# Patient Record
Sex: Female | Born: 1935 | Race: White | Hispanic: No | Marital: Married | Smoking: Never smoker
Health system: Southern US, Community
[De-identification: ages and names within clinical notes are randomized; demographics above are authoritative.]

## PROBLEM LIST (undated history)

## (undated) DIAGNOSIS — M1990 Unspecified osteoarthritis, unspecified site: Secondary | ICD-10-CM

## (undated) DIAGNOSIS — K589 Irritable bowel syndrome without diarrhea: Secondary | ICD-10-CM

## (undated) DIAGNOSIS — H3530 Unspecified macular degeneration: Secondary | ICD-10-CM

## (undated) DIAGNOSIS — I10 Essential (primary) hypertension: Secondary | ICD-10-CM

## (undated) DIAGNOSIS — E039 Hypothyroidism, unspecified: Secondary | ICD-10-CM

## (undated) DIAGNOSIS — E785 Hyperlipidemia, unspecified: Secondary | ICD-10-CM

## (undated) DIAGNOSIS — K219 Gastro-esophageal reflux disease without esophagitis: Secondary | ICD-10-CM

## (undated) DIAGNOSIS — Z8719 Personal history of other diseases of the digestive system: Secondary | ICD-10-CM

## (undated) HISTORY — PX: EYE SURGERY: SHX253

## (undated) HISTORY — PX: CATARACT EXTRACTION: SUR2

## (undated) HISTORY — DX: Unspecified macular degeneration: H35.30

## (undated) HISTORY — PX: THYROID SURGERY: SHX805

## (undated) HISTORY — PX: FRACTURE SURGERY: SHX138

---

## 1999-11-27 ENCOUNTER — Other Ambulatory Visit: Admission: RE | Admit: 1999-11-27 | Discharge: 1999-11-27 | Payer: Self-pay | Admitting: *Deleted

## 2001-02-15 ENCOUNTER — Other Ambulatory Visit: Admission: RE | Admit: 2001-02-15 | Discharge: 2001-02-15 | Payer: Self-pay | Admitting: *Deleted

## 2003-02-09 ENCOUNTER — Other Ambulatory Visit: Admission: RE | Admit: 2003-02-09 | Discharge: 2003-02-09 | Payer: Self-pay | Admitting: Family Medicine

## 2004-02-12 ENCOUNTER — Other Ambulatory Visit: Admission: RE | Admit: 2004-02-12 | Discharge: 2004-02-12 | Payer: Self-pay | Admitting: Family Medicine

## 2004-02-26 ENCOUNTER — Encounter: Admission: RE | Admit: 2004-02-26 | Discharge: 2004-02-26 | Payer: Self-pay | Admitting: Family Medicine

## 2004-04-11 ENCOUNTER — Ambulatory Visit (HOSPITAL_COMMUNITY): Admission: RE | Admit: 2004-04-11 | Discharge: 2004-04-11 | Payer: Self-pay | Admitting: Gastroenterology

## 2004-05-06 ENCOUNTER — Encounter: Admission: RE | Admit: 2004-05-06 | Discharge: 2004-05-06 | Payer: Self-pay | Admitting: Gastroenterology

## 2005-05-08 ENCOUNTER — Encounter: Admission: RE | Admit: 2005-05-08 | Discharge: 2005-05-08 | Payer: Self-pay | Admitting: Gastroenterology

## 2006-09-24 ENCOUNTER — Encounter: Admission: RE | Admit: 2006-09-24 | Discharge: 2006-09-24 | Payer: Self-pay | Admitting: Gastroenterology

## 2008-01-10 ENCOUNTER — Encounter: Admission: RE | Admit: 2008-01-10 | Discharge: 2008-01-10 | Payer: Self-pay | Admitting: Family Medicine

## 2009-11-23 ENCOUNTER — Inpatient Hospital Stay (HOSPITAL_COMMUNITY): Admission: EM | Admit: 2009-11-23 | Discharge: 2009-11-25 | Payer: Self-pay | Admitting: Emergency Medicine

## 2010-05-14 ENCOUNTER — Encounter: Admission: RE | Admit: 2010-05-14 | Discharge: 2010-05-14 | Payer: Self-pay | Admitting: Orthopedic Surgery

## 2011-02-02 LAB — COMPREHENSIVE METABOLIC PANEL
ALT: 127 U/L — ABNORMAL HIGH (ref 0–35)
AST: 89 U/L — ABNORMAL HIGH (ref 0–37)
Albumin: 3.9 g/dL (ref 3.5–5.2)
Alkaline Phosphatase: 58 U/L (ref 39–117)
BUN: 8 mg/dL (ref 6–23)
CO2: 30 mEq/L (ref 19–32)
Calcium: 8.6 mg/dL (ref 8.4–10.5)
Chloride: 83 mEq/L — ABNORMAL LOW (ref 96–112)
Creatinine, Ser: 0.87 mg/dL (ref 0.4–1.2)
GFR calc Af Amer: >60 mL/min (ref >60)
GFR calc non Af Amer: >60 mL/min (ref >60)
Glucose, Bld: 122 mg/dL — ABNORMAL HIGH (ref 70–99)
Potassium: 2.9 mEq/L — ABNORMAL LOW (ref 3.5–5.1)
Sodium: 122 mEq/L — ABNORMAL LOW (ref 135–145)
Total Bilirubin: 0.8 mg/dL (ref 0.3–1.2)
Total Protein: 7.4 g/dL (ref 6.0–8.3)

## 2011-02-02 LAB — URINALYSIS, ROUTINE W REFLEX MICROSCOPIC
Bilirubin Urine: NEGATIVE
Glucose, UA: NEGATIVE mg/dL
Hgb urine dipstick: NEGATIVE
Ketones, ur: NEGATIVE mg/dL
Nitrite: NEGATIVE
Protein, ur: NEGATIVE mg/dL
Specific Gravity, Urine: 1.005 (ref 1.005–1.030)
Urobilinogen, UA: 0.2 mg/dL (ref 0.0–1.0)
pH: 7 (ref 5.0–8.0)

## 2011-02-02 LAB — BASIC METABOLIC PANEL
BUN: 4 mg/dL — ABNORMAL LOW (ref 6–23)
CO2: 28 mEq/L (ref 19–32)
Calcium: 7.1 mg/dL — ABNORMAL LOW (ref 8.4–10.5)
Chloride: 105 mEq/L (ref 96–112)
Creatinine, Ser: 0.75 mg/dL (ref 0.4–1.2)
GFR calc Af Amer: >60 mL/min (ref >60)
GFR calc non Af Amer: >60 mL/min (ref >60)
Glucose, Bld: 103 mg/dL — ABNORMAL HIGH (ref 70–99)
Potassium: 4.9 mEq/L (ref 3.5–5.1)
Sodium: 134 mEq/L — ABNORMAL LOW (ref 135–145)

## 2011-02-02 LAB — CBC
HCT: 43.9 % (ref 36.0–46.0)
Hemoglobin: 15.2 g/dL — ABNORMAL HIGH (ref 12.0–15.0)
MCHC: 34.5 g/dL (ref 30.0–36.0)
MCV: 92.5 fL (ref 78.0–100.0)
Platelets: 162 10*3/uL (ref 150–400)
RBC: 4.75 MIL/uL (ref 3.87–5.11)
RDW: 12.6 % (ref 11.5–15.5)
WBC: 4.2 10*3/uL (ref 4.0–10.5)

## 2011-02-02 LAB — DIFFERENTIAL
Basophils Absolute: 0 10*3/uL (ref 0.0–0.1)
Basophils Relative: 1 % (ref 0–1)
Eosinophils Absolute: 0 10*3/uL (ref 0.0–0.7)
Eosinophils Relative: 0 % (ref 0–5)
Lymphocytes Relative: 20 % (ref 12–46)
Lymphs Abs: 0.8 10*3/uL (ref 0.7–4.0)
Monocytes Absolute: 0.7 10*3/uL (ref 0.1–1.0)
Monocytes Relative: 16 % — ABNORMAL HIGH (ref 3–12)
Neutro Abs: 2.7 10*3/uL (ref 1.7–7.7)
Neutrophils Relative %: 63 % (ref 43–77)

## 2011-02-02 LAB — MAGNESIUM: Magnesium: 2 mg/dL (ref 1.5–2.5)

## 2011-04-04 NOTE — Op Note (Signed)
Tammy Allison, Tammy Allison                       ACCOUNT NO.:  [id number]   MEDICAL RECORD NO.:  [id number]                   PATIENT TYPE:  AMB   LOCATION:  ENDO                                 FACILITY:  [facility]   PHYSICIAN:  Graylin Shiver, M.D.                DATE OF BIRTH:  Oct 29, 1935   DATE OF PROCEDURE:  04/11/2004  DATE OF DISCHARGE:                                 OPERATIVE REPORT   PROCEDURE:  Flexible sigmoidoscopy.   INDICATIONS FOR PROCEDURE:  Screening.   CONSENT:  Informed consent was obtained after explanation of the risks of  bleeding, infection, and perforation.   PREMEDICATION:  Fentanyl 75 mcg IV, Versed 7.5 mg IV.   PROCEDURE IN DETAIL:  With the patient in the left lateral decubitus  position, a rectal exam was performed and no masses were felt.  The Olympus  pediatric adjustable colonoscope was inserted into the rectum and advanced  into the sigmoid colon.  The sigmoid was extremely tortuous and fixed.  I  could not advance the scope beyond 25 cm.  The patient was rolled on her  back and pressure was applied to the sigmoid area.  I could not advance the  scope any further.  The colonoscope was removed.  I then tried a gastroscope  but cold not advance the scope beyond 25 cm in the sigmoid.  Some  diverticula were seen.  The sigmoid is tortuous and fixed.  Other than  diverticula, no other abnormalities were noted.  She tolerated the procedure  well without complications.   IMPRESSION:  Flexible sigmoidoscopy to 25 cm revealed diverticulosis.  The  sigmoid is fixed and tortuous.  The scope cannot be advanced beyond 25 cm.   PLAN:  Barium enema to evaluate the rest of the colon.                                               Graylin Shiver, M.D.    Germain Osgood  D:  04/11/2004  T:  04/11/2004  Job:  [id number]

## 2011-04-04 NOTE — Op Note (Signed)
NAMERAJANAE, MANTIA             ACCOUNT NO.:  [id number]   MEDICAL RECORD NO.:  [id number]          PATIENT TYPE:  AMB   LOCATION:  ENDO                         FACILITY:  [facility]   PHYSICIAN:  Kathlee Nations Trigt III, M.D.DATE OF BIRTH:  04-02-1935   DATE OF PROCEDURE:  10/07/2004  DATE OF DISCHARGE:  04/11/2004                                 OPERATIVE REPORT   OPERATION:  Placement of left chest tube.   PREOPERATIVE DIAGNOSIS:  Recurrent pneumothorax following placement of a  left chest DART catheter for a large pneumothorax on November 19.   POSTOPERATIVE DIAGNOSIS:  Recurrent pneumothorax following placement of a  left chest DART catheter for a large pneumothorax on November 19.   SURGEON:  Kerin Perna, M.D.   ANESTHESIA:  Local 1% lidocaine.   INDICATIONS FOR PROCEDURE:  The patient is a 76 year old female who had an ICD  placed on November 18.  His initial postoperative x-ray showed good lead  placement without pneumothorax.  His chest x-ray the next morning showed a  90% left pneumothorax from which he was symptomatic.  An anterior DART  catheter was placed with full re-expansion of the lung and minimal air leak.  Yesterday, the small catheter was placed to water-seal inferiorly but he  then became symptomatic with shortness of breath.  He was placed back to  suction with improved symptoms, however, a chest x-ray this morning showed a  recurrent inferior and superior pneumothorax of approximately 25%.  For that  reason, it was felt a wider chest tube would be necessary to evacuate the  pneumothorax and to resolve the air leak.  I discussed the situation with  the patient and his wife and they understood and agreed to proceed under  informed consent.   PROCEDURE:  The previously placed DART catheter on the anterior chest was  prepped and removed.  The anterolateral left chest was then prepped and  draped as a sterile field.  1% lidocaine was infiltrated in the fifth  anterior intercostal space.  A small incision was made of 1 cm and a  subcutaneous tunnel was tracked to the intercostal muscle.  The intercostal  muscle was infiltrated with 1% lidocaine.  A hemostat was used to gently  enter the pleural space which was confirmed by the rush of air externally.  Through the tunnel, a 20 French chest tube was placed and directed laterally  and anteriorly to the apex.  This was connected to the PleurVac suction  drainage system and there was good air fluctuation.  The catheter was  sutured in place with two silk sutures and a sterile dressing was applied.  A portable chest x-ray is pending.      Pete   PV/MEDQ  D:  10/07/2004  T:  10/07/2004  Job:  [id number]

## 2011-10-06 ENCOUNTER — Other Ambulatory Visit: Payer: Self-pay | Admitting: Gastroenterology

## 2011-10-27 ENCOUNTER — Other Ambulatory Visit: Payer: Self-pay

## 2011-10-28 ENCOUNTER — Ambulatory Visit
Admission: RE | Admit: 2011-10-28 | Discharge: 2011-10-28 | Disposition: A | Discharge status: Home / Self Care | Payer: Medicare Other | Source: Ambulatory Visit | Attending: Gastroenterology | Admitting: Gastroenterology

## 2011-12-11 DIAGNOSIS — Z1231 Encounter for screening mammogram for malignant neoplasm of breast: Secondary | ICD-10-CM | POA: Diagnosis not present

## 2012-01-12 DIAGNOSIS — H353290 Exudative age-related macular degeneration, unspecified eye, stage unspecified: Secondary | ICD-10-CM | POA: Diagnosis not present

## 2012-01-12 DIAGNOSIS — H2510 Age-related nuclear cataract, unspecified eye: Secondary | ICD-10-CM | POA: Diagnosis not present

## 2012-01-12 DIAGNOSIS — H43399 Other vitreous opacities, unspecified eye: Secondary | ICD-10-CM | POA: Diagnosis not present

## 2012-01-12 DIAGNOSIS — H40039 Anatomical narrow angle, unspecified eye: Secondary | ICD-10-CM | POA: Diagnosis not present

## 2012-03-02 DIAGNOSIS — J309 Allergic rhinitis, unspecified: Secondary | ICD-10-CM | POA: Diagnosis not present

## 2012-03-02 DIAGNOSIS — I10 Essential (primary) hypertension: Secondary | ICD-10-CM | POA: Diagnosis not present

## 2012-03-02 DIAGNOSIS — E039 Hypothyroidism, unspecified: Secondary | ICD-10-CM | POA: Diagnosis not present

## 2012-03-02 DIAGNOSIS — E7800 Pure hypercholesterolemia, unspecified: Secondary | ICD-10-CM | POA: Diagnosis not present

## 2012-04-13 DIAGNOSIS — R55 Syncope and collapse: Secondary | ICD-10-CM | POA: Diagnosis not present

## 2012-04-20 DIAGNOSIS — I10 Essential (primary) hypertension: Secondary | ICD-10-CM | POA: Diagnosis not present

## 2012-04-20 DIAGNOSIS — E7800 Pure hypercholesterolemia, unspecified: Secondary | ICD-10-CM | POA: Diagnosis not present

## 2012-04-20 DIAGNOSIS — R55 Syncope and collapse: Secondary | ICD-10-CM | POA: Diagnosis not present

## 2012-05-04 DIAGNOSIS — R002 Palpitations: Secondary | ICD-10-CM | POA: Diagnosis not present

## 2012-05-04 DIAGNOSIS — M7130 Other bursal cyst, unspecified site: Secondary | ICD-10-CM | POA: Diagnosis not present

## 2012-07-12 DIAGNOSIS — H40039 Anatomical narrow angle, unspecified eye: Secondary | ICD-10-CM | POA: Diagnosis not present

## 2012-08-30 DIAGNOSIS — M949 Disorder of cartilage, unspecified: Secondary | ICD-10-CM | POA: Diagnosis not present

## 2012-08-30 DIAGNOSIS — I10 Essential (primary) hypertension: Secondary | ICD-10-CM | POA: Diagnosis not present

## 2012-08-30 DIAGNOSIS — E039 Hypothyroidism, unspecified: Secondary | ICD-10-CM | POA: Diagnosis not present

## 2012-08-30 DIAGNOSIS — E7800 Pure hypercholesterolemia, unspecified: Secondary | ICD-10-CM | POA: Diagnosis not present

## 2012-08-30 DIAGNOSIS — J309 Allergic rhinitis, unspecified: Secondary | ICD-10-CM | POA: Diagnosis not present

## 2012-08-30 DIAGNOSIS — K219 Gastro-esophageal reflux disease without esophagitis: Secondary | ICD-10-CM | POA: Diagnosis not present

## 2012-08-30 DIAGNOSIS — M899 Disorder of bone, unspecified: Secondary | ICD-10-CM | POA: Diagnosis not present

## 2012-12-23 DIAGNOSIS — Z1231 Encounter for screening mammogram for malignant neoplasm of breast: Secondary | ICD-10-CM | POA: Diagnosis not present

## 2013-01-13 DIAGNOSIS — H40039 Anatomical narrow angle, unspecified eye: Secondary | ICD-10-CM | POA: Diagnosis not present

## 2013-01-13 DIAGNOSIS — H04129 Dry eye syndrome of unspecified lacrimal gland: Secondary | ICD-10-CM | POA: Diagnosis not present

## 2013-01-13 DIAGNOSIS — H2510 Age-related nuclear cataract, unspecified eye: Secondary | ICD-10-CM | POA: Diagnosis not present

## 2013-01-13 DIAGNOSIS — H43819 Vitreous degeneration, unspecified eye: Secondary | ICD-10-CM | POA: Diagnosis not present

## 2013-01-13 DIAGNOSIS — H353190 Nonexudative age-related macular degeneration, unspecified eye, stage unspecified: Secondary | ICD-10-CM | POA: Diagnosis not present

## 2013-02-28 DIAGNOSIS — E039 Hypothyroidism, unspecified: Secondary | ICD-10-CM | POA: Diagnosis not present

## 2013-02-28 DIAGNOSIS — I10 Essential (primary) hypertension: Secondary | ICD-10-CM | POA: Diagnosis not present

## 2013-02-28 DIAGNOSIS — E7800 Pure hypercholesterolemia, unspecified: Secondary | ICD-10-CM | POA: Diagnosis not present

## 2013-02-28 DIAGNOSIS — J309 Allergic rhinitis, unspecified: Secondary | ICD-10-CM | POA: Diagnosis not present

## 2013-02-28 DIAGNOSIS — K219 Gastro-esophageal reflux disease without esophagitis: Secondary | ICD-10-CM | POA: Diagnosis not present

## 2013-03-23 DIAGNOSIS — H1045 Other chronic allergic conjunctivitis: Secondary | ICD-10-CM | POA: Diagnosis not present

## 2013-03-23 DIAGNOSIS — H00019 Hordeolum externum unspecified eye, unspecified eyelid: Secondary | ICD-10-CM | POA: Diagnosis not present

## 2013-04-04 DIAGNOSIS — H40039 Anatomical narrow angle, unspecified eye: Secondary | ICD-10-CM | POA: Diagnosis not present

## 2013-04-04 DIAGNOSIS — H1045 Other chronic allergic conjunctivitis: Secondary | ICD-10-CM | POA: Diagnosis not present

## 2013-04-04 DIAGNOSIS — H04129 Dry eye syndrome of unspecified lacrimal gland: Secondary | ICD-10-CM | POA: Diagnosis not present

## 2013-04-21 DIAGNOSIS — E039 Hypothyroidism, unspecified: Secondary | ICD-10-CM | POA: Diagnosis not present

## 2013-04-28 DIAGNOSIS — L508 Other urticaria: Secondary | ICD-10-CM | POA: Diagnosis not present

## 2013-05-25 DIAGNOSIS — R059 Cough, unspecified: Secondary | ICD-10-CM | POA: Diagnosis not present

## 2013-05-25 DIAGNOSIS — R05 Cough: Secondary | ICD-10-CM | POA: Diagnosis not present

## 2013-05-25 DIAGNOSIS — R0789 Other chest pain: Secondary | ICD-10-CM | POA: Diagnosis not present

## 2013-05-25 DIAGNOSIS — R0989 Other specified symptoms and signs involving the circulatory and respiratory systems: Secondary | ICD-10-CM | POA: Diagnosis not present

## 2013-05-25 DIAGNOSIS — R0609 Other forms of dyspnea: Secondary | ICD-10-CM | POA: Diagnosis not present

## 2013-06-06 DIAGNOSIS — R0989 Other specified symptoms and signs involving the circulatory and respiratory systems: Secondary | ICD-10-CM | POA: Diagnosis not present

## 2013-06-06 DIAGNOSIS — R0609 Other forms of dyspnea: Secondary | ICD-10-CM | POA: Diagnosis not present

## 2013-06-06 DIAGNOSIS — R0789 Other chest pain: Secondary | ICD-10-CM | POA: Diagnosis not present

## 2013-06-16 DIAGNOSIS — L509 Urticaria, unspecified: Secondary | ICD-10-CM | POA: Diagnosis not present

## 2013-08-04 DIAGNOSIS — H40039 Anatomical narrow angle, unspecified eye: Secondary | ICD-10-CM | POA: Diagnosis not present

## 2013-08-04 DIAGNOSIS — H04129 Dry eye syndrome of unspecified lacrimal gland: Secondary | ICD-10-CM | POA: Diagnosis not present

## 2013-08-04 DIAGNOSIS — H1045 Other chronic allergic conjunctivitis: Secondary | ICD-10-CM | POA: Diagnosis not present

## 2013-08-04 DIAGNOSIS — H18519 Endothelial corneal dystrophy, unspecified eye: Secondary | ICD-10-CM | POA: Diagnosis not present

## 2013-09-01 DIAGNOSIS — I10 Essential (primary) hypertension: Secondary | ICD-10-CM | POA: Diagnosis not present

## 2013-09-01 DIAGNOSIS — R197 Diarrhea, unspecified: Secondary | ICD-10-CM | POA: Diagnosis not present

## 2013-09-01 DIAGNOSIS — K219 Gastro-esophageal reflux disease without esophagitis: Secondary | ICD-10-CM | POA: Diagnosis not present

## 2013-09-01 DIAGNOSIS — E7800 Pure hypercholesterolemia, unspecified: Secondary | ICD-10-CM | POA: Diagnosis not present

## 2013-09-01 DIAGNOSIS — E039 Hypothyroidism, unspecified: Secondary | ICD-10-CM | POA: Diagnosis not present

## 2013-12-26 DIAGNOSIS — Z1231 Encounter for screening mammogram for malignant neoplasm of breast: Secondary | ICD-10-CM | POA: Diagnosis not present

## 2014-01-20 DIAGNOSIS — H524 Presbyopia: Secondary | ICD-10-CM | POA: Diagnosis not present

## 2014-01-20 DIAGNOSIS — H353190 Nonexudative age-related macular degeneration, unspecified eye, stage unspecified: Secondary | ICD-10-CM | POA: Diagnosis not present

## 2014-01-20 DIAGNOSIS — H18519 Endothelial corneal dystrophy, unspecified eye: Secondary | ICD-10-CM | POA: Diagnosis not present

## 2014-01-20 DIAGNOSIS — H2510 Age-related nuclear cataract, unspecified eye: Secondary | ICD-10-CM | POA: Diagnosis not present

## 2014-01-20 DIAGNOSIS — H5703 Miosis: Secondary | ICD-10-CM | POA: Diagnosis not present

## 2014-01-20 DIAGNOSIS — H25019 Cortical age-related cataract, unspecified eye: Secondary | ICD-10-CM | POA: Diagnosis not present

## 2014-03-02 DIAGNOSIS — E039 Hypothyroidism, unspecified: Secondary | ICD-10-CM | POA: Diagnosis not present

## 2014-03-02 DIAGNOSIS — Z23 Encounter for immunization: Secondary | ICD-10-CM | POA: Diagnosis not present

## 2014-03-02 DIAGNOSIS — E7800 Pure hypercholesterolemia, unspecified: Secondary | ICD-10-CM | POA: Diagnosis not present

## 2014-03-02 DIAGNOSIS — M949 Disorder of cartilage, unspecified: Secondary | ICD-10-CM | POA: Diagnosis not present

## 2014-03-02 DIAGNOSIS — M899 Disorder of bone, unspecified: Secondary | ICD-10-CM | POA: Diagnosis not present

## 2014-03-02 DIAGNOSIS — I10 Essential (primary) hypertension: Secondary | ICD-10-CM | POA: Diagnosis not present

## 2014-03-02 DIAGNOSIS — J309 Allergic rhinitis, unspecified: Secondary | ICD-10-CM | POA: Diagnosis not present

## 2014-03-21 DIAGNOSIS — H2510 Age-related nuclear cataract, unspecified eye: Secondary | ICD-10-CM | POA: Diagnosis not present

## 2014-03-21 DIAGNOSIS — H269 Unspecified cataract: Secondary | ICD-10-CM | POA: Diagnosis not present

## 2014-04-18 DIAGNOSIS — M25559 Pain in unspecified hip: Secondary | ICD-10-CM | POA: Diagnosis not present

## 2014-04-19 DIAGNOSIS — H2510 Age-related nuclear cataract, unspecified eye: Secondary | ICD-10-CM | POA: Diagnosis not present

## 2014-04-19 DIAGNOSIS — H25019 Cortical age-related cataract, unspecified eye: Secondary | ICD-10-CM | POA: Diagnosis not present

## 2014-04-21 ENCOUNTER — Other Ambulatory Visit: Payer: Self-pay | Admitting: Family Medicine

## 2014-04-21 ENCOUNTER — Ambulatory Visit
Admission: RE | Admit: 2014-04-21 | Discharge: 2014-04-21 | Disposition: A | Discharge status: Home / Self Care | Payer: Medicare Other | Source: Ambulatory Visit | Attending: Family Medicine | Admitting: Family Medicine

## 2014-04-21 DIAGNOSIS — M79605 Pain in left leg: Secondary | ICD-10-CM

## 2014-04-21 DIAGNOSIS — M545 Low back pain, unspecified: Secondary | ICD-10-CM

## 2014-04-21 DIAGNOSIS — M25559 Pain in unspecified hip: Secondary | ICD-10-CM | POA: Diagnosis not present

## 2014-05-01 DIAGNOSIS — M545 Low back pain, unspecified: Secondary | ICD-10-CM | POA: Diagnosis not present

## 2014-05-01 DIAGNOSIS — M25559 Pain in unspecified hip: Secondary | ICD-10-CM | POA: Diagnosis not present

## 2014-05-02 DIAGNOSIS — H2510 Age-related nuclear cataract, unspecified eye: Secondary | ICD-10-CM | POA: Diagnosis not present

## 2014-05-02 DIAGNOSIS — H269 Unspecified cataract: Secondary | ICD-10-CM | POA: Diagnosis not present

## 2014-05-05 DIAGNOSIS — M545 Low back pain, unspecified: Secondary | ICD-10-CM | POA: Diagnosis not present

## 2014-05-05 DIAGNOSIS — M25559 Pain in unspecified hip: Secondary | ICD-10-CM | POA: Diagnosis not present

## 2014-05-09 DIAGNOSIS — M545 Low back pain, unspecified: Secondary | ICD-10-CM | POA: Diagnosis not present

## 2014-05-09 DIAGNOSIS — M25559 Pain in unspecified hip: Secondary | ICD-10-CM | POA: Diagnosis not present

## 2014-05-11 DIAGNOSIS — M545 Low back pain, unspecified: Secondary | ICD-10-CM | POA: Diagnosis not present

## 2014-05-11 DIAGNOSIS — M25559 Pain in unspecified hip: Secondary | ICD-10-CM | POA: Diagnosis not present

## 2014-05-12 ENCOUNTER — Ambulatory Visit
Admission: RE | Admit: 2014-05-12 | Discharge: 2014-05-12 | Disposition: A | Discharge status: Home / Self Care | Payer: Medicare Other | Source: Ambulatory Visit | Attending: Family Medicine | Admitting: Family Medicine

## 2014-05-12 ENCOUNTER — Inpatient Hospital Stay (HOSPITAL_COMMUNITY)
Admission: EM | Admit: 2014-05-12 | Discharge: 2014-05-15 | DRG: 641 | Disposition: A | Discharge status: Home Health | Payer: Medicare Other | Attending: Internal Medicine | Admitting: Internal Medicine

## 2014-05-12 ENCOUNTER — Other Ambulatory Visit: Payer: Self-pay | Admitting: Family Medicine

## 2014-05-12 ENCOUNTER — Encounter (HOSPITAL_COMMUNITY): Payer: Self-pay | Admitting: Emergency Medicine

## 2014-05-12 DIAGNOSIS — E785 Hyperlipidemia, unspecified: Secondary | ICD-10-CM | POA: Diagnosis present

## 2014-05-12 DIAGNOSIS — R319 Hematuria, unspecified: Secondary | ICD-10-CM

## 2014-05-12 DIAGNOSIS — E871 Hypo-osmolality and hyponatremia: Principal | ICD-10-CM | POA: Diagnosis present

## 2014-05-12 DIAGNOSIS — K5730 Diverticulosis of large intestine without perforation or abscess without bleeding: Secondary | ICD-10-CM | POA: Diagnosis not present

## 2014-05-12 DIAGNOSIS — E878 Other disorders of electrolyte and fluid balance, not elsewhere classified: Secondary | ICD-10-CM | POA: Diagnosis not present

## 2014-05-12 DIAGNOSIS — M5137 Other intervertebral disc degeneration, lumbosacral region: Secondary | ICD-10-CM | POA: Diagnosis not present

## 2014-05-12 DIAGNOSIS — I10 Essential (primary) hypertension: Secondary | ICD-10-CM | POA: Diagnosis present

## 2014-05-12 DIAGNOSIS — IMO0002 Reserved for concepts with insufficient information to code with codable children: Secondary | ICD-10-CM | POA: Diagnosis present

## 2014-05-12 DIAGNOSIS — E038 Other specified hypothyroidism: Secondary | ICD-10-CM

## 2014-05-12 DIAGNOSIS — R110 Nausea: Secondary | ICD-10-CM | POA: Diagnosis not present

## 2014-05-12 DIAGNOSIS — E876 Hypokalemia: Secondary | ICD-10-CM | POA: Diagnosis present

## 2014-05-12 DIAGNOSIS — M549 Dorsalgia, unspecified: Secondary | ICD-10-CM | POA: Diagnosis not present

## 2014-05-12 DIAGNOSIS — M5417 Radiculopathy, lumbosacral region: Secondary | ICD-10-CM | POA: Diagnosis present

## 2014-05-12 DIAGNOSIS — M5126 Other intervertebral disc displacement, lumbar region: Secondary | ICD-10-CM | POA: Diagnosis not present

## 2014-05-12 DIAGNOSIS — R109 Unspecified abdominal pain: Secondary | ICD-10-CM

## 2014-05-12 DIAGNOSIS — E039 Hypothyroidism, unspecified: Secondary | ICD-10-CM | POA: Diagnosis present

## 2014-05-12 DIAGNOSIS — R82998 Other abnormal findings in urine: Secondary | ICD-10-CM | POA: Diagnosis not present

## 2014-05-12 HISTORY — DX: Hyperlipidemia, unspecified: E78.5

## 2014-05-12 HISTORY — DX: Essential (primary) hypertension: I10

## 2014-05-12 LAB — COMPREHENSIVE METABOLIC PANEL
ALT: 16 U/L (ref 0–35)
AST: 19 U/L (ref 0–37)
Albumin: 3.5 g/dL (ref 3.5–5.2)
Alkaline Phosphatase: 48 U/L (ref 39–117)
BUN: 8 mg/dL (ref 6–23)
CO2: 31 mEq/L (ref 19–32)
Calcium: 8.5 mg/dL (ref 8.4–10.5)
Chloride: 76 mEq/L — ABNORMAL LOW (ref 96–112)
Creatinine, Ser: 0.69 mg/dL (ref 0.50–1.10)
GFR calc Af Amer: >90 mL/min (ref >90)
GFR calc non Af Amer: 82 mL/min — ABNORMAL LOW (ref >90)
Glucose, Bld: 122 mg/dL — ABNORMAL HIGH (ref 70–99)
Potassium: <2.2 mEq/L — CL (ref 3.7–5.3)
Sodium: 118 mEq/L — CL (ref 137–147)
Total Bilirubin: 1 mg/dL (ref 0.3–1.2)
Total Protein: 6.3 g/dL (ref 6.0–8.3)

## 2014-05-12 LAB — CBC WITH DIFFERENTIAL/PLATELET
Basophils Absolute: 0 10*3/uL (ref 0.0–0.1)
Basophils Relative: 0 % (ref 0–1)
Eosinophils Absolute: 0 10*3/uL (ref 0.0–0.7)
Eosinophils Relative: 0 % (ref 0–5)
HCT: 38.1 % (ref 36.0–46.0)
Hemoglobin: 13.9 g/dL (ref 12.0–15.0)
Lymphocytes Relative: 17 % (ref 12–46)
Lymphs Abs: 1.4 10*3/uL (ref 0.7–4.0)
MCH: 31 pg (ref 26.0–34.0)
MCHC: 36.5 g/dL — ABNORMAL HIGH (ref 30.0–36.0)
MCV: 85 fL (ref 78.0–100.0)
Monocytes Absolute: 0.9 10*3/uL (ref 0.1–1.0)
Monocytes Relative: 11 % (ref 3–12)
Neutro Abs: 5.9 10*3/uL (ref 1.7–7.7)
Neutrophils Relative %: 72 % (ref 43–77)
Platelets: 186 10*3/uL (ref 150–400)
RBC: 4.48 MIL/uL (ref 3.87–5.11)
RDW: 12.4 % (ref 11.5–15.5)
WBC: 8.1 10*3/uL (ref 4.0–10.5)

## 2014-05-12 LAB — MAGNESIUM: Magnesium: 1.5 mg/dL (ref 1.5–2.5)

## 2014-05-12 MED ORDER — ALUM & MAG HYDROXIDE-SIMETH 200-200-20 MG/5ML PO SUSP: 30.0000 mL | Freq: Four times a day (QID) | ORAL | Status: DC | PRN

## 2014-05-12 MED ORDER — POTASSIUM CHLORIDE 10 MEQ/100ML IV SOLN
10.0000 meq | INTRAVENOUS | Status: AC
  Administered 2014-05-12 – 2014-05-13 (×6): 10 meq via INTRAVENOUS
  Filled 2014-05-12 (×11): qty 100

## 2014-05-12 MED ORDER — SODIUM CHLORIDE 0.9 % IJ SOLN: 3.0000 mL | Freq: Two times a day (BID) | INTRAMUSCULAR | Status: DC

## 2014-05-12 MED ORDER — SODIUM CHLORIDE 0.9 % IV SOLN: INTRAVENOUS | Status: DC

## 2014-05-12 MED ORDER — IOHEXOL 300 MG/ML  SOLN
100.0000 mL | Freq: Once | INTRAMUSCULAR | Status: AC | PRN
  Administered 2014-05-12: 100 mL via INTRAVENOUS

## 2014-05-12 MED ORDER — ENOXAPARIN SODIUM 30 MG/0.3ML ~~LOC~~ SOLN
30.0000 mg | SUBCUTANEOUS | Status: DC
  Administered 2014-05-12: 30 mg via SUBCUTANEOUS
  Filled 2014-05-12 (×2): qty 0.3

## 2014-05-12 MED ORDER — ONDANSETRON HCL 4 MG/2ML IJ SOLN
4.0000 mg | Freq: Four times a day (QID) | INTRAMUSCULAR | Status: DC | PRN
  Administered 2014-05-13 – 2014-05-15 (×2): 4 mg via INTRAVENOUS
  Filled 2014-05-12 (×2): qty 2

## 2014-05-12 MED ORDER — SODIUM CHLORIDE 0.9 % IV SOLN
INTRAVENOUS | Status: DC
  Administered 2014-05-12: via INTRAVENOUS

## 2014-05-12 MED ORDER — HYDROMORPHONE HCL PF 1 MG/ML IJ SOLN: 0.5000 mg | INTRAMUSCULAR | Status: DC | PRN

## 2014-05-12 MED ORDER — GI COCKTAIL ~~LOC~~
30.0000 mL | Freq: Once | ORAL | Status: AC
  Administered 2014-05-13: 30 mL via ORAL
  Filled 2014-05-12: qty 30

## 2014-05-12 MED ORDER — SIMVASTATIN 20 MG PO TABS
20.0000 mg | ORAL_TABLET | Freq: Every day | ORAL | Status: DC
  Administered 2014-05-12 – 2014-05-14 (×3): 20 mg via ORAL
  Filled 2014-05-12 (×4): qty 1

## 2014-05-12 MED ORDER — POTASSIUM CHLORIDE 10 MEQ/100ML IV SOLN
10.0000 meq | Freq: Once | INTRAVENOUS | Status: AC
  Administered 2014-05-12: 10 meq via INTRAVENOUS
  Filled 2014-05-12: qty 100

## 2014-05-12 MED ORDER — OXYCODONE HCL 5 MG PO TABS
5.0000 mg | ORAL_TABLET | ORAL | Status: DC | PRN
  Administered 2014-05-12 – 2014-05-14 (×3): 5 mg via ORAL
  Filled 2014-05-12 (×3): qty 1

## 2014-05-12 MED ORDER — ACETAMINOPHEN 650 MG RE SUPP: 650.0000 mg | Freq: Four times a day (QID) | RECTAL | Status: DC | PRN

## 2014-05-12 MED ORDER — BENAZEPRIL HCL 20 MG PO TABS
20.0000 mg | ORAL_TABLET | Freq: Every day | ORAL | Status: DC
  Administered 2014-05-13 – 2014-05-15 (×3): 20 mg via ORAL
  Filled 2014-05-12 (×3): qty 1

## 2014-05-12 MED ORDER — LEVOTHYROXINE SODIUM 50 MCG PO TABS
50.0000 ug | ORAL_TABLET | Freq: Every day | ORAL | Status: DC
  Administered 2014-05-13 – 2014-05-15 (×3): 50 ug via ORAL
  Filled 2014-05-12 (×4): qty 1

## 2014-05-12 MED ORDER — ACETAMINOPHEN 325 MG PO TABS
650.0000 mg | ORAL_TABLET | Freq: Four times a day (QID) | ORAL | Status: DC | PRN
  Administered 2014-05-15 (×2): 650 mg via ORAL
  Filled 2014-05-12 (×2): qty 2

## 2014-05-12 MED ORDER — ONDANSETRON HCL 4 MG/2ML IJ SOLN: 4.0000 mg | Freq: Three times a day (TID) | INTRAMUSCULAR | Status: AC | PRN

## 2014-05-12 MED ORDER — ONDANSETRON HCL 4 MG PO TABS: 4.0000 mg | ORAL_TABLET | Freq: Four times a day (QID) | ORAL | Status: DC | PRN

## 2014-05-12 MED ORDER — OMEGA-3-ACID ETHYL ESTERS 1 G PO CAPS
1.0000 g | ORAL_CAPSULE | Freq: Two times a day (BID) | ORAL | Status: DC
  Administered 2014-05-12 – 2014-05-15 (×5): 1 g via ORAL
  Filled 2014-05-12 (×7): qty 1

## 2014-05-12 MED ORDER — MAGNESIUM SULFATE IN D5W 10-5 MG/ML-% IV SOLN
1.0000 g | Freq: Once | INTRAVENOUS | Status: AC
  Administered 2014-05-13: 1 g via INTRAVENOUS
  Filled 2014-05-12 (×2): qty 100

## 2014-05-12 MED ORDER — VITAMIN D3 25 MCG (1000 UNIT) PO TABS
1000.0000 [IU] | ORAL_TABLET | Freq: Every day | ORAL | Status: DC
  Administered 2014-05-13 – 2014-05-15 (×3): 1000 [IU] via ORAL
  Filled 2014-05-12 (×3): qty 1

## 2014-05-12 MED ORDER — POTASSIUM CHLORIDE CRYS ER 20 MEQ PO TBCR
40.0000 meq | EXTENDED_RELEASE_TABLET | Freq: Once | ORAL | Status: AC
  Administered 2014-05-12: 40 meq via ORAL
  Filled 2014-05-12: qty 2

## 2014-05-12 NOTE — ED Notes (Signed)
Critical Na+ 118 & K+ <2.2 given to nurse Govan

## 2014-05-12 NOTE — Progress Notes (Signed)
  CARE MANAGEMENT ED NOTE 05/12/2014  Patient:  Tammy Allison, Tammy Allison   Account Number:  [id number]  Date Initiated:  05/12/2014  Documentation initiated by:  Livia Snellen  Subjective/Objective Assessment:   Patient presents to Ed with low potassium and low sodium     Subjective/Objective Assessment Detail:     Action/Plan:   Action/Plan Detail:   Anticipated DC Date:       Status Recommendation to Physician:   Result of Recommendation:    Other ED [facility]  Other  PCP issues    Choice offered to / List presented to:            Status of service:  Completed, signed off  ED Comments:   ED Comments Detail:  EDCM soke to patient at bedside.  Patient confirms her pcp is Dr. [name] of [facility].  System updated.

## 2014-05-12 NOTE — ED Notes (Signed)
MD notified of Critical lab results .

## 2014-05-12 NOTE — ED Notes (Signed)
Pt was sent here from PCP with c/o low potassium and sodium

## 2014-05-12 NOTE — ED Provider Notes (Signed)
CSN: [id number]     Arrival date & time 05/12/14  1752 History   First MD Initiated Contact with Patient 05/12/14 1900     Chief Complaint  Patient presents with  . Abnormal Lab      HPI Patient was sent to emergency room by her PCP for abnormal lab which were done today.  Patient was told that her serum sodium and serum potassium was low.  Patient's had similar admissions in the past but that was secondary to gastroenteritis.  Patient is being worked up for low back pain and abdominal pain.  She denies any urinary or fecal incontinence.  Denies any weakness or paralysis. Past Medical History  Diagnosis Date  . Hypertension    Past Surgical History  Procedure Laterality Date  . Thyroid surgery    . Fracture surgery      right hip   History reviewed. No pertinent family history. History  Substance Use Topics  . Smoking status: Never Smoker   . Smokeless tobacco: Never Used  . Alcohol Use: No   OB History   Grav Para Term Preterm Abortions TAB SAB Ect Mult Living                 Review of Systems  All other systems reviewed and are negative  Allergies  Sulfa antibiotics  Home Medications   Prior to Admission medications   Medication Sig Start Date End Date Taking? Authorizing Provider  acetaminophen (TYLENOL) 325 MG tablet Take 650 mg by mouth every 6 (six) hours as needed (PAIN).   Yes Historical Provider, MD  benazepril (LOTENSIN) 20 MG tablet Take 20 mg by mouth daily.   Yes Historical Provider, MD  cholecalciferol (VITAMIN D) 1000 UNITS tablet Take 1,000 Units by mouth daily.   Yes Historical Provider, MD  HYDROcodone-acetaminophen (NORCO/VICODIN) 5-325 MG per tablet Take 1 tablet by mouth every 6 (six) hours as needed for moderate pain (PAIN).   Yes Historical Provider, MD  levothyroxine (SYNTHROID, LEVOTHROID) 50 MCG tablet Take 50 mcg by mouth daily before breakfast.   Yes Historical Provider, MD  omega-3 acid ethyl esters (LOVAZA) 1 G capsule Take 1 g by mouth 2  (two) times daily.   Yes Historical Provider, MD  simvastatin (ZOCOR) 20 MG tablet Take 20 mg by mouth at bedtime.   Yes Historical Provider, MD   BP 174/74  Pulse 84  Temp(Src) 98 F (36.7 C) (Oral)  Resp 22  SpO2 98% Physical Exam  Nursing note and vitals reviewed. Constitutional: She is oriented to person, place, and time. She appears well-developed and well-nourished. No distress.  HENT:  Head: Normocephalic and atraumatic.  Eyes: Pupils are equal, round, and reactive to light.  Neck: Normal range of motion.  Cardiovascular: Normal rate and intact distal pulses.   Pulmonary/Chest: No respiratory distress.  Abdominal: Normal appearance. She exhibits no distension.  Musculoskeletal: Normal range of motion.       Back:  Neurological: She is alert and oriented to person, place, and time. No cranial nerve deficit.  Skin: Skin is warm and dry. No rash noted.  Psychiatric: She has a normal mood and affect. Her behavior is normal.    ED Course  Procedures (including critical care time) Labs Review Labs Reviewed  COMPREHENSIVE METABOLIC PANEL - Abnormal; Notable for the following:    Sodium 118 (*)    Potassium <2.2 (*)    Chloride 76 (*)    Glucose, Bld 122 (*)    GFR calc non  Af Amer 82 (*)    All other components within normal limits  CBC WITH DIFFERENTIAL - Abnormal; Notable for the following:    MCHC 36.5 (*)    All other components within normal limits  MAGNESIUM    Imaging Review Ct Abdomen Pelvis W Contrast  05/12/2014   CLINICAL DATA:  Left-sided back pain and nausea.  Microhematuria.  BUN and creatinine were obtained on site at [place] at [address]Results: BUN 9 mg/dL, Creatinine 0.8 mg/dL.  EXAM: CT ABDOMEN AND PELVIS WITH CONTRAST  TECHNIQUE: Multidetector CT imaging of the abdomen and pelvis was performed using the standard protocol following bolus administration of intravenous contrast.  CONTRAST:  187mL OMNIPAQUE IOHEXOL 300 MG/ML  SOLN   COMPARISON:  CT scan dated 09/14/2006  FINDINGS: The patient has a large extruded disc fragment behind the body of L3 extending into the left lateral recess and left neural foramen. This should compress the left L3 nerve. It could have originated from the L2-3 or L3-4 disc space. This was not present on the prior exam of 10/28/2011 and may be the cause of the patient's left-sided back pain.  The liver, spleen, pancreas, adrenal glands, and kidneys demonstrate no significant abnormalities. There are 2 small benign cysts in the liver, 1 in the lateral segment of the left lobe adjacent to the gallbladder, 8 mm, and 1 in the posterior aspect of the dome of the right lobe of the liver, 5 mm.  Biliary tree is normal. There are numerous diverticula in the distal colon but there is no diverticulitis. Terminal ileum and appendix appear normal. Extensive calcification in the abdominal aorta and proximal renal arteries. Uterus and ovaries are normal. The bladder is empty. No acute osseous abnormalities.  IMPRESSION: 1. Large extruded soft disc fragment lying behind the left side of L3 compressing the left L3 nerve in the thecal sac. This could have originated from L2-3 or L3-4. 2. Extensive diverticulosis of the sigmoid portion of the colon without diverticulitis.   Electronically Signed   By: Rozetta Nunnery M.D.   On: 05/12/2014 17:18    I discussed the case with neurosurgery and described the CT results of a free fragment induced extrusion.  He stated patient could be followed as an outpatient in the office once her electrolytes have been stabilized.   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

## 2014-05-12 NOTE — H&P (Signed)
[facility] History and Physical  Tammy Allison FAO:[id number] DOB: 02/11/1935 DOA: 05/12/2014  Referring physician: EDP PCP: [name], MD  Specialists:   Chief Complaint: Abnormal Labs  HPI: Tammy Allison is a 79 y.o. female who was seen by her PCP and sent for a CT scan of the ABD and Pelvis due to a 2 week history of Lower back pain and findings of microscopic hematuria in the office on UA.   Labs were ordered as well and returned with abnormal findings of critically low potassium and sodium levels so she was referred to the ED for evalaution and admission.   Her repeat potassium levels was found to be < 2.2, and her sodium level was found to be 118.      Review of Systems:  Constitutional: No Weight Loss, No Weight Gain, Night Sweats, Fevers, Chills, +Fatigue, +Malaise, +Generalized Weakness HEENT: No Headaches, Difficulty Swallowing,Tooth/Dental Problems,Sore Throat,  No Sneezing, Rhinitis, Ear Ache, Nasal Congestion, or Post Nasal Drip,  Cardio-vascular:  No Chest pain, Orthopnea, PND, Edema in lower extremities, Anasarca, Dizziness, Palpitations  Resp: No Dyspnea, No DOE, No Cough, No Hemoptysis, No Wheezing.    GI: No Heartburn, Indigestion, Abdominal Pain, +Nausea, Vomiting, Diarrhea, Change in Bowel Habits,  +Loss of Appetite  GU: No Dysuria, Change in Color of Urine, No Urgency or Frequency.  No flank pain.  Musculoskeletal: No Joint Pain or Swelling.  No Decreased Range of Motion. No Back Pain.  Neurologic: No Syncope, No Seizures, +Muscle Weakness, Paresthesia, Vision Disturbance or Loss, No Diplopia, No Vertigo, No Difficulty Walking,  Skin: No Rash or Lesions. Psych: No Change in Mood or Affect. No Depression or Anxiety. No Memory loss. No Confusion or Hallucinations   Past Medical History  Diagnosis Date  . Hypertension   . Hyperlipidemia        Hypothyroid   Past Surgical History  Procedure Laterality Date  . Thyroid surgery    . Fracture  surgery      right hip     Prior to Admission medications   Medication Sig Start Date End Date Taking? Authorizing Provider  acetaminophen (TYLENOL) 325 MG tablet Take 650 mg by mouth every 6 (six) hours as needed (PAIN).   Yes Historical Provider, MD  benazepril (LOTENSIN) 20 MG tablet Take 20 mg by mouth daily.   Yes Historical Provider, MD  cholecalciferol (VITAMIN D) 1000 UNITS tablet Take 1,000 Units by mouth daily.   Yes Historical Provider, MD  HYDROcodone-acetaminophen (NORCO/VICODIN) 5-325 MG per tablet Take 1 tablet by mouth every 6 (six) hours as needed for moderate pain (PAIN).   Yes Historical Provider, MD  levothyroxine (SYNTHROID, LEVOTHROID) 50 MCG tablet Take 50 mcg by mouth daily before breakfast.   Yes Historical Provider, MD  omega-3 acid ethyl esters (LOVAZA) 1 G capsule Take 1 g by mouth 2 (two) times daily.   Yes Historical Provider, MD  simvastatin (ZOCOR) 20 MG tablet Take 20 mg by mouth at bedtime.   Yes Historical Provider, MD    Allergies  Allergen Reactions  . Sulfa Antibiotics Hives    Social History:  reports that she has never smoked. She has never used smokeless tobacco. She reports that she does not drink alcohol or use illicit drugs.     History reviewed. No pertinent family history.     Physical Exam:  GEN:  Pleasant Well Nourished and Well Developed  79 y.o. Caucasian female examined and in no acute distress; cooperative with exam Filed Vitals:  05/12/14 1915 05/12/14 2000 05/12/14 2030 05/12/14 2101  BP:    174/74  Pulse: 80 90 85 84  Temp:      TempSrc:      Resp: 14 25 18 22   SpO2: 99% 94% 97% 98%   Blood pressure 174/74, pulse 84, temperature 98 F (36.7 C), temperature source Oral, resp. rate 22, SpO2 98.00%. PSYCH: She is alert and oriented x4; does not appear anxious does not appear depressed; affect is normal HEENT: Normocephalic and Atraumatic, Mucous membranes pink; PERRLA; EOM intact; Fundi:  Benign;  No scleral icterus, Nares:  Patent, Oropharynx: Clear, Fair Dentition, Neck:  FROM, no cervical lymphadenopathy nor thyromegaly or carotid bruit; no JVD; Breasts:: Not examined CHEST WALL: No tenderness CHEST: Normal respiration, clear to auscultation bilaterally HEART: Regular rate and rhythm; no murmurs rubs or gallops BACK: No kyphosis or scoliosis; no CVA tenderness ABDOMEN: Positive Bowel Sounds, soft non-tender; no masses, no organomegaly Rectal Exam: Not done EXTREMITIES: No cyanosis, clubbing or edema; no ulcerations. Genitalia: not examined PULSES: 2+ and symmetric SKIN: Normal hydration no rash or ulceration CNS:  Alert and Oriented x 4, No Focal Deficits.    Vascular: pulses palpable throughout    Labs on Admission:  Basic Metabolic Panel:  Recent Labs Lab 05/12/14 1939  NA 118*  K <2.2*  CL 76*  CO2 31  GLUCOSE 122*  BUN 8  CREATININE 0.69  CALCIUM 8.5  MG 1.5   Liver Function Tests:  Recent Labs Lab 05/12/14 1939  AST 19  ALT 16  ALKPHOS 48  BILITOT 1.0  PROT 6.3  ALBUMIN 3.5   No results found for this basename: LIPASE, AMYLASE,  in the last 168 hours No results found for this basename: AMMONIA,  in the last 168 hours CBC:  Recent Labs Lab 05/12/14 1939  WBC 8.1  NEUTROABS 5.9  HGB 13.9  HCT 38.1  MCV 85.0  PLT 186   Cardiac Enzymes: No results found for this basename: CKTOTAL, CKMB, CKMBINDEX, TROPONINI,  in the last 168 hours  BNP (last 3 results) No results found for this basename: PROBNP,  in the last 8760 hours CBG: No results found for this basename: GLUCAP,  in the last 168 hours  Radiological Exams on Admission: Ct Abdomen Pelvis W Contrast  05/12/2014   CLINICAL DATA:  Left-sided back pain and nausea.  Microhematuria.  BUN and creatinine were obtained on site at [place] at [address]Results: BUN 9 mg/dL, Creatinine 0.8 mg/dL.  EXAM: CT ABDOMEN AND PELVIS WITH CONTRAST  TECHNIQUE: Multidetector CT imaging of the abdomen and pelvis was  performed using the standard protocol following bolus administration of intravenous contrast.  CONTRAST:  169mL OMNIPAQUE IOHEXOL 300 MG/ML  SOLN  COMPARISON:  CT scan dated 09/14/2006  FINDINGS: The patient has a large extruded disc fragment behind the body of L3 extending into the left lateral recess and left neural foramen. This should compress the left L3 nerve. It could have originated from the L2-3 or L3-4 disc space. This was not present on the prior exam of 10/28/2011 and may be the cause of the patient's left-sided back pain.  The liver, spleen, pancreas, adrenal glands, and kidneys demonstrate no significant abnormalities. There are 2 small benign cysts in the liver, 1 in the lateral segment of the left lobe adjacent to the gallbladder, 8 mm, and 1 in the posterior aspect of the dome of the right lobe of the liver, 5 mm.  Biliary tree is normal.  There are numerous diverticula in the distal colon but there is no diverticulitis. Terminal ileum and appendix appear normal. Extensive calcification in the abdominal aorta and proximal renal arteries. Uterus and ovaries are normal. The bladder is empty. No acute osseous abnormalities.  IMPRESSION: 1. Large extruded soft disc fragment lying behind the left side of L3 compressing the left L3 nerve in the thecal sac. This could have originated from L2-3 or L3-4. 2. Extensive diverticulosis of the sigmoid portion of the colon without diverticulitis.   Electronically Signed   By: Rozetta Nunnery M.D.   On: 05/12/2014 17:18      EKG: Independently reviewed. Normal Sinus Rhtyhm rate 87,  St Depression in V4,5,6.      Assessment/Plan:   79 y.o. female with  Principal Problem:   Electrolyte abnormality Active Problems:   Hypokalemia   Hyponatremia   Lumbosacral radiculopathy   Hematuria   Benign essential hypertension   Hyperlipidemia   Hypothyroid    1.  Electrolyte Abnormalities/ hypokalemia/ and Hyponatremia-   Telemetry Monitoring, and replacement  of K+ by IV and orally. Administered IV Magnesium for the low normal magnesium level,     and start IVFs with NSS for the Hyponatremia.  Check Urine Electrolytes and Urine Osms.          2.   Lumbosacral Radicular Pain- due to Extruding soft disc Fragment on Left Side of L-3,  EDP discussed with NeuroSurgery On-Call who advised Outpatient evaluation for further workup.  Pain Control PRN.    3.   Hematuria-  Send UA and Urine Culture.    4.   HTN-  Continue Lotensin Rx and monitor BPs.    5.  Hypothyroid- continue Levothyroxine Rx, and check TSH level in AM.    6.  Hyperlipidemia-   Continue Simvastatin Rx, and Omega 3 Fatty acids.    7.  DVT Prophylaxis with Lovenox.        Code Status:   FULL CODE Family Communication:    No Family Present Disposition Plan:       Inpatient  Time spent:  [place] [facility] Pager [phone]  If 7PM-7AM, please contact night-coverage [email or web address] Password [email or web address] 05/12/2014, 11:11 PM

## 2014-05-13 LAB — URINALYSIS, ROUTINE W REFLEX MICROSCOPIC
Bilirubin Urine: NEGATIVE
Glucose, UA: NEGATIVE mg/dL
Ketones, ur: NEGATIVE mg/dL
Leukocytes, UA: NEGATIVE
Nitrite: NEGATIVE
Protein, ur: NEGATIVE mg/dL
Specific Gravity, Urine: 1.014 (ref 1.005–1.030)
Urobilinogen, UA: 0.2 mg/dL (ref 0.0–1.0)
pH: 7.5 (ref 5.0–8.0)

## 2014-05-13 LAB — BASIC METABOLIC PANEL
BUN: 6 mg/dL (ref 6–23)
CO2: 30 mEq/L (ref 19–32)
Calcium: 7.8 mg/dL — ABNORMAL LOW (ref 8.4–10.5)
Chloride: 82 mEq/L — ABNORMAL LOW (ref 96–112)
Creatinine, Ser: 0.61 mg/dL (ref 0.50–1.10)
GFR calc Af Amer: >90 mL/min (ref >90)
GFR calc non Af Amer: 85 mL/min — ABNORMAL LOW (ref >90)
Glucose, Bld: 127 mg/dL — ABNORMAL HIGH (ref 70–99)
Potassium: 3.2 mEq/L — ABNORMAL LOW (ref 3.7–5.3)
Sodium: 120 mEq/L — CL (ref 137–147)

## 2014-05-13 LAB — CBC
HCT: 37.7 % (ref 36.0–46.0)
Hemoglobin: 13.2 g/dL (ref 12.0–15.0)
MCH: 30.6 pg (ref 26.0–34.0)
MCHC: 35 g/dL (ref 30.0–36.0)
MCV: 87.3 fL (ref 78.0–100.0)
Platelets: 184 10*3/uL (ref 150–400)
RBC: 4.32 MIL/uL (ref 3.87–5.11)
RDW: 12.6 % (ref 11.5–15.5)
WBC: 9.6 10*3/uL (ref 4.0–10.5)

## 2014-05-13 LAB — URINE MICROSCOPIC-ADD ON

## 2014-05-13 LAB — POTASSIUM
Potassium: 3.4 mEq/L — ABNORMAL LOW (ref 3.7–5.3)
Potassium: 3.4 mEq/L — ABNORMAL LOW (ref 3.7–5.3)

## 2014-05-13 LAB — OSMOLALITY, URINE: Osmolality, Ur: 378 mOsm/kg — ABNORMAL LOW (ref 390–1090)

## 2014-05-13 LAB — NA AND K (SODIUM & POTASSIUM), RAND UR
Potassium Urine: 37 mEq/L
Sodium, Ur: 105 mEq/L

## 2014-05-13 MED ORDER — POTASSIUM CHLORIDE IN NACL 40-0.9 MEQ/L-% IV SOLN
INTRAVENOUS | Status: DC
  Administered 2014-05-13 – 2014-05-14 (×3): 75 mL/h via INTRAVENOUS
  Filled 2014-05-13 (×6): qty 1000

## 2014-05-13 MED ORDER — ENOXAPARIN SODIUM 40 MG/0.4ML ~~LOC~~ SOLN
40.0000 mg | SUBCUTANEOUS | Status: DC
  Administered 2014-05-13 – 2014-05-14 (×2): 40 mg via SUBCUTANEOUS
  Filled 2014-05-13 (×3): qty 0.4

## 2014-05-13 NOTE — Progress Notes (Signed)
[facility] PROGRESS NOTE  Tammy Allison GGE:[id number] DOB: [birth year] DOA: 05/12/2014 PCP: [name], MD  Assessment/Plan: 1. Electrolyte Abnormalities/ hypokalemia/ and Hyponatremia- Potassium normalizing. Serum sodium improving slowly. Urine osm is low. Will fluid restrict diet and hold IVF 2. Lumbosacral Radicular Pain- due to Extruding soft disc Fragment on Left Side of L-3, EDP discussed with [facility] who advised Outpatient evaluation for further workup. Pain Control PRN.  3. Hematuria- Send UA and Urine Culture.  4. HTN- Continue Lotensin Rx and monitor BPs.  5. Hypothyroid- continue Levothyroxine Rx, and check TSH level in AM.  6. Hyperlipidemia- Continue Simvastatin Rx, and Omega 3 Fatty acids.  7. DVT Prophylaxis with Lovenox.   Code Status: Full Family Communication: Pt and family in room (indicate person spoken with, relationship, and if by phone, the number) Disposition Plan: Pending   Consultants:    Procedures:    Antibiotics:   (indicate start date, and stop date if known)  HPI/Subjective: Feels somewhat better today.  Objective: Filed Vitals:   05/12/14 2030 05/12/14 2101 05/12/14 2326 05/13/14 0525  BP:  174/74 165/72 150/65  Pulse: 85 84 81 83  Temp:   98 F (36.7 C) 98.1 F (36.7 C)  TempSrc:   Oral Oral  Resp: 18 22  20   Height:   5\' 4"  (1.626 m)   Weight:   135 lb 14.4 oz (61.644 kg)   SpO2: 97% 98% 100% 100%    Intake/Output Summary (Last 24 hours) at 05/13/14 1311 Last data filed at 05/13/14 0840  Gross per 24 hour  Intake 1229.17 ml  Output    750 ml  Net 479.17 ml   Filed Weights   05/12/14 2326  Weight: 135 lb 14.4 oz (61.644 kg)    Exam:   General:  Awake, in nad  Cardiovascular: regular, s1, s2  Respiratory: normal resp effort, no wheezing  Abdomen: soft,nondistended  Musculoskeletal: perfused, no clubbinbg   Data Reviewed: Basic Metabolic Panel:  Recent Labs Lab 05/12/14 1939  05/13/14 0500 05/13/14 1108  NA 118* 120*  --   K <2.2* 3.2* 3.4*  CL 76* 82*  --   CO2 31 30  --   GLUCOSE 122* 127*  --   BUN 8 6  --   CREATININE 0.69 0.61  --   CALCIUM 8.5 7.8*  --   MG 1.5  --   --    Liver Function Tests:  Recent Labs Lab 05/12/14 1939  AST 19  ALT 16  ALKPHOS 48  BILITOT 1.0  PROT 6.3  ALBUMIN 3.5   No results found for this basename: LIPASE, AMYLASE,  in the last 168 hours No results found for this basename: AMMONIA,  in the last 168 hours CBC:  Recent Labs Lab 05/12/14 1939 05/13/14 0500  WBC 8.1 9.6  NEUTROABS 5.9  --   HGB 13.9 13.2  HCT 38.1 37.7  MCV 85.0 87.3  PLT 186 184   Cardiac Enzymes: No results found for this basename: CKTOTAL, CKMB, CKMBINDEX, TROPONINI,  in the last 168 hours BNP (last 3 results) No results found for this basename: PROBNP,  in the last 8760 hours CBG: No results found for this basename: GLUCAP,  in the last 168 hours  No results found for this or any previous visit (from the past 240 hour(s)).   Studies: Ct Abdomen Pelvis W Contrast  05/12/2014   CLINICAL DATA:  Left-sided back pain and nausea.  Microhematuria.  BUN and creatinine were obtained on  site at [facility] at [address]Results: BUN 9 mg/dL, Creatinine 0.8 mg/dL.  EXAM: CT ABDOMEN AND PELVIS WITH CONTRAST  TECHNIQUE: Multidetector CT imaging of the abdomen and pelvis was performed using the standard protocol following bolus administration of intravenous contrast.  CONTRAST:  171mL OMNIPAQUE IOHEXOL 300 MG/ML  SOLN  COMPARISON:  CT scan dated 09/14/2006  FINDINGS: The patient has a large extruded disc fragment behind the body of L3 extending into the left lateral recess and left neural foramen. This should compress the left L3 nerve. It could have originated from the L2-3 or L3-4 disc space. This was not present on the prior exam of 10/28/2011 and may be the cause of the patient's left-sided back pain.  The liver, spleen, pancreas,  adrenal glands, and kidneys demonstrate no significant abnormalities. There are 2 small benign cysts in the liver, 1 in the lateral segment of the left lobe adjacent to the gallbladder, 8 mm, and 1 in the posterior aspect of the dome of the right lobe of the liver, 5 mm.  Biliary tree is normal. There are numerous diverticula in the distal colon but there is no diverticulitis. Terminal ileum and appendix appear normal. Extensive calcification in the abdominal aorta and proximal renal arteries. Uterus and ovaries are normal. The bladder is empty. No acute osseous abnormalities.  IMPRESSION: 1. Large extruded soft disc fragment lying behind the left side of L3 compressing the left L3 nerve in the thecal sac. This could have originated from L2-3 or L3-4. 2. Extensive diverticulosis of the sigmoid portion of the colon without diverticulitis.   Electronically Signed   By: Rozetta Nunnery M.D.   On: 05/12/2014 17:18    Scheduled Meds: . sodium chloride   Intravenous STAT  . benazepril  20 mg Oral Daily  . cholecalciferol  1,000 Units Oral Daily  . enoxaparin (LOVENOX) injection  40 mg Subcutaneous Q24H  . levothyroxine  50 mcg Oral QAC breakfast  . omega-3 acid ethyl esters  1 g Oral BID  . simvastatin  20 mg Oral QHS  . sodium chloride  3 mL Intravenous Q12H   Continuous Infusions: . sodium chloride Stopped (05/13/14 0252)  . 0.9 % NaCl with KCl 40 mEq / L 75 mL/hr (05/13/14 0252)    Principal Problem:   Electrolyte abnormality Active Problems:   Hypokalemia   Hyponatremia   Lumbosacral radiculopathy   Hematuria   Benign essential hypertension   Hyperlipidemia   Hypothyroid  Time spent: 19min  [facility], [facility] Pager [phone]. If 7PM-7AM, please contact night-coverage at [email or web address], password [facility] 05/13/2014, 1:11 PM  LOS: 1 day

## 2014-05-13 NOTE — Progress Notes (Signed)
CRITICAL VALUE ALERT  Critical value received:  Na+ 120  Date of notification:  05/13/2014  Time of notification:  0610  Critical value read back:Yes.    Nurse who received alert:  Carnella Guadalajara I  MD notified (1st page):  N/a  Time of first page:  n/a  NP not paged. This value is improved from admission. NP is aware of critical level. Will continue to monitor pt closely. Carnella Guadalajara I

## 2014-05-13 NOTE — Progress Notes (Signed)
Pt transported from ED via stretcher and ambulated to bed. VSS at this time. No distress noted. Will continue to monitor pt closely. Carnella Guadalajara I

## 2014-05-13 NOTE — Progress Notes (Signed)
Received report from [place] and agree with asessment.

## 2014-05-14 LAB — CLOSTRIDIUM DIFFICILE BY PCR: Toxigenic C. Difficile by PCR: NEGATIVE

## 2014-05-14 LAB — BASIC METABOLIC PANEL
BUN: 6 mg/dL (ref 6–23)
CO2: 26 mEq/L (ref 19–32)
Calcium: 7.7 mg/dL — ABNORMAL LOW (ref 8.4–10.5)
Chloride: 84 mEq/L — ABNORMAL LOW (ref 96–112)
Creatinine, Ser: 0.77 mg/dL (ref 0.50–1.10)
GFR calc Af Amer: >90 mL/min (ref >90)
GFR calc non Af Amer: 79 mL/min — ABNORMAL LOW (ref >90)
Glucose, Bld: 117 mg/dL — ABNORMAL HIGH (ref 70–99)
Potassium: 3.3 mEq/L — ABNORMAL LOW (ref 3.7–5.3)
Sodium: 121 mEq/L — CL (ref 137–147)

## 2014-05-14 LAB — OSMOLALITY: Osmolality: 254 mOsm/kg — ABNORMAL LOW (ref 275–300)

## 2014-05-14 LAB — SODIUM: Sodium: 124 mEq/L — ABNORMAL LOW (ref 137–147)

## 2014-05-14 MED ORDER — POTASSIUM CHLORIDE CRYS ER 20 MEQ PO TBCR
40.0000 meq | EXTENDED_RELEASE_TABLET | Freq: Once | ORAL | Status: AC
  Administered 2014-05-14: 40 meq via ORAL
  Filled 2014-05-14: qty 2

## 2014-05-14 MED ORDER — BOOST / RESOURCE BREEZE PO LIQD
1.0000 | Freq: Three times a day (TID) | ORAL | Status: DC
  Administered 2014-05-14: 1 via ORAL

## 2014-05-14 NOTE — Progress Notes (Signed)
INITIAL NUTRITION ASSESSMENT  DOCUMENTATION CODES Per approved criteria  -Not Applicable   INTERVENTION: - Resource Breeze po BID, each supplement provides 250 kcal and 9 grams of protein - RD will continue to monitor  NUTRITION DIAGNOSIS: Inadequate oral intake related to diarrhea and nausea as evidenced by reported intake less than estimated needs and minor.   Goal: Pt to meet >/= 90% of their estimated nutrition needs   Monitor:  Weight, po intake, labs  Reason for Assessment: MST  79 y.o. female  Admitting Dx: Electrolyte abnormality  ASSESSMENT: 79 y.o. female who was seen by her PCP and sent for a CT scan of the ABD and Pelvis due to a 2 week history of Lower back pain and findings of microscopic hematuria in the office on UA. Labs were ordered as well and returned with abnormal findings of critically low potassium and sodium levels so she was referred to the ED for evalaution and admission.  - Pt reports that she has lost about 4 lbs recently.  - She reported many episodes of diarrhea since hospital admission. - She says that she has been eating only a few bites at each meal due to nausea. - Pt with no signs of fat and muscle wasting - K 3.3 - Na 121  Height: Ht Readings from Last 1 Encounters:  05/12/14 5\' 4"  (1.626 m)    Weight: Wt Readings from Last 1 Encounters:  05/12/14 135 lb 14.4 oz (61.644 kg)    Ideal Body Weight: 54.7 kg  % Ideal Body Weight: 113%  Wt Readings from Last 10 Encounters:  05/12/14 135 lb 14.4 oz (61.644 kg)    Usual Body Weight: 139 lbs  % Usual Body Weight: 97%  BMI:  Body mass index is 23.32 kg/(m^2).  Estimated Nutritional Needs: Kcal: 4010-2725 Protein: 75-85 g Fluid: 1.6-1.8 L/day  Skin: Intact  Diet Order: General  EDUCATION NEEDS: -Education needs addressed   Intake/Output Summary (Last 24 hours) at 05/14/14 1101 Last data filed at 05/14/14 0600  Gross per 24 hour  Intake    630 ml  Output   1200 ml   Net   -570 ml    Last BM: 6/28   Labs:   Recent Labs Lab 05/12/14 1939 05/13/14 0500 05/13/14 1108 05/13/14 1700 05/14/14 0445  NA 118* 120*  --   --  121*  K <2.2* 3.2* 3.4* 3.4* 3.3*  CL 76* 82*  --   --  84*  CO2 31 30  --   --  26  BUN 8 6  --   --  6  CREATININE 0.69 0.61  --   --  0.77  CALCIUM 8.5 7.8*  --   --  7.7*  MG 1.5  --   --   --   --   GLUCOSE 122* 127*  --   --  117*    CBG (last 3)  No results found for this basename: GLUCAP,  in the last 72 hours  Scheduled Meds: . benazepril  20 mg Oral Daily  . cholecalciferol  1,000 Units Oral Daily  . enoxaparin (LOVENOX) injection  40 mg Subcutaneous Q24H  . levothyroxine  50 mcg Oral QAC breakfast  . omega-3 acid ethyl esters  1 g Oral BID  . simvastatin  20 mg Oral QHS  . sodium chloride  3 mL Intravenous Q12H    Continuous Infusions: . 0.9 % NaCl with KCl 40 mEq / L 75 mL/hr (05/13/14 0252)  Past Medical History  Diagnosis Date  . Hypertension   . Hyperlipidemia     Past Surgical History  Procedure Laterality Date  . Thyroid surgery    . Fracture surgery      right hip    Terrace Arabia RD, LDN

## 2014-05-14 NOTE — Progress Notes (Signed)
Assumed care of patient at 2300. Received report from [place], [place]. Will continue to monitor patient closely. Blanchard Kelch, RN

## 2014-05-14 NOTE — Progress Notes (Signed)
[facility] PROGRESS NOTE  Tammy Allison [id number]:[id number] DOB: May 04, 1935 DOA: 05/12/2014 PCP: [name], MD  Assessment/Plan: 1. Electrolyte Abnormalities/ hypokalemia/ and Hyponatremia- Potassium normalizing. Serum sodium improving slowly. Urine osm was low. Fluid restrict diet and hold IVF for now. Will check serum osm 2. Lumbosacral Radicular Pain- due to Extruding soft disc Fragment on Left Side of L-3, EDP discussed with [facility] On[facility]Call who advised Outpatient evaluation for further workup. Pain Control PRN.  3. Hematuria- Send UA and Urine Culture.  4. HTN- Continue Lotensin Rx and monitor BPs.  5. Hypothyroid- continue Levothyroxine Rx, and check TSH level in AM.  6. Hyperlipidemia- Continue Simvastatin Rx, and Omega 3 Fatty acids.  7. DVT Prophylaxis with Lovenox.   Code Status: Full Family Communication: Pt and family in room (indicate person spoken with, relationship, and if by phone, the number) Disposition Plan: Pending   Consultants:    Procedures:    Antibiotics:   (indicate start date, and stop date if known)  HPI/Subjective: Some loose stools overnight. Otherwise, pt continues to feel generally weak  Objective: Filed Vitals:   05/13/14 1350 05/13/14 2200 05/14/14 0215 05/14/14 0647  BP: 116/72 102/50 125/54 102/52  Pulse: 85 83 78 79  Temp: 98.4 F (36.9 C) 97.9 F (36.6 C) 98.2 F (36.8 C) 98.1 F (36.7 C)  TempSrc: Oral Oral Oral Oral  Resp: 20 18 18 18   Height:      Weight:      SpO2: 98% 99% 100% 100%    Intake/Output Summary (Last 24 hours) at 05/14/14 1346 Last data filed at 05/14/14 0817  Gross per 24 hour  Intake    630 ml  Output    700 ml  Net    -70 ml   Filed Weights   05/12/14 2326  Weight: 135 lb 14.4 oz (61.644 kg)    Exam:   General:  Awake, in nad  Cardiovascular: regular, s1, s2  Respiratory: normal resp effort, no wheezing  Abdomen: soft,nondistended  Musculoskeletal: perfused, no clubbinbg    Data Reviewed: Basic Metabolic Panel:  Recent Labs Lab 05/12/14 1939 05/13/14 0500 05/13/14 1108 05/13/14 1700 05/14/14 0445  NA 118* 120*  --   --  121*  K <2.2* 3.2* 3.4* 3.4* 3.3*  CL 76* 82*  --   --  84*  CO2 31 30  --   --  26  GLUCOSE 122* 127*  --   --  117*  BUN 8 6  --   --  6  CREATININE 0.69 0.61  --   --  0.77  CALCIUM 8.5 7.8*  --   --  7.7*  MG 1.5  --   --   --   --    Liver Function Tests:  Recent Labs Lab 05/12/14 1939  AST 19  ALT 16  ALKPHOS 48  BILITOT 1.0  PROT 6.3  ALBUMIN 3.5   No results found for this basename: LIPASE, AMYLASE,  in the last 168 hours No results found for this basename: AMMONIA,  in the last 168 hours CBC:  Recent Labs Lab 05/12/14 1939 05/13/14 0500  WBC 8.1 9.6  NEUTROABS 5.9  --   HGB 13.9 13.2  HCT 38.1 37.7  MCV 85.0 87.3  PLT 186 184   Cardiac Enzymes: No results found for this basename: CKTOTAL, CKMB, CKMBINDEX, TROPONINI,  in the last 168 hours BNP (last 3 results) No results found for this basename: PROBNP,  in the last 8760 hours CBG: No  results found for this basename: GLUCAP,  in the last 168 hours  No results found for this or any previous visit (from the past 240 hour(s)).   Studies: Ct Abdomen Pelvis W Contrast  05/12/2014   CLINICAL DATA:  Left-sided back pain and nausea.  Microhematuria.  BUN and creatinine were obtained on site at [facility] at [address]Results: BUN 9 mg/dL, Creatinine 0.8 mg/dL.  EXAM: CT ABDOMEN AND PELVIS WITH CONTRAST  TECHNIQUE: Multidetector CT imaging of the abdomen and pelvis was performed using the standard protocol following bolus administration of intravenous contrast.  CONTRAST:  183mL OMNIPAQUE IOHEXOL 300 MG/ML  SOLN  COMPARISON:  CT scan dated 09/14/2006  FINDINGS: The patient has a large extruded disc fragment behind the body of L3 extending into the left lateral recess and left neural foramen. This should compress the left L3 nerve. It could  have originated from the L2-3 or L3-4 disc space. This was not present on the prior exam of 10/28/2011 and may be the cause of the patient's left-sided back pain.  The liver, spleen, pancreas, adrenal glands, and kidneys demonstrate no significant abnormalities. There are 2 small benign cysts in the liver, 1 in the lateral segment of the left lobe adjacent to the gallbladder, 8 mm, and 1 in the posterior aspect of the dome of the right lobe of the liver, 5 mm.  Biliary tree is normal. There are numerous diverticula in the distal colon but there is no diverticulitis. Terminal ileum and appendix appear normal. Extensive calcification in the abdominal aorta and proximal renal arteries. Uterus and ovaries are normal. The bladder is empty. No acute osseous abnormalities.  IMPRESSION: 1. Large extruded soft disc fragment lying behind the left side of L3 compressing the left L3 nerve in the thecal sac. This could have originated from L2-3 or L3-4. 2. Extensive diverticulosis of the sigmoid portion of the colon without diverticulitis.   Electronically Signed   By: Rozetta Nunnery M.D.   On: 05/12/2014 17:18    Scheduled Meds: . benazepril  20 mg Oral Daily  . cholecalciferol  1,000 Units Oral Daily  . enoxaparin (LOVENOX) injection  40 mg Subcutaneous Q24H  . feeding supplement (RESOURCE BREEZE)  1 Container Oral TID BM  . levothyroxine  50 mcg Oral QAC breakfast  . omega-3 acid ethyl esters  1 g Oral BID  . simvastatin  20 mg Oral QHS  . sodium chloride  3 mL Intravenous Q12H   Continuous Infusions: . 0.9 % NaCl with KCl 40 mEq / L 75 mL/hr (05/13/14 0252)    Principal Problem:   Electrolyte abnormality Active Problems:   Hypokalemia   Hyponatremia   Lumbosacral radiculopathy   Hematuria   Benign essential hypertension   Hyperlipidemia   Hypothyroid  Time spent: 84min  Clemons Salvucci, [facility] Pager [phone]. If 7PM-7AM, please contact night-coverage at [email or web address], password  [facility] 05/14/2014, 1:46 PM  LOS: 2 days

## 2014-05-15 LAB — BASIC METABOLIC PANEL
BUN: 5 mg/dL — ABNORMAL LOW (ref 6–23)
CO2: 27 mEq/L (ref 19–32)
Calcium: 7.9 mg/dL — ABNORMAL LOW (ref 8.4–10.5)
Chloride: 95 mEq/L — ABNORMAL LOW (ref 96–112)
Creatinine, Ser: 0.67 mg/dL (ref 0.50–1.10)
GFR calc Af Amer: >90 mL/min (ref >90)
GFR calc non Af Amer: 83 mL/min — ABNORMAL LOW (ref >90)
Glucose, Bld: 120 mg/dL — ABNORMAL HIGH (ref 70–99)
Potassium: 4.6 mEq/L (ref 3.7–5.3)
Sodium: 131 mEq/L — ABNORMAL LOW (ref 137–147)

## 2014-05-15 MED ORDER — FENTANYL 25 MCG/HR TD PT72
25.0000 ug | MEDICATED_PATCH | TRANSDERMAL | Status: DC
  Administered 2014-05-15: 25 ug via TRANSDERMAL
  Filled 2014-05-15: qty 1

## 2014-05-15 MED ORDER — FENTANYL 25 MCG/HR TD PT72: 25.0000 ug | MEDICATED_PATCH | TRANSDERMAL | Status: DC

## 2014-05-15 MED ORDER — PANTOPRAZOLE SODIUM 40 MG PO TBEC
40.0000 mg | DELAYED_RELEASE_TABLET | Freq: Every day | ORAL | Status: DC
  Administered 2014-05-15: 40 mg via ORAL
  Filled 2014-05-15: qty 1

## 2014-05-15 MED ORDER — PANTOPRAZOLE SODIUM 40 MG PO TBEC: 40.0000 mg | DELAYED_RELEASE_TABLET | Freq: Every day | ORAL | Status: DC

## 2014-05-15 MED ORDER — DOCUSATE SODIUM 100 MG PO CAPS: 100.0000 mg | ORAL_CAPSULE | Freq: Two times a day (BID) | ORAL | Status: DC

## 2014-05-15 NOTE — Discharge Summary (Addendum)
Physician Discharge Summary  MASA LUBIN TKP:[id number] DOB: [birth year] DOA: 05/12/2014  PCP: [name], MD  Admit date: 05/12/2014 Discharge date: 05/15/2014  Time spent: 35 minutes  Recommendations for Outpatient Follow-up:  1. Follow up with PCP in 1-2 weeks  Discharge Diagnoses:  Principal Problem:   Electrolyte abnormality Active Problems:   Hypokalemia   Hyponatremia   Lumbosacral radiculopathy   Hematuria   Benign essential hypertension   Hyperlipidemia   Hypothyroid   Discharge Condition: Improved  Diet recommendation: Regular  Filed Weights   05/12/14 2326  Weight: 135 lb 14.4 oz (61.644 kg)    History of present illness:  Tammy Allison is a 79 y.o. female who was seen by her PCP and sent for a CT scan of the ABD and Pelvis due to a 2 week history of Lower back pain and findings of microscopic hematuria in the office on UA. Labs were ordered as well and returned with abnormal findings of critically low potassium and sodium levels so she was referred to the ED for evalaution and admission. Her repeat potassium levels was found to be < 2.2, and her sodium level was found to be 118.   Hospital Course:  1. Electrolyte Abnormalities/ hypokalemia/ and Hyponatremia- Urine and serum osm were both low in setting of hyponatremia. Question primary polydipsia. Potassium was corrected. Serum sodium much improved with fluid restriction. 2. Lumbosacral Radicular Pain- due to Extruding soft disc Fragment on Left Side of L-3, EDP discussed with [facility]gery On-Call who advised Outpatient evaluation for further workup. Pain Control PRN.  3. Hematuria- UA not suggestive of UTI.  4. HTN- Continued Lotensin Rx and monitor BPs.  5. Hypothyroid- continued Levothyroxine Rx 6. Hyperlipidemia- Continue Simvastatin Rx, and Omega 3 Fatty acids.  7. DVT Prophylaxis with Lovenox while inpatient  Discharge Exam: Filed Vitals:   05/14/14 1424 05/14/14 2137 05/15/14 0500 05/15/14  1350  BP: 115/49 130/56 128/48 136/52  Pulse: 83 84 87 91  Temp: 98.2 F (36.8 C) 98.4 F (36.9 C) 98 F (36.7 C) 98.1 F (36.7 C)  TempSrc: Oral Oral Oral Oral  Resp: 18 20 20 19   Height:      Weight:      SpO2: 97% 98% 98% 99%    General: Awake, in nad Cardiovascular: regular, s1, s2 Respiratory: normal resp effort, no wheezing  Discharge Instructions     Medication List         acetaminophen 325 MG tablet  Commonly known as:  TYLENOL  Take 650 mg by mouth every 6 (six) hours as needed (PAIN).     benazepril 20 MG tablet  Commonly known as:  LOTENSIN  Take 20 mg by mouth daily.     cholecalciferol 1000 UNITS tablet  Commonly known as:  VITAMIN D  Take 1,000 Units by mouth daily.     docusate sodium 100 MG capsule  Commonly known as:  COLACE  Take 1 capsule (100 mg total) by mouth 2 (two) times daily.     fentaNYL 25 MCG/HR patch  Commonly known as:  DURAGESIC - dosed mcg/hr  Place 1 patch (25 mcg total) onto the skin every 3 (three) days.     HYDROcodone-acetaminophen 5-325 MG per tablet  Commonly known as:  NORCO/VICODIN  Take 1 tablet by mouth every 6 (six) hours as needed for moderate pain (PAIN).     levothyroxine 50 MCG tablet  Commonly known as:  SYNTHROID, LEVOTHROID  Take 50 mcg by mouth daily before breakfast.  omega-3 acid ethyl esters 1 G capsule  Commonly known as:  LOVAZA  Take 1 g by mouth 2 (two) times daily.     pantoprazole 40 MG tablet  Commonly known as:  PROTONIX  Take 1 tablet (40 mg total) by mouth daily.     simvastatin 20 MG tablet  Commonly known as:  ZOCOR  Take 20 mg by mouth at bedtime.       Allergies  Allergen Reactions  . Sulfa Antibiotics Hives   Follow-up Information   Follow up with [name], MD. Schedule an appointment as soon as possible for a visit in 1 week.   Specialty:  Family Medicine   Contact information:   [address] [place] [address] [phone]        The results  of significant diagnostics from this hospitalization (including imaging, microbiology, ancillary and laboratory) are listed below for reference.    Significant Diagnostic Studies: Dg Lumbar Spine 2-3 Views  04/21/2014   CLINICAL DATA:  79 year old female with sharp pain radiating to the left hip. Initial encounter.  EXAM: LUMBAR SPINE - 2-3 VIEW  COMPARISON:  CT Abdomen and Pelvis 10/28/2011.  FINDINGS: Upright views of the lumbar spine. Bone mineralization is within normal limits for age. Normal lumbar segmentation. Stable and normal vertebral height and alignment. Stable and relatively preserved disc spaces throughout. Sacral ala and SI joints appear within normal limits. Grossly intact visualized lower thoracic levels. Aortoiliac calcified atherosclerosis noted.  IMPRESSION: Negative for age radiographic appearance of the lumbar spine. Aortoiliac calcified atherosclerosis.   Electronically Signed   By: Lars Pinks M.D.   On: 04/21/2014 14:46   Ct Abdomen Pelvis W Contrast  05/12/2014   CLINICAL DATA:  Left-sided back pain and nausea.  Microhematuria.  BUN and creatinine were obtained on site at [facility] at [address]Results: BUN 9 mg/dL, Creatinine 0.8 mg/dL.  EXAM: CT ABDOMEN AND PELVIS WITH CONTRAST  TECHNIQUE: Multidetector CT imaging of the abdomen and pelvis was performed using the standard protocol following bolus administration of intravenous contrast.  CONTRAST:  131mL OMNIPAQUE IOHEXOL 300 MG/ML  SOLN  COMPARISON:  CT scan dated 09/14/2006  FINDINGS: The patient has a large extruded disc fragment behind the body of L3 extending into the left lateral recess and left neural foramen. This should compress the left L3 nerve. It could have originated from the L2-3 or L3-4 disc space. This was not present on the prior exam of 10/28/2011 and may be the cause of the patient's left-sided back pain.  The liver, spleen, pancreas, adrenal glands, and kidneys demonstrate no significant  abnormalities. There are 2 small benign cysts in the liver, 1 in the lateral segment of the left lobe adjacent to the gallbladder, 8 mm, and 1 in the posterior aspect of the dome of the right lobe of the liver, 5 mm.  Biliary tree is normal. There are numerous diverticula in the distal colon but there is no diverticulitis. Terminal ileum and appendix appear normal. Extensive calcification in the abdominal aorta and proximal renal arteries. Uterus and ovaries are normal. The bladder is empty. No acute osseous abnormalities.  IMPRESSION: 1. Large extruded soft disc fragment lying behind the left side of L3 compressing the left L3 nerve in the thecal sac. This could have originated from L2-3 or L3-4. 2. Extensive diverticulosis of the sigmoid portion of the colon without diverticulitis.   Electronically Signed   By: Rozetta Nunnery M.D.   On: 05/12/2014 17:18  Microbiology: Recent Results (from the past 240 hour(s))  URINE CULTURE     Status: None   Collection Time    05/13/14  2:21 AM      Result Value Ref Range Status   Specimen Description URINE, CLEAN CATCH   Final   Special Requests NONE   Final   Culture  Setup Time     Final   Value: 05/13/2014 15:31     Performed at [facility]     Final   Value: 35,000 COLONIES/ML     Performed at [facility]   Culture     Final   Value: [place]     Performed at [facility]   Report Status PENDING   Incomplete  CLOSTRIDIUM DIFFICILE BY PCR     Status: None   Collection Time    05/14/14 10:25 AM      Result Value Ref Range Status   C difficile by pcr NEGATIVE  NEGATIVE Final   Comment: Performed at [facility]: Basic Metabolic Panel:  Recent Labs Lab 05/12/14 1939 05/13/14 0500 05/13/14 1108 05/13/14 1700 05/14/14 0445 05/14/14 1425 05/15/14 0431  NA 118* 120*  --   --  121* 124* 131*  K <2.2* 3.2* 3.4* 3.4* 3.3*  --  4.6  CL 76* 82*  --    --  84*  --  95*  CO2 31 30  --   --  26  --  27  GLUCOSE 122* 127*  --   --  117*  --  120*  BUN 8 6  --   --  6  --  5*  CREATININE 0.69 0.61  --   --  0.77  --  0.67  CALCIUM 8.5 7.8*  --   --  7.7*  --  7.9*  MG 1.5  --   --   --   --   --   --    Liver Function Tests:  Recent Labs Lab 05/12/14 1939  AST 19  ALT 16  ALKPHOS 48  BILITOT 1.0  PROT 6.3  ALBUMIN 3.5   No results found for this basename: LIPASE, AMYLASE,  in the last 168 hours No results found for this basename: AMMONIA,  in the last 168 hours CBC:  Recent Labs Lab 05/12/14 1939 05/13/14 0500  WBC 8.1 9.6  NEUTROABS 5.9  --   HGB 13.9 13.2  HCT 38.1 37.7  MCV 85.0 87.3  PLT 186 184   Cardiac Enzymes: No results found for this basename: CKTOTAL, CKMB, CKMBINDEX, TROPONINI,  in the last 168 hours BNP: BNP (last 3 results) No results found for this basename: PROBNP,  in the last 8760 hours CBG: No results found for this basename: GLUCAP,  in the last 168 hours  Signed:  Fleur Audino K  [facility] 05/15/2014, 4:05 PM

## 2014-05-15 NOTE — Progress Notes (Signed)
Spoke with pt concerning discharge plans and Home Health. Pt selected [facility] for Home PT, referral was given to in house rep.

## 2014-05-16 LAB — URINE CULTURE: Colony Count: 35000

## 2014-05-17 DIAGNOSIS — R197 Diarrhea, unspecified: Secondary | ICD-10-CM | POA: Diagnosis not present

## 2014-05-17 DIAGNOSIS — E876 Hypokalemia: Secondary | ICD-10-CM | POA: Diagnosis not present

## 2014-05-17 DIAGNOSIS — E871 Hypo-osmolality and hyponatremia: Secondary | ICD-10-CM | POA: Diagnosis not present

## 2014-05-23 DIAGNOSIS — M545 Low back pain, unspecified: Secondary | ICD-10-CM | POA: Diagnosis not present

## 2014-05-23 DIAGNOSIS — IMO0002 Reserved for concepts with insufficient information to code with codable children: Secondary | ICD-10-CM | POA: Diagnosis not present

## 2014-05-23 DIAGNOSIS — M47817 Spondylosis without myelopathy or radiculopathy, lumbosacral region: Secondary | ICD-10-CM | POA: Diagnosis not present

## 2014-05-23 DIAGNOSIS — M5137 Other intervertebral disc degeneration, lumbosacral region: Secondary | ICD-10-CM | POA: Diagnosis not present

## 2014-05-23 DIAGNOSIS — M546 Pain in thoracic spine: Secondary | ICD-10-CM | POA: Diagnosis not present

## 2014-05-23 DIAGNOSIS — M5126 Other intervertebral disc displacement, lumbar region: Secondary | ICD-10-CM | POA: Diagnosis not present

## 2014-05-24 ENCOUNTER — Other Ambulatory Visit: Payer: Self-pay | Admitting: Neurosurgery

## 2014-05-24 ENCOUNTER — Encounter (HOSPITAL_COMMUNITY): Payer: Self-pay | Admitting: *Deleted

## 2014-05-24 ENCOUNTER — Encounter (HOSPITAL_COMMUNITY): Payer: Self-pay | Admitting: Pharmacy Technician

## 2014-05-24 ENCOUNTER — Other Ambulatory Visit (HOSPITAL_COMMUNITY): Payer: Self-pay | Admitting: *Deleted

## 2014-05-24 DIAGNOSIS — IMO0002 Reserved for concepts with insufficient information to code with codable children: Secondary | ICD-10-CM | POA: Diagnosis not present

## 2014-05-24 DIAGNOSIS — E871 Hypo-osmolality and hyponatremia: Secondary | ICD-10-CM | POA: Diagnosis not present

## 2014-05-24 DIAGNOSIS — R197 Diarrhea, unspecified: Secondary | ICD-10-CM | POA: Diagnosis not present

## 2014-05-24 MED ORDER — MUPIROCIN 2 % EX OINT
TOPICAL_OINTMENT | Freq: Two times a day (BID) | CUTANEOUS | Status: DC
  Administered 2014-05-25: 08:00:00 via NASAL
  Filled 2014-05-24: qty 22

## 2014-05-24 MED ORDER — CEFAZOLIN SODIUM-DEXTROSE 2-3 GM-% IV SOLR
2.0000 g | INTRAVENOUS | Status: AC
  Administered 2014-05-25: 2 g via INTRAVENOUS
  Filled 2014-05-24: qty 50

## 2014-05-25 ENCOUNTER — Ambulatory Visit (HOSPITAL_COMMUNITY): Payer: Medicare Other | Admitting: Anesthesiology

## 2014-05-25 ENCOUNTER — Inpatient Hospital Stay (HOSPITAL_COMMUNITY)
Admission: AD | Admit: 2014-05-25 | Discharge: 2014-05-26 | DRG: 520 | Disposition: A | Discharge status: Home / Self Care | Payer: Medicare Other | Source: Ambulatory Visit | Attending: Neurosurgery | Admitting: Neurosurgery

## 2014-05-25 ENCOUNTER — Encounter (HOSPITAL_COMMUNITY): Payer: Medicare Other | Admitting: Anesthesiology

## 2014-05-25 ENCOUNTER — Ambulatory Visit (HOSPITAL_COMMUNITY): Payer: Medicare Other

## 2014-05-25 ENCOUNTER — Encounter (HOSPITAL_COMMUNITY): Payer: Self-pay | Admitting: *Deleted

## 2014-05-25 ENCOUNTER — Encounter (HOSPITAL_COMMUNITY)
Admission: AD | Disposition: A | Discharge status: Home / Self Care | Payer: Self-pay | Source: Ambulatory Visit | Attending: Neurosurgery

## 2014-05-25 DIAGNOSIS — I10 Essential (primary) hypertension: Secondary | ICD-10-CM | POA: Diagnosis present

## 2014-05-25 DIAGNOSIS — M5126 Other intervertebral disc displacement, lumbar region: Principal | ICD-10-CM | POA: Diagnosis present

## 2014-05-25 DIAGNOSIS — K219 Gastro-esophageal reflux disease without esophagitis: Secondary | ICD-10-CM | POA: Diagnosis present

## 2014-05-25 DIAGNOSIS — E785 Hyperlipidemia, unspecified: Secondary | ICD-10-CM | POA: Diagnosis present

## 2014-05-25 DIAGNOSIS — Z9849 Cataract extraction status, unspecified eye: Secondary | ICD-10-CM | POA: Diagnosis not present

## 2014-05-25 DIAGNOSIS — Z961 Presence of intraocular lens: Secondary | ICD-10-CM

## 2014-05-25 DIAGNOSIS — M539 Dorsopathy, unspecified: Secondary | ICD-10-CM | POA: Diagnosis not present

## 2014-05-25 DIAGNOSIS — Z01818 Encounter for other preprocedural examination: Secondary | ICD-10-CM | POA: Diagnosis not present

## 2014-05-25 DIAGNOSIS — M47817 Spondylosis without myelopathy or radiculopathy, lumbosacral region: Secondary | ICD-10-CM | POA: Diagnosis not present

## 2014-05-25 DIAGNOSIS — E039 Hypothyroidism, unspecified: Secondary | ICD-10-CM | POA: Diagnosis present

## 2014-05-25 DIAGNOSIS — M51379 Other intervertebral disc degeneration, lumbosacral region without mention of lumbar back pain or lower extremity pain: Secondary | ICD-10-CM | POA: Diagnosis present

## 2014-05-25 DIAGNOSIS — Z79899 Other long term (current) drug therapy: Secondary | ICD-10-CM | POA: Diagnosis not present

## 2014-05-25 DIAGNOSIS — Z882 Allergy status to sulfonamides status: Secondary | ICD-10-CM | POA: Diagnosis not present

## 2014-05-25 DIAGNOSIS — M545 Low back pain, unspecified: Secondary | ICD-10-CM | POA: Diagnosis not present

## 2014-05-25 DIAGNOSIS — M5137 Other intervertebral disc degeneration, lumbosacral region: Secondary | ICD-10-CM | POA: Diagnosis not present

## 2014-05-25 HISTORY — DX: Gastro-esophageal reflux disease without esophagitis: K21.9

## 2014-05-25 HISTORY — DX: Personal history of other diseases of the digestive system: Z87.19

## 2014-05-25 HISTORY — DX: Irritable bowel syndrome, unspecified: K58.9

## 2014-05-25 HISTORY — DX: Unspecified osteoarthritis, unspecified site: M19.90

## 2014-05-25 HISTORY — DX: Hypothyroidism, unspecified: E03.9

## 2014-05-25 HISTORY — PX: LUMBAR LAMINECTOMY/DECOMPRESSION MICRODISCECTOMY: SHX5026

## 2014-05-25 LAB — SURGICAL PCR SCREEN
MRSA, PCR: NEGATIVE
Staphylococcus aureus: NEGATIVE

## 2014-05-25 SURGERY — LUMBAR LAMINECTOMY/DECOMPRESSION MICRODISCECTOMY 1 LEVEL
Anesthesia: General | Laterality: Left

## 2014-05-25 MED ORDER — LIDOCAINE HCL (CARDIAC) 20 MG/ML IV SOLN
INTRAVENOUS | Status: AC
  Filled 2014-05-25: qty 5

## 2014-05-25 MED ORDER — ACETAMINOPHEN 10 MG/ML IV SOLN
INTRAVENOUS | Status: DC | PRN
  Administered 2014-05-25: 1000 mg via INTRAVENOUS

## 2014-05-25 MED ORDER — PROPOFOL 10 MG/ML IV BOLUS
INTRAVENOUS | Status: AC
  Filled 2014-05-25: qty 20

## 2014-05-25 MED ORDER — 0.9 % SODIUM CHLORIDE (POUR BTL) OPTIME
TOPICAL | Status: DC | PRN
  Administered 2014-05-25: 1000 mL

## 2014-05-25 MED ORDER — ROCURONIUM BROMIDE 100 MG/10ML IV SOLN
INTRAVENOUS | Status: DC | PRN
  Administered 2014-05-25: 40 mg via INTRAVENOUS

## 2014-05-25 MED ORDER — ROCURONIUM BROMIDE 50 MG/5ML IV SOLN
INTRAVENOUS | Status: AC
  Filled 2014-05-25: qty 1

## 2014-05-25 MED ORDER — PROPOFOL 10 MG/ML IV BOLUS
INTRAVENOUS | Status: DC | PRN
  Administered 2014-05-25: 150 mg via INTRAVENOUS

## 2014-05-25 MED ORDER — DOCUSATE SODIUM 100 MG PO CAPS
100.0000 mg | ORAL_CAPSULE | Freq: Two times a day (BID) | ORAL | Status: DC | PRN
  Filled 2014-05-25: qty 1

## 2014-05-25 MED ORDER — OMEGA-3-ACID ETHYL ESTERS 1 G PO CAPS
1.0000 g | ORAL_CAPSULE | Freq: Every day | ORAL | Status: DC
  Filled 2014-05-25 (×2): qty 1

## 2014-05-25 MED ORDER — ZOLPIDEM TARTRATE 5 MG PO TABS: 5.0000 mg | ORAL_TABLET | Freq: Every evening | ORAL | Status: DC | PRN

## 2014-05-25 MED ORDER — NEOSTIGMINE METHYLSULFATE 10 MG/10ML IV SOLN
INTRAVENOUS | Status: DC | PRN
  Administered 2014-05-25: 3 mg via INTRAVENOUS

## 2014-05-25 MED ORDER — ONDANSETRON HCL 4 MG/2ML IJ SOLN
INTRAMUSCULAR | Status: DC | PRN
  Administered 2014-05-25: 4 mg via INTRAVENOUS

## 2014-05-25 MED ORDER — FENTANYL CITRATE 0.05 MG/ML IJ SOLN
INTRAMUSCULAR | Status: AC
  Filled 2014-05-25: qty 5

## 2014-05-25 MED ORDER — PHENOL 1.4 % MT LIQD: 1.0000 | OROMUCOSAL | Status: DC | PRN

## 2014-05-25 MED ORDER — KETOROLAC TROMETHAMINE 30 MG/ML IJ SOLN
30.0000 mg | Freq: Four times a day (QID) | INTRAMUSCULAR | Status: DC
  Administered 2014-05-25 – 2014-05-26 (×3): 30 mg via INTRAVENOUS
  Filled 2014-05-25 (×6): qty 1

## 2014-05-25 MED ORDER — LACTATED RINGERS IV SOLN
INTRAVENOUS | Status: DC | PRN
  Administered 2014-05-25 (×2): via INTRAVENOUS

## 2014-05-25 MED ORDER — FENTANYL CITRATE 0.05 MG/ML IJ SOLN
INTRAMUSCULAR | Status: DC | PRN
  Administered 2014-05-25: 100 ug via INTRAVENOUS
  Administered 2014-05-25: 50 ug via INTRAVENOUS

## 2014-05-25 MED ORDER — SODIUM CHLORIDE 0.9 % IJ SOLN: 3.0000 mL | INTRAMUSCULAR | Status: DC | PRN

## 2014-05-25 MED ORDER — PANTOPRAZOLE SODIUM 40 MG PO TBEC: 40.0000 mg | DELAYED_RELEASE_TABLET | Freq: Every day | ORAL | Status: DC | PRN

## 2014-05-25 MED ORDER — HYDROCODONE-ACETAMINOPHEN 5-325 MG PO TABS: 1.0000 | ORAL_TABLET | ORAL | Status: DC | PRN

## 2014-05-25 MED ORDER — OXYCODONE-ACETAMINOPHEN 5-325 MG PO TABS: 1.0000 | ORAL_TABLET | ORAL | Status: DC | PRN

## 2014-05-25 MED ORDER — ACETAMINOPHEN 325 MG PO TABS: 650.0000 mg | ORAL_TABLET | ORAL | Status: DC | PRN

## 2014-05-25 MED ORDER — ACETAMINOPHEN 650 MG RE SUPP: 650.0000 mg | RECTAL | Status: DC | PRN

## 2014-05-25 MED ORDER — CYCLOBENZAPRINE HCL 10 MG PO TABS: 10.0000 mg | ORAL_TABLET | Freq: Three times a day (TID) | ORAL | Status: DC | PRN

## 2014-05-25 MED ORDER — ONDANSETRON HCL 4 MG/2ML IJ SOLN
INTRAMUSCULAR | Status: AC
  Filled 2014-05-25: qty 2

## 2014-05-25 MED ORDER — NEOSTIGMINE METHYLSULFATE 10 MG/10ML IV SOLN
INTRAVENOUS | Status: AC
  Filled 2014-05-25: qty 1

## 2014-05-25 MED ORDER — MAGNESIUM HYDROXIDE 400 MG/5ML PO SUSP: 30.0000 mL | Freq: Every day | ORAL | Status: DC | PRN

## 2014-05-25 MED ORDER — GLYCOPYRROLATE 0.2 MG/ML IJ SOLN
INTRAMUSCULAR | Status: DC | PRN
  Administered 2014-05-25: 0.4 mg via INTRAVENOUS

## 2014-05-25 MED ORDER — ACETAMINOPHEN 10 MG/ML IV SOLN
INTRAVENOUS | Status: AC
  Filled 2014-05-25: qty 100

## 2014-05-25 MED ORDER — LIDOCAINE-EPINEPHRINE 1 %-1:100000 IJ SOLN
INTRAMUSCULAR | Status: DC | PRN
  Administered 2014-05-25: 10 mL

## 2014-05-25 MED ORDER — LORATADINE 10 MG PO TABS
10.0000 mg | ORAL_TABLET | Freq: Every day | ORAL | Status: DC
  Filled 2014-05-25 (×2): qty 1

## 2014-05-25 MED ORDER — MORPHINE SULFATE 4 MG/ML IJ SOLN: 4.0000 mg | INTRAMUSCULAR | Status: DC | PRN

## 2014-05-25 MED ORDER — HYDROMORPHONE HCL PF 1 MG/ML IJ SOLN: 0.2500 mg | INTRAMUSCULAR | Status: DC | PRN

## 2014-05-25 MED ORDER — VITAMIN D3 25 MCG (1000 UNIT) PO TABS
1000.0000 [IU] | ORAL_TABLET | Freq: Every day | ORAL | Status: DC
  Filled 2014-05-25 (×2): qty 1

## 2014-05-25 MED ORDER — LEVOTHYROXINE SODIUM 75 MCG PO TABS
75.0000 ug | ORAL_TABLET | Freq: Every day | ORAL | Status: DC
  Filled 2014-05-25 (×2): qty 1

## 2014-05-25 MED ORDER — LACTATED RINGERS IV SOLN
INTRAVENOUS | Status: DC
  Administered 2014-05-25: 08:00:00 via INTRAVENOUS

## 2014-05-25 MED ORDER — FENTANYL 25 MCG/HR TD PT72: 25.0000 ug | MEDICATED_PATCH | TRANSDERMAL | Status: DC

## 2014-05-25 MED ORDER — SODIUM CHLORIDE 0.9 % IV SOLN: 250.0000 mL | INTRAVENOUS | Status: DC

## 2014-05-25 MED ORDER — ONDANSETRON HCL 4 MG/2ML IJ SOLN: 4.0000 mg | Freq: Four times a day (QID) | INTRAMUSCULAR | Status: DC | PRN

## 2014-05-25 MED ORDER — SIMVASTATIN 20 MG PO TABS
20.0000 mg | ORAL_TABLET | Freq: Every day | ORAL | Status: DC
  Administered 2014-05-25: 20 mg via ORAL
  Filled 2014-05-25 (×2): qty 1

## 2014-05-25 MED ORDER — HEMOSTATIC AGENTS (NO CHARGE) OPTIME
TOPICAL | Status: DC | PRN
  Administered 2014-05-25: 1 via TOPICAL

## 2014-05-25 MED ORDER — EPHEDRINE SULFATE 50 MG/ML IJ SOLN
INTRAMUSCULAR | Status: AC
  Filled 2014-05-25: qty 1

## 2014-05-25 MED ORDER — THROMBIN 5000 UNITS EX SOLR
CUTANEOUS | Status: DC | PRN
  Administered 2014-05-25 (×2): 5000 [IU] via TOPICAL

## 2014-05-25 MED ORDER — LIDOCAINE HCL (CARDIAC) 20 MG/ML IV SOLN
INTRAVENOUS | Status: DC | PRN
  Administered 2014-05-25: 60 mg via INTRAVENOUS

## 2014-05-25 MED ORDER — BISACODYL 10 MG RE SUPP: 10.0000 mg | Freq: Every day | RECTAL | Status: DC | PRN

## 2014-05-25 MED ORDER — MENTHOL 3 MG MT LOZG: 1.0000 | LOZENGE | OROMUCOSAL | Status: DC | PRN

## 2014-05-25 MED ORDER — SODIUM CHLORIDE 0.9 % IJ SOLN
INTRAMUSCULAR | Status: AC
  Filled 2014-05-25: qty 10

## 2014-05-25 MED ORDER — SODIUM CHLORIDE 0.9 % IR SOLN
Status: DC | PRN
  Administered 2014-05-25: 12:00:00

## 2014-05-25 MED ORDER — BUPIVACAINE HCL (PF) 0.5 % IJ SOLN
INTRAMUSCULAR | Status: DC | PRN
  Administered 2014-05-25: 10 mL

## 2014-05-25 MED ORDER — SODIUM CHLORIDE 0.9 % IJ SOLN
3.0000 mL | Freq: Two times a day (BID) | INTRAMUSCULAR | Status: DC
  Administered 2014-05-25: 3 mL via INTRAVENOUS

## 2014-05-25 MED ORDER — ARTIFICIAL TEARS OP OINT
TOPICAL_OINTMENT | OPHTHALMIC | Status: AC
  Filled 2014-05-25: qty 3.5

## 2014-05-25 MED ORDER — CEFAZOLIN SODIUM-DEXTROSE 2-3 GM-% IV SOLR
INTRAVENOUS | Status: AC
  Filled 2014-05-25: qty 50

## 2014-05-25 MED ORDER — GLYCOPYRROLATE 0.2 MG/ML IJ SOLN
INTRAMUSCULAR | Status: AC
  Filled 2014-05-25: qty 3

## 2014-05-25 MED ORDER — SUCCINYLCHOLINE CHLORIDE 20 MG/ML IJ SOLN
INTRAMUSCULAR | Status: DC | PRN
  Administered 2014-05-25: 100 mg via INTRAVENOUS

## 2014-05-25 MED ORDER — KETOROLAC TROMETHAMINE 30 MG/ML IJ SOLN
INTRAMUSCULAR | Status: AC
  Filled 2014-05-25: qty 1

## 2014-05-25 MED ORDER — SUCCINYLCHOLINE CHLORIDE 20 MG/ML IJ SOLN
INTRAMUSCULAR | Status: AC
  Filled 2014-05-25: qty 1

## 2014-05-25 MED ORDER — PREDNISOLONE ACETATE 1 % OP SUSP
1.0000 [drp] | Freq: Four times a day (QID) | OPHTHALMIC | Status: DC
  Administered 2014-05-25: 1 [drp] via OPHTHALMIC
  Filled 2014-05-25: qty 1

## 2014-05-25 MED ORDER — KETOROLAC TROMETHAMINE 30 MG/ML IJ SOLN
30.0000 mg | Freq: Once | INTRAMUSCULAR | Status: AC
  Administered 2014-05-25: 30 mg via INTRAVENOUS

## 2014-05-25 MED ORDER — ARTIFICIAL TEARS OP OINT
TOPICAL_OINTMENT | OPHTHALMIC | Status: DC | PRN
  Administered 2014-05-25: 1 via OPHTHALMIC

## 2014-05-25 MED ORDER — ALUM & MAG HYDROXIDE-SIMETH 200-200-20 MG/5ML PO SUSP: 30.0000 mL | Freq: Four times a day (QID) | ORAL | Status: DC | PRN

## 2014-05-25 MED ORDER — MUPIROCIN 2 % EX OINT
TOPICAL_OINTMENT | CUTANEOUS | Status: AC
  Filled 2014-05-25: qty 22

## 2014-05-25 MED ORDER — HYDROXYZINE HCL 25 MG PO TABS: 50.0000 mg | ORAL_TABLET | ORAL | Status: DC | PRN

## 2014-05-25 MED ORDER — SODIUM CHLORIDE 0.9 % IV SOLN: INTRAVENOUS | Status: DC

## 2014-05-25 SURGICAL SUPPLY — 65 items
ADH SKN CLS APL DERMABOND .7 (GAUZE/BANDAGES/DRESSINGS)
ADH SKN CLS LQ APL DERMABOND (GAUZE/BANDAGES/DRESSINGS) ×2
APL SKNCLS STERI-STRIP NONHPOA (GAUZE/BANDAGES/DRESSINGS)
BAG DECANTER FOR FLEXI CONT (MISCELLANEOUS) ×2 IMPLANT
BENZOIN TINCTURE PRP APPL 2/3 (GAUZE/BANDAGES/DRESSINGS) IMPLANT
BLADE 10 SAFETY STRL DISP (BLADE) ×2 IMPLANT
BLADE SURG ROTATE 9660 (MISCELLANEOUS) IMPLANT
BRUSH SCRUB EZ PLAIN DRY (MISCELLANEOUS) ×2 IMPLANT
BUR ACORN 6.0 ACORN (BURR) IMPLANT
BUR ACRON 5.0MM COATED (BURR) IMPLANT
BUR MATCHSTICK NEURO 3.0 LAGG (BURR) ×2 IMPLANT
CANISTER SUCT 3000ML (MISCELLANEOUS) ×2 IMPLANT
CONT SPEC 4OZ CLIKSEAL STRL BL (MISCELLANEOUS) IMPLANT
DERMABOND ADHESIVE PROPEN (GAUZE/BANDAGES/DRESSINGS) ×2
DERMABOND ADVANCED (GAUZE/BANDAGES/DRESSINGS)
DERMABOND ADVANCED .7 DNX12 (GAUZE/BANDAGES/DRESSINGS) IMPLANT
DERMABOND ADVANCED .7 DNX6 (GAUZE/BANDAGES/DRESSINGS) IMPLANT
DRAPE LAPAROTOMY 100X72X124 (DRAPES) ×2 IMPLANT
DRAPE MICROSCOPE LEICA (MISCELLANEOUS) ×2 IMPLANT
DRAPE POUCH INSTRU U-SHP 10X18 (DRAPES) ×2 IMPLANT
DRSG EMULSION OIL 3X3 NADH (GAUZE/BANDAGES/DRESSINGS) IMPLANT
ELECT REM PT RETURN 9FT ADLT (ELECTROSURGICAL) ×2
ELECTRODE REM PT RTRN 9FT ADLT (ELECTROSURGICAL) ×1 IMPLANT
GAUZE SPONGE 4X4 16PLY XRAY LF (GAUZE/BANDAGES/DRESSINGS) IMPLANT
GLOVE BIOGEL PI IND STRL 8 (GLOVE) ×1 IMPLANT
GLOVE BIOGEL PI INDICATOR 8 (GLOVE) ×1
GLOVE ECLIPSE 7.5 STRL STRAW (GLOVE) ×2 IMPLANT
GLOVE EXAM NITRILE LRG STRL (GLOVE) IMPLANT
GLOVE EXAM NITRILE MD LF STRL (GLOVE) IMPLANT
GLOVE EXAM NITRILE XL STR (GLOVE) IMPLANT
GLOVE EXAM NITRILE XS STR PU (GLOVE) IMPLANT
GOWN STRL REUS W/ TWL LRG LVL3 (GOWN DISPOSABLE) ×1 IMPLANT
GOWN STRL REUS W/ TWL XL LVL3 (GOWN DISPOSABLE) IMPLANT
GOWN STRL REUS W/TWL 2XL LVL3 (GOWN DISPOSABLE) IMPLANT
GOWN STRL REUS W/TWL LRG LVL3 (GOWN DISPOSABLE) ×6
GOWN STRL REUS W/TWL XL LVL3 (GOWN DISPOSABLE) ×6
KIT BASIN OR (CUSTOM PROCEDURE TRAY) ×2 IMPLANT
KIT ROOM TURNOVER OR (KITS) ×2 IMPLANT
NDL HYPO 18GX1.5 BLUNT FILL (NEEDLE) IMPLANT
NDL SPNL 18GX3.5 QUINCKE PK (NEEDLE) ×1 IMPLANT
NDL SPNL 22GX3.5 QUINCKE BK (NEEDLE) ×1 IMPLANT
NEEDLE HYPO 18GX1.5 BLUNT FILL (NEEDLE) IMPLANT
NEEDLE SPNL 18GX3.5 QUINCKE PK (NEEDLE) ×2 IMPLANT
NEEDLE SPNL 22GX3.5 QUINCKE BK (NEEDLE) ×2 IMPLANT
NS IRRIG 1000ML POUR BTL (IV SOLUTION) ×2 IMPLANT
PACK LAMINECTOMY NEURO (CUSTOM PROCEDURE TRAY) ×2 IMPLANT
PAD ARMBOARD 7.5X6 YLW CONV (MISCELLANEOUS) ×6 IMPLANT
PATTIES SURGICAL .5 X1 (DISPOSABLE) ×2 IMPLANT
RUBBERBAND STERILE (MISCELLANEOUS) ×4 IMPLANT
SPONGE GAUZE 4X4 12PLY (GAUZE/BANDAGES/DRESSINGS) IMPLANT
SPONGE LAP 4X18 X RAY DECT (DISPOSABLE) IMPLANT
SPONGE SURGIFOAM ABS GEL SZ50 (HEMOSTASIS) ×2 IMPLANT
STRIP CLOSURE SKIN 1/2X4 (GAUZE/BANDAGES/DRESSINGS) IMPLANT
SUT PROLENE 6 0 BV (SUTURE) IMPLANT
SUT VIC AB 0 CT1 18XCR BRD8 (SUTURE) IMPLANT
SUT VIC AB 0 CT1 8-18 (SUTURE) ×2
SUT VIC AB 1 CT1 18XBRD ANBCTR (SUTURE) ×1 IMPLANT
SUT VIC AB 1 CT1 8-18 (SUTURE) ×2
SUT VIC AB 2-0 CP2 18 (SUTURE) ×3 IMPLANT
SUT VIC AB 3-0 SH 8-18 (SUTURE) IMPLANT
SYR 20ML ECCENTRIC (SYRINGE) ×2 IMPLANT
SYR 5ML LL (SYRINGE) IMPLANT
TOWEL OR 17X24 6PK STRL BLUE (TOWEL DISPOSABLE) ×2 IMPLANT
TOWEL OR 17X26 10 PK STRL BLUE (TOWEL DISPOSABLE) ×2 IMPLANT
WATER STERILE IRR 1000ML POUR (IV SOLUTION) ×2 IMPLANT

## 2014-05-25 NOTE — Anesthesia Preprocedure Evaluation (Signed)
Anesthesia Evaluation  Patient identified by MRN, date of birth, ID band Patient awake    Reviewed: Allergy & Precautions, H&P , NPO status , Patient's Chart, lab work & pertinent test results  Airway Mallampati: II      Dental   Pulmonary  breath sounds clear to auscultation        Cardiovascular hypertension, Rhythm:Regular Rate:Normal     Neuro/Psych    GI/Hepatic Neg liver ROS, hiatal hernia, GERD-  ,  Endo/Other  Hypothyroidism   Renal/GU negative Renal ROS     Musculoskeletal   Abdominal   Peds  Hematology   Anesthesia Other Findings   Reproductive/Obstetrics                           Anesthesia Physical Anesthesia Plan  ASA: III  Anesthesia Plan: General   Post-op Pain Management:    Induction: Intravenous  Airway Management Planned: Oral ETT  Additional Equipment:   Intra-op Plan:   Post-operative Plan: Extubation in OR  Informed Consent: I have reviewed the patients History and Physical, chart, labs and discussed the procedure including the risks, benefits and alternatives for the proposed anesthesia with the patient or authorized representative who has indicated his/her understanding and acceptance.   Dental advisory given  Plan Discussed with: CRNA and Anesthesiologist  Anesthesia Plan Comments:         Anesthesia Quick Evaluation

## 2014-05-25 NOTE — Op Note (Signed)
05/25/2014  12:31 PM  PATIENT:  Tammy Allison  79 y.o. female  PRE-OPERATIVE DIAGNOSIS:  Left L3 lumbar herniated disc, lumbar degenerative disease, lumbar spondylosis, lumbar radiculopathy  POST-OPERATIVE DIAGNOSIS:  Left L3 lumbar herniated disc, lumbar degenerative disease, lumbar spondylosis, lumbar radiculopathy  PROCEDURE:  Procedure(s):  Left L3 hemilaminectomy and microdiscectomy, with microdissection, microsurgical technique, and the operating microscope  SURGEON:  Surgeon(s): Hosie Spangle, MD  ASSISTANTS: Eustace Moore, M.D.  ANESTHESIA:   general  EBL:  Total I/O In: 1000 [I.V.:1000] Out: 25 [Blood:25]  BLOOD ADMINISTERED:none  COUNT: Correct per nursing staff  DICTATION: Patient was brought to the operating room and placed under general endotracheal anesthesia. Patient was turned to prone position the lumbar region was prepped with Betadine soap and solution and draped in a sterile fashion. The midline was infiltrated with local anesthetic with epinephrine. A localizing x-ray was taken and the L3 level was identified. Midline incision was made over the L3 level and was carried down through the subcutaneous tissue to the lumbar fascia. The lumbar fascia was incised on the left side and the paraspinal muscles were dissected from the spinous processes and lamina in a subperiosteal fashion. Another x-ray was taken and the L2-3 and L3-4 intralaminar spaces were identified. The operating microscope was draped and brought into the field provided additional magnification, illumination, and visualization. A left L3 hemilaminectomy was performed using the high-speed drill and Kerrison punches. The ligamentum flavum was carefully resected. The underlying thecal sac and nerve root were identified. The disc herniation was identified and the thecal sac was gently retracted medially. We identified the exiting left L3 nerve root, and the disc herniation extending in the ventral lateral  epidural space and into the left L3-4 neural foramen.  We mobilized the free fragment, and was removed in a piecemeal fashion. Using a coronary dilator we were able to mobilize fragments that extended into the neural foramen, as well as rostrally ventral to the exiting L3 nerve root. Once all fragments were removed we will to easily palpate the L3 pedicle, and explore the ventral epidural space and neural foramen, and no further fragments were found, and good decompression of the thecal sac and exiting nerve root had been achieved. Once the discectomy was completed and good decompression of the thecal sac and nerve had been achieved hemostasis was established with the use of bipolar cautery and Gelfoam with thrombin. The Gelfoam was removed and hemostasis confirmed. We then proceeded with closure. Deep fascia was closed with interrupted undyed 1 Vicryl sutures. The subcutaneous and subcuticular layer were closed with interrupted inverted 2-0 undyed Vicryl sutures. The skin edges were approximated with Dermabond. Following surgery the patient was turned back to a supine position to be reversed from the anesthetic extubated and transferred to the recovery room for further care.  PLAN OF CARE: Admit to inpatient   PATIENT DISPOSITION:  PACU - hemodynamically stable.   Delay start of Pharmacological VTE agent (>24hrs) due to surgical blood loss or risk of bleeding:  yes

## 2014-05-25 NOTE — Progress Notes (Signed)
Waiting on Emmett to call back for report. On hold at this time

## 2014-05-25 NOTE — H&P (Signed)
Subjective: Patient is a 79 y.o. female who is admitted for treatment of left L3 lumbar disc herniation, with resulting disabling left lumbar radiculopathy.   Patient's symptoms began 04/17/2014. She been doing work around her home, and developed pain in her low back. The next day she had pain reaming down to the lateral left buttock hip and thigh and to the anterior left knee. She underwent treatment with NSAIDS, physical therapy, and prednisone Dosepak without relief. CT of the abdomen and pelvis was done and revealed the left L3 lumbar disc herniation. She hospitalization for electrolyte and valgus with a sodium of 119 and a potassium of 2.4. Those were corrected, and the patient was seen in the office in consultation regarding the disc herniation and radiculopathy, is admitted now for a left L3 hemilaminectomy and microdiscectomy.  Patient Active Problem List   Diagnosis Date Noted  . Electrolyte abnormality 05/12/2014  . Hypokalemia 05/12/2014  . Hyponatremia 05/12/2014  . Lumbosacral radiculopathy 05/12/2014  . Hematuria 05/12/2014  . Benign essential hypertension 05/12/2014  . Hyperlipidemia 05/12/2014  . Hypothyroid 05/12/2014   Past Medical History  Diagnosis Date  . Hypertension   . Hyperlipidemia   . Hypothyroidism   . GERD (gastroesophageal reflux disease)   . H/O hiatal hernia   . Arthritis   . Irritable bowel syndrome     Past Surgical History  Procedure Laterality Date  . Thyroid surgery    . Fracture surgery      right hip  . Eye surgery Bilateral     cataract surgery w/ lens implant    Prescriptions prior to admission  Medication Sig Dispense Refill  . cetirizine (ZYRTEC) 10 MG tablet Take 10 mg by mouth daily.      . cholecalciferol (VITAMIN D) 1000 UNITS tablet Take 1,000 Units by mouth daily.      Marland Kitchen docusate sodium (COLACE) 100 MG capsule Take 100 mg by mouth 2 (two) times daily as needed for mild constipation.      . fentaNYL (DURAGESIC - DOSED MCG/HR) 25  MCG/HR patch Place 25 mcg onto the skin every 3 (three) days.      Marland Kitchen HYDROcodone-acetaminophen (NORCO/VICODIN) 5-325 MG per tablet Take 1 tablet by mouth every 6 (six) hours as needed for moderate pain (PAIN).      Marland Kitchen levothyroxine (SYNTHROID, LEVOTHROID) 50 MCG tablet Take 75 mcg by mouth daily before breakfast.       . omega-3 acid ethyl esters (LOVAZA) 1 G capsule Take 1 g by mouth daily.       . pantoprazole (PROTONIX) 40 MG tablet Take 40 mg by mouth daily as needed (for acid reflux).      . potassium chloride SA (K-DUR,KLOR-CON) 20 MEQ tablet Take 20 mEq by mouth daily.      . prednisoLONE acetate (PRED FORTE) 1 % ophthalmic suspension Place 1 drop into the right eye 4 (four) times daily.       . simvastatin (ZOCOR) 20 MG tablet Take 20 mg by mouth at bedtime.       Allergies  Allergen Reactions  . Sulfa Antibiotics Hives    History  Substance Use Topics  . Smoking status: Never Smoker   . Smokeless tobacco: Never Used  . Alcohol Use: No    History reviewed. No pertinent family history.   Review of Systems A comprehensive review of systems was negative.  Objective: Vital signs in last 24 hours: Temp:  [98.3 F (36.8 C)] 98.3 F (36.8 C) (07/09 0800) Pulse  Rate:  [84] 84 (07/09 0800) Resp:  [18] 18 (07/09 0800) BP: (152)/(86) 152/86 mmHg (07/09 0800) SpO2:  [99 %] 99 % (07/09 0800) Weight:  [61.644 kg (135 lb 14.4 oz)] 61.644 kg (135 lb 14.4 oz) (07/09 0735)  EXAM: Patient well-developed well-nourished white female in no acute distress. Lungs are clear to auscultation , the patient has symmetrical respiratory excursion. Heart has a regular rate and rhythm normal S1 and S2 no murmur.   Abdomen is soft nontender nondistended bowel sounds are present. Extremity examination shows no clubbing cyanosis or edema. Motor examination shows 5 over 5 strength in the lower extremities including the iliopsoas quadriceps dorsiflexor extensor hallicus  longus and plantar flexor bilaterally.  Sensation is decreased to pinprick in the medial aspect of the left leg; otherwise intact to pinprick through the distal lower extremities.. Reflexes are symmetrical bilaterally. No pathologic reflexes are present. Gait and stance both favor the left lower extremity.   Data Review:CBC    Component Value Date/Time   WBC 9.6 05/13/2014 0500   RBC 4.32 05/13/2014 0500   HGB 13.2 05/13/2014 0500   HCT 37.7 05/13/2014 0500   PLT 184 05/13/2014 0500   MCV 87.3 05/13/2014 0500   MCH 30.6 05/13/2014 0500   MCHC 35.0 05/13/2014 0500   RDW 12.6 05/13/2014 0500   LYMPHSABS 1.4 05/12/2014 1939   MONOABS 0.9 05/12/2014 1939   EOSABS 0.0 05/12/2014 1939   BASOSABS 0.0 05/12/2014 1939                          BMET    Component Value Date/Time   NA 131* 05/15/2014 0431   K 4.6 05/15/2014 0431   CL 95* 05/15/2014 0431   CO2 27 05/15/2014 0431   GLUCOSE 120* 05/15/2014 0431   BUN 5* 05/15/2014 0431   CREATININE 0.67 05/15/2014 0431   CALCIUM 7.9* 05/15/2014 0431   GFRNONAA 83* 05/15/2014 0431   GFRAA >90 05/15/2014 0431     Assessment/Plan: Patient with a left L3 lumbar disc herniation, the fragment behind the body of L3, with resulting left lumbar radiculopathy. The patient is admitted now for a left L3 and laminectomy and microdiscectomy.  I've discussed with the patient the nature of his condition, the nature the surgical procedure, the typical length of surgery, hospital stay, and overall recuperation. We discussed limitations postoperatively. I discussed risks of surgery including risks of infection, bleeding, possibly need for transfusion, the risk of nerve root dysfunction with pain, weakness, numbness, or paresthesias, or risk of dural tear and CSF leakage and possible need for further surgery, the risk of recurrent disc herniation and the possible need for further surgery, and the risk of anesthetic complications including myocardial infarction, stroke, pneumonia, and death. Understanding all this the patient  does wish to proceed with surgery and is admitted for such.    Hosie Spangle, MD 05/25/2014 10:22 AM

## 2014-05-25 NOTE — Anesthesia Procedure Notes (Signed)
Procedure Name: Intubation Date/Time: 05/25/2014 11:06 AM Performed by: Scheryl Darter Pre-anesthesia Checklist: Patient identified, Emergency Drugs available, Suction available, Patient being monitored and Timeout performed Patient Re-evaluated:Patient Re-evaluated prior to inductionOxygen Delivery Method: Circle system utilized Preoxygenation: Pre-oxygenation with 100% oxygen Intubation Type: IV induction Ventilation: Mask ventilation without difficulty Laryngoscope Size: Miller and 3 Grade View: Grade II Tube size: 7.5 mm Number of attempts: 1 Airway Equipment and Method: Stylet Placement Confirmation: ETT inserted through vocal cords under direct vision,  positive ETCO2 and breath sounds checked- equal and bilateral Secured at: 22 cm Tube secured with: Tape Dental Injury: Teeth and Oropharynx as per pre-operative assessment

## 2014-05-25 NOTE — Anesthesia Postprocedure Evaluation (Signed)
  Anesthesia Post-op Note  Patient: Tammy Allison  Procedure(s) Performed: Procedure(s): LUMBAR LAMINECTOMY/DECOMPRESSION MICRODISCECTOMY 1 LEVEL   lumbar three (Left)  Patient Location: PACU  Anesthesia Type:General  Level of Consciousness: awake  Airway and Oxygen Therapy: Patient Spontanous Breathing  Post-op Pain: mild  Post-op Assessment: Post-op Vital signs reviewed  Post-op Vital Signs: Reviewed  Last Vitals:  Filed Vitals:   05/25/14 1245  BP: 177/75  Pulse: 104  Temp:   Resp: 26    Complications: No apparent anesthesia complications

## 2014-05-25 NOTE — Progress Notes (Signed)
Filed Vitals:   05/25/14 1329 05/25/14 1340 05/25/14 1411 05/25/14 1717  BP: 154/65  166/68 156/82  Pulse: 84  90 91  Temp:  97.8 F (36.6 C) 97.7 F (36.5 C) 98.5 F (36.9 C)  TempSrc:      Resp: 18  18 16   Height:      Weight:      SpO2: 100%  95% 98%    Patient with substantial improvement in left lumbar radicular pain. Minimal incisional discomfort. Wound clean and dry. Patient has voided. Patient has ambulated in the halls.  Plan: Encouraged to continue to ambulate in the halls. We'll continue to progress through postoperative recovery.  Hosie Spangle, MD 05/25/2014, 6:02 PM

## 2014-05-25 NOTE — Progress Notes (Signed)
Pt reports placing new Fentanyl patch yesterday, 05/24/14. Patch noted to left upper chest area.

## 2014-05-25 NOTE — Transfer of Care (Signed)
Immediate Anesthesia Transfer of Care Note  Patient: Tammy Allison Course  Procedure(s) Performed: Procedure(s): LUMBAR LAMINECTOMY/DECOMPRESSION MICRODISCECTOMY 1 LEVEL   lumbar three (Left)  Patient Location: PACU  Anesthesia Type:General  Level of Consciousness: awake, alert , oriented and sedated  Airway & Oxygen Therapy: Patient Spontanous Breathing and Patient connected to nasal cannula oxygen  Post-op Assessment: Report given to PACU RN, Post -op Vital signs reviewed and stable and Patient moving all extremities  Post vital signs: Reviewed and stable  Complications: No apparent anesthesia complications

## 2014-05-26 ENCOUNTER — Encounter (HOSPITAL_COMMUNITY): Payer: Self-pay | Admitting: Neurosurgery

## 2014-05-26 MED ORDER — HYDROCODONE-ACETAMINOPHEN 5-325 MG PO TABS: 1.0000 | ORAL_TABLET | ORAL | Status: DC | PRN

## 2014-05-26 NOTE — Discharge Summary (Signed)
Physician Discharge Summary  Patient ID: Tammy Allison MRN: [id number] DOB/AGE: March 04, 1935 79 y.o.  Admit date: 05/25/2014 Discharge date: 05/26/2014  Admission Diagnoses:  Left L3 lumbar herniated disc, lumbar degenerative disease, lumbar spondylosis, lumbar radiculopathy  Discharge Diagnoses:  Left L3 lumbar herniated disc, lumbar degenerative disease, lumbar spondylosis, lumbar radiculopathy  Active Problems:   HNP (herniated nucleus pulposus), lumbar   Discharged Condition: good  Hospital Course: Patient admitted, underwent a left L3 hemilaminectomy and microdiscectomy. Postoperatively she is done well. She is up and ambulating. We've given her instructions regarding wound care and activities following discharge. She does return for followup with me in 3-4 weeks in the office.  Discharge Exam: Blood pressure 131/83, pulse 98, temperature 98.2 F (36.8 C), temperature source Oral, resp. rate 16, height 5\' 4"  (1.626 m), weight 61.644 kg (135 lb 14.4 oz), SpO2 94.00%.  Disposition: Home     Medication List         cetirizine 10 MG tablet  Commonly known as:  ZYRTEC  Take 10 mg by mouth daily.     cholecalciferol 1000 UNITS tablet  Commonly known as:  VITAMIN D  Take 1,000 Units by mouth daily.     docusate sodium 100 MG capsule  Commonly known as:  COLACE  Take 100 mg by mouth 2 (two) times daily as needed for mild constipation.     fentaNYL 25 MCG/HR patch  Commonly known as:  DURAGESIC - dosed mcg/hr  Place 25 mcg onto the skin every 3 (three) days.     HYDROcodone-acetaminophen 5-325 MG per tablet  Commonly known as:  NORCO/VICODIN  Take 1 tablet by mouth every 6 (six) hours as needed for moderate pain (PAIN).     HYDROcodone-acetaminophen 5-325 MG per tablet  Commonly known as:  NORCO/VICODIN  Take 1-2 tablets by mouth every 4 (four) hours as needed for moderate pain or severe pain.     levothyroxine 50 MCG tablet  Commonly known as:  SYNTHROID, LEVOTHROID   Take 75 mcg by mouth daily before breakfast.     omega-3 acid ethyl esters 1 G capsule  Commonly known as:  LOVAZA  Take 1 g by mouth daily.     pantoprazole 40 MG tablet  Commonly known as:  PROTONIX  Take 40 mg by mouth daily as needed (for acid reflux).     potassium chloride SA 20 MEQ tablet  Commonly known as:  K-DUR,KLOR-CON  Take 20 mEq by mouth daily.     prednisoLONE acetate 1 % ophthalmic suspension  Commonly known as:  PRED FORTE  Place 1 drop into the right eye 4 (four) times daily.     simvastatin 20 MG tablet  Commonly known as:  ZOCOR  Take 20 mg by mouth at bedtime.         Signed: Hosie Spangle, MD 05/26/2014, 9:51 AM

## 2014-05-26 NOTE — Discharge Instructions (Signed)
Wound Care Leave incision open to air. You may shower. Do not scrub directly on incision.  Do not put any creams, lotions, or ointments on incision. Activity Walk each and every day, increasing distance each day. No lifting greater than 5 lbs.  Avoid bending, arching, and twisting. No driving for 2 weeks; may ride as a passenger locally. If provided with back brace, wear when out of bed.  It is not necessary to wear in bed. Diet Resume your normal diet.  Return to Work Will be discussed at you follow up appointment. Call Your Doctor If Any of These Occur Redness, drainage, or swelling at the wound.  Temperature greater than 101 degrees. Severe pain not relieved by pain medication. Incision starts to come apart. Follow Up Appt Call today for appointment in 3 weeks ([phone]) or for problems.  If you have any hardware placed in your spine, you will need an x-ray before your appointment.  Laminectomy - Laminotomy - Discectomy Your surgeon has decided that a laminectomy (entire lamina removal) or laminotomy (partial lamina removal) is the best treatment for your back problem. These procedures involve removal of bone to relieve pressure on nerve roots. It allows the surgeon access to parts of the spine where other problems are located. This could be an injured disc (the cartilage-like structures located between the bones of the back). In this surgery your surgeon removes a part of the boney arch that surrounds your spinal canal. This may be compressing nerve roots. In some cases, the surgeon will remove the disc and fuse (stick together) vertebral bodies (the bones of your back) to make the spine more stable. The type of procedure you will need is usually decided prior to surgery, however modifications may be necessary. The time in surgery depends on the findings in surgery and the procedure necessary to correct the problems. DISCECTOMY For people with disc problems, the surgeon removes the  portion of the disc that is causing the pressure on the nerve root. Some surgeons perform a micro (small) discectomy, which may require removal of only a small portion of the lamina. A disc nucleus (center) may also be removed either through a needle (percutaneous discectomy) or by injecting an enzyme called chymopapain into the disc. Chymopapain is an enzyme that dissolves the disc. For people with back instability, the surgeon fuses vertebrae that are next to each other with tiny pieces of bone. These are used as bone grafts on the facets, or between the vertebrae. When this heals, the bones will no longer be able to move. These bone chips are often taken from the pelvic bones. Bones and bone grafts grow into one unit, stabilizing the segments of the spinal column. LET YOUR CAREGIVER KNOW ABOUT:  Allergies.  Medicines taken including herbs, eyedrops, over-the-counter medicines, and creams.  Use of steroids (by mouth or creams).  Previous problems with anesthetics or numbing medicine.  Possibility of pregnancy, if this applies.  History of blood clots (thrombophlebitis).  History of bleeding or blood problems.  Previous surgery.  Other health problems. RISKS AND COMPLICATIONS Your caregiver will discuss possible risks and complications with you before surgery. In addition to the usual risks of anesthesia, other common risks and complications include:  Blood loss and replacement.  Temporary increase in pain due to surgery.  Uncorrected back pain.  Infection.  New nerve damage (tingling, numbness, and pain). BEFORE THE PROCEDURE  Stop smoking at least 1 week prior to surgery. This lowers risk during surgery.  Your caregiver  may advise that you stop taking certain medicines that may affect the outcome of the surgery and your ability to heal. For example, you may need to stop taking anti-inflammatories, such as aspirin, because of possible bleeding problems. Other medicines may have  interactions with anesthesia.  Tell your caregiver if you have been on steroids for long periods of time. Often, additional steroids are administered intravenously before and during the procedure to prevent complications.  You should be present 60 minutes prior to your procedure or as directed. AFTER THE PROCEDURE After surgery, you will be taken to the recovery area where a nurse will watch and check your progress. Generally, you will be allowed to go home within 1 week barring other problems. HOME CARE INSTRUCTIONS   Check the surgical cut (incision) twice a day for signs of infection. Some signs may include a bad smelling, greenish or yellowish discharge from the wound; increased pain or increased redness over the incision site; an opening of the incision; flu-like symptoms; or a temperature above 101.5 F (38.6 C).  Change your bandages in about 24 to 36 hours following surgery or as directed.  You may shower once the bandage is removed or as directed. Avoid bathtubs, swimming pools, and hot tubs for 3 weeks or until your incision has healed completely. If you have stitches (sutures) or staples they may be removed 2 to 3 weeks after surgery, or as directed by your caregiver.  Follow your caregiver's instructions for activities, exercises, and physical therapy.  Weight reduction may be beneficial if you are overweight.  Walking is permitted. You may use a treadmill without an incline. Cut down on activities if you have discomfort. You may also go up and down stairs as tolerated.  Do not lift anything heavier than 10 to 15 pounds. Avoid bending or twisting at the waist. Always bend your knees.  Maintain strength and range of motion as instructed.  No driving is permitted for 2 to 3 weeks, or as directed by your caregiver. You may be a passenger for 20 to 30 minute trips. Lying back in the passenger seat may be more comfortable for you.  Limit your sitting to 20 to 30 minute intervals.  You should lie down or walk in between sitting periods. There are no limitations for sitting in a recliner chair.  Only take over-the-counter or prescription medicines for pain, discomfort, or fever as directed by your caregiver. SEEK MEDICAL CARE IF:   There is increased bleeding (more than a small spot) from the wound.  You notice redness, swelling, or increasing pain in the wound.  Pus is coming from the wound.  An unexplained oral temperature above 102 F (38.9 C) develops.  You notice a bad smell coming from the wound or dressing. SEEK IMMEDIATE MEDICAL CARE IF:   You develop a rash.  You have difficulty breathing.  You have any allergic problems. Document Released: 10/31/2000 Document Revised: 01/26/2012 Document Reviewed: 08/29/2008 [facility] Patient Information 2015 [place], [place]. This information is not intended to replace advice given to you by your health care provider. Make sure you discuss any questions you have with your health care provider.

## 2014-05-26 NOTE — Progress Notes (Signed)
Pt doing well. Pt given D/C instructions with Rx, verbal understanding of teaching was given. Pt's IV was removed prior to D/C. Pt D/C'd home via wheelchair @ 1050 per MD order. Pt is stable @ D/C and has no other needs. Holli Humbles, RN

## 2014-05-29 ENCOUNTER — Emergency Department (HOSPITAL_COMMUNITY): Payer: Medicare Other

## 2014-05-29 ENCOUNTER — Observation Stay (HOSPITAL_COMMUNITY): Payer: Medicare Other

## 2014-05-29 ENCOUNTER — Inpatient Hospital Stay (HOSPITAL_COMMUNITY)
Admission: EM | Admit: 2014-05-29 | Discharge: 2014-06-01 | DRG: 641 | Disposition: A | Discharge status: Home / Self Care | Payer: Medicare Other | Attending: Internal Medicine | Admitting: Internal Medicine

## 2014-05-29 ENCOUNTER — Encounter (HOSPITAL_COMMUNITY): Payer: Self-pay | Admitting: Emergency Medicine

## 2014-05-29 DIAGNOSIS — E871 Hypo-osmolality and hyponatremia: Secondary | ICD-10-CM | POA: Diagnosis present

## 2014-05-29 DIAGNOSIS — I700 Atherosclerosis of aorta: Secondary | ICD-10-CM | POA: Diagnosis present

## 2014-05-29 DIAGNOSIS — R059 Cough, unspecified: Secondary | ICD-10-CM | POA: Diagnosis not present

## 2014-05-29 DIAGNOSIS — IMO0002 Reserved for concepts with insufficient information to code with codable children: Secondary | ICD-10-CM | POA: Diagnosis not present

## 2014-05-29 DIAGNOSIS — E440 Moderate protein-calorie malnutrition: Secondary | ICD-10-CM | POA: Diagnosis present

## 2014-05-29 DIAGNOSIS — E878 Other disorders of electrolyte and fluid balance, not elsewhere classified: Secondary | ICD-10-CM | POA: Diagnosis present

## 2014-05-29 DIAGNOSIS — R5383 Other fatigue: Secondary | ICD-10-CM

## 2014-05-29 DIAGNOSIS — J984 Other disorders of lung: Secondary | ICD-10-CM | POA: Diagnosis not present

## 2014-05-29 DIAGNOSIS — R197 Diarrhea, unspecified: Secondary | ICD-10-CM | POA: Diagnosis present

## 2014-05-29 DIAGNOSIS — K219 Gastro-esophageal reflux disease without esophagitis: Secondary | ICD-10-CM | POA: Diagnosis present

## 2014-05-29 DIAGNOSIS — K449 Diaphragmatic hernia without obstruction or gangrene: Secondary | ICD-10-CM | POA: Diagnosis present

## 2014-05-29 DIAGNOSIS — I10 Essential (primary) hypertension: Secondary | ICD-10-CM

## 2014-05-29 DIAGNOSIS — E039 Hypothyroidism, unspecified: Secondary | ICD-10-CM | POA: Diagnosis present

## 2014-05-29 DIAGNOSIS — R05 Cough: Secondary | ICD-10-CM | POA: Diagnosis not present

## 2014-05-29 DIAGNOSIS — R6889 Other general symptoms and signs: Secondary | ICD-10-CM | POA: Diagnosis not present

## 2014-05-29 DIAGNOSIS — E876 Hypokalemia: Secondary | ICD-10-CM | POA: Diagnosis not present

## 2014-05-29 DIAGNOSIS — M5126 Other intervertebral disc displacement, lumbar region: Secondary | ICD-10-CM

## 2014-05-29 DIAGNOSIS — R000 Tachycardia, unspecified: Secondary | ICD-10-CM | POA: Diagnosis present

## 2014-05-29 DIAGNOSIS — R5381 Other malaise: Secondary | ICD-10-CM | POA: Diagnosis not present

## 2014-05-29 DIAGNOSIS — R110 Nausea: Secondary | ICD-10-CM | POA: Diagnosis not present

## 2014-05-29 DIAGNOSIS — I498 Other specified cardiac arrhythmias: Secondary | ICD-10-CM | POA: Diagnosis not present

## 2014-05-29 DIAGNOSIS — Z9889 Other specified postprocedural states: Secondary | ICD-10-CM

## 2014-05-29 DIAGNOSIS — E785 Hyperlipidemia, unspecified: Secondary | ICD-10-CM | POA: Diagnosis present

## 2014-05-29 DIAGNOSIS — K589 Irritable bowel syndrome without diarrhea: Secondary | ICD-10-CM | POA: Diagnosis present

## 2014-05-29 DIAGNOSIS — R531 Weakness: Secondary | ICD-10-CM | POA: Diagnosis present

## 2014-05-29 DIAGNOSIS — R911 Solitary pulmonary nodule: Secondary | ICD-10-CM | POA: Diagnosis not present

## 2014-05-29 DIAGNOSIS — R319 Hematuria, unspecified: Secondary | ICD-10-CM | POA: Diagnosis not present

## 2014-05-29 LAB — CBC WITH DIFFERENTIAL/PLATELET
Basophils Absolute: 0 10*3/uL (ref 0.0–0.1)
Basophils Relative: 0 % (ref 0–1)
Eosinophils Absolute: 0 10*3/uL (ref 0.0–0.7)
Eosinophils Relative: 0 % (ref 0–5)
HCT: 35.6 % — ABNORMAL LOW (ref 36.0–46.0)
Hemoglobin: 12.3 g/dL (ref 12.0–15.0)
Lymphocytes Relative: 8 % — ABNORMAL LOW (ref 12–46)
Lymphs Abs: 0.6 10*3/uL — ABNORMAL LOW (ref 0.7–4.0)
MCH: 31 pg (ref 26.0–34.0)
MCHC: 34.6 g/dL (ref 30.0–36.0)
MCV: 89.7 fL (ref 78.0–100.0)
Monocytes Absolute: 0.7 10*3/uL (ref 0.1–1.0)
Monocytes Relative: 10 % (ref 3–12)
Neutro Abs: 6 10*3/uL (ref 1.7–7.7)
Neutrophils Relative %: 82 % — ABNORMAL HIGH (ref 43–77)
Platelets: 234 10*3/uL (ref 150–400)
RBC: 3.97 MIL/uL (ref 3.87–5.11)
RDW: 13.5 % (ref 11.5–15.5)
WBC: 7.3 10*3/uL (ref 4.0–10.5)

## 2014-05-29 LAB — MAGNESIUM: Magnesium: 1.8 mg/dL (ref 1.5–2.5)

## 2014-05-29 LAB — COMPREHENSIVE METABOLIC PANEL
ALT: 11 U/L (ref 0–35)
AST: 14 U/L (ref 0–37)
Albumin: 3.1 g/dL — ABNORMAL LOW (ref 3.5–5.2)
Alkaline Phosphatase: 50 U/L (ref 39–117)
Anion gap: 12 (ref 5–15)
BUN: 6 mg/dL (ref 6–23)
CO2: 27 mEq/L (ref 19–32)
Calcium: 8.9 mg/dL (ref 8.4–10.5)
Chloride: 95 mEq/L — ABNORMAL LOW (ref 96–112)
Creatinine, Ser: 0.6 mg/dL (ref 0.50–1.10)
GFR calc Af Amer: >90 mL/min (ref >90)
GFR calc non Af Amer: 86 mL/min — ABNORMAL LOW (ref >90)
Glucose, Bld: 135 mg/dL — ABNORMAL HIGH (ref 70–99)
Potassium: 3.2 mEq/L — ABNORMAL LOW (ref 3.7–5.3)
Sodium: 134 mEq/L — ABNORMAL LOW (ref 137–147)
Total Bilirubin: 0.6 mg/dL (ref 0.3–1.2)
Total Protein: 6.3 g/dL (ref 6.0–8.3)

## 2014-05-29 LAB — URINALYSIS, ROUTINE W REFLEX MICROSCOPIC
Bilirubin Urine: NEGATIVE
Glucose, UA: NEGATIVE mg/dL
Ketones, ur: NEGATIVE mg/dL
Leukocytes, UA: NEGATIVE
Nitrite: NEGATIVE
Protein, ur: NEGATIVE mg/dL
Specific Gravity, Urine: 1.002 — ABNORMAL LOW (ref 1.005–1.030)
Urobilinogen, UA: 0.2 mg/dL (ref 0.0–1.0)
pH: 7.5 (ref 5.0–8.0)

## 2014-05-29 LAB — URINE MICROSCOPIC-ADD ON

## 2014-05-29 LAB — T4: T4, Total: 12.4 ug/dL (ref 5.0–12.5)

## 2014-05-29 LAB — TSH: TSH: 0.332 u[IU]/mL — ABNORMAL LOW (ref 0.350–4.500)

## 2014-05-29 LAB — D-DIMER, QUANTITATIVE: D-Dimer, Quant: 1.03 ug/mL-FEU — ABNORMAL HIGH (ref 0.00–0.48)

## 2014-05-29 LAB — TROPONIN I: Troponin I: <0.3 ng/mL (ref <0.30)

## 2014-05-29 MED ORDER — POTASSIUM CHLORIDE IN NACL 20-0.9 MEQ/L-% IV SOLN
INTRAVENOUS | Status: DC
  Administered 2014-05-29 – 2014-06-01 (×6): via INTRAVENOUS
  Filled 2014-05-29 (×7): qty 1000

## 2014-05-29 MED ORDER — POTASSIUM CHLORIDE CRYS ER 20 MEQ PO TBCR
40.0000 meq | EXTENDED_RELEASE_TABLET | Freq: Once | ORAL | Status: AC
  Administered 2014-05-29: 40 meq via ORAL
  Filled 2014-05-29: qty 2

## 2014-05-29 MED ORDER — ONDANSETRON HCL 4 MG/2ML IJ SOLN
4.0000 mg | Freq: Four times a day (QID) | INTRAMUSCULAR | Status: DC | PRN
  Administered 2014-05-29 – 2014-05-31 (×5): 4 mg via INTRAVENOUS
  Filled 2014-05-29 (×6): qty 2

## 2014-05-29 MED ORDER — METOPROLOL TARTRATE 12.5 MG HALF TABLET
12.5000 mg | ORAL_TABLET | Freq: Two times a day (BID) | ORAL | Status: DC
  Administered 2014-05-29 – 2014-06-01 (×7): 12.5 mg via ORAL
  Filled 2014-05-29 (×8): qty 1

## 2014-05-29 MED ORDER — DOCUSATE SODIUM 100 MG PO CAPS
100.0000 mg | ORAL_CAPSULE | Freq: Two times a day (BID) | ORAL | Status: DC | PRN
  Filled 2014-05-29: qty 1

## 2014-05-29 MED ORDER — HYDRALAZINE HCL 20 MG/ML IJ SOLN
10.0000 mg | Freq: Three times a day (TID) | INTRAMUSCULAR | Status: DC | PRN
  Filled 2014-05-29: qty 0.5

## 2014-05-29 MED ORDER — SIMVASTATIN 20 MG PO TABS
20.0000 mg | ORAL_TABLET | Freq: Every day | ORAL | Status: DC
  Administered 2014-05-29 – 2014-05-31 (×3): 20 mg via ORAL
  Filled 2014-05-29 (×4): qty 1

## 2014-05-29 MED ORDER — KETOROLAC TROMETHAMINE 15 MG/ML IJ SOLN
15.0000 mg | Freq: Three times a day (TID) | INTRAMUSCULAR | Status: DC | PRN
  Administered 2014-05-29 – 2014-05-31 (×2): 15 mg via INTRAVENOUS
  Filled 2014-05-29 (×2): qty 1

## 2014-05-29 MED ORDER — IOHEXOL 350 MG/ML SOLN
100.0000 mL | Freq: Once | INTRAVENOUS | Status: AC | PRN
  Administered 2014-05-29: 100 mL via INTRAVENOUS

## 2014-05-29 MED ORDER — LORATADINE 10 MG PO TABS
10.0000 mg | ORAL_TABLET | Freq: Every day | ORAL | Status: DC
  Administered 2014-05-30 – 2014-06-01 (×3): 10 mg via ORAL
  Filled 2014-05-29 (×3): qty 1

## 2014-05-29 MED ORDER — SODIUM CHLORIDE 0.9 % IJ SOLN: 3.0000 mL | Freq: Two times a day (BID) | INTRAMUSCULAR | Status: DC

## 2014-05-29 MED ORDER — SODIUM CHLORIDE 0.9 % IV SOLN: INTRAVENOUS | Status: DC

## 2014-05-29 MED ORDER — SODIUM CHLORIDE 0.9 % IV BOLUS (SEPSIS)
1000.0000 mL | Freq: Once | INTRAVENOUS | Status: AC
  Administered 2014-05-29: 1000 mL via INTRAVENOUS

## 2014-05-29 MED ORDER — HYDROCODONE-ACETAMINOPHEN 5-325 MG PO TABS
1.0000 | ORAL_TABLET | Freq: Four times a day (QID) | ORAL | Status: DC | PRN
  Administered 2014-05-31: 1 via ORAL
  Filled 2014-05-29: qty 1

## 2014-05-29 MED ORDER — ONDANSETRON HCL 4 MG/2ML IJ SOLN
4.0000 mg | Freq: Once | INTRAMUSCULAR | Status: AC
  Administered 2014-05-29: 4 mg via INTRAVENOUS
  Filled 2014-05-29: qty 2

## 2014-05-29 MED ORDER — FENTANYL 25 MCG/HR TD PT72
25.0000 ug | MEDICATED_PATCH | TRANSDERMAL | Status: DC
  Administered 2014-05-29: 25 ug via TRANSDERMAL
  Filled 2014-05-29: qty 1

## 2014-05-29 MED ORDER — LEVOTHYROXINE SODIUM 75 MCG PO TABS
75.0000 ug | ORAL_TABLET | Freq: Every day | ORAL | Status: DC
  Administered 2014-05-30 – 2014-05-31 (×2): 75 ug via ORAL
  Filled 2014-05-29 (×3): qty 1

## 2014-05-29 MED ORDER — ALBUTEROL SULFATE (2.5 MG/3ML) 0.083% IN NEBU
2.5000 mg | INHALATION_SOLUTION | RESPIRATORY_TRACT | Status: DC | PRN
  Administered 2014-05-29: 2.5 mg via RESPIRATORY_TRACT
  Filled 2014-05-29: qty 3

## 2014-05-29 MED ORDER — PANTOPRAZOLE SODIUM 40 MG PO TBEC
40.0000 mg | DELAYED_RELEASE_TABLET | Freq: Every morning | ORAL | Status: DC
  Administered 2014-05-30 – 2014-06-01 (×3): 40 mg via ORAL
  Filled 2014-05-29 (×3): qty 1

## 2014-05-29 MED ORDER — ENOXAPARIN SODIUM 40 MG/0.4ML ~~LOC~~ SOLN
40.0000 mg | SUBCUTANEOUS | Status: DC
Start: 2014-05-29 — End: 2014-05-30
  Administered 2014-05-29: 40 mg via SUBCUTANEOUS
  Filled 2014-05-29 (×2): qty 0.4

## 2014-05-29 MED ORDER — VITAMIN D3 25 MCG (1000 UNIT) PO TABS
1000.0000 [IU] | ORAL_TABLET | Freq: Every morning | ORAL | Status: DC
  Administered 2014-05-31: 1000 [IU] via ORAL
  Filled 2014-05-29 (×4): qty 1

## 2014-05-29 MED ORDER — ONDANSETRON HCL 4 MG PO TABS: 4.0000 mg | ORAL_TABLET | Freq: Four times a day (QID) | ORAL | Status: DC | PRN

## 2014-05-29 MED ORDER — PREDNISOLONE ACETATE 1 % OP SUSP
1.0000 [drp] | Freq: Two times a day (BID) | OPHTHALMIC | Status: DC
  Administered 2014-05-29 – 2014-06-01 (×6): 1 [drp] via OPHTHALMIC
  Filled 2014-05-29: qty 1

## 2014-05-29 MED ORDER — OMEGA-3-ACID ETHYL ESTERS 1 G PO CAPS
1.0000 g | ORAL_CAPSULE | Freq: Every morning | ORAL | Status: DC
  Filled 2014-05-29 (×3): qty 1

## 2014-05-29 NOTE — ED Notes (Signed)
Pt presents via c/o abnormal lab. Pt was coming from home at this time. Pt was admitted for low potassium and sodium approx 2 weeks ago. Last Thursday, pt had back surgery and has not been able to eat much since then. Pt's PCP wanted her to come here because he believed her potassium and sodium may be low again. Pt is no acute distress at this time, pt c/o weakness only.

## 2014-05-29 NOTE — ED Notes (Signed)
MD at bedside. 

## 2014-05-29 NOTE — ED Notes (Signed)
X-ray at bedside

## 2014-05-29 NOTE — ED Provider Notes (Signed)
CSN: [id number]     Arrival date & time 05/29/14  7564 History   First MD Initiated Contact with Patient 05/29/14 [phone]     Chief Complaint  Patient presents with  . Abnormal Lab     (Consider location/radiation/quality/duration/timing/severity/associated sxs/prior Treatment) HPI 79 year old female presents with worsening diffuse weakness. She states that she had back surgery 4 days ago and is felt improved. However she started to decline over last couple days. This feels similar to when she had low potassium and sodium a couple weeks ago. She had to be admitted for these electrolyte disturbances. She's not had any vomiting or diarrhea. She's been somewhat constipated after surgery but had a normal bowel movement this morning. Has had a cough without dyspnea since yesterday. No fevers. No focal weakness. No urinary symptoms. Back is sore but describes pain as mild.  Past Medical History  Diagnosis Date  . Hypertension   . Hyperlipidemia   . Hypothyroidism   . GERD (gastroesophageal reflux disease)   . H/O hiatal hernia   . Arthritis   . Irritable bowel syndrome    Past Surgical History  Procedure Laterality Date  . Thyroid surgery    . Fracture surgery      right hip  . Eye surgery Bilateral     cataract surgery w/ lens implant  . Lumbar laminectomy/decompression microdiscectomy Left 05/25/2014    Procedure: LUMBAR LAMINECTOMY/DECOMPRESSION MICRODISCECTOMY 1 LEVEL   lumbar three;  Surgeon: Hosie Spangle, MD;  Location: [facility];  Service: Neurosurgery;  Laterality: Left;   No family history on file. History  Substance Use Topics  . Smoking status: Never Smoker   . Smokeless tobacco: Never Used  . Alcohol Use: No   OB History   Grav Para Term Preterm Abortions TAB SAB Ect Mult Living                 Review of Systems  Constitutional: Positive for fatigue. Negative for fever.  Respiratory: Positive for cough. Negative for shortness of breath.   Cardiovascular:  Negative for chest pain.  Gastrointestinal: Positive for constipation. Negative for nausea, vomiting, abdominal pain and diarrhea.  Neurological: Positive for weakness.  All other systems reviewed and are negative.     Allergies  Sulfa antibiotics  Home Medications   Prior to Admission medications   Medication Sig Start Date End Date Taking? Authorizing Provider  cetirizine (ZYRTEC) 10 MG tablet Take 10 mg by mouth daily.    Historical Provider, MD  cholecalciferol (VITAMIN D) 1000 UNITS tablet Take 1,000 Units by mouth daily.    Historical Provider, MD  docusate sodium (COLACE) 100 MG capsule Take 100 mg by mouth 2 (two) times daily as needed for mild constipation.    Historical Provider, MD  fentaNYL (DURAGESIC - DOSED MCG/HR) 25 MCG/HR patch Place 25 mcg onto the skin every 3 (three) days.    Historical Provider, MD  HYDROcodone-acetaminophen (NORCO/VICODIN) 5-325 MG per tablet Take 1 tablet by mouth every 6 (six) hours as needed for moderate pain (PAIN).    Historical Provider, MD  HYDROcodone-acetaminophen (NORCO/VICODIN) 5-325 MG per tablet Take 1-2 tablets by mouth every 4 (four) hours as needed for moderate pain or severe pain. 05/26/14   Hosie Spangle, MD  levothyroxine (SYNTHROID, LEVOTHROID) 50 MCG tablet Take 75 mcg by mouth daily before breakfast.     Historical Provider, MD  omega-3 acid ethyl esters (LOVAZA) 1 G capsule Take 1 g by mouth daily.     Historical  Provider, MD  pantoprazole (PROTONIX) 40 MG tablet Take 40 mg by mouth daily as needed (for acid reflux).    Historical Provider, MD  potassium chloride SA (K-DUR,KLOR-CON) 20 MEQ tablet Take 20 mEq by mouth daily.    Historical Provider, MD  prednisoLONE acetate (PRED FORTE) 1 % ophthalmic suspension Place 1 drop into the right eye 4 (four) times daily.     Historical Provider, MD  simvastatin (ZOCOR) 20 MG tablet Take 20 mg by mouth at bedtime.    Historical Provider, MD   BP 162/69  Pulse 116  Temp(Src) 98.2 F  (36.8 C) (Oral)  Resp 15  SpO2 100% Physical Exam  Vitals reviewed. Constitutional: She is oriented to person, place, and time. She appears well-developed and well-nourished. No distress.  HENT:  Head: Normocephalic and atraumatic.  Right Ear: External ear normal.  Left Ear: External ear normal.  Nose: Nose normal.  Eyes: Right eye exhibits no discharge. Left eye exhibits no discharge.  Cardiovascular: Regular rhythm and normal heart sounds.  Tachycardia present.   Pulmonary/Chest: Effort normal and breath sounds normal. She has no wheezes. She has no rales.  Abdominal: Soft. She exhibits no distension. There is no tenderness.  Neurological: She is alert and oriented to person, place, and time.  Skin: Skin is warm and dry.    ED Course  Procedures (including critical care time) Labs Review Labs Reviewed  CBC WITH DIFFERENTIAL - Abnormal; Notable for the following:    HCT 35.6 (*)    Neutrophils Relative % 82 (*)    Lymphocytes Relative 8 (*)    Lymphs Abs 0.6 (*)    All other components within normal limits  COMPREHENSIVE METABOLIC PANEL - Abnormal; Notable for the following:    Sodium 134 (*)    Potassium 3.2 (*)    Chloride 95 (*)    Glucose, Bld 135 (*)    Albumin 3.1 (*)    GFR calc non Af Amer 86 (*)    All other components within normal limits  URINALYSIS, ROUTINE W REFLEX MICROSCOPIC - Abnormal; Notable for the following:    Specific Gravity, Urine 1.002 (*)    Hgb urine dipstick TRACE (*)    All other components within normal limits  MAGNESIUM  URINE MICROSCOPIC-ADD ON    Imaging Review Diagnostic report text  CLINICAL DATA: Cough. Nonsmoker.  EXAM: PORTABLE CHEST - 1 VIEW  COMPARISON: 05/25/2014  FINDINGS: Similar appearance an apical pleural thickening without associated bony destruction.  No infiltrate, congestive heart failure or pneumothorax. No plain film evidence of pulmonary malignancy. If there is a persistent unexplained cough followup  imaging may be considered.  Heart size within normal limits.  Calcified aorta.  IMPRESSION: No acute abnormality noted. Please see above   Electronically Signed By: Chauncey Cruel M.D. On: 05/29/2014 09:48   EKG Interpretation   Date/Time:  Monday May 29 2014 09:10:18 EDT Ventricular Rate:  110 PR Interval:  120 QRS Duration: 74 QT Interval:  342 QTC Calculation: 463 R Axis:   65 Text Interpretation:  Sinus tachycardia Atrial premature complex No  significant change since last tracing Confirmed by Alok Minshall  MD, Korrin Waterfield  ([id number]) on 05/29/2014 9:15:27 AM      MDM   Final diagnoses:  Weakness  Hypokalemia  Tachycardia    Patient's electrolytes shows mild hypokalemia, otherwise no significant abnormality to cause her symptoms. However, after fluids and giving her oral food she still having persistent tachycardia in the 100s. Does not seem related to  pain as she has only mild to moderate pain. She does have thyroid disease and is on Synthroid, will send TSH and free T4. She's not have any shortness of breath or chest pain to suggest a cardiac or pulmonary etiology. However she is persistently tachycardic we'll admit to the hospitalist.    Ephraim Hamburger, MD 05/29/14 1700

## 2014-05-29 NOTE — ED Notes (Signed)
Bed: KF27 Expected date:  Expected time:  Means of arrival:  Comments: EMS- abnormal lab

## 2014-05-29 NOTE — H&P (Signed)
[facility] History and Physical  Tammy Allison MVE:[id number] DOB: 14-Mar-1935 DOA: 05/29/2014  Referring physician:  PCP: [name], MD  Specialists:   Chief Complaint: cough, weakness  HPI: Tammy Allison is a 79 y.o. female with PMH of HTN, HPL, GERD, hypothyroidism, recent Left L3 hemilaminectomy (7/9) presented with generalized weakness, fatigue, non productive cough, chills, poor appetite for 2 days; denies chest pain, no SOB, had mild nausea, no vomiting, no diarrhea, no abdominal pain, no focal neurological weakness or paraesthesia;  -ED work up showed tachycardia, hypo K;   Review of Systems: The patient denies anorexia, fever, weight loss,, vision loss, decreased hearing, hoarseness, chest pain, syncope, dyspnea on exertion, peripheral edema, balance deficits, hemoptysis, abdominal pain, melena, hematochezia, severe indigestion/heartburn, hematuria, incontinence, genital sores, muscle weakness, suspicious skin lesions, transient blindness, difficulty walking, depression, unusual weight change, abnormal bleeding, enlarged lymph nodes, angioedema, and breast masses.    Past Medical History  Diagnosis Date  . Hypertension   . Hyperlipidemia   . Hypothyroidism   . GERD (gastroesophageal reflux disease)   . H/O hiatal hernia   . Arthritis   . Irritable bowel syndrome    Past Surgical History  Procedure Laterality Date  . Thyroid surgery    . Fracture surgery      right hip  . Eye surgery Bilateral     cataract surgery w/ lens implant  . Lumbar laminectomy/decompression microdiscectomy Left 05/25/2014    Procedure: LUMBAR LAMINECTOMY/DECOMPRESSION MICRODISCECTOMY 1 LEVEL   lumbar three;  Surgeon: Hosie Spangle, MD;  Location: [facility];  Service: Neurosurgery;  Laterality: Left;   Social History:  reports that she has never smoked. She has never used smokeless tobacco. She reports that she does not drink alcohol or use illicit drugs. Home;  where does  patient live--home, ALF, SNF? and with whom if at home? Yes;  Can patient participate in ADLs?  Allergies  Allergen Reactions  . Sulfa Antibiotics Hives    History reviewed. No pertinent family history.  (be sure to complete)  Prior to Admission medications   Medication Sig Start Date End Date Taking? Authorizing Provider  cetirizine (ZYRTEC) 10 MG tablet Take 10 mg by mouth every morning.    Yes Historical Provider, MD  cholecalciferol (VITAMIN D) 1000 UNITS tablet Take 1,000 Units by mouth every morning.    Yes Historical Provider, MD  docusate sodium (COLACE) 100 MG capsule Take 100 mg by mouth 2 (two) times daily as needed for mild constipation.   Yes Historical Provider, MD  fentaNYL (DURAGESIC - DOSED MCG/HR) 25 MCG/HR patch Place 25 mcg onto the skin every 3 (three) days.   Yes Historical Provider, MD  HYDROcodone-acetaminophen (NORCO/VICODIN) 5-325 MG per tablet Take 1 tablet by mouth every 6 (six) hours as needed for moderate pain.    Yes Historical Provider, MD  levothyroxine (SYNTHROID, LEVOTHROID) 75 MCG tablet Take 75 mcg by mouth daily before breakfast.   Yes Historical Provider, MD  omega-3 acid ethyl esters (LOVAZA) 1 G capsule Take 1 g by mouth every morning.    Yes Historical Provider, MD  pantoprazole (PROTONIX) 40 MG tablet Take 40 mg by mouth every morning.    Yes Historical Provider, MD  prednisoLONE acetate (PRED FORTE) 1 % ophthalmic suspension Place 1 drop into the right eye 2 (two) times daily.    Yes Historical Provider, MD  simvastatin (ZOCOR) 20 MG tablet Take 20 mg by mouth at bedtime.   Yes Historical Provider, MD  Physical Exam: Filed Vitals:   05/29/14 1332  BP: 157/55  Pulse: 113  Temp: 98.1 F (36.7 C)  Resp: 25     General:  alert  Eyes: eom-i  ENT: no oral ulcers   Neck: supple, no JVD  Cardiovascular: s1,s2 tachycardia   Respiratory: CTA BL  Abdomen: soft, nt, nd   Skin: no rash   Musculoskeletal: no LE edema  Psychiatric: no  hallucinations   Neurologic: CN 2-12 intact; motor 5/5 BL symmetric   Labs on Admission:  Basic Metabolic Panel:  Recent Labs Lab 05/29/14 0957  NA 134*  K 3.2*  CL 95*  CO2 27  GLUCOSE 135*  BUN 6  CREATININE 0.60  CALCIUM 8.9  MG 1.8   Liver Function Tests:  Recent Labs Lab 05/29/14 0957  AST 14  ALT 11  ALKPHOS 50  BILITOT 0.6  PROT 6.3  ALBUMIN 3.1*   No results found for this basename: LIPASE, AMYLASE,  in the last 168 hours No results found for this basename: AMMONIA,  in the last 168 hours CBC:  Recent Labs Lab 05/29/14 0957  WBC 7.3  NEUTROABS 6.0  HGB 12.3  HCT 35.6*  MCV 89.7  PLT 234   Cardiac Enzymes: No results found for this basename: CKTOTAL, CKMB, CKMBINDEX, TROPONINI,  in the last 168 hours  BNP (last 3 results) No results found for this basename: PROBNP,  in the last 8760 hours CBG: No results found for this basename: GLUCAP,  in the last 168 hours  Radiological Exams on Admission: No results found.  EKG: Independently reviewed.   Assessment/Plan Principal Problem:   Weakness Active Problems:   Electrolyte abnormality   Hypokalemia   Tachycardia    79 y.o. female with PMH of HTN, HPL, GERD, hypothyroidism, recent Left L3 hemilaminectomy (7/9) presented with generalized weakness, fatigue, non productive cough, chills for 2 days, found to have Hypo K, tachycardia   1. Hypo K likely due to poor oral intake, dehydration (Pt has not been eating much for few days) -replace lytes; recheck in AM; encourage poor intake;   2. Tachycardia of unclear etiology; no chest pain; no hypoxia at this time; CXR: no clear infiltrates   -check trop, d dimer if elevated needs CT chest r/o PE due to recent surgery/hosp ;  -tele monitor r/o arrythmia; checkTSH, Free T4  3. Hypothyroidism; no recent TSH; cont levothyroxine, chest tsh, free t4 4. HTN; currently off meds; h/o taking benazepril;  -add low dose BB for BP, HR control; titrate per  reponse  5. GERD, cont PPI 6. Recent Left L3 hemilaminectomy (7/9); no neuro symptoms, exam is non focal; wound looks clean;  -cont wound care; neurosurgery follow up OP 7. Generalized weakness, fatigue poor appetite;  -obtain PT eval; supportive care    None;  if consultant consulted, please document name and whether formally or informally consulted  Code Status: full (must indicate code status--if unknown or must be presumed, indicate so) Family Communication:  D/w patient (indicate person spoken with, if applicable, with phone number if by telephone) Disposition Plan: home 24-48 hrs pend test results  (indicate anticipated LOS)  Time spent: >35 minutes   Kinnie Feil [facility] Pager [phone]  If 7PM-7AM, please contact night-coverage [email or web address] Password [email or web address] 05/29/2014, 1:59 PM

## 2014-05-29 NOTE — Progress Notes (Signed)
[facility] PROGRESS NOTE  VARONICA SIHARATH XTA:[id number] DOB: 11/08/1935 DOA: 05/29/2014 PCP: [name], MD  D dimer mild elevated, Pt is still tachycardia, tachypnea;  -Obtain CTA chest r/o PE  Kinnie Feil  [facility] Pager [phone]. If 7PM-7AM, please contact night-coverage at [email or web address], password [facility] 05/29/2014, 3:23 PM  LOS: 0 days

## 2014-05-30 DIAGNOSIS — E785 Hyperlipidemia, unspecified: Secondary | ICD-10-CM | POA: Diagnosis present

## 2014-05-30 DIAGNOSIS — E871 Hypo-osmolality and hyponatremia: Secondary | ICD-10-CM

## 2014-05-30 DIAGNOSIS — E440 Moderate protein-calorie malnutrition: Secondary | ICD-10-CM | POA: Diagnosis present

## 2014-05-30 DIAGNOSIS — R5381 Other malaise: Secondary | ICD-10-CM | POA: Diagnosis not present

## 2014-05-30 DIAGNOSIS — I10 Essential (primary) hypertension: Secondary | ICD-10-CM | POA: Diagnosis present

## 2014-05-30 DIAGNOSIS — M5126 Other intervertebral disc displacement, lumbar region: Secondary | ICD-10-CM

## 2014-05-30 DIAGNOSIS — IMO0002 Reserved for concepts with insufficient information to code with codable children: Secondary | ICD-10-CM | POA: Diagnosis not present

## 2014-05-30 DIAGNOSIS — E878 Other disorders of electrolyte and fluid balance, not elsewhere classified: Secondary | ICD-10-CM

## 2014-05-30 DIAGNOSIS — E039 Hypothyroidism, unspecified: Secondary | ICD-10-CM | POA: Diagnosis present

## 2014-05-30 DIAGNOSIS — K449 Diaphragmatic hernia without obstruction or gangrene: Secondary | ICD-10-CM | POA: Diagnosis present

## 2014-05-30 DIAGNOSIS — R000 Tachycardia, unspecified: Secondary | ICD-10-CM | POA: Diagnosis present

## 2014-05-30 DIAGNOSIS — R319 Hematuria, unspecified: Secondary | ICD-10-CM | POA: Diagnosis not present

## 2014-05-30 DIAGNOSIS — K219 Gastro-esophageal reflux disease without esophagitis: Secondary | ICD-10-CM | POA: Diagnosis present

## 2014-05-30 DIAGNOSIS — R197 Diarrhea, unspecified: Secondary | ICD-10-CM | POA: Diagnosis present

## 2014-05-30 DIAGNOSIS — R110 Nausea: Secondary | ICD-10-CM | POA: Diagnosis not present

## 2014-05-30 DIAGNOSIS — I700 Atherosclerosis of aorta: Secondary | ICD-10-CM | POA: Diagnosis present

## 2014-05-30 DIAGNOSIS — E876 Hypokalemia: Secondary | ICD-10-CM | POA: Diagnosis not present

## 2014-05-30 DIAGNOSIS — K589 Irritable bowel syndrome without diarrhea: Secondary | ICD-10-CM | POA: Diagnosis present

## 2014-05-30 DIAGNOSIS — Z9889 Other specified postprocedural states: Secondary | ICD-10-CM | POA: Diagnosis not present

## 2014-05-30 LAB — BASIC METABOLIC PANEL
Anion gap: 10 (ref 5–15)
BUN: 4 mg/dL — ABNORMAL LOW (ref 6–23)
CO2: 26 mEq/L (ref 19–32)
Calcium: 8.1 mg/dL — ABNORMAL LOW (ref 8.4–10.5)
Chloride: 100 mEq/L (ref 96–112)
Creatinine, Ser: 0.64 mg/dL (ref 0.50–1.10)
GFR calc Af Amer: >90 mL/min (ref >90)
GFR calc non Af Amer: 84 mL/min — ABNORMAL LOW (ref >90)
Glucose, Bld: 121 mg/dL — ABNORMAL HIGH (ref 70–99)
Potassium: 3.4 mEq/L — ABNORMAL LOW (ref 3.7–5.3)
Sodium: 136 mEq/L — ABNORMAL LOW (ref 137–147)

## 2014-05-30 LAB — CBC
HCT: 35.1 % — ABNORMAL LOW (ref 36.0–46.0)
Hemoglobin: 12.1 g/dL (ref 12.0–15.0)
MCH: 31.4 pg (ref 26.0–34.0)
MCHC: 34.5 g/dL (ref 30.0–36.0)
MCV: 91.2 fL (ref 78.0–100.0)
Platelets: 224 10*3/uL (ref 150–400)
RBC: 3.85 MIL/uL — ABNORMAL LOW (ref 3.87–5.11)
RDW: 13.8 % (ref 11.5–15.5)
WBC: 7.7 10*3/uL (ref 4.0–10.5)

## 2014-05-30 LAB — TROPONIN I: Troponin I: <0.3 ng/mL (ref <0.30)

## 2014-05-30 MED ORDER — BOOST / RESOURCE BREEZE PO LIQD
1.0000 | Freq: Two times a day (BID) | ORAL | Status: DC
  Administered 2014-06-01: 1 via ORAL

## 2014-05-30 MED ORDER — PROMETHAZINE HCL 25 MG/ML IJ SOLN
12.5000 mg | Freq: Four times a day (QID) | INTRAMUSCULAR | Status: DC | PRN
  Administered 2014-05-31: 12.5 mg via INTRAVENOUS
  Filled 2014-05-30: qty 1

## 2014-05-30 NOTE — Evaluation (Signed)
Physical Therapy Evaluation Patient Details Name: Tammy Allison MRN: [id number] DOB: Apr 14, 1935 Today's Date: 05/30/2014   History of Present Illness  79 yo female admitted with hypokalemia, tachycardia, weakness. Hx of L3 hemilaminectomy, microdiscectomy 05/25/14, HTN,hernia.   Clinical Impression  On eval, pt required Min guard assist for mobility-able to ambulate ~500 feet around unit. Pt denied pain in back, LEs. Tolerated activity well. Encouraged pt to ambulate at least one more time with nursing/family supervision. Do not anticipate any follow up PT needs at discharge.     Follow Up Recommendations Supervision - Intermittent;No PT follow up    Equipment Recommendations  None recommended by PT    Recommendations for Other Services       Precautions / Restrictions Precautions Precautions: Back Precaution Comments: pt able to recall all back precautions and logroll technique.  Required Braces or Orthoses: Spinal Brace Spinal Brace: Lumbar corset;Applied in standing position (Small amount of assist to place brace but pt able to operate straps and secure brace) Restrictions Weight Bearing Restrictions: No      Mobility  Bed Mobility Overal bed mobility: Modified Independent                Transfers Overall transfer level: Modified independent                  Ambulation/Gait Ambulation/Gait assistance: Min guard Ambulation Distance (Feet): 500 Feet Assistive device: None Gait Pattern/deviations: Step-through pattern;Decreased stride length     General Gait Details: slow gait speed. close guard for safety  Stairs            Wheelchair Mobility    Modified Rankin (Stroke Patients Only)       Balance                                             Pertinent Vitals/Pain Pt denied pain    Home Living Family/patient expects to be discharged to:: Private residence Living Arrangements: Spouse/significant other Available  Help at Discharge: Family Type of Home: House Home Access: Stairs to enter Entrance Stairs-Rails: Right Entrance Stairs-Number of Steps: 4 Home Layout: One level Home Equipment: Environmental consultant - 2 wheels;Bedside commode;Shower seat - built in      Prior Function Level of Independence: Independent               Journalist, newspaper        Extremity/Trunk Assessment   Upper Extremity Assessment: Overall WFL for tasks assessed           Lower Extremity Assessment: Overall WFL for tasks assessed      Cervical / Trunk Assessment: Normal  Communication   Communication: No difficulties  Cognition Arousal/Alertness: Awake/alert Behavior During Therapy: WFL for tasks assessed/performed Overall Cognitive Status: Within Functional Limits for tasks assessed                      General Comments      Exercises        Assessment/Plan    PT Assessment Patient needs continued PT services  PT Diagnosis Difficulty walking;Generalized weakness   PT Problem List Decreased mobility;Decreased activity tolerance;Decreased strength  PT Treatment Interventions Gait training;Functional mobility training;Therapeutic activities;Therapeutic exercise;Patient/family education   PT Goals (Current goals can be found in the Care Plan section) Acute Rehab PT Goals Patient Stated Goal: home. regain PLOF PT Goal Formulation: With  patient Time For Goal Achievement: 06/06/14 Potential to Achieve Goals: Good    Frequency Min 3X/week   Barriers to discharge        Co-evaluation               End of Session   Activity Tolerance: Patient tolerated treatment well Patient left: in chair;with call bell/phone within reach;with family/visitor present      Functional Assessment Tool Used: clinical judgement Functional Limitation: Mobility: Walking and moving around Mobility: Walking and Moving Around Current Status ([id number]): At least 1 percent but less than 20 percent impaired, limited  or restricted Mobility: Walking and Moving Around Goal Status [phone]): 0 percent impaired, limited or restricted    Time: 1010-1020 PT Time Calculation (min): 10 min   Charges:   PT Evaluation $Initial PT Evaluation Tier I: 1 Procedure PT Treatments $Gait Training: 8-22 mins   PT G Codes:   Functional Assessment Tool Used: clinical judgement Functional Limitation: Mobility: Walking and moving around    [employer], MPT Pager: [phone]

## 2014-05-30 NOTE — Progress Notes (Signed)
[facility] PROGRESS NOTE   Tammy Allison NUU:[id number] DOB: 26-Nov-1934 DOA: 05/29/2014 PCP: [name], MD  HPI/Subjective: Feel nauseous, dry heaving, and generally unwell. Mild hyponatremia and hypokalemia which is improving since yesterday.  Assessment/Plan: Principal Problem:   Weakness Active Problems:   Electrolyte abnormality   Hypokalemia   Tachycardia   Malnutrition of moderate degree    Poor appetite -Patient complains about nausea, poor appetite and generalized weakness. -I am not if the nausea/poor appetite secondary to the fentanyl patch. -Generalized weakness could be secondary to electrolyte disturbances and dehydration/poor nutrition. -Had the recent CT scan done on 05/12/2014, I will not repeat CT scan for now unless her symptoms worsen significantly. -Obtain PT/OT consultation. Add Phenergan for the nausea.  Generalized weakness -There is a symptoms, with electrolytes disturbances. -Patient did have insignificant growth from her urine on 6/27. No fever or leukocytosis. -Will obtain blood and urine culture, if positive will start antibiotics.  Hypokalemia -Likely secondary to poor oral intake. -Replaced eventually, continue IV fluids.  Tachycardia  -No chest pain; no hypoxia at this time; CXR: no clear infiltrates  -D. dimers was elevated but CT scan of the chest showed no evidence of PE. -Tele monitor r/o arrythmia; TSH slightly lower but normal free T4.   Hypothyroidism -Slightly low TSH but normal free T4.  HTN -currently off meds; h/o taking benazepril;  -add low dose BB for BP, HR control; titrate per reponse   GERD, cont PPI   Recent Left L3 hemilaminectomy (7/9) -No neuro symptoms, exam is non focal; wound looks clean;  -Cont wound care; neurosurgery follow up OP     Code Status: Full code  Family Communication: Plan discussed with the patient. Disposition Plan: Remains inpatient   Consultants:  None    Procedures:  None   Antibiotics:  None   Objective: Filed Vitals:   05/30/14 1008  BP: 155/62  Pulse: 98  Temp:   Resp:     Intake/Output Summary (Last 24 hours) at 05/30/14 1139 Last data filed at 05/30/14 0921  Gross per 24 hour  Intake   1590 ml  Output   2650 ml  Net  -1060 ml   Filed Weights   05/29/14 1450  Weight: 59.648 kg (131 lb 8 oz)    Exam: General: Alert and awake, oriented x3, not in any acute distress. HEENT: anicteric sclera, pupils reactive to light and accommodation, EOMI CVS: S1-S2 clear, no murmur rubs or gallops Chest: clear to auscultation bilaterally, no wheezing, rales or rhonchi Abdomen: soft nontender, nondistended, normal bowel sounds, no organomegaly Extremities: no cyanosis, clubbing or edema noted bilaterally Neuro: Cranial nerves II-XII intact, no focal neurological deficits  Data Reviewed: Basic Metabolic Panel:  Recent Labs Lab 05/29/14 0957 05/30/14 0157  NA 134* 136*  K 3.2* 3.4*  CL 95* 100  CO2 27 26  GLUCOSE 135* 121*  BUN 6 4*  CREATININE 0.60 0.64  CALCIUM 8.9 8.1*  MG 1.8  --    Liver Function Tests:  Recent Labs Lab 05/29/14 0957  AST 14  ALT 11  ALKPHOS 50  BILITOT 0.6  PROT 6.3  ALBUMIN 3.1*   No results found for this basename: LIPASE, AMYLASE,  in the last 168 hours No results found for this basename: AMMONIA,  in the last 168 hours CBC:  Recent Labs Lab 05/29/14 0957 05/30/14 0157  WBC 7.3 7.7  NEUTROABS 6.0  --   HGB 12.3 12.1  HCT 35.6* 35.1*  MCV 89.7 91.2  PLT 234 224   Cardiac Enzymes:  Recent Labs Lab 05/29/14 1429 05/30/14 0157  TROPONINI <0.30 <0.30   BNP (last 3 results) No results found for this basename: PROBNP,  in the last 8760 hours CBG: No results found for this basename: GLUCAP,  in the last 168 hours  Micro Recent Results (from the past 240 hour(s))  SURGICAL PCR SCREEN     Status: None   Collection Time    05/25/14  7:42 AM      Result Value Ref  Range Status   MRSA, PCR NEGATIVE  NEGATIVE Final   Staphylococcus aureus NEGATIVE  NEGATIVE Final   Comment:            The Xpert SA Assay (FDA     approved for NASAL specimens     in patients over 44 years of age),     is one component of     a comprehensive surveillance     program.  Test performance has     been validated by [employer] for patients greater     than or equal to 44 year old.     It is not intended     to diagnose infection nor to     guide or monitor treatment.     Studies: Ct Angio Chest Pe W/cm &/or Wo Cm  05/29/2014   CLINICAL DATA:  Weakness. Fatigue. Cough. Chills. Back surgery 1 week ago. Elevated D-dimer.  EXAM: CT ANGIOGRAPHY CHEST WITH CONTRAST  TECHNIQUE: Multidetector CT imaging of the chest was performed using the standard protocol during bolus administration of intravenous contrast. Multiplanar CT image reconstructions and MIPs were obtained to evaluate the vascular anatomy.  CONTRAST:  149mL OMNIPAQUE IOHEXOL 350 MG/ML SOLN  COMPARISON:  05/29/2014 radiographs  FINDINGS: No filling defect is identified in the pulmonary arterial tree to suggest pulmonary embolus. Atherosclerotic aortic arch noted. Small type 1 hiatal hernia. Hypodense segment 7 lesion, 0.4 cm, technically nonspecific, no change from recent CT.  No pathologic upper abdominal adenopathy is observed. There is evidence of old granulomatous disease involving the spleen.  Left apical scarring with punctate scratch have biapical scarring with punctate calcifications on the left. 0.5 by 0.3 x 0.3 cm subpleural pulmonary nodule, right middle lobe along the minor fissure, image 51 series 7. No significant bony abnormality is observed.  Review of the MIP images confirms the above findings.  IMPRESSION: 1. 4 mm right middle lobe nodule is subpleural in location, and accordingly statistically most likely to be a subpleural lymph node. If the patient is at high risk for bronchogenic carcinoma, follow-up  chest CT at 1 year is recommended. If the patient is at low risk, no follow-up is needed. This recommendation follows the consensus statement: Guidelines for Management of Small Pulmonary Nodules Detected on CT Scans: A Statement from the [place] as published in Radiology 2005; 237:395-400. 2. Biapical pleural parenchymal scarring. 3. No embolus or acute thoracic findings identified. 4. Small hiatal hernia.   Electronically Signed   By: Sherryl Barters M.D.   On: 05/29/2014 17:54   Dg Chest Portable 1 View  05/29/2014   CLINICAL DATA:  Cough.  Nonsmoker.  EXAM: PORTABLE CHEST - 1 VIEW  COMPARISON:  05/25/2014  FINDINGS: Similar appearance an apical pleural thickening without associated bony destruction.  No infiltrate, congestive heart failure or pneumothorax. No plain film evidence of pulmonary malignancy. If there is a persistent unexplained cough followup imaging may be considered.  Heart size within normal limits.  Calcified aorta.  IMPRESSION: No acute abnormality noted.  Please see above   Electronically Signed   By: Chauncey Cruel M.D.   On: 05/29/2014 09:48    Scheduled Meds: . cholecalciferol  1,000 Units Oral q morning - 10a  . feeding supplement (RESOURCE BREEZE)  1 Container Oral BID BM  . levothyroxine  75 mcg Oral QAC breakfast  . loratadine  10 mg Oral Daily  . metoprolol tartrate  12.5 mg Oral BID  . omega-3 acid ethyl esters  1 g Oral q morning - 10a  . pantoprazole  40 mg Oral q morning - 10a  . prednisoLONE acetate  1 drop Right Eye BID  . simvastatin  20 mg Oral QHS  . sodium chloride  3 mL Intravenous Q12H   Continuous Infusions: . 0.9 % NaCl with KCl 20 mEq / L 75 mL/hr at 05/30/14 0449       Time spent: 35 minutes    [facility]  [facility] Pager [phone] If 7PM-7AM, please contact night-coverage at [email or web address], password [email or web address] 05/30/2014, 11:39 AM  LOS: 1 day

## 2014-05-30 NOTE — Progress Notes (Signed)
INITIAL NUTRITION ASSESSMENT  DOCUMENTATION CODES Per approved criteria  -Non-severe (moderate) malnutrition in the context of acute illness or injury  Pt meets criteria for moderate MALNUTRITION in the context of acute illness/injury as evidenced by PO intake <75% for > 7 days, 3% body weight loss in 2 weeks.    INTERVENTION: - Recommend Resource Breeze po BID, each supplement provides 250 kcal and 9 grams of protein - Will continue to monitor  NUTRITION DIAGNOSIS: Inadequate oral intake related to nausea/dry heaves as evidenced by PO intake <75%, 3% body weight loss in 2 weeks.   Goal: Pt to meet >/= 90% of their estimated nutrition needs    Monitor:  Total protein/energy intake, labs, weights, GI profile  Reason for Assessment: MST  79 y.o. female  Admitting Dx: Weakness  ASSESSMENT: Tammy Allison is a 79 y.o. female with PMH of HTN, HPL, GERD, hypothyroidism, recent Left L3 hemilaminectomy (7/9) presented with generalized weakness, fatigue, non productive cough, chills, poor appetite for 2 days; denies chest pain, no SOB, had mild nausea, no vomiting, no diarrhea, no abdominal pain, no focal neurological weakness or paraesthesia;   -Pt reported decreased appetite since surgery last Thursday 7/09. Has only been able to tolerate popsicles, jellos, and 1/2 sandwiches. Experiences large amount of dry heaves, which inhibit appetite -Endorsed an unintentional wt loss. Usual body weight is around 139 lbs, and previous records indicate pt has lost 5 lbs in < 2 weeks (3% body weight, significant for time frame) -Tried Boost/Ensure but does not like taste. -Received [employer] during previous admit and was able to tolerate. Will order BID as pt would benefit from nutrient replenishment -Declined additional snacks.  Height: Ht Readings from Last 1 Encounters:  05/29/14 5\' 4"  (1.626 m)    Weight: Wt Readings from Last 1 Encounters:  05/29/14 131 lb 8 oz (59.648 kg)     Ideal Body Weight: 120 lbs  % Ideal Body Weight: 109%  Wt Readings from Last 10 Encounters:  05/29/14 131 lb 8 oz (59.648 kg)  05/25/14 135 lb 14.4 oz (61.644 kg)  05/25/14 135 lb 14.4 oz (61.644 kg)  05/12/14 135 lb 14.4 oz (61.644 kg)    Usual Body Weight: 139 lbs  % Usual Body Weight: 97%  BMI:  Body mass index is 22.56 kg/(m^2).  Estimated Nutritional Needs: Kcal: 1600-1800 Protein: 65-80 gram Fluid: >/=1800 ml/daily  Skin: WDL  Diet Order: Sodium Restricted  EDUCATION NEEDS: -Education needs addressed   Intake/Output Summary (Last 24 hours) at 05/30/14 1051 Last data filed at 05/30/14 0921  Gross per 24 hour  Intake   1590 ml  Output   2650 ml  Net  -1060 ml    Last BM: 7/14   Labs:   Recent Labs Lab 05/29/14 0957 05/30/14 0157  NA 134* 136*  K 3.2* 3.4*  CL 95* 100  CO2 27 26  BUN 6 4*  CREATININE 0.60 0.64  CALCIUM 8.9 8.1*  MG 1.8  --   GLUCOSE 135* 121*    CBG (last 3)  No results found for this basename: GLUCAP,  in the last 72 hours  Scheduled Meds: . cholecalciferol  1,000 Units Oral q morning - 10a  . enoxaparin (LOVENOX) injection  40 mg Subcutaneous Q24H  . fentaNYL  25 mcg Transdermal Q72H  . levothyroxine  75 mcg Oral QAC breakfast  . loratadine  10 mg Oral Daily  . metoprolol tartrate  12.5 mg Oral BID  . omega-3 acid ethyl esters  1 g Oral q morning - 10a  . pantoprazole  40 mg Oral q morning - 10a  . prednisoLONE acetate  1 drop Right Eye BID  . simvastatin  20 mg Oral QHS  . sodium chloride  3 mL Intravenous Q12H    Continuous Infusions: . 0.9 % NaCl with KCl 20 mEq / L 75 mL/hr at 05/30/14 0449    Past Medical History  Diagnosis Date  . Hypertension   . Hyperlipidemia   . Hypothyroidism   . GERD (gastroesophageal reflux disease)   . H/O hiatal hernia   . Arthritis   . Irritable bowel syndrome     Past Surgical History  Procedure Laterality Date  . Thyroid surgery    . Fracture surgery      right  hip  . Eye surgery Bilateral     cataract surgery w/ lens implant  . Lumbar laminectomy/decompression microdiscectomy Left 05/25/2014    Procedure: LUMBAR LAMINECTOMY/DECOMPRESSION MICRODISCECTOMY 1 LEVEL   lumbar three;  Surgeon: Hosie Spangle, MD;  Location: [place] [facility];  Service: Neurosurgery;  Laterality: Left;    Atlee Abide MS RD [place] Clinical Dietitian [phone]

## 2014-05-31 DIAGNOSIS — R110 Nausea: Secondary | ICD-10-CM | POA: Diagnosis present

## 2014-05-31 LAB — BASIC METABOLIC PANEL
Anion gap: 13 (ref 5–15)
BUN: 5 mg/dL — ABNORMAL LOW (ref 6–23)
CO2: 24 mEq/L (ref 19–32)
Calcium: 7.9 mg/dL — ABNORMAL LOW (ref 8.4–10.5)
Chloride: 98 mEq/L (ref 96–112)
Creatinine, Ser: 0.65 mg/dL (ref 0.50–1.10)
GFR calc Af Amer: >90 mL/min (ref >90)
GFR calc non Af Amer: 83 mL/min — ABNORMAL LOW (ref >90)
Glucose, Bld: 127 mg/dL — ABNORMAL HIGH (ref 70–99)
Potassium: 3.3 mEq/L — ABNORMAL LOW (ref 3.7–5.3)
Sodium: 135 mEq/L — ABNORMAL LOW (ref 137–147)

## 2014-05-31 LAB — CBC
HCT: 35 % — ABNORMAL LOW (ref 36.0–46.0)
Hemoglobin: 12 g/dL (ref 12.0–15.0)
MCH: 31.3 pg (ref 26.0–34.0)
MCHC: 34.3 g/dL (ref 30.0–36.0)
MCV: 91.1 fL (ref 78.0–100.0)
Platelets: 222 10*3/uL (ref 150–400)
RBC: 3.84 MIL/uL — ABNORMAL LOW (ref 3.87–5.11)
RDW: 13.8 % (ref 11.5–15.5)
WBC: 6.4 10*3/uL (ref 4.0–10.5)

## 2014-05-31 LAB — CORTISOL: Cortisol, Plasma: 19.3 ug/dL

## 2014-05-31 LAB — CK: Total CK: 60 U/L (ref 7–177)

## 2014-05-31 MED ORDER — METOCLOPRAMIDE HCL 5 MG/ML IJ SOLN
5.0000 mg | Freq: Four times a day (QID) | INTRAMUSCULAR | Status: DC
  Administered 2014-05-31 – 2014-06-01 (×4): 5 mg via INTRAVENOUS
  Filled 2014-05-31 (×3): qty 1
  Filled 2014-05-31 (×3): qty 2
  Filled 2014-05-31: qty 1
  Filled 2014-05-31: qty 2

## 2014-05-31 MED ORDER — LEVOTHYROXINE SODIUM 50 MCG PO TABS
50.0000 ug | ORAL_TABLET | Freq: Every day | ORAL | Status: DC
  Administered 2014-06-01: 50 ug via ORAL
  Filled 2014-05-31 (×2): qty 1

## 2014-05-31 MED ORDER — POTASSIUM CHLORIDE CRYS ER 20 MEQ PO TBCR
40.0000 meq | EXTENDED_RELEASE_TABLET | Freq: Once | ORAL | Status: AC
  Administered 2014-05-31: 40 meq via ORAL
  Filled 2014-05-31: qty 2

## 2014-05-31 MED ORDER — TRAMADOL-ACETAMINOPHEN 37.5-325 MG PO TABS: 2.0000 | ORAL_TABLET | Freq: Four times a day (QID) | ORAL | Status: DC | PRN

## 2014-05-31 NOTE — Progress Notes (Addendum)
[facility] PROGRESS NOTE  Tammy Allison QMV:[id number] DOB: [birth year] DOA: 05/29/2014 PCP: [name], MD  Brief narrative 79 year old female with history of hypertension, hyperlipidemia, GERD, hypothyroidism, recent left  L3 hemilaminectomy on 7/9 presented with generalized weakness with poor appetite, nausea subjective chills and nonproductive cough. In the ED patient was found to be tachycardic with hypokalemia and admitted to hospitalist service.  Assessment/Plan: Generalized weakness with poor appetite and nausea Possibly contributed by poor by mouth intake, dehydration and electrolyte abnormalities. Recent CT scan of the abdomen was unremarkable. Patient has a fentanyl patch for her back pain. Patient continues to have nausea with dry heaving but no vomiting. -Switch Phenergan to scheduled Reglan. Continue gentle hydration. -Seen by nutrition consult and ordered the source these twice a day. Seen by physical therapy and recommended home with supervision. Continue low sodium diet.  Hypokalemia - secondary to poor by mouth intake. Replacing with by mouth and IV fluids. Monitor in a.m.  Hypertension Placed on low-dose metoprolol  Hypothyroidism Noted for low TSH but a normal free T4. I will reduce his Synthroid dose to 50 mcg (was on the 75 mcg at home)  Tachycardia Possibly secondary to dehydration. Currently stable on telemetry. Noted for elevated d-dimer on admission but CT angiogram of the chest was negative for PE. TSH slightly low but with normal free T4. continue  low-dose metoprolol.  GERD Continue PPI  Recent left L3 hemilaminectomy Normal exam and wound looks clean. Followup with neurosurgery as outpatient  DVT prophylaxis: Subcutaneous Lovenox Diet: Low-sodium  Code Status: Full code Family Communication: None at bedside  Disposition: Home possibly tomorrow if nausea  improves   Consultants:  None  Procedures:  None  Antibiotics:  None  HPI/Subjective: Reason seen and examined this morning. Still feels weak and having nausea this morning.  Objective: Filed Vitals:   05/31/14 1417  BP: 153/74  Pulse: 83  Temp: 98.4 F (36.9 C)  Resp: 18    Intake/Output Summary (Last 24 hours) at 05/31/14 1548 Last data filed at 05/31/14 1224  Gross per 24 hour  Intake   1200 ml  Output   2250 ml  Net  -1050 ml   Filed Weights   05/29/14 1450  Weight: 59.648 kg (131 lb 8 oz)    Exam:   General: Elderly female lying in bed appears fatigued  HEENT: No pallor, vital mucosa  Chest: Clear to auscultation bilaterally, no added sounds  CVS: Normal S1 and S2, no murmurs rub or gallop  Abdomen: Soft, nontender, nondistended, bowel sounds present  Extremities: Warm, no edema, lumbar surgical area clean  CNS: Alert and oriented    Data Reviewed: Basic Metabolic Panel:  Recent Labs Lab 05/29/14 0957 05/30/14 0157 05/31/14 0443  NA 134* 136* 135*  K 3.2* 3.4* 3.3*  CL 95* 100 98  CO2 27 26 24   GLUCOSE 135* 121* 127*  BUN 6 4* 5*  CREATININE 0.60 0.64 0.65  CALCIUM 8.9 8.1* 7.9*  MG 1.8  --   --    Liver Function Tests:  Recent Labs Lab 05/29/14 0957  AST 14  ALT 11  ALKPHOS 50  BILITOT 0.6  PROT 6.3  ALBUMIN 3.1*   No results found for this basename: LIPASE, AMYLASE,  in the last 168 hours No results found for this basename: AMMONIA,  in the last 168 hours CBC:  Recent Labs Lab 05/29/14 0957 05/30/14 0157 05/31/14 0443  WBC 7.3 7.7 6.4  NEUTROABS 6.0  --   --  HGB 12.3 12.1 12.0  HCT 35.6* 35.1* 35.0*  MCV 89.7 91.2 91.1  PLT 234 224 222   Cardiac Enzymes:  Recent Labs Lab 05/29/14 1429 05/30/14 0157 05/31/14 0443  CKTOTAL  --   --  60  TROPONINI <0.30 <0.30  --    BNP (last 3 results) No results found for this basename: PROBNP,  in the last 8760 hours CBG: No results found for this basename:  GLUCAP,  in the last 168 hours  Recent Results (from the past 240 hour(s))  SURGICAL PCR SCREEN     Status: None   Collection Time    05/25/14  7:42 AM      Result Value Ref Range Status   MRSA, PCR NEGATIVE  NEGATIVE Final   Staphylococcus aureus NEGATIVE  NEGATIVE Final   Comment:            The Xpert SA Assay (FDA     approved for NASAL specimens     in patients over 8 years of age),     is one component of     a comprehensive surveillance     program.  Test performance has     been validated by [employer] for patients greater     than or equal to 8 year old.     It is not intended     to diagnose infection nor to     guide or monitor treatment.  CULTURE, BLOOD (ROUTINE X 2)     Status: None   Collection Time    05/30/14 12:47 PM      Result Value Ref Range Status   Specimen Description BLOOD RIGHT ARM   Final   Special Requests BOTTLES DRAWN AEROBIC AND ANAEROBIC 10CC EACH   Final   Culture  Setup Time     Final   Value: 05/30/2014 16:39     Performed at [facility]   Culture     Final   Value:        BLOOD CULTURE RECEIVED NO GROWTH TO DATE CULTURE WILL BE HELD FOR 5 DAYS BEFORE ISSUING A FINAL NEGATIVE REPORT     Performed at [facility]   Report Status PENDING   Incomplete  CULTURE, BLOOD (ROUTINE X 2)     Status: None   Collection Time    05/30/14 12:49 PM      Result Value Ref Range Status   Specimen Description BLOOD RIGHT HAND   Final   Special Requests BOTTLES DRAWN AEROBIC AND ANAEROBIC [facility] EACH   Final   Culture  Setup Time     Final   Value: 05/30/2014 16:39     Performed at [facility]   Culture     Final   Value:        BLOOD CULTURE RECEIVED NO GROWTH TO DATE CULTURE WILL BE HELD FOR 5 DAYS BEFORE ISSUING A FINAL NEGATIVE REPORT     Performed at [facility]   Report Status PENDING   Incomplete     Studies: Ct Angio Chest Pe W/cm &/or Wo Cm  05/29/2014   CLINICAL DATA:  Weakness. Fatigue. Cough. Chills.  Back surgery 1 week ago. Elevated D-dimer.  EXAM: CT ANGIOGRAPHY CHEST WITH CONTRAST  TECHNIQUE: Multidetector CT imaging of the chest was performed using the standard protocol during bolus administration of intravenous contrast. Multiplanar CT image reconstructions and MIPs were obtained to evaluate the vascular anatomy.  CONTRAST:  166mL  OMNIPAQUE IOHEXOL 350 MG/ML SOLN  COMPARISON:  05/29/2014 radiographs  FINDINGS: No filling defect is identified in the pulmonary arterial tree to suggest pulmonary embolus. Atherosclerotic aortic arch noted. Small type 1 hiatal hernia. Hypodense segment 7 lesion, 0.4 cm, technically nonspecific, no change from recent CT.  No pathologic upper abdominal adenopathy is observed. There is evidence of old granulomatous disease involving the spleen.  Left apical scarring with punctate scratch have biapical scarring with punctate calcifications on the left. 0.5 by 0.3 x 0.3 cm subpleural pulmonary nodule, right middle lobe along the minor fissure, image 51 series 7. No significant bony abnormality is observed.  Review of the MIP images confirms the above findings.  IMPRESSION: 1. 4 mm right middle lobe nodule is subpleural in location, and accordingly statistically most likely to be a subpleural lymph node. If the patient is at high risk for bronchogenic carcinoma, follow-up chest CT at 1 year is recommended. If the patient is at low risk, no follow-up is needed. This recommendation follows the consensus statement: Guidelines for Management of Small Pulmonary Nodules Detected on CT Scans: A Statement from the [place] as published in Radiology 2005; 237:395-400. 2. Biapical pleural parenchymal scarring. 3. No embolus or acute thoracic findings identified. 4. Small hiatal hernia.   Electronically Signed   By: Sherryl Barters M.D.   On: 05/29/2014 17:54    Scheduled Meds: . cholecalciferol  1,000 Units Oral q morning - 10a  . feeding supplement (RESOURCE BREEZE)  1  Container Oral BID BM  . levothyroxine  75 mcg Oral QAC breakfast  . loratadine  10 mg Oral Daily  . metoCLOPramide (REGLAN) injection  5 mg Intravenous 4 times per day  . metoprolol tartrate  12.5 mg Oral BID  . omega-3 acid ethyl esters  1 g Oral q morning - 10a  . pantoprazole  40 mg Oral q morning - 10a  . prednisoLONE acetate  1 drop Right Eye BID  . simvastatin  20 mg Oral QHS  . sodium chloride  3 mL Intravenous Q12H   Continuous Infusions: . 0.9 % NaCl with KCl 20 mEq / L 75 mL/hr at 05/31/14 0431      Time spent: 25 minutes    Louellen Molder  [facility] Pager [phone] If 7PM-7AM, please contact night-coverage at [email or web address], password [facility] 05/31/2014, 3:48 PM  LOS: 2 days

## 2014-05-31 NOTE — Progress Notes (Signed)
Received report from LeeAnne, RN at 2300, agree with previous assessment. Will continue to monitor.  

## 2014-06-01 DIAGNOSIS — R110 Nausea: Secondary | ICD-10-CM

## 2014-06-01 LAB — BASIC METABOLIC PANEL
Anion gap: 9 (ref 5–15)
BUN: 4 mg/dL — ABNORMAL LOW (ref 6–23)
CO2: 26 mEq/L (ref 19–32)
Calcium: 7.9 mg/dL — ABNORMAL LOW (ref 8.4–10.5)
Chloride: 101 mEq/L (ref 96–112)
Creatinine, Ser: 0.74 mg/dL (ref 0.50–1.10)
GFR calc Af Amer: >90 mL/min (ref >90)
GFR calc non Af Amer: 80 mL/min — ABNORMAL LOW (ref >90)
Glucose, Bld: 120 mg/dL — ABNORMAL HIGH (ref 70–99)
Potassium: 3.9 mEq/L (ref 3.7–5.3)
Sodium: 136 mEq/L — ABNORMAL LOW (ref 137–147)

## 2014-06-01 MED ORDER — DICYCLOMINE HCL 10 MG PO CAPS: 10.0000 mg | ORAL_CAPSULE | Freq: Three times a day (TID) | ORAL | Status: DC

## 2014-06-01 MED ORDER — METOCLOPRAMIDE HCL 5 MG PO TABS: 5.0000 mg | ORAL_TABLET | Freq: Four times a day (QID) | ORAL | Status: DC | PRN

## 2014-06-01 MED ORDER — BOOST / RESOURCE BREEZE PO LIQD: 1.0000 | Freq: Two times a day (BID) | ORAL | Status: DC

## 2014-06-01 MED ORDER — LEVOTHYROXINE SODIUM 50 MCG PO TABS: 50.0000 ug | ORAL_TABLET | Freq: Every day | ORAL | Status: DC

## 2014-06-01 MED ORDER — DICYCLOMINE HCL 10 MG PO CAPS
10.0000 mg | ORAL_CAPSULE | Freq: Three times a day (TID) | ORAL | Status: DC
  Filled 2014-06-01 (×4): qty 1

## 2014-06-01 NOTE — Discharge Summary (Signed)
Physician Discharge Summary  Tammy Allison GMW:[id number] DOB: January 14, 1935 DOA: 05/29/2014  PCP: [name], MD  Admit date: 05/29/2014 Discharge date: 06/01/2014  Time spent: 30 minutes  Recommendations for Outpatient Follow-up:  1. D/C home with outpatient PCP followup in one week. Recommend referral to GI ( has seen Dr Penelope Coop in past) if symptoms persist 2. Patient has followup appointment with neurosurgery  Discharge Diagnoses:  Principal Problem:   Hypokalemia  Active Problems:   Weakness   Electrolyte abnormality   Tachycardia   Malnutrition of moderate degree   Nausea alone   Discharge Condition: Fair  Diet recommendation: Regular  Filed Weights   05/29/14 1450  Weight: 59.648 kg (131 lb 8 oz)    History of present illness:  Please refer to admission H&P for details, but in brief,79 year old female with history of hypertension, hyperlipidemia, GERD, hypothyroidism, recent left L3 hemilaminectomy on 7/9 presented with generalized weakness with poor appetite, nausea subjective chills and nonproductive cough. In the ED patient was found to be tachycardic with hypokalemia and admitted to hospitalist service.   Hospital Course:  Generalized weakness with poor appetite and nausea  Possibly contributed by poor by mouth intake, dehydration and electrolyte abnormalities. Recent CT scan of the abdomen was unremarkable. Patient has a fentanyl patch for her back pain. Patient continued to have nausea with dry heaving but no vomiting.  -Symptoms better after placing her on scheduled Reglan.  -Seen by nutrition consult and ordered resource breeze twice a day. Seen by physical therapy and recommended stable for discharge home . Tolerating low sodium diet.  -I will  discharge her on when necessary Reglan .   Hypokalemia  - secondary to poor by mouth intake. Replacing with by mouth and IV fluids.  .  Elevated blood pressure and tachycardia Possibly secondary to  dehydration. Currently stable on telemetry. Noted for elevated d-dimer on admission but CT angiogram of the chest was negative for PE. TSH slightly low but with normal free T4. plased on low-dose metoprolol. Now stable.  Hypothyroidism  Noted for low TSH but a normal free T4. I will reduce her Synthroid dose to 50 mcg (was on the 75 mcg at home)    GERD  Continue PPI   Diarrhea with IBS Patient reported having 2 episodes bowel movement this morning. Has not been on any antibiotics recently. She has had some diarrhea a few weeks back when she was admitted to the hospital. He was ruled out for C. difficile at that time. Patient reports PediOtic constipation with diarrhea which is chronic. She has still irritable bowel syndrome. I will discharge her on dicyclomine 10 mg tid and HS for next 10 days. Can continue lomotil at home as needed.   Recent left L3 hemilaminectomy  Normal exam and wound looks clean. Continue home pain medications upon discharge Followup with neurosurgery as outpatient   Diet: Low-sodium   Code Status: Full code   Family Communication: None at bedside   Disposition: Home   Consultants:  None Procedures:  None Antibiotics:  None     Discharge Exam: Filed Vitals:   06/01/14 0550  BP: 155/73  Pulse: 92  Temp: 98 F (36.7 C)  Resp: 18    General: Elderly female lying in no acute distress HEENT: No pallor, moist mucosa  Chest: Clear to auscultation bilaterally, no added sounds  CVS: Normal S1 and S2, no murmurs   Abdomen: Soft, nontender, nondistended, bowel sounds present  Extremities: Warm, no edema, lumbar surgical area clean  CNS: Alert and oriented   Discharge Instructions You were cared for by a hospitalist during your hospital stay. If you have any questions about your discharge medications or the care you received while you were in the hospital after you are discharged, you can call the unit and asked to speak with the hospitalist on call if  the hospitalist that took care of you is not available. Once you are discharged, your primary care physician will handle any further medical issues. Please note that NO REFILLS for any discharge medications will be authorized once you are discharged, as it is imperative that you return to your primary care physician (or establish a relationship with a primary care physician if you do not have one) for your aftercare needs so that they can reassess your need for medications and monitor your lab values.     Medication List         cetirizine 10 MG tablet  Commonly known as:  ZYRTEC  Take 10 mg by mouth every morning.     cholecalciferol 1000 UNITS tablet  Commonly known as:  VITAMIN D  Take 1,000 Units by mouth every morning.     dicyclomine 10 MG capsule  Commonly known as:  BENTYL  Take 1 capsule (10 mg total) by mouth 4 (four) times daily -  before meals and at bedtime.     docusate sodium 100 MG capsule  Commonly known as:  COLACE  Take 100 mg by mouth 2 (two) times daily as needed for mild constipation.     feeding supplement (RESOURCE BREEZE) Liqd  Take 1 Container by mouth 2 (two) times daily between meals.     fentaNYL 25 MCG/HR patch  Commonly known as:  DURAGESIC - dosed mcg/hr  Place 25 mcg onto the skin every 3 (three) days.     HYDROcodone-acetaminophen 5-325 MG per tablet  Commonly known as:  NORCO/VICODIN  Take 1 tablet by mouth every 6 (six) hours as needed for moderate pain.     levothyroxine 50 MCG tablet  Commonly known as:  SYNTHROID, LEVOTHROID  Take 50 mcg by mouth daily before breakfast.     metoCLOPramide 5 MG tablet  Commonly known as:  REGLAN  Take 1 tablet (5 mg total) by mouth every 6 (six) hours as needed for nausea.     omega-3 acid ethyl esters 1 G capsule  Commonly known as:  LOVAZA  Take 1 g by mouth every morning.     pantoprazole 40 MG tablet  Commonly known as:  PROTONIX  Take 40 mg by mouth every morning.     prednisoLONE acetate 1  % ophthalmic suspension  Commonly known as:  PRED FORTE  Place 1 drop into the right eye 2 (two) times daily.     simvastatin 20 MG tablet  Commonly known as:  ZOCOR  Take 20 mg by mouth at bedtime.       Allergies  Allergen Reactions  . Sulfa Antibiotics Hives       Follow-up Information   Follow up with [name], MD. Schedule an appointment as soon as possible for a visit in 1 week.   Specialty:  Family Medicine   Contact information:   [place] [address] [phone]        The results of significant diagnostics from this hospitalization (including imaging, microbiology, ancillary and laboratory) are listed below for reference.    Significant Diagnostic Studies: Dg Chest 2 View  05/25/2014  CLINICAL DATA:  Preoperative films.  EXAM: CHEST  2 VIEW  COMPARISON:  PA and lateral chest 05/25/2013.  FINDINGS: Lungs are clear. Heart size is normal. No pneumothorax or pleural effusion.  IMPRESSION: No acute disease.   Electronically Signed   By: Inge Rise M.D.   On: 05/25/2014 07:59   Dg Lumbar Spine 2-3 Views  05/25/2014   CLINICAL DATA:  Planned lumbar decompression  EXAM: LUMBAR SPINE - 2-3 VIEW  COMPARISON:  Lumbar spine series of May 23, 2014  FINDINGS: On image#1 the metallic needle lies over the posterior aspect of the L3 transverse process. This is 5.2 cm posterior to the posterior margin of the L3-4 disc level. The film labeled #2 reveals a larger localization device lying just posterior to the L2-3 facet joint and approximately 1 cm above the L3 spinous process.  IMPRESSION: The metallic marker devices are positioned as described.   Electronically Signed   By: David  Martinique   On: 05/25/2014 14:09   Ct Angio Chest Pe W/cm &/or Wo Cm  05/29/2014   CLINICAL DATA:  Weakness. Fatigue. Cough. Chills. Back surgery 1 week ago. Elevated D-dimer.  EXAM: CT ANGIOGRAPHY CHEST WITH CONTRAST  TECHNIQUE: Multidetector CT imaging of the chest was performed  using the standard protocol during bolus administration of intravenous contrast. Multiplanar CT image reconstructions and MIPs were obtained to evaluate the vascular anatomy.  CONTRAST:  180mL OMNIPAQUE IOHEXOL 350 MG/ML SOLN  COMPARISON:  05/29/2014 radiographs  FINDINGS: No filling defect is identified in the pulmonary arterial tree to suggest pulmonary embolus. Atherosclerotic aortic arch noted. Small type 1 hiatal hernia. Hypodense segment 7 lesion, 0.4 cm, technically nonspecific, no change from recent CT.  No pathologic upper abdominal adenopathy is observed. There is evidence of old granulomatous disease involving the spleen.  Left apical scarring with punctate scratch have biapical scarring with punctate calcifications on the left. 0.5 by 0.3 x 0.3 cm subpleural pulmonary nodule, right middle lobe along the minor fissure, image 51 series 7. No significant bony abnormality is observed.  Review of the MIP images confirms the above findings.  IMPRESSION: 1. 4 mm right middle lobe nodule is subpleural in location, and accordingly statistically most likely to be a subpleural lymph node. If the patient is at high risk for bronchogenic carcinoma, follow-up chest CT at 1 year is recommended. If the patient is at low risk, no follow-up is needed. This recommendation follows the consensus statement: Guidelines for Management of Small Pulmonary Nodules Detected on CT Scans: A Statement from the [employer] as published in Radiology 2005; 237:395-400. 2. Biapical pleural parenchymal scarring. 3. No embolus or acute thoracic findings identified. 4. Small hiatal hernia.   Electronically Signed   By: Sherryl Barters M.D.   On: 05/29/2014 17:54   Ct Abdomen Pelvis W Contrast  05/12/2014   CLINICAL DATA:  Left-sided back pain and nausea.  Microhematuria.  BUN and creatinine were obtained on site at [facility] at [address]Results: BUN 9 mg/dL, Creatinine 0.8 mg/dL.  EXAM: CT ABDOMEN AND PELVIS  WITH CONTRAST  TECHNIQUE: Multidetector CT imaging of the abdomen and pelvis was performed using the standard protocol following bolus administration of intravenous contrast.  CONTRAST:  164mL OMNIPAQUE IOHEXOL 300 MG/ML  SOLN  COMPARISON:  CT scan dated 09/14/2006  FINDINGS: The patient has a large extruded disc fragment behind the body of L3 extending into the left lateral recess and left neural foramen. This should compress the left L3 nerve. It  could have originated from the L2-3 or L3-4 disc space. This was not present on the prior exam of 10/28/2011 and may be the cause of the patient's left-sided back pain.  The liver, spleen, pancreas, adrenal glands, and kidneys demonstrate no significant abnormalities. There are 2 small benign cysts in the liver, 1 in the lateral segment of the left lobe adjacent to the gallbladder, 8 mm, and 1 in the posterior aspect of the dome of the right lobe of the liver, 5 mm.  Biliary tree is normal. There are numerous diverticula in the distal colon but there is no diverticulitis. Terminal ileum and appendix appear normal. Extensive calcification in the abdominal aorta and proximal renal arteries. Uterus and ovaries are normal. The bladder is empty. No acute osseous abnormalities.  IMPRESSION: 1. Large extruded soft disc fragment lying behind the left side of L3 compressing the left L3 nerve in the thecal sac. This could have originated from L2-3 or L3-4. 2. Extensive diverticulosis of the sigmoid portion of the colon without diverticulitis.   Electronically Signed   By: Rozetta Nunnery M.D.   On: 05/12/2014 17:18   Dg Chest Portable 1 View  05/29/2014   CLINICAL DATA:  Cough.  Nonsmoker.  EXAM: PORTABLE CHEST - 1 VIEW  COMPARISON:  05/25/2014  FINDINGS: Similar appearance an apical pleural thickening without associated bony destruction.  No infiltrate, congestive heart failure or pneumothorax. No plain film evidence of pulmonary malignancy. If there is a persistent unexplained  cough followup imaging may be considered.  Heart size within normal limits.  Calcified aorta.  IMPRESSION: No acute abnormality noted.  Please see above   Electronically Signed   By: Chauncey Cruel M.D.   On: 05/29/2014 09:48    Microbiology: Recent Results (from the past 240 hour(s))  SURGICAL PCR SCREEN     Status: None   Collection Time    05/25/14  7:42 AM      Result Value Ref Range Status   MRSA, PCR NEGATIVE  NEGATIVE Final   Staphylococcus aureus NEGATIVE  NEGATIVE Final   Comment:            The Xpert SA Assay (FDA     approved for NASAL specimens     in patients over 63 years of age),     is one component of     a comprehensive surveillance     program.  Test performance has     been validated by [employer] for patients greater     than or equal to 74 year old.     It is not intended     to diagnose infection nor to     guide or monitor treatment.  CULTURE, BLOOD (ROUTINE X 2)     Status: None   Collection Time    05/30/14 12:47 PM      Result Value Ref Range Status   Specimen Description BLOOD RIGHT ARM   Final   Special Requests BOTTLES DRAWN AEROBIC AND ANAEROBIC 10CC EACH   Final   Culture  Setup Time     Final   Value: 05/30/2014 16:39     Performed at [facility]   Culture     Final   Value:        BLOOD CULTURE RECEIVED NO GROWTH TO DATE CULTURE WILL BE HELD FOR 5 DAYS BEFORE ISSUING A FINAL NEGATIVE REPORT     Performed at [facility]   Report Status  PENDING   Incomplete  CULTURE, BLOOD (ROUTINE X 2)     Status: None   Collection Time    05/30/14 12:49 PM      Result Value Ref Range Status   Specimen Description BLOOD RIGHT HAND   Final   Special Requests BOTTLES DRAWN AEROBIC AND ANAEROBIC [facility] EACH   Final   Culture  Setup Time     Final   Value: 05/30/2014 16:39     Performed at [facility]   Culture     Final   Value:        BLOOD CULTURE RECEIVED NO GROWTH TO DATE CULTURE WILL BE HELD FOR 5 DAYS BEFORE ISSUING A FINAL  NEGATIVE REPORT     Performed at [facility]   Report Status PENDING   Incomplete  URINE CULTURE     Status: None   Collection Time    05/30/14  8:08 PM      Result Value Ref Range Status   Specimen Description URINE, CLEAN CATCH   Final   Special Requests NONE   Final   Culture  Setup Time     Final   Value: 05/31/2014 00:34     Performed at [place] PENDING   Incomplete   Culture     Final   Value: Culture reincubated for better growth     Performed at [facility]   Report Status PENDING   Incomplete     Labs: Basic Metabolic Panel:  Recent Labs Lab 05/29/14 0957 05/30/14 0157 05/31/14 0443 06/01/14 0459  NA 134* 136* 135* 136*  K 3.2* 3.4* 3.3* 3.9  CL 95* 100 98 101  CO2 27 26 24 26   GLUCOSE 135* 121* 127* 120*  BUN 6 4* 5* 4*  CREATININE 0.60 0.64 0.65 0.74  CALCIUM 8.9 8.1* 7.9* 7.9*  MG 1.8  --   --   --    Liver Function Tests:  Recent Labs Lab 05/29/14 0957  AST 14  ALT 11  ALKPHOS 50  BILITOT 0.6  PROT 6.3  ALBUMIN 3.1*   No results found for this basename: LIPASE, AMYLASE,  in the last 168 hours No results found for this basename: AMMONIA,  in the last 168 hours CBC:  Recent Labs Lab 05/29/14 0957 05/30/14 0157 05/31/14 0443  WBC 7.3 7.7 6.4  NEUTROABS 6.0  --   --   HGB 12.3 12.1 12.0  HCT 35.6* 35.1* 35.0*  MCV 89.7 91.2 91.1  PLT 234 224 222   Cardiac Enzymes:  Recent Labs Lab 05/29/14 1429 05/30/14 0157 05/31/14 0443  CKTOTAL  --   --  60  TROPONINI <0.30 <0.30  --    BNP: BNP (last 3 results) No results found for this basename: PROBNP,  in the last 8760 hours CBG: No results found for this basename: GLUCAP,  in the last 168 hours     Signed:  Divina Neale, [place]  [facility] 06/01/2014, 10:37 AM

## 2014-06-01 NOTE — Progress Notes (Signed)
Physical Therapy Treatment Patient Details Name: IANNA SALMELA MRN: [id number] DOB: 11-08-1935 Today's Date: 06/01/2014    History of Present Illness 79 yo female admitted with hypokalemia, tachycardia, weakness. Hx of L3 hemilaminectomy, microdiscectomy 05/25/14, HTN,hernia.     PT Comments    Pt continuing to mobilize well. Plans to d/c home later today.   Follow Up Recommendations  Supervision - Intermittent     Equipment Recommendations  None recommended by PT    Recommendations for Other Services       Precautions / Restrictions Precautions Precautions: Back Precaution Comments: pt able to recall all back precautions and logroll technique.  Required Braces or Orthoses: Spinal Brace Spinal Brace: Lumbar corset;Applied in standing position Restrictions Weight Bearing Restrictions: No    Mobility  Bed Mobility Overal bed mobility: Modified Independent                Transfers Overall transfer level: Modified independent                  Ambulation/Gait Ambulation/Gait assistance: Supervision Ambulation Distance (Feet): 500 Feet Assistive device: None Gait Pattern/deviations: Decreased stride length     General Gait Details: slow gait speed. close guard for safety   Stairs            Wheelchair Mobility    Modified Rankin (Stroke Patients Only)       Balance                                    Cognition Arousal/Alertness: Awake/alert Behavior During Therapy: WFL for tasks assessed/performed Overall Cognitive Status: Within Functional Limits for tasks assessed                      Exercises      General Comments        Pertinent Vitals/Pain Pt denies pain    Home Living                      Prior Function            PT Goals (current goals can now be found in the care plan section) Progress towards PT goals: Progressing toward goals    Frequency  Min 3X/week    PT Plan       Co-evaluation             End of Session   Activity Tolerance: Patient tolerated treatment well Patient left: in bed;with call bell/phone within reach     Time: 5284-1324 PT Time Calculation (min): 10 min  Charges:  $Gait Training: 8-22 mins                    G Codes:      Weston Anna, MPT Pager: [phone]

## 2014-06-01 NOTE — Discharge Instructions (Signed)

## 2014-06-03 LAB — URINE CULTURE: Colony Count: >=100000

## 2014-06-05 LAB — CULTURE, BLOOD (ROUTINE X 2)
Culture: NO GROWTH
Culture: NO GROWTH

## 2014-06-08 DIAGNOSIS — R197 Diarrhea, unspecified: Secondary | ICD-10-CM | POA: Diagnosis not present

## 2014-06-08 DIAGNOSIS — E871 Hypo-osmolality and hyponatremia: Secondary | ICD-10-CM | POA: Diagnosis not present

## 2014-06-24 ENCOUNTER — Encounter: Payer: Self-pay | Admitting: *Deleted

## 2014-06-29 DIAGNOSIS — K589 Irritable bowel syndrome without diarrhea: Secondary | ICD-10-CM | POA: Diagnosis not present

## 2014-07-07 DIAGNOSIS — R197 Diarrhea, unspecified: Secondary | ICD-10-CM | POA: Diagnosis not present

## 2014-07-18 DIAGNOSIS — R5381 Other malaise: Secondary | ICD-10-CM | POA: Diagnosis not present

## 2014-07-18 DIAGNOSIS — R5383 Other fatigue: Secondary | ICD-10-CM | POA: Diagnosis not present

## 2014-09-01 DIAGNOSIS — K580 Irritable bowel syndrome with diarrhea: Secondary | ICD-10-CM | POA: Diagnosis not present

## 2014-09-01 DIAGNOSIS — K219 Gastro-esophageal reflux disease without esophagitis: Secondary | ICD-10-CM | POA: Diagnosis not present

## 2014-09-01 DIAGNOSIS — R109 Unspecified abdominal pain: Secondary | ICD-10-CM | POA: Diagnosis not present

## 2014-09-01 DIAGNOSIS — E039 Hypothyroidism, unspecified: Secondary | ICD-10-CM | POA: Diagnosis not present

## 2014-10-18 DIAGNOSIS — H40033 Anatomical narrow angle, bilateral: Secondary | ICD-10-CM | POA: Diagnosis not present

## 2014-11-22 DIAGNOSIS — E039 Hypothyroidism, unspecified: Secondary | ICD-10-CM | POA: Diagnosis not present

## 2014-11-22 DIAGNOSIS — K219 Gastro-esophageal reflux disease without esophagitis: Secondary | ICD-10-CM | POA: Diagnosis not present

## 2014-11-22 DIAGNOSIS — E876 Hypokalemia: Secondary | ICD-10-CM | POA: Diagnosis not present

## 2014-11-22 DIAGNOSIS — K580 Irritable bowel syndrome with diarrhea: Secondary | ICD-10-CM | POA: Diagnosis not present

## 2014-11-22 DIAGNOSIS — E780 Pure hypercholesterolemia: Secondary | ICD-10-CM | POA: Diagnosis not present

## 2014-11-22 DIAGNOSIS — M859 Disorder of bone density and structure, unspecified: Secondary | ICD-10-CM | POA: Diagnosis not present

## 2014-12-27 DIAGNOSIS — I10 Essential (primary) hypertension: Secondary | ICD-10-CM | POA: Diagnosis not present

## 2014-12-27 DIAGNOSIS — M25519 Pain in unspecified shoulder: Secondary | ICD-10-CM | POA: Diagnosis not present

## 2015-01-10 DIAGNOSIS — Z1231 Encounter for screening mammogram for malignant neoplasm of breast: Secondary | ICD-10-CM | POA: Diagnosis not present

## 2015-01-18 ENCOUNTER — Ambulatory Visit: Payer: Medicare Other | Attending: Family Medicine | Admitting: Physical Therapy

## 2015-01-18 ENCOUNTER — Encounter: Payer: Self-pay | Admitting: Physical Therapy

## 2015-01-18 DIAGNOSIS — R29898 Other symptoms and signs involving the musculoskeletal system: Secondary | ICD-10-CM | POA: Diagnosis not present

## 2015-01-18 DIAGNOSIS — M25619 Stiffness of unspecified shoulder, not elsewhere classified: Secondary | ICD-10-CM

## 2015-01-18 DIAGNOSIS — M25511 Pain in right shoulder: Secondary | ICD-10-CM | POA: Insufficient documentation

## 2015-01-18 DIAGNOSIS — K219 Gastro-esophageal reflux disease without esophagitis: Secondary | ICD-10-CM | POA: Diagnosis not present

## 2015-01-18 DIAGNOSIS — M1990 Unspecified osteoarthritis, unspecified site: Secondary | ICD-10-CM | POA: Diagnosis not present

## 2015-01-18 DIAGNOSIS — I10 Essential (primary) hypertension: Secondary | ICD-10-CM | POA: Diagnosis not present

## 2015-01-18 DIAGNOSIS — E785 Hyperlipidemia, unspecified: Secondary | ICD-10-CM | POA: Diagnosis not present

## 2015-01-18 DIAGNOSIS — E039 Hypothyroidism, unspecified: Secondary | ICD-10-CM | POA: Diagnosis not present

## 2015-01-18 DIAGNOSIS — M25512 Pain in left shoulder: Secondary | ICD-10-CM | POA: Insufficient documentation

## 2015-01-18 NOTE — Therapy (Signed)
[facility] [address], [place], [place], [address] Phone: [phone]   Fax:  [phone]  Physical Therapy Evaluation  Patient Details  Name: Tammy Allison MRN: [id number] Date of Birth: 1935/09/23 Referring Provider:  [name], MD  Encounter Date: 01/18/2015      PT End of Session - 01/18/15 1101    Visit Number 1   Number of Visits 10  Medicare   Date for PT Re-Evaluation 03/01/15   PT Start Time 1194   PT Stop Time 1100   PT Time Calculation (min) 45 min   Activity Tolerance Patient tolerated treatment well   Behavior During Therapy [facility] for tasks assessed/performed      Past Medical History  Diagnosis Date  . Hypertension   . Hyperlipidemia   . Hypothyroidism   . GERD (gastroesophageal reflux disease)   . H/O hiatal hernia   . Arthritis   . Irritable bowel syndrome     Past Surgical History  Procedure Laterality Date  . Thyroid surgery    . Fracture surgery      right hip  . Eye surgery Bilateral     cataract surgery w/ lens implant  . Lumbar laminectomy/decompression microdiscectomy Left 05/25/2014    Procedure: LUMBAR LAMINECTOMY/DECOMPRESSION MICRODISCECTOMY 1 LEVEL   lumbar three;  Surgeon: Hosie Spangle, MD;  Location: [place] [facility];  Service: Neurosurgery;  Laterality: Left;    There were no vitals taken for this visit.  Visit Diagnosis:  Decreased range of motion (ROM) of shoulder - Plan: PT plan of care cert/re-cert  Shoulder weakness - Plan: PT plan of care cert/re-cert      Subjective Assessment - 01/18/15 1024    Symptoms Patient reports pain in bilateral shoulder with movement.  Pain came on suddenly.  Patient feels it may of been when she had back surgery and had to use her arms to get out of bed. difficulty putting jacket on .   Pertinent History Lamenectomy 05/25/2014   Limitations Lifting;House hold activities   Patient Stated Goals Reduce pain and return to prior  function   Currently in Pain? Yes   Pain Score 3    Pain Location Shoulder   Pain Orientation Right;Left   Pain Descriptors / Indicators Sharp;Sore   Pain Type Chronic pain   Pain Onset More than a month ago   Pain Frequency Intermittent   Aggravating Factors  using her shoulders more   Pain Relieving Factors keep arms at rest   Multiple Pain Sites No          [facility] PT Assessment - 01/18/15 0001    Assessment   Medical Diagnosis Shoulder pain   Onset Date 05/17/14   Next MD Visit No appointment   Prior Therapy years ago due to frozen shoulders   Precautions   Precautions None   Balance Screen   Has the patient fallen in the past 6 months No   Has the patient had a decrease in activity level because of a fear of falling?  No   Is the patient reluctant to leave their home because of a fear of falling?  No   Prior Function   Level of Independence Independent with basic ADLs   Observation/Other Assessments   Focus on Therapeutic Outcomes (FOTO)  55% status   AROM   Right Shoulder Flexion 130 Degrees   Right Shoulder ABduction 110 Degrees   Right Shoulder Internal Rotation 30 Degrees   Right Shoulder External  Rotation 60 Degrees   Left Shoulder Flexion 130 Degrees   Left Shoulder ABduction 118 Degrees   Left Shoulder Internal Rotation 45 Degrees   Left Shoulder External Rotation 40 Degrees   PROM   Right Shoulder Flexion 170 Degrees  supine, pain   Right Shoulder ABduction 180 Degrees  supine, pain   Right Shoulder Internal Rotation 65 Degrees  supine   Right Shoulder External Rotation 90 Degrees  supine, pain   Left Shoulder Flexion 163 Degrees  supine, pain   Left Shoulder ABduction 180 Degrees   Left Shoulder Internal Rotation 50 Degrees  supine   Left Shoulder External Rotation 90 Degrees  supine   Strength   Right Shoulder Flexion 3+/5  pain   Right Shoulder ABduction 3+/5  pain   Right Shoulder Internal Rotation 4+/5   Right Shoulder External Rotation  4/5  pain   Left Shoulder Flexion 3+/5  pain   Left Shoulder ABduction 4-/5  pain   Left Shoulder Internal Rotation --  4+/5   Left Shoulder External Rotation 4/5  pain   Palpation   Palpation Palpable tenderness in bilateral rotator cuff insertion   Bed Mobility   Bed Mobility --  difficulty getting out of bed due to pain                          PT Education - 01/18/15 1100    Education provided Yes   Education Details cane exercises, pendulums   Person(s) Educated Patient   Methods Explanation;Demonstration;Verbal cues   Comprehension Verbalized understanding;Returned demonstration          PT Short Term Goals - 01/18/15 1110    PT SHORT TERM GOAL #1   Title pain in bilateral shoulder with daily activities decreased >/= 25%   Time 3   Period Weeks   Status New   PT SHORT TERM GOAL #2   Title pain in bilateral shoulder with getting out of bed decreased >/= 25%   Time 3   Period Weeks   Status New   PT SHORT TERM GOAL #3   Title pain with pushing out of a chair decreased >/= 25%   Time 3   Period Weeks   Status New           PT Long Term Goals - 01/18/15 1111    PT LONG TERM GOAL #1   Title pain with daily activities decreased >/= 75%   Time 6   Period Weeks   Status New   PT LONG TERM GOAL #2   Title pain with getting out of bed decreased >/= 75%   Time 6   Period Weeks   Status New   PT LONG TERM GOAL #3   Title pain with pushing out of a chair decreased >/= 75%   Time 6   Period Weeks   Status New   PT LONG TERM GOAL #4   Title Bilateral shoulder strength >/= 4+/5   Time 6   Period Weeks   Status New               Plan - 01/18/15 1108    Clinical Impression Statement Patient has limited range of motion in bilateral shoulders and decreased strength.  Palpable tenderness in bilateral rotator cuff insertion.    Pt will benefit from skilled therapeutic intervention in order to improve on the following deficits  Decreased range of motion;Impaired flexibility;Pain;Impaired UE functional use;Increased muscle spasms;Decreased mobility;Decreased  strength   Rehab Potential Good   Clinical Impairments Affecting Rehab Potential None   PT Frequency 2x / week   PT Duration 6 weeks   PT Treatment/Interventions Moist Heat;Therapeutic activities;Patient/family education;Passive range of motion;Therapeutic exercise;Ultrasound;Manual techniques;Neuromuscular re-education;Other (comment)  Iontophoresis with dexamethasone    PT Next Visit Plan Ultrasound bilateral shoulders, overhead pulleys, shoulder isometrics   PT Home Exercise Plan shoulder isometrics   Consulted and Agree with Plan of Care Patient          G-Codes - 17-Feb-2015 1014    Functional Assessment Tool Used FOTO 55% status   Functional Limitation Other PT primary   Other PT Primary Current Status (F2761) At least 40 percent but less than 60 percent impaired, limited or restricted   Other PT Primary Goal Status (Y7092) At least 20 percent but less than 40 percent impaired, limited or restricted       Problem List Patient Active Problem List   Diagnosis Date Noted  . Nausea alone 05/31/2014  . Malnutrition of moderate degree 05/30/2014  . Weakness 05/29/2014  . Tachycardia 05/29/2014  . HNP (herniated nucleus pulposus), lumbar 05/25/2014  . Electrolyte abnormality 05/12/2014  . Hypokalemia 05/12/2014  . Hyponatremia 05/12/2014  . Lumbosacral radiculopathy 05/12/2014  . Hematuria 05/12/2014  . Benign essential hypertension 05/12/2014  . Hyperlipidemia 05/12/2014  . Hypothyroid 05/12/2014    GRAY,CHERYL, PT 02-17-15, 12:07 PM  [facility] [address] [place], [place], [address] Phone: [phone]   Fax:  [phone]

## 2015-01-18 NOTE — Patient Instructions (Addendum)
Cane Exercise: Flexion   Lie on back, holding cane above chest. Keeping arms as straight as possible, lower cane toward floor beyond head. Hold 1____ seconds. Repeat _10___ times. Do _1-2___ sessions per day.  http://gt2.exer.us/92   Copyright  VHI. All rights reserved.  Cane Horizontal - Supine   With straight arms holding cane above shoulders, bring cane out to right, center, out to left, and back to above head. Repeat __10_ times. Do _1-2__ times per day.  Copyright  VHI. All rights reserved.   ROM: Pendulum (Circular)   Let right arm move in circle clockwise, then counterclockwise, by rocking body weight in circular pattern. Circle __10__ times each direction per set. Do __1__ sets per session. Do __2__ sessions per day. Do on both shoulders  http://orth.exer.us/794   Copyright  VHI. All rights reserved.  ROM: Pendulum (Side-to-Side)   Let right arm swing freely from side to side by rocking body weight from side to side. Repeat _10___ times per set. Do __1__ sets per session. Do ___2_ sessions per day. Do on both shoulders  http://orth.exer.us/792   Copyright  VHI. All rights reserved.  Patient able to return demonstration correctly.

## 2015-01-19 ENCOUNTER — Ambulatory Visit: Payer: Medicare Other | Admitting: Physical Therapy

## 2015-01-22 ENCOUNTER — Ambulatory Visit: Payer: Medicare Other

## 2015-01-22 DIAGNOSIS — E785 Hyperlipidemia, unspecified: Secondary | ICD-10-CM | POA: Diagnosis not present

## 2015-01-22 DIAGNOSIS — R29898 Other symptoms and signs involving the musculoskeletal system: Secondary | ICD-10-CM

## 2015-01-22 DIAGNOSIS — I10 Essential (primary) hypertension: Secondary | ICD-10-CM | POA: Diagnosis not present

## 2015-01-22 DIAGNOSIS — E039 Hypothyroidism, unspecified: Secondary | ICD-10-CM | POA: Diagnosis not present

## 2015-01-22 DIAGNOSIS — M25619 Stiffness of unspecified shoulder, not elsewhere classified: Secondary | ICD-10-CM

## 2015-01-22 DIAGNOSIS — M25511 Pain in right shoulder: Secondary | ICD-10-CM | POA: Diagnosis not present

## 2015-01-22 DIAGNOSIS — M25512 Pain in left shoulder: Secondary | ICD-10-CM | POA: Diagnosis not present

## 2015-01-22 NOTE — Patient Instructions (Signed)
Strengthening: Resisted Internal Rotation   Hold tubing in left hand, elbow at side and forearm out. Rotate forearm in across body. Repeat _10___ times per set. Do 1-2___ sets per session. Do _1___ sessions per day.  http://orth.exer.us/830   Copyright  VHI. All rights reserved.  Strengthening: Resisted External Rotation   Hold tubing in right hand, elbow at side and forearm across body. Rotate forearm out. Repeat __10__ times per set. Do __1-2__ sets per session. Do _1___ sessions per day.  http://orth.exer.us/828   Copyright  VHI. All rights reserved.  Strengthening: Resisted Flexion   Hold tubing with left arm at side. Pull forward and up. Move shoulder through pain-free range of motion. Repeat _10___ times per set. Do _2___ sets per session. Do _1-2___ sessions per day.  http://orth.exer.us/824   Copyright  VHI. All rights reserved.  Strengthening: Resisted Extension   Hold tubing in right hand, arm forward. Pull arm back, elbow straight. Repeat _10___ times per set. Do 1-2___ sets per session. Do _1___ sessions per day.  http://orth.exer.us/832   Copyright  VHI. All rights reserved.

## 2015-01-22 NOTE — Therapy (Signed)
[facility] [address], [place], [place], [address] Phone: [phone]   Fax:  [phone]  Physical Therapy Treatment  Patient Details  Name: Tammy Allison MRN: [id number] Date of Birth: 1935-09-24 Referring Provider:  [name], MD  Encounter Date: 01/22/2015      PT End of Session - 01/22/15 1149    Visit Number 2   Number of Visits 10  Medicare   Date for PT Re-Evaluation 03/01/15   PT Start Time 1100   PT Stop Time 1157   PT Time Calculation (min) 48 min   Activity Tolerance Patient tolerated treatment well   Behavior During Therapy [facility] for tasks assessed/performed      Past Medical History  Diagnosis Date  . Hypertension   . Hyperlipidemia   . Hypothyroidism   . GERD (gastroesophageal reflux disease)   . H/O hiatal hernia   . Arthritis   . Irritable bowel syndrome     Past Surgical History  Procedure Laterality Date  . Thyroid surgery    . Fracture surgery      right hip  . Eye surgery Bilateral     cataract surgery w/ lens implant  . Lumbar laminectomy/decompression microdiscectomy Left 05/25/2014    Procedure: LUMBAR LAMINECTOMY/DECOMPRESSION MICRODISCECTOMY 1 LEVEL   lumbar three;  Surgeon: Hosie Spangle, MD;  Location: [place] [facility];  Service: Neurosurgery;  Laterality: Left;    There were no vitals taken for this visit.  Visit Diagnosis:  Decreased range of motion (ROM) of shoulder  Shoulder weakness      Subjective Assessment - 01/22/15 1100    Symptoms Pt reports that she has been doing HEP and reports that things still feel sore.   Currently in Pain? Yes   Pain Score 3   0-3/10   Pain Location Shoulder   Pain Orientation Right;Left   Pain Descriptors / Indicators Sharp;Sore   Pain Type Chronic pain   Pain Onset More than a month ago   Pain Frequency Intermittent   Aggravating Factors  moving the arms and night time   Pain Relieving Factors not moving the arms   Multiple Pain Sites No                    [facility] Adult PT Treatment/Exercise - 01/22/15 0001    Exercises   Exercises Shoulder   Shoulder Exercises: Pulleys   Flexion 3 minutes   ABduction 2 minutes   Shoulder Exercises: ROM/Strengthening   UBE (Upper Arm Bike) Level 1x 6 minutes (3/3)   Other ROM/Strengthening Exercises [place] 4 ways with yellow theraband. Straight elbow with flexion and extension 2x10 bil. each  added to HEP   Shoulder Exercises: Stretch   Other Shoulder Stretches supine cane exercises: flexion and horizontal abduction x 10 each  good return demo of HEP   Modalities   Modalities Ultrasound   Ultrasound   Ultrasound Location Lt and Rt shoulder   Ultrasound Parameters 1.1 w/cm2 50% pulsed x 10 minutes (5 minutes each)   Ultrasound Goals Pain                PT Education - 01/22/15 1116    Education provided Yes   Education Details yellow [place]   Person(s) Educated Patient   Methods Explanation;Demonstration;Tactile cues;Verbal cues;Handout   Comprehension Verbalized understanding          PT Short Term Goals - 01/22/15 1123    PT SHORT TERM GOAL #1  Title pain in bilateral shoulder with daily activities decreased >/= 25%   Time 3   Period Weeks   Status On-going   PT SHORT TERM GOAL #2   Title pain in bilateral shoulder with getting out of bed decreased >/= 25%   Time 3   Period Weeks   Status On-going   PT SHORT TERM GOAL #3   Title pain with pushing out of a chair decreased >/= 25%   Time 3   Period Weeks   Status On-going           PT Long Term Goals - 01/18/15 1111    PT LONG TERM GOAL #1   Title pain with daily activities decreased >/= 75%   Time 6   Period Weeks   Status New   PT LONG TERM GOAL #2   Title pain with getting out of bed decreased >/= 75%   Time 6   Period Weeks   Status New   PT LONG TERM GOAL #3   Title pain with pushing out of a chair decreased >/= 75%   Time 6   Period Weeks    Status New   PT LONG TERM GOAL #4   Title Bilateral shoulder strength >/= 4+/5   Time 6   Period Weeks   Status New               Plan - 01/22/15 1122    Clinical Impression Statement Pt with only 1 PT session after evaluation.  Pt is independent in initial HEP and tolerated advancement of exercises well. Pain is the same so no progress toward STGs today.    Pt will benefit from skilled therapeutic intervention in order to improve on the following deficits Decreased range of motion;Impaired flexibility;Pain;Impaired UE functional use;Increased muscle spasms;Decreased mobility;Decreased strength   Rehab Potential Good   PT Frequency 2x / week   PT Duration 6 weeks   PT Treatment/Interventions Moist Heat;Therapeutic activities;Patient/family education;Passive range of motion;Therapeutic exercise;Ultrasound;Manual techniques;Neuromuscular re-education;Other (comment)   PT Next Visit Plan review [place], continue gentle ROM and strenth, continue ultrasound if beneficial.     PT Home Exercise Plan Reveiw [place]   Consulted and Agree with Plan of Care Patient        Problem List Patient Active Problem List   Diagnosis Date Noted  . Nausea alone 05/31/2014  . Malnutrition of moderate degree 05/30/2014  . Weakness 05/29/2014  . Tachycardia 05/29/2014  . HNP (herniated nucleus pulposus), lumbar 05/25/2014  . Electrolyte abnormality 05/12/2014  . Hypokalemia 05/12/2014  . Hyponatremia 05/12/2014  . Lumbosacral radiculopathy 05/12/2014  . Hematuria 05/12/2014  . Benign essential hypertension 05/12/2014  . Hyperlipidemia 05/12/2014  . Hypothyroid 05/12/2014    Mordche Hedglin, PT 01/22/2015, 11:52 AM  [facility] [address] [place], [place], [address] Phone: [phone]   Fax:  [phone]

## 2015-01-25 ENCOUNTER — Encounter: Payer: Self-pay | Admitting: Physical Therapy

## 2015-01-25 ENCOUNTER — Ambulatory Visit: Payer: Medicare Other | Admitting: Physical Therapy

## 2015-01-25 DIAGNOSIS — M25512 Pain in left shoulder: Secondary | ICD-10-CM | POA: Diagnosis not present

## 2015-01-25 DIAGNOSIS — I10 Essential (primary) hypertension: Secondary | ICD-10-CM | POA: Diagnosis not present

## 2015-01-25 DIAGNOSIS — R531 Weakness: Secondary | ICD-10-CM | POA: Diagnosis not present

## 2015-01-25 DIAGNOSIS — H40033 Anatomical narrow angle, bilateral: Secondary | ICD-10-CM | POA: Diagnosis not present

## 2015-01-25 DIAGNOSIS — E039 Hypothyroidism, unspecified: Secondary | ICD-10-CM | POA: Diagnosis not present

## 2015-01-25 DIAGNOSIS — R29898 Other symptoms and signs involving the musculoskeletal system: Secondary | ICD-10-CM

## 2015-01-25 DIAGNOSIS — H26493 Other secondary cataract, bilateral: Secondary | ICD-10-CM | POA: Diagnosis not present

## 2015-01-25 DIAGNOSIS — M25511 Pain in right shoulder: Secondary | ICD-10-CM | POA: Diagnosis not present

## 2015-01-25 DIAGNOSIS — H3531 Nonexudative age-related macular degeneration: Secondary | ICD-10-CM | POA: Diagnosis not present

## 2015-01-25 DIAGNOSIS — E785 Hyperlipidemia, unspecified: Secondary | ICD-10-CM | POA: Diagnosis not present

## 2015-01-25 DIAGNOSIS — M25619 Stiffness of unspecified shoulder, not elsewhere classified: Secondary | ICD-10-CM

## 2015-01-25 NOTE — Patient Instructions (Signed)
Flexibility: Corner Stretch   Standing in corner with hands just above shoulder level or in doorway and feet __12__ inches from corner, lean forward until a comfortable stretch is felt across chest. Hold _15___ seconds. Repeat __3__ times per set. Do ___1_ sets per session. Do __1__ sessions per day.  http://orth.exer.us/343   Copyright  VHI. All rights reserved.

## 2015-01-25 NOTE — Therapy (Signed)
[facility] [address], [place], [place], [address] Phone: [phone]   Fax:  [phone]  Physical Therapy Treatment  Patient Details  Name: Tammy Allison MRN: [id number] Date of Birth: 1935/04/18 Referring Provider:  [name], MD  Encounter Date: 01/25/2015      PT End of Session - 01/25/15 1315    Visit Number 3   Number of Visits 10  Medicare   Date for PT Re-Evaluation 03/01/15   PT Start Time 1230   PT Stop Time 1310   PT Time Calculation (min) 40 min   Activity Tolerance Patient tolerated treatment well   Behavior During Therapy [facility] for tasks assessed/performed      Past Medical History  Diagnosis Date  . Hypertension   . Hyperlipidemia   . Hypothyroidism   . GERD (gastroesophageal reflux disease)   . H/O hiatal hernia   . Arthritis   . Irritable bowel syndrome     Past Surgical History  Procedure Laterality Date  . Thyroid surgery    . Fracture surgery      right hip  . Eye surgery Bilateral     cataract surgery w/ lens implant  . Lumbar laminectomy/decompression microdiscectomy Left 05/25/2014    Procedure: LUMBAR LAMINECTOMY/DECOMPRESSION MICRODISCECTOMY 1 LEVEL   lumbar three;  Surgeon: Hosie Spangle, MD;  Location: [place] [facility];  Service: Neurosurgery;  Laterality: Left;    There were no vitals filed for this visit.  Visit Diagnosis:  Decreased range of motion (ROM) of shoulder  Shoulder weakness      Subjective Assessment - 01/25/15 1232    Symptoms I am sore today.  I felt good after last visit.  Ultrasound did help. Right arm is worse than left.    Pertinent History Lamenectomy 05/25/2014   Limitations Lifting;House hold activities   How long can you sit comfortably? no difficulty   How long can you stand comfortably? no difficulty   How long can you walk comfortably? no difficulty   Patient Stated Goals Reduce pain and return to prior function   Currently in Pain?  Yes   Pain Score 3    Pain Location Shoulder   Pain Orientation Right;Left   Pain Descriptors / Indicators Sharp;Sore   Pain Type Chronic pain   Pain Onset More than a month ago   Aggravating Factors  moving arms at night   Pain Relieving Factors Not moving arms   Multiple Pain Sites No                       [facility] Adult PT Treatment/Exercise - 01/25/15 0001    Posture/Postural Control   Posture Comments educated patient on upright posture to improve shoulder ROM   Shoulder Exercises: Prone   Other Prone Exercises chin tuck with bilateral shoulder ext. hold 5 sec. 8 x   Shoulder Exercises: Pulleys   Flexion 3 minutes   ABduction 2 minutes   Shoulder Exercises: ROM/Strengthening   UBE (Upper Arm Bike) Level 1x 6 minutes (3/3)  VC to sit upright, tactile cues to increase trunk mobility   Modalities   Modalities Ultrasound   Ultrasound   Ultrasound Location Lt and Rt. shoulder   Ultrasound Parameters 1.1w/cm2, 50%, 5 min. each shoulder, 1 mhz                PT Education - 01/25/15 1315    Education provided Yes   Education Details doorway stretch  Person(s) Educated Patient   Methods Explanation;Demonstration;Verbal cues;Handout   Comprehension Verbalized understanding;Returned demonstration          PT Short Term Goals - 01/22/15 1123    PT SHORT TERM GOAL #1   Title pain in bilateral shoulder with daily activities decreased >/= 25%   Time 3   Period Weeks   Status On-going   PT SHORT TERM GOAL #2   Title pain in bilateral shoulder with getting out of bed decreased >/= 25%   Time 3   Period Weeks   Status On-going   PT SHORT TERM GOAL #3   Title pain with pushing out of a chair decreased >/= 25%   Time 3   Period Weeks   Status On-going           PT Long Term Goals - 01/18/15 1111    PT LONG TERM GOAL #1   Title pain with daily activities decreased >/= 75%   Time 6   Period Weeks   Status New   PT LONG TERM GOAL #2   Title  pain with getting out of bed decreased >/= 75%   Time 6   Period Weeks   Status New   PT LONG TERM GOAL #3   Title pain with pushing out of a chair decreased >/= 75%   Time 6   Period Weeks   Status New   PT LONG TERM GOAL #4   Title Bilateral shoulder strength >/= 4+/5   Time 6   Period Weeks   Status New               Plan - 01/25/15 1316    Clinical Impression Statement Patient was very fatiqued during exercise making it difficult with strengthening.  Patient seeing MD today due to fatique.    Pt will benefit from skilled therapeutic intervention in order to improve on the following deficits Decreased range of motion;Impaired flexibility;Pain;Impaired UE functional use;Increased muscle spasms;Decreased mobility;Decreased strength   Rehab Potential Good   Clinical Impairments Affecting Rehab Potential None   PT Frequency 2x / week   PT Duration 6 weeks   PT Treatment/Interventions Moist Heat;Therapeutic activities;Patient/family education;Passive range of motion;Therapeutic exercise;Ultrasound;Manual techniques;Neuromuscular re-education;Other (comment)   PT Next Visit Plan See what MD says about fatique, work on scapular strength, pain of bil. shoulders   PT Home Exercise Plan prone scapular retraction   Recommended Other Services None   Consulted and Agree with Plan of Care Patient        Problem List Patient Active Problem List   Diagnosis Date Noted  . Nausea alone 05/31/2014  . Malnutrition of moderate degree 05/30/2014  . Weakness 05/29/2014  . Tachycardia 05/29/2014  . HNP (herniated nucleus pulposus), lumbar 05/25/2014  . Electrolyte abnormality 05/12/2014  . Hypokalemia 05/12/2014  . Hyponatremia 05/12/2014  . Lumbosacral radiculopathy 05/12/2014  . Hematuria 05/12/2014  . Benign essential hypertension 05/12/2014  . Hyperlipidemia 05/12/2014  . Hypothyroid 05/12/2014    GRAY,CHERYL,PT 01/25/2015, 1:19 PM  [facility] [address] [place], [place], [address] Phone: [phone]   Fax:  [phone]

## 2015-01-26 DIAGNOSIS — R531 Weakness: Secondary | ICD-10-CM | POA: Diagnosis not present

## 2015-01-29 ENCOUNTER — Ambulatory Visit: Payer: Medicare Other

## 2015-01-29 DIAGNOSIS — I10 Essential (primary) hypertension: Secondary | ICD-10-CM | POA: Diagnosis not present

## 2015-01-29 DIAGNOSIS — R29898 Other symptoms and signs involving the musculoskeletal system: Secondary | ICD-10-CM | POA: Diagnosis not present

## 2015-01-29 DIAGNOSIS — E785 Hyperlipidemia, unspecified: Secondary | ICD-10-CM | POA: Diagnosis not present

## 2015-01-29 DIAGNOSIS — M25619 Stiffness of unspecified shoulder, not elsewhere classified: Secondary | ICD-10-CM

## 2015-01-29 DIAGNOSIS — M25511 Pain in right shoulder: Secondary | ICD-10-CM | POA: Diagnosis not present

## 2015-01-29 DIAGNOSIS — M25512 Pain in left shoulder: Secondary | ICD-10-CM | POA: Diagnosis not present

## 2015-01-29 DIAGNOSIS — E039 Hypothyroidism, unspecified: Secondary | ICD-10-CM | POA: Diagnosis not present

## 2015-01-29 NOTE — Therapy (Signed)
[facility] [address], [place], [place], [address] Phone: [phone]   Fax:  [phone]  Physical Therapy Treatment  Patient Details  Name: Tammy Allison MRN: [id number] Date of Birth: [birth year] Referring Provider:  [name], MD  Encounter Date: 01/29/2015      PT End of Session - 01/29/15 1211    Visit Number 4   Number of Visits 10  Medicare   Date for PT Re-Evaluation 03/01/15   PT Start Time 4580   PT Stop Time 1228   PT Time Calculation (min) 47 min      Past Medical History  Diagnosis Date  . Hypertension   . Hyperlipidemia   . Hypothyroidism   . GERD (gastroesophageal reflux disease)   . H/O hiatal hernia   . Arthritis   . Irritable bowel syndrome     Past Surgical History  Procedure Laterality Date  . Thyroid surgery    . Fracture surgery      right hip  . Eye surgery Bilateral     cataract surgery w/ lens implant  . Lumbar laminectomy/decompression microdiscectomy Left 05/25/2014    Procedure: LUMBAR LAMINECTOMY/DECOMPRESSION MICRODISCECTOMY 1 LEVEL   lumbar three;  Surgeon: Hosie Spangle, MD;  Location: [place] [facility];  Service: Neurosurgery;  Laterality: Left;    There were no vitals filed for this visit.  Visit Diagnosis:  Decreased range of motion (ROM) of shoulder  Shoulder weakness      Subjective Assessment - 01/29/15 1139    Symptoms Pt reports "soreness" over the weekend secondary to overdoing it over the weekend.     Currently in Pain? Yes   Pain Score 4    Pain Location Shoulder   Pain Orientation Right;Left   Pain Descriptors / Indicators Sore   Pain Type Chronic pain   Pain Onset More than a month ago   Aggravating Factors  night hours, after being still at night for long periods   Pain Relieving Factors ice, not moving arms   Multiple Pain Sites No                       [facility] Adult PT Treatment/Exercise - 01/29/15 0001    Shoulder  Exercises: Standing   Flexion AAROM;Both;10 reps;Weights  finger ladder   Shoulder Flexion Weight (lbs) 1   Shoulder Exercises: Pulleys   Flexion 3 minutes   ABduction 3 minutes   Shoulder Exercises: ROM/Strengthening   UBE (Upper Arm Bike) Level 1x 6 minutes (3/3)  VC to sit upright, tactile cues to increase trunk mobility   Other ROM/Strengthening Exercises [place] 4 ways with yellow theraband. Straight elbow with flexion and extension 2x10 bil. each  good demo of HEP   Shoulder Exercises: IT sales professional 3 reps;20 seconds  Pt with good return demo of HEP issued last session   Modalities   Modalities Ultrasound   Ultrasound   Ultrasound Location Lt and Rt shoulder   Ultrasound Parameters 1.1 w/cm2 50% pulsed x 10 minutes (5 each)   Ultrasound Goals Pain                  PT Short Term Goals - 01/29/15 1147    PT SHORT TERM GOAL #1   Title pain in bilateral shoulder with daily activities decreased >/= 25%   Status Achieved   PT SHORT TERM GOAL #2   Title pain in bilateral shoulder with getting out of bed  decreased >/= 25%   Status Achieved   PT SHORT TERM GOAL #3   Title pain with pushing out of a chair decreased >/= 25%   Status Achieved           PT Long Term Goals - 01/29/15 1148    PT LONG TERM GOAL #1   Title pain with daily activities decreased >/= 75%   Status On-going  50% improvement since the start of care   PT LONG TERM GOAL #2   Title pain with getting out of bed decreased >/= 75%   Time 6   Period Weeks   Status On-going  50% improvement reported                Plan - 01/29/15 1145    Clinical Impression Statement Pt reports 50% overall improvement since the start of care.  See goals for assessment.     Pt will benefit from skilled therapeutic intervention in order to improve on the following deficits Decreased range of motion;Impaired flexibility;Pain;Impaired UE functional use;Increased muscle spasms;Decreased  mobility;Decreased strength   Rehab Potential Good   PT Frequency 2x / week   PT Duration 8 weeks   PT Treatment/Interventions Moist Heat;Therapeutic activities;Patient/family education;Passive range of motion;Therapeutic exercise;Ultrasound;Manual techniques;Neuromuscular re-education;Other (comment)   PT Next Visit Plan Bilateral shoulder/scapular strength, modalities PRN   PT Home Exercise Plan prone scapular retraction   Consulted and Agree with Plan of Care Patient        Problem List Patient Active Problem List   Diagnosis Date Noted  . Nausea alone 05/31/2014  . Malnutrition of moderate degree 05/30/2014  . Weakness 05/29/2014  . Tachycardia 05/29/2014  . HNP (herniated nucleus pulposus), lumbar 05/25/2014  . Electrolyte abnormality 05/12/2014  . Hypokalemia 05/12/2014  . Hyponatremia 05/12/2014  . Lumbosacral radiculopathy 05/12/2014  . Hematuria 05/12/2014  . Benign essential hypertension 05/12/2014  . Hyperlipidemia 05/12/2014  . Hypothyroid 05/12/2014    Marek Nghiem, PT 01/29/2015, 12:12 PM  [facility] [address], [place], [place], [address] Phone: [phone]   Fax:  [phone]

## 2015-01-31 ENCOUNTER — Ambulatory Visit: Payer: Medicare Other

## 2015-01-31 DIAGNOSIS — M25512 Pain in left shoulder: Secondary | ICD-10-CM | POA: Diagnosis not present

## 2015-01-31 DIAGNOSIS — E039 Hypothyroidism, unspecified: Secondary | ICD-10-CM | POA: Diagnosis not present

## 2015-01-31 DIAGNOSIS — M25619 Stiffness of unspecified shoulder, not elsewhere classified: Secondary | ICD-10-CM

## 2015-01-31 DIAGNOSIS — M25511 Pain in right shoulder: Secondary | ICD-10-CM | POA: Diagnosis not present

## 2015-01-31 DIAGNOSIS — R29898 Other symptoms and signs involving the musculoskeletal system: Secondary | ICD-10-CM | POA: Diagnosis not present

## 2015-01-31 DIAGNOSIS — E785 Hyperlipidemia, unspecified: Secondary | ICD-10-CM | POA: Diagnosis not present

## 2015-01-31 DIAGNOSIS — I10 Essential (primary) hypertension: Secondary | ICD-10-CM | POA: Diagnosis not present

## 2015-01-31 NOTE — Therapy (Signed)
[facility] [address], [place], [place], [address] Phone: [phone]   Fax:  [phone]  Physical Therapy Treatment  Patient Details  Name: Tammy Allison MRN: [id number] Date of Birth: 03-11-1935 Referring Provider:  [name], MD  Encounter Date: 01/31/2015      PT End of Session - 01/31/15 0955    Visit Number 5   Number of Visits 10  Medicare   Date for PT Re-Evaluation 03/01/15   PT Start Time 0935   PT Stop Time 9702   PT Time Calculation (min) 39 min   Activity Tolerance Patient tolerated treatment well   Behavior During Therapy [facility] for tasks assessed/performed      Past Medical History  Diagnosis Date  . Hypertension   . Hyperlipidemia   . Hypothyroidism   . GERD (gastroesophageal reflux disease)   . H/O hiatal hernia   . Arthritis   . Irritable bowel syndrome     Past Surgical History  Procedure Laterality Date  . Thyroid surgery    . Fracture surgery      right hip  . Eye surgery Bilateral     cataract surgery w/ lens implant  . Lumbar laminectomy/decompression microdiscectomy Left 05/25/2014    Procedure: LUMBAR LAMINECTOMY/DECOMPRESSION MICRODISCECTOMY 1 LEVEL   lumbar three;  Surgeon: Hosie Spangle, MD;  Location: [place] [facility];  Service: Neurosurgery;  Laterality: Left;    There were no vitals filed for this visit.  Visit Diagnosis:  Decreased range of motion (ROM) of shoulder  Shoulder weakness      Subjective Assessment - 01/31/15 0937    Symptoms Pt reports that she is feeling fatigue and feels like it might be her blood pressure medication.     Currently in Pain? Yes   Pain Score 1    Pain Location Shoulder   Pain Orientation Left;Right   Pain Descriptors / Indicators Sore                       [facility] Adult PT Treatment/Exercise - 01/31/15 0001    Shoulder Exercises: Standing   Flexion AAROM;Both;10 reps;Weights  finger ladder   Shoulder  Flexion Weight (lbs) 1   Shoulder Exercises: Pulleys   Flexion 3 minutes   ABduction 3 minutes   Shoulder Exercises: ROM/Strengthening   UBE (Upper Arm Bike) Level 1x 6 minutes (3/3)   Graduated Retraction with Theraband rowing with green band 2x10  tactile cues to relax shoulders   Ultrasound   Ultrasound Location Lt and Rt   Ultrasound Parameters 1.1 W/cm2 50% pulesed x 10 minutes (5 min each)   Ultrasound Goals Pain                  PT Short Term Goals - 01/29/15 1147    PT SHORT TERM GOAL #1   Title pain in bilateral shoulder with daily activities decreased >/= 25%   Status Achieved   PT SHORT TERM GOAL #2   Title pain in bilateral shoulder with getting out of bed decreased >/= 25%   Status Achieved   PT SHORT TERM GOAL #3   Title pain with pushing out of a chair decreased >/= 25%   Status Achieved           PT Long Term Goals - 01/29/15 1148    PT LONG TERM GOAL #1   Title pain with daily activities decreased >/= 75%   Status On-going  50% improvement  since the start of care   PT LONG TERM GOAL #2   Title pain with getting out of bed decreased >/= 75%   Time 6   Period Weeks   Status On-going  50% improvement reported                Plan - 01/31/15 0945    Clinical Impression Statement Pt tolerated treatment well today.  She was feeling fatigue not related to treatment.     Pt will benefit from skilled therapeutic intervention in order to improve on the following deficits Decreased range of motion;Impaired flexibility;Pain;Impaired UE functional use;Increased muscle spasms;Decreased mobility;Decreased strength   Rehab Potential Good   PT Frequency 2x / week   PT Duration 8 weeks   PT Treatment/Interventions Moist Heat;Therapeutic activities;Patient/family education;Passive range of motion;Therapeutic exercise;Ultrasound;Manual techniques;Neuromuscular re-education;Other (comment)   PT Next Visit Plan Bilateral shoulder/scapular strength,  modalities PRN   Consulted and Agree with Plan of Care Patient        Problem List Patient Active Problem List   Diagnosis Date Noted  . Nausea alone 05/31/2014  . Malnutrition of moderate degree 05/30/2014  . Weakness 05/29/2014  . Tachycardia 05/29/2014  . HNP (herniated nucleus pulposus), lumbar 05/25/2014  . Electrolyte abnormality 05/12/2014  . Hypokalemia 05/12/2014  . Hyponatremia 05/12/2014  . Lumbosacral radiculopathy 05/12/2014  . Hematuria 05/12/2014  . Benign essential hypertension 05/12/2014  . Hyperlipidemia 05/12/2014  . Hypothyroid 05/12/2014    Annalissa Murphey, PT 01/31/2015, 9:57 AM  [facility] [address] [place], [place], [address] Phone: [phone]   Fax:  [phone]

## 2015-02-08 ENCOUNTER — Encounter: Payer: Self-pay | Admitting: Physical Therapy

## 2015-02-08 ENCOUNTER — Ambulatory Visit: Payer: Medicare Other | Admitting: Physical Therapy

## 2015-02-08 DIAGNOSIS — M25511 Pain in right shoulder: Secondary | ICD-10-CM | POA: Diagnosis not present

## 2015-02-08 DIAGNOSIS — M25619 Stiffness of unspecified shoulder, not elsewhere classified: Secondary | ICD-10-CM

## 2015-02-08 DIAGNOSIS — R29898 Other symptoms and signs involving the musculoskeletal system: Secondary | ICD-10-CM

## 2015-02-08 DIAGNOSIS — E785 Hyperlipidemia, unspecified: Secondary | ICD-10-CM | POA: Diagnosis not present

## 2015-02-08 DIAGNOSIS — E039 Hypothyroidism, unspecified: Secondary | ICD-10-CM | POA: Diagnosis not present

## 2015-02-08 DIAGNOSIS — I10 Essential (primary) hypertension: Secondary | ICD-10-CM | POA: Diagnosis not present

## 2015-02-08 DIAGNOSIS — M25512 Pain in left shoulder: Secondary | ICD-10-CM | POA: Diagnosis not present

## 2015-02-08 NOTE — Therapy (Signed)
[facility] [address], [place], [place], [address] Phone: [phone]   Fax:  [phone]  Physical Therapy Treatment  Patient Details  Name: Tammy Allison MRN: [id number] Date of Birth: 07-Sep-1935 Referring Provider:  [name], MD  Encounter Date: 02/08/2015      PT End of Session - 02/08/15 0953    Visit Number 6   Number of Visits 10  Medicare   Date for PT Re-Evaluation 03/01/15   PT Start Time 0935   PT Stop Time 1015   PT Time Calculation (min) 40 min   Activity Tolerance Patient tolerated treatment well   Behavior During Therapy [facility] for tasks assessed/performed      Past Medical History  Diagnosis Date  . Hypertension   . Hyperlipidemia   . Hypothyroidism   . GERD (gastroesophageal reflux disease)   . H/O hiatal hernia   . Arthritis   . Irritable bowel syndrome     Past Surgical History  Procedure Laterality Date  . Thyroid surgery    . Fracture surgery      right hip  . Eye surgery Bilateral     cataract surgery w/ lens implant  . Lumbar laminectomy/decompression microdiscectomy Left 05/25/2014    Procedure: LUMBAR LAMINECTOMY/DECOMPRESSION MICRODISCECTOMY 1 LEVEL   lumbar three;  Surgeon: Hosie Spangle, MD;  Location: [place] [facility];  Service: Neurosurgery;  Laterality: Left;    There were no vitals filed for this visit.  Visit Diagnosis:  Decreased range of motion (ROM) of shoulder  Shoulder weakness      Subjective Assessment - 02/08/15 0939    Symptoms My right shoulder is better. Left shoulder pain decreased by 90%.    Pertinent History Lamenectomy 05/25/2014   Limitations Lifting;House hold activities   How long can you sit comfortably? no difficulty   How long can you stand comfortably? no difficulty   How long can you walk comfortably? no difficulty   Patient Stated Goals Reduce pain and return to prior function   Currently in Pain? Yes   Pain Score 7    Pain  Location Shoulder   Pain Orientation Right   Pain Descriptors / Indicators Sore   Pain Type Chronic pain   Pain Onset More than a month ago   Pain Frequency Intermittent   Aggravating Factors  increased activity, mopping, reach behind back, at night   Pain Relieving Factors not using it   Multiple Pain Sites No   Multiple Pain Sites No            [facility] PT Assessment - 02/08/15 0001    Strength   Right Shoulder Flexion 3+/5   Right Shoulder ABduction 4-/5  pain   Right Shoulder Internal Rotation 5/5   Right Shoulder External Rotation 4/5   Left Shoulder Flexion 5/5   Left Shoulder ABduction 4+/5   Left Shoulder Internal Rotation 5/5   Left Shoulder External Rotation 4/5                   [facility] Adult PT Treatment/Exercise - 02/08/15 0001    Shoulder Exercises: Standing   Flexion AAROM;Both;10 reps;Weights  finger ladder   Shoulder Flexion Weight (lbs) 1  2 pounds on left   Retraction Strengthening;Both;10 reps;Weights  lean over counter   Retraction Weight (lbs) 2   Other Standing Exercises lean over counter horizontal abd. 10 bil.    Modalities   Modalities Ultrasound   Ultrasound   Ultrasound Location  right shoulder   Ultrasound Parameters 0.8 w/cm2, 50% 8 min 50mhz   Ultrasound Goals Pain                  PT Short Term Goals - 01/29/15 1147    PT SHORT TERM GOAL #1   Title pain in bilateral shoulder with daily activities decreased >/= 25%   Status Achieved   PT SHORT TERM GOAL #2   Title pain in bilateral shoulder with getting out of bed decreased >/= 25%   Status Achieved   PT SHORT TERM GOAL #3   Title pain with pushing out of a chair decreased >/= 25%   Status Achieved           PT Long Term Goals - 02/08/15 0941    PT LONG TERM GOAL #1   Title pain with daily activities decreased >/= 75%   Time 6   Period Weeks   Status On-going  No change at this time   PT LONG TERM GOAL #2   Title pain with getting out of bed decreased  >/= 75%   Time 6   Period Weeks   Status Achieved  80% better   PT LONG TERM GOAL #3   Title pain with pushing out of a chair decreased >/= 75%   Time 6   Period Weeks   Status Achieved   PT LONG TERM GOAL #4   Title Bilateral shoulder strength >/= 4+/5   Time 6   Period Weeks   Status On-going  working on strength               Plan - 02/08/15 1013    Pt will benefit from skilled therapeutic intervention in order to improve on the following deficits Decreased range of motion;Impaired flexibility;Pain;Impaired UE functional use;Increased muscle spasms;Decreased mobility;Decreased strength   Rehab Potential Good   PT Frequency 2x / week   PT Treatment/Interventions Moist Heat;Therapeutic activities;Patient/family education;Passive range of motion;Therapeutic exercise;Ultrasound;Manual techniques;Neuromuscular re-education;Other (comment)   PT Next Visit Plan soft tissue work, scapula strengthening   PT Home Exercise Plan prone scapular exercises   Recommended Other Services None   Consulted and Agree with Plan of Care Patient        Problem List Patient Active Problem List   Diagnosis Date Noted  . Nausea alone 05/31/2014  . Malnutrition of moderate degree 05/30/2014  . Weakness 05/29/2014  . Tachycardia 05/29/2014  . HNP (herniated nucleus pulposus), lumbar 05/25/2014  . Electrolyte abnormality 05/12/2014  . Hypokalemia 05/12/2014  . Hyponatremia 05/12/2014  . Lumbosacral radiculopathy 05/12/2014  . Hematuria 05/12/2014  . Benign essential hypertension 05/12/2014  . Hyperlipidemia 05/12/2014  . Hypothyroid 05/12/2014    Mostafa Yuan,PT 02/08/2015, 10:15 AM  [facility] [address] [place], [place], [address] Phone: [phone]   Fax:  [phone]

## 2015-02-12 ENCOUNTER — Encounter: Payer: Self-pay | Admitting: Physical Therapy

## 2015-02-12 ENCOUNTER — Ambulatory Visit: Payer: Medicare Other | Admitting: Physical Therapy

## 2015-02-12 DIAGNOSIS — E785 Hyperlipidemia, unspecified: Secondary | ICD-10-CM | POA: Diagnosis not present

## 2015-02-12 DIAGNOSIS — R29898 Other symptoms and signs involving the musculoskeletal system: Secondary | ICD-10-CM | POA: Diagnosis not present

## 2015-02-12 DIAGNOSIS — M25512 Pain in left shoulder: Secondary | ICD-10-CM | POA: Diagnosis not present

## 2015-02-12 DIAGNOSIS — M25511 Pain in right shoulder: Secondary | ICD-10-CM | POA: Diagnosis not present

## 2015-02-12 DIAGNOSIS — E039 Hypothyroidism, unspecified: Secondary | ICD-10-CM | POA: Diagnosis not present

## 2015-02-12 DIAGNOSIS — M25619 Stiffness of unspecified shoulder, not elsewhere classified: Secondary | ICD-10-CM

## 2015-02-12 DIAGNOSIS — I10 Essential (primary) hypertension: Secondary | ICD-10-CM | POA: Diagnosis not present

## 2015-02-12 NOTE — Therapy (Signed)
[facility] [address], [place], [place], [address] Phone: [phone]   Fax:  [phone]  Physical Therapy Treatment  Patient Details  Name: Tammy Allison MRN: [id number] Date of Birth: 07/28/1935 Referring Provider:  [name], MD  Encounter Date: 02/12/2015      PT End of Session - 02/12/15 1227    Visit Number 7   Number of Visits 10  Medicare   Date for PT Re-Evaluation 03/01/15   PT Start Time 3532   PT Stop Time 1228   PT Time Calculation (min) 43 min   Activity Tolerance Patient tolerated treatment well   Behavior During Therapy [facility] for tasks assessed/performed      Past Medical History  Diagnosis Date  . Hypertension   . Hyperlipidemia   . Hypothyroidism   . GERD (gastroesophageal reflux disease)   . H/O hiatal hernia   . Arthritis   . Irritable bowel syndrome     Past Surgical History  Procedure Laterality Date  . Thyroid surgery    . Fracture surgery      right hip  . Eye surgery Bilateral     cataract surgery w/ lens implant  . Lumbar laminectomy/decompression microdiscectomy Left 05/25/2014    Procedure: LUMBAR LAMINECTOMY/DECOMPRESSION MICRODISCECTOMY 1 LEVEL   lumbar three;  Surgeon: Hosie Spangle, MD;  Location: [place] [facility];  Service: Neurosurgery;  Laterality: Left;    There were no vitals filed for this visit.  Visit Diagnosis:  Decreased range of motion (ROM) of shoulder  Shoulder weakness      Subjective Assessment - 02/12/15 1151    Symptoms I still have soreness especially after I wake up in the morning. The cane exercises will loosen the shoulders up.    Pertinent History Lamenectomy 05/25/2014   Limitations Lifting;House hold activities   How long can you sit comfortably? no difficulty   How long can you stand comfortably? no difficulty   How long can you walk comfortably? no difficulty   Patient Stated Goals Reduce pain and return to prior function   Currently in Pain? Yes   Pain Score 5    Pain Location Shoulder   Pain Orientation Right   Pain Descriptors / Indicators Sore   Pain Type Chronic pain   Pain Onset More than a month ago   Pain Frequency Intermittent   Aggravating Factors  in the morning, reaching upward.    Pain Relieving Factors not using it, cane exercises   Multiple Pain Sites No            [facility] PT Assessment - 02/12/15 0001    PROM   Right Shoulder Flexion 180 Degrees   Right Shoulder ABduction 180 Degrees   Right Shoulder Internal Rotation 70 Degrees   Right Shoulder External Rotation 90 Degrees   Left Shoulder Flexion 180 Degrees   Left Shoulder ABduction 180 Degrees   Left Shoulder Internal Rotation 70 Degrees   Left Shoulder External Rotation 90 Degrees                   [facility] Adult PT Treatment/Exercise - 02/12/15 0001    Shoulder Exercises: Standing   Flexion AROM;Strengthening;Both;10 reps   Shoulder Flexion Weight (lbs) --  1# right, 2# left   Shoulder Exercises: ROM/Strengthening   UBE (Upper Arm Bike) level 1 4 min backward.    Manual Therapy   Manual Therapy Massage   Massage soft tissue work to bil shoulders and interscapular  area                PT Education - 02/12/15 1204    Education provided Yes   Education Details scapula strengthening   Person(s) Educated Patient   Methods Explanation;Demonstration;Handout   Comprehension Returned demonstration;Verbalized understanding          PT Short Term Goals - 01/29/15 1147    PT SHORT TERM GOAL #1   Title pain in bilateral shoulder with daily activities decreased >/= 25%   Status Achieved   PT SHORT TERM GOAL #2   Title pain in bilateral shoulder with getting out of bed decreased >/= 25%   Status Achieved   PT SHORT TERM GOAL #3   Title pain with pushing out of a chair decreased >/= 25%   Status Achieved           PT Long Term Goals - 02/12/15 1228    PT LONG TERM GOAL #1   Title pain with daily  activities decreased >/= 75%   Time 6   Period Weeks   Status On-going  continues to have soreness   PT LONG TERM GOAL #2   Title pain with getting out of bed decreased >/= 75%   Time 6   Period Weeks   Status Achieved   PT LONG TERM GOAL #3   Title pain with pushing out of a chair decreased >/= 75%   Time 6   Period Weeks   Status Achieved   PT LONG TERM GOAL #4   Title Bilateral shoulder strength >/= 4+/5   Time 6   Period Weeks   Status On-going               Plan - 02/12/15 1225    Clinical Impression Statement Patient has muscle soreness in bilateral shoulder.  Patient forward head posture puts a strain on her shoulders. Patient has full bil. shoulders PROM.    Pt will benefit from skilled therapeutic intervention in order to improve on the following deficits Decreased range of motion;Impaired flexibility;Pain;Impaired UE functional use;Increased muscle spasms;Decreased mobility;Decreased strength   Rehab Potential Good   Clinical Impairments Affecting Rehab Potential None   PT Frequency 2x / week   PT Duration 8 weeks   PT Treatment/Interventions Moist Heat;Therapeutic activities;Patient/family education;Passive range of motion;Therapeutic exercise;Ultrasound;Manual techniques;Neuromuscular re-education;Other (comment)   PT Next Visit Plan soft tissue work, scapula strengthening   PT Home Exercise Plan continue with current HEP   Recommended Other Services None   Consulted and Agree with Plan of Care Patient        Problem List Patient Active Problem List   Diagnosis Date Noted  . Nausea alone 05/31/2014  . Malnutrition of moderate degree 05/30/2014  . Weakness 05/29/2014  . Tachycardia 05/29/2014  . HNP (herniated nucleus pulposus), lumbar 05/25/2014  . Electrolyte abnormality 05/12/2014  . Hypokalemia 05/12/2014  . Hyponatremia 05/12/2014  . Lumbosacral radiculopathy 05/12/2014  . Hematuria 05/12/2014  . Benign essential hypertension 05/12/2014  .  Hyperlipidemia 05/12/2014  . Hypothyroid 05/12/2014    Kasen Adduci,PT 02/12/2015, 12:29 PM  [facility] [address] [place], [place], [address] Phone: [phone]   Fax:  [phone]

## 2015-02-12 NOTE — Patient Instructions (Addendum)
Scapular Retraction: Abduction / Extension (Prone)   Stand with left arm leaning on counter, right arm out from sides 90. Pinch shoulder blades together and raise arms a few inches from floor. Then do left arm with 1 pound.  Repeat _10___ times per set. Do __1__ sets per session. Do ___1_ sessions per day.  http://orth.exer.us/958   Copyright  VHI. All rights reserved.  Scapular: Retraction (Prone)   Stand with left arm on the counter. Holding __1__ pound weights, keep arms out from sides and elbows bent. Pull elbows back, pinching shoulder blades together. Then do left arm with 2 pounds. Repeat _10___ times per set. Do __1__ sets per session. Do __1__ sessions per day.  http://orth.exer.us/862   Copyright  VHI. All rights reserved.  Patient is able to return demonstration correctly.

## 2015-02-14 ENCOUNTER — Ambulatory Visit: Payer: Medicare Other

## 2015-02-14 DIAGNOSIS — R29898 Other symptoms and signs involving the musculoskeletal system: Secondary | ICD-10-CM

## 2015-02-14 DIAGNOSIS — M25511 Pain in right shoulder: Secondary | ICD-10-CM | POA: Diagnosis not present

## 2015-02-14 DIAGNOSIS — M25619 Stiffness of unspecified shoulder, not elsewhere classified: Secondary | ICD-10-CM

## 2015-02-14 DIAGNOSIS — E785 Hyperlipidemia, unspecified: Secondary | ICD-10-CM | POA: Diagnosis not present

## 2015-02-14 DIAGNOSIS — E039 Hypothyroidism, unspecified: Secondary | ICD-10-CM | POA: Diagnosis not present

## 2015-02-14 DIAGNOSIS — M25512 Pain in left shoulder: Secondary | ICD-10-CM | POA: Diagnosis not present

## 2015-02-14 DIAGNOSIS — I10 Essential (primary) hypertension: Secondary | ICD-10-CM | POA: Diagnosis not present

## 2015-02-14 NOTE — Therapy (Signed)
[facility] [address], [place], [place], [address] Phone: [phone]   Fax:  [phone]  Physical Therapy Treatment  Patient Details  Name: Tammy Allison MRN: [id number] Date of Birth: 1935-03-16 Referring Provider:  [name], MD  Encounter Date: 02/14/2015      PT End of Session - 02/14/15 0920    Visit Number 8   Number of Visits 10  Medicare   Date for PT Re-Evaluation 03/01/15   PT Start Time 0086   PT Stop Time 0922   PT Time Calculation (min) 35 min   Activity Tolerance Patient tolerated treatment well   Behavior During Therapy [facility] for tasks assessed/performed      Past Medical History  Diagnosis Date  . Hypertension   . Hyperlipidemia   . Hypothyroidism   . GERD (gastroesophageal reflux disease)   . H/O hiatal hernia   . Arthritis   . Irritable bowel syndrome     Past Surgical History  Procedure Laterality Date  . Thyroid surgery    . Fracture surgery      right hip  . Eye surgery Bilateral     cataract surgery w/ lens implant  . Lumbar laminectomy/decompression microdiscectomy Left 05/25/2014    Procedure: LUMBAR LAMINECTOMY/DECOMPRESSION MICRODISCECTOMY 1 LEVEL   lumbar three;  Surgeon: Hosie Spangle, MD;  Location: [place] [facility];  Service: Neurosurgery;  Laterality: Left;    There were no vitals filed for this visit.  Visit Diagnosis:  Decreased range of motion (ROM) of shoulder  Shoulder weakness      Subjective Assessment - 02/14/15 0847    Symptoms I feel less sore in both shoulders.     Currently in Pain? Yes   Pain Score 4    Pain Location Shoulder   Pain Orientation Right   Pain Descriptors / Indicators Sore   Pain Type Chronic pain   Pain Onset More than a month ago   Pain Frequency Intermittent   Aggravating Factors  reaching up and back   Pain Relieving Factors rest, stretching, heat/ice   Multiple Pain Sites No                        [facility] Adult PT Treatment/Exercise - 02/14/15 0001    Shoulder Exercises: Standing   Flexion AROM;Strengthening;Both;10 reps   Shoulder Flexion Weight (lbs) --  1# right, 2# left   Other Standing Exercises wall push-ups 2x10   Shoulder Exercises: Pulleys   Flexion 2 minutes   ABduction 2 minutes   Shoulder Exercises: ROM/Strengthening   UBE (Upper Arm Bike) level 1 6 min backward.    Manual Therapy   Manual Therapy Joint mobilization;Passive ROM   Joint Mobilization Rt shoulder glenohumeral joint mobs inferior glide and posterior glide   Passive ROM PROM into ER, IR and flexion                  PT Short Term Goals - 01/29/15 1147    PT SHORT TERM GOAL #1   Title pain in bilateral shoulder with daily activities decreased >/= 25%   Status Achieved   PT SHORT TERM GOAL #2   Title pain in bilateral shoulder with getting out of bed decreased >/= 25%   Status Achieved   PT SHORT TERM GOAL #3   Title pain with pushing out of a chair decreased >/= 25%   Status Achieved  PT Long Term Goals - 02/14/15 0849    PT LONG TERM GOAL #1   Title pain with daily activities decreased >/= 75%   Time 6   Period Weeks   Status On-going  60-70% improvement   PT LONG TERM GOAL #2   Title pain with getting out of bed decreased >/= 75%   Time 6   Period Weeks   Status On-going  60-70%   PT LONG TERM GOAL #3   Title pain with pushing out of a chair decreased >/= 75%   Status Achieved               Plan - 02/14/15 0851    Clinical Impression Statement Pt with soreness only in the UEs. Rt>Lt pain.  See goals for assessment.  Pt reports 60-70% overall improvement.   Pt will benefit from skilled therapeutic intervention in order to improve on the following deficits Decreased range of motion;Impaired flexibility;Pain;Impaired UE functional use;Increased muscle spasms;Decreased mobility;Decreased strength   Rehab Potential Good   PT Frequency 2x / week   PT Duration  8 weeks   PT Treatment/Interventions Moist Heat;Therapeutic activities;Patient/family education;Passive range of motion;Therapeutic exercise;Ultrasound;Manual techniques;Neuromuscular re-education;Other (comment)   PT Next Visit Plan UE strength, scapular strength, manual   Consulted and Agree with Plan of Care Patient        Problem List Patient Active Problem List   Diagnosis Date Noted  . Nausea alone 05/31/2014  . Malnutrition of moderate degree 05/30/2014  . Weakness 05/29/2014  . Tachycardia 05/29/2014  . HNP (herniated nucleus pulposus), lumbar 05/25/2014  . Electrolyte abnormality 05/12/2014  . Hypokalemia 05/12/2014  . Hyponatremia 05/12/2014  . Lumbosacral radiculopathy 05/12/2014  . Hematuria 05/12/2014  . Benign essential hypertension 05/12/2014  . Hyperlipidemia 05/12/2014  . Hypothyroid 05/12/2014    Deray Dawes, PT 02/14/2015, 9:22 AM  [facility] [address], [place], [place], [address] Phone: [phone]   Fax:  [phone]

## 2015-02-19 ENCOUNTER — Ambulatory Visit: Payer: Medicare Other | Attending: Family Medicine | Admitting: Physical Therapy

## 2015-02-19 ENCOUNTER — Encounter: Payer: Self-pay | Admitting: Physical Therapy

## 2015-02-19 DIAGNOSIS — K219 Gastro-esophageal reflux disease without esophagitis: Secondary | ICD-10-CM | POA: Diagnosis not present

## 2015-02-19 DIAGNOSIS — E785 Hyperlipidemia, unspecified: Secondary | ICD-10-CM | POA: Insufficient documentation

## 2015-02-19 DIAGNOSIS — R29898 Other symptoms and signs involving the musculoskeletal system: Secondary | ICD-10-CM | POA: Diagnosis not present

## 2015-02-19 DIAGNOSIS — E039 Hypothyroidism, unspecified: Secondary | ICD-10-CM | POA: Diagnosis not present

## 2015-02-19 DIAGNOSIS — M25512 Pain in left shoulder: Secondary | ICD-10-CM | POA: Diagnosis not present

## 2015-02-19 DIAGNOSIS — M1990 Unspecified osteoarthritis, unspecified site: Secondary | ICD-10-CM | POA: Diagnosis not present

## 2015-02-19 DIAGNOSIS — I10 Essential (primary) hypertension: Secondary | ICD-10-CM | POA: Insufficient documentation

## 2015-02-19 DIAGNOSIS — M25511 Pain in right shoulder: Secondary | ICD-10-CM | POA: Insufficient documentation

## 2015-02-19 DIAGNOSIS — M25619 Stiffness of unspecified shoulder, not elsewhere classified: Secondary | ICD-10-CM

## 2015-02-19 NOTE — Therapy (Signed)
[facility] [address] [place], [place], [address] Phone: [phone]   Fax:  [phone]  Physical Therapy Treatment  Patient Details  Name: Tammy Allison MRN: [id number] Date of Birth: January 06, 1935 Referring Provider:  [name], MD  Encounter Date: 02/19/2015      PT End of Session - 02/19/15 0948    Visit Number 9   Number of Visits 10  Medicare   Date for PT Re-Evaluation 03/01/15   PT Start Time 0930   PT Stop Time 4580   PT Time Calculation (min) 45 min   Activity Tolerance Patient tolerated treatment well   Behavior During Therapy [facility] for tasks assessed/performed      Past Medical History  Diagnosis Date  . Hypertension   . Hyperlipidemia   . Hypothyroidism   . GERD (gastroesophageal reflux disease)   . H/O hiatal hernia   . Arthritis   . Irritable bowel syndrome     Past Surgical History  Procedure Laterality Date  . Thyroid surgery    . Fracture surgery      right hip  . Eye surgery Bilateral     cataract surgery w/ lens implant  . Lumbar laminectomy/decompression microdiscectomy Left 05/25/2014    Procedure: LUMBAR LAMINECTOMY/DECOMPRESSION MICRODISCECTOMY 1 LEVEL   lumbar three;  Surgeon: Hosie Spangle, MD;  Location: [place] [facility];  Service: Neurosurgery;  Laterality: Left;    There were no vitals filed for this visit.  Visit Diagnosis:  Shoulder weakness  Decreased range of motion (ROM) of shoulder      Subjective Assessment - 02/19/15 0940    Subjective My shoulders feel sore. Patient reports she is getting more done around the house.  I am getting sore with the weights. My left shoulder is better than right.  Left shoulder is 80% and right is 70% better. 70% easier to reach behind her back.    Pertinent History Lamenectomy 05/25/2014   Limitations Lifting;House hold activities   How long can you sit comfortably? no difficulty   How long can you stand comfortably? no  difficulty   How long can you walk comfortably? no difficulty   Patient Stated Goals Reduce pain and return to prior function   Currently in Pain? Yes   Pain Score 4    Pain Location Shoulder   Pain Orientation Right;Left   Pain Descriptors / Indicators Sore   Pain Type Chronic pain   Pain Onset More than a month ago   Pain Frequency Intermittent   Aggravating Factors  reaching back, housework   Pain Relieving Factors rest, stretching, heat/ice   Multiple Pain Sites No                       [facility] Adult PT Treatment/Exercise - 02/19/15 0001    Shoulder Exercises: Standing   Flexion AROM;Strengthening;Both;10 reps   Shoulder Flexion Weight (lbs) --  right 1 # 10x,    Other Standing Exercises wall push-ups 2x10  10 time elbow out to side, 10 elbows to side   Shoulder Exercises: ROM/Strengthening   UBE (Upper Arm Bike) level 1 backwards 4 min   Manual Therapy   Manual Therapy Joint mobilization;Massage;Myofascial release   Joint Mobilization Rt shoulder glenohumeral joint mobs inferior glide and posterior glide   Massage soft tissue work to right shoulders and interscapular area   Myofascial Release to right shoulder girdle to improve mobility   Passive ROM PROM into ER, IR  and flexion, and abduction                 PT Education - 02/19/15 1012    Education provided No          PT Short Term Goals - 01/29/15 1147    PT SHORT TERM GOAL #1   Title pain in bilateral shoulder with daily activities decreased >/= 25%   Status Achieved   PT SHORT TERM GOAL #2   Title pain in bilateral shoulder with getting out of bed decreased >/= 25%   Status Achieved   PT SHORT TERM GOAL #3   Title pain with pushing out of a chair decreased >/= 25%   Status Achieved           PT Long Term Goals - 02/19/15 0946    PT LONG TERM GOAL #1   Title pain with daily activities decreased >/= 75%  right 60%, left is 80% better   Time 6   Period Weeks   Status On-going    PT LONG TERM GOAL #2   Title pain with getting out of bed decreased >/= 75%   Time 6   Period Weeks   Status Achieved   PT LONG TERM GOAL #3   Title pain with pushing out of a chair decreased >/= 75%   Time 6   Period Weeks   Status Achieved   PT LONG TERM GOAL #4   Title Bilateral shoulder strength >/= 4+/5   Period Weeks   Status On-going               Plan - 02/19/15 0949    Clinical Impression Statement Patient has full right shoulder PROM and tolerates the end range with less pain. Patient will continue for 1 more week to increase strength and endurance of bil. shoulders.  Patient is tolerating her home tasks with greater ease.    Pt will benefit from skilled therapeutic intervention in order to improve on the following deficits Decreased range of motion;Impaired flexibility;Pain;Impaired UE functional use;Increased muscle spasms;Decreased mobility;Decreased strength   Rehab Potential Good   Clinical Impairments Affecting Rehab Potential None   PT Frequency 2x / week   PT Duration 8 weeks   PT Next Visit Plan G-Code, FOTO, review HEP   PT Home Exercise Plan continue with current HEP   Recommended Other Services None   Consulted and Agree with Plan of Care Patient        Problem List Patient Active Problem List   Diagnosis Date Noted  . Nausea alone 05/31/2014  . Malnutrition of moderate degree 05/30/2014  . Weakness 05/29/2014  . Tachycardia 05/29/2014  . HNP (herniated nucleus pulposus), lumbar 05/25/2014  . Electrolyte abnormality 05/12/2014  . Hypokalemia 05/12/2014  . Hyponatremia 05/12/2014  . Lumbosacral radiculopathy 05/12/2014  . Hematuria 05/12/2014  . Benign essential hypertension 05/12/2014  . Hyperlipidemia 05/12/2014  . Hypothyroid 05/12/2014    Gwenlyn Hottinger,PT 02/19/2015, 10:17 AM  [facility] [address] [place], [place], [address] Phone: [phone]   Fax:   [phone]

## 2015-02-21 ENCOUNTER — Encounter: Payer: Self-pay | Admitting: Physical Therapy

## 2015-02-21 ENCOUNTER — Ambulatory Visit: Payer: Medicare Other | Admitting: Physical Therapy

## 2015-02-21 DIAGNOSIS — R29898 Other symptoms and signs involving the musculoskeletal system: Secondary | ICD-10-CM

## 2015-02-21 DIAGNOSIS — M25512 Pain in left shoulder: Secondary | ICD-10-CM | POA: Diagnosis not present

## 2015-02-21 DIAGNOSIS — E785 Hyperlipidemia, unspecified: Secondary | ICD-10-CM | POA: Diagnosis not present

## 2015-02-21 DIAGNOSIS — M25619 Stiffness of unspecified shoulder, not elsewhere classified: Secondary | ICD-10-CM

## 2015-02-21 DIAGNOSIS — E039 Hypothyroidism, unspecified: Secondary | ICD-10-CM | POA: Diagnosis not present

## 2015-02-21 DIAGNOSIS — M25511 Pain in right shoulder: Secondary | ICD-10-CM | POA: Diagnosis not present

## 2015-02-21 DIAGNOSIS — I10 Essential (primary) hypertension: Secondary | ICD-10-CM | POA: Diagnosis not present

## 2015-02-21 NOTE — Therapy (Signed)
[facility] [address] [place], [place], [address] Phone: [phone]   Fax:  [phone]  Physical Therapy Treatment  Patient Details  Name: Tammy Allison MRN: [id number] Date of Birth: Sep 04, 1935 Referring Provider:  [name], MD  Encounter Date: 02/21/2015      PT End of Session - 02/21/15 0933    Visit Number 10   Date for PT Re-Evaluation 03/01/15   PT Start Time 0930   PT Stop Time 0093   PT Time Calculation (min) 45 min   Activity Tolerance Patient tolerated treatment well   Behavior During Therapy [facility] for tasks assessed/performed      Past Medical History  Diagnosis Date  . Hypertension   . Hyperlipidemia   . Hypothyroidism   . GERD (gastroesophageal reflux disease)   . H/O hiatal hernia   . Arthritis   . Irritable bowel syndrome     Past Surgical History  Procedure Laterality Date  . Thyroid surgery    . Fracture surgery      right hip  . Eye surgery Bilateral     cataract surgery w/ lens implant  . Lumbar laminectomy/decompression microdiscectomy Left 05/25/2014    Procedure: LUMBAR LAMINECTOMY/DECOMPRESSION MICRODISCECTOMY 1 LEVEL   lumbar three;  Surgeon: Hosie Spangle, MD;  Location: [place] [facility];  Service: Neurosurgery;  Laterality: Left;    There were no vitals filed for this visit.  Visit Diagnosis:  Shoulder weakness  Decreased range of motion (ROM) of shoulder      Subjective Assessment - 02/21/15 0939    Subjective My right shoulder has been sore since last treatment.    Pertinent History Lamenectomy 05/25/2014   Limitations Lifting;House hold activities   How long can you sit comfortably? no difficulty   How long can you stand comfortably? no difficulty   How long can you walk comfortably? no difficulty   Patient Stated Goals Reduce pain and return to prior function   Currently in Pain? Yes   Pain Score 5    Pain Location Shoulder   Pain Orientation Right    Pain Descriptors / Indicators Sore   Pain Type Chronic pain   Pain Onset More than a month ago   Pain Frequency Intermittent   Aggravating Factors  moving or using shoulder   Pain Relieving Factors rest, stretch, heat/ice   Multiple Pain Sites No            [facility] PT Assessment - 02/21/15 0001    Strength   Right Shoulder Flexion 4-/5   Right Shoulder ABduction 4-/5   Right Shoulder External Rotation 4/5                   [facility] Adult PT Treatment/Exercise - 02/21/15 0001    Shoulder Exercises: ROM/Strengthening   UBE (Upper Arm Bike) level 1 backwards 4 min   Ball on Wall up/down 26min, side to side 1 min. cc/cw 1 min   Ultrasound   Ultrasound Location right shoulder   Ultrasound Parameters 50%, 0.8 w/cm2, 8 min, 1 mhz   Ultrasound Goals Pain   Manual Therapy   Manual Therapy Massage;Passive ROM   Massage soft tissue work to right shoulder                PT Education - 02/21/15 1002    Education provided No          PT Short Term Goals - 01/29/15 1147    PT SHORT  TERM GOAL #1   Title pain in bilateral shoulder with daily activities decreased >/= 25%   Status Achieved   PT SHORT TERM GOAL #2   Title pain in bilateral shoulder with getting out of bed decreased >/= 25%   Status Achieved   PT SHORT TERM GOAL #3   Title pain with pushing out of a chair decreased >/= 25%   Status Achieved           PT Long Term Goals - 02/19/15 0946    PT LONG TERM GOAL #1   Title pain with daily activities decreased >/= 75%  right 60%, left is 80% better   Time 6   Period Weeks   Status On-going   PT LONG TERM GOAL #2   Title pain with getting out of bed decreased >/= 75%   Time 6   Period Weeks   Status Achieved   PT LONG TERM GOAL #3   Title pain with pushing out of a chair decreased >/= 75%   Time 6   Period Weeks   Status Achieved   PT LONG TERM GOAL #4   Title Bilateral shoulder strength >/= 4+/5   Period Weeks   Status On-going                Plan - March 16, 2015 1010    Clinical Impression Statement Patient has improved ROM and strength of right shoulder.  Patient is continuing to have pain in right shoulder.  Patient is going to see the doctor for the right shoulder.    Pt will benefit from skilled therapeutic intervention in order to improve on the following deficits Decreased range of motion;Impaired flexibility;Pain;Impaired UE functional use;Increased muscle spasms;Decreased mobility;Decreased strength   Rehab Potential Good   Clinical Impairments Affecting Rehab Potential None   PT Frequency 2x / week   PT Duration 8 weeks   PT Treatment/Interventions Moist Heat;Therapeutic activities;Patient/family education;Passive range of motion;Therapeutic exercise;Ultrasound;Manual techniques;Neuromuscular re-education;Other (comment)   PT Next Visit Plan Patient on hold till she sees the doctor   PT Home Exercise Plan continue with current HEP   Consulted and Agree with Plan of Care Patient          G-Codes - 2015-03-16 0941    Functional Assessment Tool Used FOTO score 44%   Functional Limitation Other PT primary   Other PT Primary Current Status (X4481) At least 40 percent but less than 60 percent impaired, limited or restricted   Other PT Primary Goal Status (E5631) At least 20 percent but less than 40 percent impaired, limited or restricted      Problem List Patient Active Problem List   Diagnosis Date Noted  . Nausea alone 05/31/2014  . Malnutrition of moderate degree 05/30/2014  . Weakness 05/29/2014  . Tachycardia 05/29/2014  . HNP (herniated nucleus pulposus), lumbar 05/25/2014  . Electrolyte abnormality 05/12/2014  . Hypokalemia 05/12/2014  . Hyponatremia 05/12/2014  . Lumbosacral radiculopathy 05/12/2014  . Hematuria 05/12/2014  . Benign essential hypertension 05/12/2014  . Hyperlipidemia 05/12/2014  . Hypothyroid 05/12/2014    GRAY,CHERYL,PT 2015-03-16, 10:12 AM  [facility] [address] [place], [place], [address] Phone: [phone]   Fax:  [phone]

## 2015-02-25 DIAGNOSIS — L03011 Cellulitis of right finger: Secondary | ICD-10-CM | POA: Diagnosis not present

## 2015-02-26 ENCOUNTER — Ambulatory Visit: Payer: Medicare Other | Admitting: Physical Therapy

## 2015-02-26 DIAGNOSIS — L03119 Cellulitis of unspecified part of limb: Secondary | ICD-10-CM | POA: Diagnosis not present

## 2015-02-26 DIAGNOSIS — M25511 Pain in right shoulder: Secondary | ICD-10-CM | POA: Diagnosis not present

## 2015-02-28 ENCOUNTER — Ambulatory Visit: Payer: Medicare Other

## 2015-02-28 DIAGNOSIS — I10 Essential (primary) hypertension: Secondary | ICD-10-CM | POA: Diagnosis not present

## 2015-02-28 DIAGNOSIS — R29898 Other symptoms and signs involving the musculoskeletal system: Secondary | ICD-10-CM | POA: Diagnosis not present

## 2015-02-28 DIAGNOSIS — M25512 Pain in left shoulder: Secondary | ICD-10-CM | POA: Diagnosis not present

## 2015-02-28 DIAGNOSIS — E039 Hypothyroidism, unspecified: Secondary | ICD-10-CM | POA: Diagnosis not present

## 2015-02-28 DIAGNOSIS — M25511 Pain in right shoulder: Secondary | ICD-10-CM | POA: Diagnosis not present

## 2015-02-28 DIAGNOSIS — E785 Hyperlipidemia, unspecified: Secondary | ICD-10-CM | POA: Diagnosis not present

## 2015-02-28 NOTE — Therapy (Addendum)
[facility] [address] [place], [place], [address] Phone: [phone]   Fax:  [phone]  Physical Therapy Treatment  Patient Details  Name: Tammy Allison MRN: [id number] Date of Birth: 05/05/1935 Referring Provider:  [name], MD  Encounter Date: 02/28/2015      PT End of Session - 02/28/15 1000    Visit Number 11   Number of Visits 20  Medicare   Date for PT Re-Evaluation 03/01/15   PT Start Time 0931   PT Stop Time 1010   PT Time Calculation (min) 39 min   Activity Tolerance Patient tolerated treatment well   Behavior During Therapy [facility] for tasks assessed/performed      Past Medical History  Diagnosis Date  . Hypertension   . Hyperlipidemia   . Hypothyroidism   . GERD (gastroesophageal reflux disease)   . H/O hiatal hernia   . Arthritis   . Irritable bowel syndrome     Past Surgical History  Procedure Laterality Date  . Thyroid surgery    . Fracture surgery      right hip  . Eye surgery Bilateral     cataract surgery w/ lens implant  . Lumbar laminectomy/decompression microdiscectomy Left 05/25/2014    Procedure: LUMBAR LAMINECTOMY/DECOMPRESSION MICRODISCECTOMY 1 LEVEL   lumbar three;  Surgeon: Hosie Spangle, MD;  Location: [place] [facility];  Service: Neurosurgery;  Laterality: Left;    There were no vitals filed for this visit.  Visit Diagnosis:  Pain in joint, shoulder region, right      Subjective Assessment - 02/28/15 0935    Subjective Rt shoulder is worse.  Going to see orthopedic MD tomorrow.     Currently in Pain? Yes   Pain Score 8    Pain Location Shoulder   Pain Orientation Right   Pain Descriptors / Indicators Sore   Pain Type Chronic pain   Pain Onset More than a month ago   Pain Frequency Intermittent   Aggravating Factors  moving the shoulder   Pain Relieving Factors rest, stretch, heat/ice            [facility] PT Assessment - 02/28/15 0001    Assessment    Medical Diagnosis Shoulder pain   Observation/Other Assessments   Focus on Therapeutic Outcomes (FOTO)  44% limitation  02/21/15                   [facility] Adult PT Treatment/Exercise - 02/28/15 0001    Modalities   Modalities Electrical Stimulation;Moist Heat   Moist Heat Therapy   Number Minutes Moist Heat 15 Minutes   Moist Heat Location Other (comment)  rt shoulder   Electrical Stimulation   Electrical Stimulation Location Rt shoulder   Electrical Stimulation Action IFC   Electrical Stimulation Parameters 15 minutes   Electrical Stimulation Goals Pain   Ultrasound   Ultrasound Location Rt shoulder   Ultrasound Parameters 50% 1.0 w/cm2 x 9 minutes   Ultrasound Goals Pain                  PT Short Term Goals - 01/29/15 1147    PT SHORT TERM GOAL #1   Title pain in bilateral shoulder with daily activities decreased >/= 25%   Status Achieved   PT SHORT TERM GOAL #2   Title pain in bilateral shoulder with getting out of bed decreased >/= 25%   Status Achieved   PT SHORT TERM GOAL #3   Title pain with  pushing out of a chair decreased >/= 25%   Status Achieved           PT Long Term Goals - 02/28/15 1001    PT LONG TERM GOAL #1   Title pain with daily activities decreased >/= 75%   Status On-going   PT LONG TERM GOAL #2   Title pain with getting out of bed decreased >/= 75%   Status On-going   PT LONG TERM GOAL #3   Title pain with pushing out of a chair decreased >/= 75%   Status Achieved   PT LONG TERM GOAL #4   Title Bilateral shoulder strength >/= 4+/5   Status On-going               Plan - 02/28/15 1000    Clinical Impression Statement Pt with significant Rt shoulder pain and will see orthopedic MD tomorrow.  No new goals met secondary to flare-up in Rt shoulder.     Pt will benefit from skilled therapeutic intervention in order to improve on the following deficits Decreased range of motion;Impaired flexibility;Pain;Impaired UE  functional use;Increased muscle spasms;Decreased mobility;Decreased strength   Rehab Potential Good   PT Frequency 2x / week   PT Duration 8 weeks   PT Treatment/Interventions Moist Heat;Therapeutic activities;Patient/family education;Passive range of motion;Therapeutic exercise;Ultrasound;Manual techniques;Neuromuscular re-education;Other (comment)   PT Next Visit Plan Hold until pt sees MD.  D/C if pt doesnt return.  Renewal needed if pt returns next week.     Consulted and Agree with Plan of Care Patient        Problem List Patient Active Problem List   Diagnosis Date Noted  . Nausea alone 05/31/2014  . Malnutrition of moderate degree 05/30/2014  . Weakness 05/29/2014  . Tachycardia 05/29/2014  . HNP (herniated nucleus pulposus), lumbar 05/25/2014  . Electrolyte abnormality 05/12/2014  . Hypokalemia 05/12/2014  . Hyponatremia 05/12/2014  . Lumbosacral radiculopathy 05/12/2014  . Hematuria 05/12/2014  . Benign essential hypertension 05/12/2014  . Hyperlipidemia 05/12/2014  . Hypothyroid 05/12/2014    Abrielle Finck, PT 02/28/2015, 10:04 AM PHYSICAL THERAPY DISCHARGE SUMMARY  Visits from Start of Care: 11  Current functional level related to goals / functional outcomes: Pt with continued shoulder pain.  See above for goal assessment.  Pt was going to see orthopedic MD 03/01/15.     Remaining deficits: Shoulder pain that limits function.  Pt has HEP in place and was going to see MD to discuss further options.     Education / Equipment: HEP, RICE Plan: Patient agrees to discharge.  Patient goals were partially met. Patient is being discharged due to the patient's request.  ?????   Sigurd Sos, PT 03/20/2015 7:20 AM   [facility] [address] [place], [place], [address] Phone: [phone]   Fax:  [phone]

## 2015-03-01 DIAGNOSIS — M25511 Pain in right shoulder: Secondary | ICD-10-CM | POA: Diagnosis not present

## 2015-05-28 DIAGNOSIS — E780 Pure hypercholesterolemia: Secondary | ICD-10-CM | POA: Diagnosis not present

## 2015-05-28 DIAGNOSIS — K219 Gastro-esophageal reflux disease without esophagitis: Secondary | ICD-10-CM | POA: Diagnosis not present

## 2015-05-28 DIAGNOSIS — E876 Hypokalemia: Secondary | ICD-10-CM | POA: Diagnosis not present

## 2015-05-28 DIAGNOSIS — R109 Unspecified abdominal pain: Secondary | ICD-10-CM | POA: Diagnosis not present

## 2015-05-28 DIAGNOSIS — E039 Hypothyroidism, unspecified: Secondary | ICD-10-CM | POA: Diagnosis not present

## 2015-05-28 DIAGNOSIS — M859 Disorder of bone density and structure, unspecified: Secondary | ICD-10-CM | POA: Diagnosis not present

## 2015-06-05 DIAGNOSIS — M19011 Primary osteoarthritis, right shoulder: Secondary | ICD-10-CM | POA: Diagnosis not present

## 2015-11-01 DIAGNOSIS — H40013 Open angle with borderline findings, low risk, bilateral: Secondary | ICD-10-CM | POA: Diagnosis not present

## 2015-11-29 DIAGNOSIS — K219 Gastro-esophageal reflux disease without esophagitis: Secondary | ICD-10-CM | POA: Diagnosis not present

## 2015-11-29 DIAGNOSIS — I10 Essential (primary) hypertension: Secondary | ICD-10-CM | POA: Diagnosis not present

## 2015-11-29 DIAGNOSIS — R109 Unspecified abdominal pain: Secondary | ICD-10-CM | POA: Diagnosis not present

## 2015-11-29 DIAGNOSIS — E039 Hypothyroidism, unspecified: Secondary | ICD-10-CM | POA: Diagnosis not present

## 2015-11-29 DIAGNOSIS — E876 Hypokalemia: Secondary | ICD-10-CM | POA: Diagnosis not present

## 2015-11-29 DIAGNOSIS — K580 Irritable bowel syndrome with diarrhea: Secondary | ICD-10-CM | POA: Diagnosis not present

## 2015-11-29 DIAGNOSIS — E7800 Pure hypercholesterolemia, unspecified: Secondary | ICD-10-CM | POA: Diagnosis not present

## 2016-01-16 DIAGNOSIS — M5412 Radiculopathy, cervical region: Secondary | ICD-10-CM | POA: Diagnosis not present

## 2016-01-16 DIAGNOSIS — M19011 Primary osteoarthritis, right shoulder: Secondary | ICD-10-CM | POA: Diagnosis not present

## 2016-01-28 DIAGNOSIS — M5412 Radiculopathy, cervical region: Secondary | ICD-10-CM | POA: Diagnosis not present

## 2016-01-30 DIAGNOSIS — H353122 Nonexudative age-related macular degeneration, left eye, intermediate dry stage: Secondary | ICD-10-CM | POA: Diagnosis not present

## 2016-01-30 DIAGNOSIS — H40033 Anatomical narrow angle, bilateral: Secondary | ICD-10-CM | POA: Diagnosis not present

## 2016-01-30 DIAGNOSIS — H04123 Dry eye syndrome of bilateral lacrimal glands: Secondary | ICD-10-CM | POA: Diagnosis not present

## 2016-01-30 DIAGNOSIS — H353112 Nonexudative age-related macular degeneration, right eye, intermediate dry stage: Secondary | ICD-10-CM | POA: Diagnosis not present

## 2016-01-30 DIAGNOSIS — Z961 Presence of intraocular lens: Secondary | ICD-10-CM | POA: Diagnosis not present

## 2016-01-30 DIAGNOSIS — H26493 Other secondary cataract, bilateral: Secondary | ICD-10-CM | POA: Diagnosis not present

## 2016-02-01 DIAGNOSIS — Z1231 Encounter for screening mammogram for malignant neoplasm of breast: Secondary | ICD-10-CM | POA: Diagnosis not present

## 2016-02-15 DIAGNOSIS — I10 Essential (primary) hypertension: Secondary | ICD-10-CM | POA: Diagnosis not present

## 2016-05-28 DIAGNOSIS — E039 Hypothyroidism, unspecified: Secondary | ICD-10-CM | POA: Diagnosis not present

## 2016-05-28 DIAGNOSIS — M859 Disorder of bone density and structure, unspecified: Secondary | ICD-10-CM | POA: Diagnosis not present

## 2016-05-28 DIAGNOSIS — E876 Hypokalemia: Secondary | ICD-10-CM | POA: Diagnosis not present

## 2016-05-28 DIAGNOSIS — K219 Gastro-esophageal reflux disease without esophagitis: Secondary | ICD-10-CM | POA: Diagnosis not present

## 2016-05-28 DIAGNOSIS — K580 Irritable bowel syndrome with diarrhea: Secondary | ICD-10-CM | POA: Diagnosis not present

## 2016-05-28 DIAGNOSIS — I10 Essential (primary) hypertension: Secondary | ICD-10-CM | POA: Diagnosis not present

## 2016-10-29 DIAGNOSIS — H40033 Anatomical narrow angle, bilateral: Secondary | ICD-10-CM | POA: Diagnosis not present

## 2016-11-19 DIAGNOSIS — K219 Gastro-esophageal reflux disease without esophagitis: Secondary | ICD-10-CM | POA: Diagnosis not present

## 2016-11-19 DIAGNOSIS — E039 Hypothyroidism, unspecified: Secondary | ICD-10-CM | POA: Diagnosis not present

## 2016-11-19 DIAGNOSIS — I10 Essential (primary) hypertension: Secondary | ICD-10-CM | POA: Diagnosis not present

## 2016-11-19 DIAGNOSIS — E7800 Pure hypercholesterolemia, unspecified: Secondary | ICD-10-CM | POA: Diagnosis not present

## 2017-02-10 DIAGNOSIS — M19011 Primary osteoarthritis, right shoulder: Secondary | ICD-10-CM | POA: Diagnosis not present

## 2017-02-10 DIAGNOSIS — G8929 Other chronic pain: Secondary | ICD-10-CM | POA: Diagnosis not present

## 2017-02-10 DIAGNOSIS — M25512 Pain in left shoulder: Secondary | ICD-10-CM | POA: Diagnosis not present

## 2017-02-11 DIAGNOSIS — Z1231 Encounter for screening mammogram for malignant neoplasm of breast: Secondary | ICD-10-CM | POA: Diagnosis not present

## 2017-02-18 DIAGNOSIS — E039 Hypothyroidism, unspecified: Secondary | ICD-10-CM | POA: Diagnosis not present

## 2017-03-30 DIAGNOSIS — I10 Essential (primary) hypertension: Secondary | ICD-10-CM | POA: Diagnosis not present

## 2017-03-30 DIAGNOSIS — K219 Gastro-esophageal reflux disease without esophagitis: Secondary | ICD-10-CM | POA: Diagnosis not present

## 2017-04-23 DIAGNOSIS — H26493 Other secondary cataract, bilateral: Secondary | ICD-10-CM | POA: Diagnosis not present

## 2017-04-23 DIAGNOSIS — H353132 Nonexudative age-related macular degeneration, bilateral, intermediate dry stage: Secondary | ICD-10-CM | POA: Diagnosis not present

## 2017-04-23 DIAGNOSIS — Z961 Presence of intraocular lens: Secondary | ICD-10-CM | POA: Diagnosis not present

## 2017-04-23 DIAGNOSIS — H40013 Open angle with borderline findings, low risk, bilateral: Secondary | ICD-10-CM | POA: Diagnosis not present

## 2017-05-19 DIAGNOSIS — I10 Essential (primary) hypertension: Secondary | ICD-10-CM | POA: Diagnosis not present

## 2017-05-19 DIAGNOSIS — E039 Hypothyroidism, unspecified: Secondary | ICD-10-CM | POA: Diagnosis not present

## 2017-05-19 DIAGNOSIS — E7800 Pure hypercholesterolemia, unspecified: Secondary | ICD-10-CM | POA: Diagnosis not present

## 2017-06-23 DIAGNOSIS — E039 Hypothyroidism, unspecified: Secondary | ICD-10-CM | POA: Diagnosis not present

## 2017-06-23 DIAGNOSIS — E7800 Pure hypercholesterolemia, unspecified: Secondary | ICD-10-CM | POA: Diagnosis not present

## 2017-06-23 DIAGNOSIS — I10 Essential (primary) hypertension: Secondary | ICD-10-CM | POA: Diagnosis not present

## 2017-07-15 DIAGNOSIS — M19011 Primary osteoarthritis, right shoulder: Secondary | ICD-10-CM | POA: Diagnosis not present

## 2017-07-15 DIAGNOSIS — M19012 Primary osteoarthritis, left shoulder: Secondary | ICD-10-CM | POA: Diagnosis not present

## 2017-10-14 DIAGNOSIS — M19012 Primary osteoarthritis, left shoulder: Secondary | ICD-10-CM | POA: Diagnosis not present

## 2017-10-14 DIAGNOSIS — M19011 Primary osteoarthritis, right shoulder: Secondary | ICD-10-CM | POA: Diagnosis not present

## 2017-10-22 DIAGNOSIS — H353132 Nonexudative age-related macular degeneration, bilateral, intermediate dry stage: Secondary | ICD-10-CM | POA: Diagnosis not present

## 2017-10-22 DIAGNOSIS — H40013 Open angle with borderline findings, low risk, bilateral: Secondary | ICD-10-CM | POA: Diagnosis not present

## 2017-11-23 DIAGNOSIS — E7800 Pure hypercholesterolemia, unspecified: Secondary | ICD-10-CM | POA: Diagnosis not present

## 2017-11-23 DIAGNOSIS — K219 Gastro-esophageal reflux disease without esophagitis: Secondary | ICD-10-CM | POA: Diagnosis not present

## 2017-11-23 DIAGNOSIS — E039 Hypothyroidism, unspecified: Secondary | ICD-10-CM | POA: Diagnosis not present

## 2017-11-23 DIAGNOSIS — I10 Essential (primary) hypertension: Secondary | ICD-10-CM | POA: Diagnosis not present

## 2017-11-23 DIAGNOSIS — E876 Hypokalemia: Secondary | ICD-10-CM | POA: Diagnosis not present

## 2017-12-25 DIAGNOSIS — T466X5A Adverse effect of antihyperlipidemic and antiarteriosclerotic drugs, initial encounter: Secondary | ICD-10-CM | POA: Diagnosis not present

## 2017-12-25 DIAGNOSIS — M791 Myalgia, unspecified site: Secondary | ICD-10-CM | POA: Diagnosis not present

## 2017-12-25 DIAGNOSIS — M859 Disorder of bone density and structure, unspecified: Secondary | ICD-10-CM | POA: Diagnosis not present

## 2017-12-25 DIAGNOSIS — I10 Essential (primary) hypertension: Secondary | ICD-10-CM | POA: Diagnosis not present

## 2018-01-22 DIAGNOSIS — M791 Myalgia, unspecified site: Secondary | ICD-10-CM | POA: Diagnosis not present

## 2018-01-22 DIAGNOSIS — I10 Essential (primary) hypertension: Secondary | ICD-10-CM | POA: Diagnosis not present

## 2018-03-08 DIAGNOSIS — Z1231 Encounter for screening mammogram for malignant neoplasm of breast: Secondary | ICD-10-CM | POA: Diagnosis not present

## 2018-03-30 DIAGNOSIS — R5383 Other fatigue: Secondary | ICD-10-CM | POA: Diagnosis not present

## 2018-03-30 DIAGNOSIS — Z6825 Body mass index (BMI) 25.0-25.9, adult: Secondary | ICD-10-CM | POA: Diagnosis not present

## 2018-03-30 DIAGNOSIS — E876 Hypokalemia: Secondary | ICD-10-CM | POA: Diagnosis not present

## 2018-04-19 DIAGNOSIS — I10 Essential (primary) hypertension: Secondary | ICD-10-CM | POA: Diagnosis not present

## 2018-04-19 DIAGNOSIS — Z6824 Body mass index (BMI) 24.0-24.9, adult: Secondary | ICD-10-CM | POA: Diagnosis not present

## 2018-05-10 ENCOUNTER — Ambulatory Visit: Payer: Medicare Other | Admitting: Interventional Cardiology

## 2018-05-17 DIAGNOSIS — H26491 Other secondary cataract, right eye: Secondary | ICD-10-CM | POA: Diagnosis not present

## 2018-05-17 DIAGNOSIS — H353122 Nonexudative age-related macular degeneration, left eye, intermediate dry stage: Secondary | ICD-10-CM | POA: Diagnosis not present

## 2018-05-17 DIAGNOSIS — H40013 Open angle with borderline findings, low risk, bilateral: Secondary | ICD-10-CM | POA: Diagnosis not present

## 2018-05-17 DIAGNOSIS — H353211 Exudative age-related macular degeneration, right eye, with active choroidal neovascularization: Secondary | ICD-10-CM | POA: Diagnosis not present

## 2018-05-27 NOTE — Progress Notes (Addendum)
[facility] Note  05/28/2018     CHIEF COMPLAINT Patient presents for Retina Evaluation   HISTORY OF PRESENT ILLNESS: Tammy Allison is a 83 y.o. female who presents to the clinic today for:   HPI    Retina Evaluation    In both eyes.  This started 1 month ago.  Associated Symptoms Negative for Distortion, Redness, Trauma, Shoulder/Hip pain, Fatigue, Weight Loss, Jaw Claudication, Glare, Pain, Floaters, Flashes, Blind Spot, Photophobia, Scalp Tenderness and Fever.  Treatments tried include surgery.  Response to treatment was moderate improvement.  I, the attending physician,  performed the HPI with the patient and updated documentation appropriately.          Comments    Referral of Dr. Kathlen Mody for retina evaluation. Patient states she needs to have retina evaluation before having "film" removed from her eyes , "Dr. Kathlen Mody saw something in the right eye".Patient denies floater, flashes and ocular pain. Pt reports she has cataract sx 2015. Pt denies gtt's, she is taking Omega 3.       Last edited by Bernarda Caffey, MD on 05/28/2018  9:09 AM. (History)    Pt states she sees Dr. Kathlen Mody and states he wanted to ensure he was able to preform Yag Cap OU; Pt reports having cataract sx in 2015 OU; Pt states she has a family hx of mac degen; Pt states she is not seeing as well as she was 6 months ago;   Referring physician: Hortencia Pilar, MD [place], [place] [address]  HISTORICAL INFORMATION:   Selected notes from the MEDICAL RECORD NUMBER Referred by Dr. Quentin Ore for exu ARMD OD LEE: 07.01.19 Read Drivers) [BCVA: OD: 20/40+2 OS: 20/25] Ocular Hx-non-exu ARMD OS, open angle glaucoma, pseudophakia OU, PVD, corneal guttata, miosis, asteroid hyalosis, posterior capsular opacification OU PMH-HTN, high cholesterol, thyroid disease    CURRENT MEDICATIONS: Current Outpatient Medications (Ophthalmic Drugs)  Medication Sig  .  prednisoLONE acetate (PRED FORTE) 1 % ophthalmic suspension Place 1 drop into the right eye 2 (two) times daily.    No current facility-administered medications for this visit.  (Ophthalmic Drugs)   Current Outpatient Medications (Other)  Medication Sig  . cetirizine (ZYRTEC) 10 MG tablet Take 10 mg by mouth every morning.   . cholecalciferol (VITAMIN D) 1000 UNITS tablet Take 1,000 Units by mouth every morning.   Marland Kitchen levothyroxine (SYNTHROID, LEVOTHROID) 50 MCG tablet Take 1 tablet (50 mcg total) by mouth daily before breakfast.  . losartan (COZAAR) 25 MG tablet Take 25 mg by mouth daily.  Marland Kitchen omega-3 acid ethyl esters (LOVAZA) 1 G capsule Take 1 g by mouth every morning.   . pantoprazole (PROTONIX) 40 MG tablet Take 40 mg by mouth every morning.   . potassium chloride SA (K-DUR,KLOR-CON) 20 MEQ tablet Take 20 mEq by mouth 2 (two) times daily.  Marland Kitchen dicyclomine (BENTYL) 10 MG capsule Take 1 capsule (10 mg total) by mouth 4 (four) times daily -  before meals and at bedtime. (Patient not taking: Reported on 01/18/2015)  . docusate sodium (COLACE) 100 MG capsule Take 100 mg by mouth 2 (two) times daily as needed for mild constipation.  Marland Kitchen escitalopram (LEXAPRO) 10 MG tablet Take 10 mg by mouth daily.  . feeding supplement, RESOURCE BREEZE, (RESOURCE BREEZE) LIQD Take 1 Container by mouth 2 (two) times daily between meals. (Patient not taking: Reported on 01/18/2015)  . fentaNYL (DURAGESIC - DOSED MCG/HR) 25 MCG/HR patch Place  25 mcg onto the skin every 3 (three) days.  Marland Kitchen HYDROcodone-acetaminophen (NORCO/VICODIN) 5-325 MG per tablet Take 1 tablet by mouth every 6 (six) hours as needed for moderate pain.   Marland Kitchen metoCLOPramide (REGLAN) 5 MG tablet Take 1 tablet (5 mg total) by mouth every 6 (six) hours as needed for nausea. (Patient not taking: Reported on 01/18/2015)  . simvastatin (ZOCOR) 20 MG tablet Take 20 mg by mouth at bedtime.   Current Facility-Administered Medications (Other)  Medication Route  .  Bevacizumab (AVASTIN) SOLN 1.25 mg Intravitreal      REVIEW OF SYSTEMS: ROS    Positive for: Musculoskeletal, Eyes   Negative for: Constitutional, Gastrointestinal, Neurological, Skin, Genitourinary, HENT, Endocrine, Cardiovascular, Respiratory, Psychiatric, Allergic/Imm, Heme/Lymph   Last edited by Zenovia Jordan, LPN on 1/47/8295  6:21 AM. (History)       ALLERGIES Allergies  Allergen Reactions  . Sulfa Antibiotics Hives    PAST MEDICAL HISTORY Past Medical History:  Diagnosis Date  . Arthritis   . GERD (gastroesophageal reflux disease)   . H/O hiatal hernia   . Hyperlipidemia   . Hypertension   . Hypothyroidism   . Irritable bowel syndrome    Past Surgical History:  Procedure Laterality Date  . CATARACT EXTRACTION    . EYE SURGERY Bilateral    cataract surgery w/ lens implant  . FRACTURE SURGERY     right hip  . LUMBAR LAMINECTOMY/DECOMPRESSION MICRODISCECTOMY Left 05/25/2014   Procedure: LUMBAR LAMINECTOMY/DECOMPRESSION MICRODISCECTOMY 1 LEVEL   lumbar three;  Surgeon: Hosie Spangle, MD;  Location: [facility];  Service: Neurosurgery;  Laterality: Left;  . THYROID SURGERY      FAMILY HISTORY Family History  Problem Relation Age of Onset  . Macular degeneration Mother     SOCIAL HISTORY Social History   Tobacco Use  . Smoking status: Never Smoker  . Smokeless tobacco: Never Used  Substance Use Topics  . Alcohol use: No  . Drug use: Never         OPHTHALMIC EXAM:  Base Eye Exam    Visual Acuity (Snellen - Linear)      Right Left   Dist cc 20/60 +1 20/40   Dist ph cc 20/50 NI       Tonometry (Tonopen, 8:44 AM)      Right Left   Pressure 15 15       Pupils      Dark Light Shape React APD   Right 4 3 Round Brisk None   Left 4 3 Round Brisk None       Visual Fields (Counting fingers)      Left Right    Full Full       Extraocular Movement      Right Left    Full, Ortho Full, Ortho       Neuro/Psych    Oriented x3:  Yes    Mood/Affect:  Normal       Dilation    Both eyes:  1.0% Mydriacyl, 2.5% Phenylephrine @ 8:43 AM        Slit Lamp and Fundus Exam    Slit Lamp Exam      Right Left   Lids/Lashes Dermatochalasis - upper lid Dermatochalasis - upper lid   Conjunctiva/Sclera White and quiet White and quiet   Cornea Arcus Arcus   Anterior Chamber Deep and quiet Deep and quiet   Iris Round and dilated Round and dilated   Lens PC IOL in good position, 1+ Posterior capsular  opacification PC IOL in good position, 2-3+ Posterior capsular opacification   Vitreous Vitreous syneresis, Posterior vitreous detachment Vitreous syneresis, Posterior vitreous detachment       Fundus Exam      Right Left   Disc Pink and Sharp Pink and Sharp   C/D Ratio 0.3 0.4   Macula Large central PEDs, +SRF overlying PED, pigment mottling, Drusen Blunted foveal reflex, +PED, Drusen   Vessels Vascular attenuation Vascular attenuation   Periphery Attached Normal        Refraction    Wearing Rx      Sphere Cylinder Axis Add   Right -0.75 +0.50 089 +2.50   Left -0.75 Sphere  +2.50   Age:  6 yr   Type:  Bifocal       Manifest Refraction (Over)      Sphere Cylinder Axis Dist VA   Right -0.75 +0.50 090 20/60   Left -0.75 +0.50 180 20/40          IMAGING AND PROCEDURES  Imaging and Procedures for _0 @  OCT, Retina - OU - Both Eyes       Right Eye Quality was good. Central Foveal Thickness: 368. Progression has no prior data. Findings include abnormal foveal contour, no IRF, pigment epithelial detachment, retinal drusen , subretinal fluid.   Left Eye Quality was good. Central Foveal Thickness: 280. Progression has no prior data. Findings include normal foveal contour, pigment epithelial detachment, no IRF, no SRF.   Notes *Images captured and stored on drive  Diagnosis / Impression:  OD: Exudative ARMD OS: Non-Exudative ARMD  Clinical management:  See below  Abbreviations: NFP - Normal foveal profile.  CME - cystoid macular edema. PED - pigment epithelial detachment. IRF - intraretinal fluid. SRF - subretinal fluid. EZ - ellipsoid zone. ERM - epiretinal membrane. ORA - outer retinal atrophy. ORT - outer retinal tubulation. SRHM - subretinal hyper-reflective material         Fluorescein Angiography [place] (Transit OD)       Right Eye Progression has no prior data. Early phase findings include choroidal neovascularization. Mid/Late phase findings include choroidal neovascularization.   Left Eye Progression has no prior data. Early phase findings include normal observations. Mid/Late phase findings include normal observations.   Notes *Images captured and stored on drive;   Impression: OD: Mild central hyperfluorescence consistent with CNVM OS: normal study; no obvious CNVM         Intravitreal Injection, Pharmacologic Agent - OD - Right Eye       Time Out 05/28/2018. 10:40 AM. Confirmed correct patient, procedure, site, and patient consented.   Anesthesia Topical anesthesia was used. Anesthetic medications included Lidocaine 2%, Tetracaine 0.5%.   Procedure Preparation included 5% betadine to ocular surface, eyelid speculum. A supplied needle was used.   Injection: 1.25 mg Bevacizumab 1.59m/0.05ml   NDC: 516109-604-54   Lot: [id number]     Expiration Date: 08/19/2018   Route: Intravitreal   Site: Right Eye   Waste: 0 mg  Post-op Post injection exam found visual acuity of at least counting fingers. The patient tolerated the procedure well. There were no complications. The patient received written and verbal post procedure care education.                 ASSESSMENT/PLAN:    ICD-10-CM   1. Exudative age-related macular degeneration of right eye with active choroidal neovascularization (HCC) H35.3211 Fluorescein Angiography [place] (Transit OD)    Intravitreal Injection, Pharmacologic Agent - OD - Right Eye  Bevacizumab (AVASTIN) SOLN 1.25 mg  2.  Intermediate stage nonexudative age-related macular degeneration of left eye H35.3122 Fluorescein Angiography [place] (Transit OD)  3. Retinal edema H35.81 OCT, Retina - OU - Both Eyes  4. Posterior vitreous detachment of both eyes H43.813   5. Pseudophakia of both eyes Z96.1   6. PCO (posterior capsular opacification), bilateral H26.493     1,3. Exudative age related macular degeneration, OD   - The incidence pathology and anatomy of wet AMD discussed   - The ANCHOR, MARINA, CATT and VIEW trials discussed with patient.    - discussed treatment options including observation vs intravitreal anti-VEGF agents such as Avastin, Lucentis, Eylea.    - Risks of endophthalmitis and vascular occlusive events and atrophic changes discussed with patient  - BCVA OD 20/50  - OCT w/ PEDs OU (OD > OS); mild SRF overlying PED OD  - FA with mild CNVM OD  - recommend IVA #1 OD today  - pt wishes to be treated with IVA  - RBA of procedure discussed, questions answered  - informed consent obtained and signed  - see procedure note  - f/u in 4-6 wks  2. Age related macular degeneration, non-exudative, OS  - The incidence, anatomy, and pathology of dry AMD, risk of progression, and the AREDS and AREDS 2 study including smoking risks discussed with patient.  - Recommend amsler grid monitoring  4. PVD / vitreous syneresis OU  Discussed findings and prognosis  No RT or RD on 360 scleral depressed exam  Reviewed s/s of RT/RD  Strict return precautions for any such RT/RD signs/symptoms  5. Pseudophakia OU  - s/p CE/IOL OU  - beautiful surgeries, doing well  - monitor  6. PCO OU (OS > OD) - under the expert management of Dr. Kathlen Mody - okay to proceed with Yag Cap OU from retinal standpoint - discussed possibility of ARMD limiting vision / improvement of YAG cap   Ophthalmic Meds Ordered this visit:  Meds ordered this encounter  Medications  . Bevacizumab (AVASTIN) SOLN 1.25 mg       Return in  about 1 month (around 06/25/2018) for Dilated Exam, OCT, Possible Injxn.  There are no Patient Instructions on file for this visit.   Explained the diagnoses, plan, and follow up with the patient and they expressed understanding.  Patient expressed understanding of the importance of proper follow up care.   This document serves as a record of services personally performed by Tammy Sleeper, MD, PhD. It was created on their behalf by Tammy Allison, OA, an ophthalmic assistant. The creation of this record is the provider's dictation and/or activities during the visit.    Electronically signed by: Tammy Allison, OA  07.11.2019 10:39 PM   This document serves as a record of services personally performed by Tammy Sleeper, MD, PhD. It was created on their behalf by Catha Brow, [place], a certified ophthalmic assistant. The creation of this record is the provider's dictation and/or activities during the visit.  Electronically signed by: Catha Brow, COA  07.12.19 10:39 PM   Tammy Allison, M.D., Ph.D. Diseases & Surgery of the Retina and Vitreous [facility]  I have reviewed the above documentation for accuracy and completeness, and I agree with the above. Tammy Allison, M.D., Ph.D. 05/30/18 10:44 PM   Abbreviations: M myopia (nearsighted); A astigmatism; H hyperopia (farsighted); P presbyopia; Mrx spectacle prescription;  CTL contact lenses; OD right eye; OS left eye; OU  both eyes  XT exotropia; ET esotropia; PEK punctate epithelial keratitis; PEE punctate epithelial erosions; DES dry eye syndrome; MGD meibomian gland dysfunction; ATs artificial tears; PFAT's preservative free artificial tears; [place] nuclear sclerotic cataract; PSC posterior subcapsular cataract; ERM epi-retinal membrane; PVD posterior vitreous detachment; RD retinal detachment; DM diabetes mellitus; DR diabetic retinopathy; NPDR non-proliferative diabetic retinopathy; PDR proliferative diabetic  retinopathy; CSME clinically significant macular edema; DME diabetic macular edema; dbh dot blot hemorrhages; CWS cotton wool spot; POAG primary open angle glaucoma; C/D cup-to-disc ratio; HVF humphrey visual field; GVF goldmann visual field; OCT optical coherence tomography; IOP intraocular pressure; BRVO Branch retinal vein occlusion; CRVO central retinal vein occlusion; CRAO central retinal artery occlusion; BRAO branch retinal artery occlusion; RT retinal tear; SB scleral buckle; PPV pars plana vitrectomy; VH Vitreous hemorrhage; PRP panretinal laser photocoagulation; IVK intravitreal kenalog; VMT vitreomacular traction; MH Macular hole;  NVD neovascularization of the disc; NVE neovascularization elsewhere; AREDS age related eye disease study; ARMD age related macular degeneration; POAG primary open angle glaucoma; EBMD epithelial/anterior basement membrane dystrophy; ACIOL anterior chamber intraocular lens; IOL intraocular lens; PCIOL posterior chamber intraocular lens; Phaco/IOL phacoemulsification with intraocular lens placement; [place] photorefractive keratectomy; LASIK laser assisted in situ keratomileusis; HTN hypertension; DM diabetes mellitus; COPD chronic obstructive pulmonary disease

## 2018-05-28 ENCOUNTER — Ambulatory Visit (INDEPENDENT_AMBULATORY_CARE_PROVIDER_SITE_OTHER): Payer: Medicare Other | Admitting: Ophthalmology

## 2018-05-28 ENCOUNTER — Encounter (INDEPENDENT_AMBULATORY_CARE_PROVIDER_SITE_OTHER): Payer: Self-pay | Admitting: Ophthalmology

## 2018-05-28 DIAGNOSIS — H353122 Nonexudative age-related macular degeneration, left eye, intermediate dry stage: Secondary | ICD-10-CM | POA: Diagnosis not present

## 2018-05-28 DIAGNOSIS — H26493 Other secondary cataract, bilateral: Secondary | ICD-10-CM

## 2018-05-28 DIAGNOSIS — H43813 Vitreous degeneration, bilateral: Secondary | ICD-10-CM

## 2018-05-28 DIAGNOSIS — Z961 Presence of intraocular lens: Secondary | ICD-10-CM

## 2018-05-28 DIAGNOSIS — H353211 Exudative age-related macular degeneration, right eye, with active choroidal neovascularization: Secondary | ICD-10-CM

## 2018-05-28 DIAGNOSIS — H3581 Retinal edema: Secondary | ICD-10-CM

## 2018-05-28 MED ORDER — BEVACIZUMAB CHEMO INJECTION 1.25MG/0.05ML SYRINGE FOR KALEIDOSCOPE
1.2500 mg | INTRAVITREAL | Status: DC
  Administered 2018-05-28: 1.25 mg via INTRAVITREAL

## 2018-05-30 ENCOUNTER — Encounter (INDEPENDENT_AMBULATORY_CARE_PROVIDER_SITE_OTHER): Payer: Self-pay | Admitting: Ophthalmology

## 2018-06-02 DIAGNOSIS — Z6825 Body mass index (BMI) 25.0-25.9, adult: Secondary | ICD-10-CM | POA: Diagnosis not present

## 2018-06-02 DIAGNOSIS — I10 Essential (primary) hypertension: Secondary | ICD-10-CM | POA: Diagnosis not present

## 2018-06-25 NOTE — Progress Notes (Signed)
[facility] Note  06/28/2018     CHIEF COMPLAINT Patient presents for Retina Follow Up   HISTORY OF PRESENT ILLNESS: Tammy Allison is a 83 y.o. female who presents to the clinic today for:   HPI    Retina Follow Up    Patient presents with  Wet AMD.  In right eye.  Severity is moderate.  Duration of 1 month.  Since onset it is stable.  I, the attending physician,  performed the HPI with the patient and updated documentation appropriately.          Comments    Pt presnts for exu ARMD OD f/u, pt states VA has been "okay", but states that it is not as good as it used to be, pt states she just got back from the beach and had a hard time reading the signs at the pool, pt denies FOL, floaters, pain or wavy vision, pt denies the use of gtts, pt states she has just been put on a 3rd bp medication, which has made her bp come down, but has also drained all her energy       Last edited by Bernarda Caffey, MD on 06/28/2018  9:13 AM. (History)      Referring physician: [name], MD [place], [place] [address]  HISTORICAL INFORMATION:   Selected notes from the MEDICAL RECORD NUMBER Referred by Dr. Quentin Ore for exu ARMD OD LEE: 07.01.19 Read Drivers) [BCVA: OD: 20/40+2 OS: 20/25] Ocular Hx-non-exu ARMD OS, open angle glaucoma, pseudophakia OU, PVD, corneal guttata, miosis, asteroid hyalosis, posterior capsular opacification OU PMH-HTN, high cholesterol, thyroid disease    CURRENT MEDICATIONS: Current Outpatient Medications (Ophthalmic Drugs)  Medication Sig  . prednisoLONE acetate (PRED FORTE) 1 % ophthalmic suspension Place 1 drop into the right eye 2 (two) times daily.    No current facility-administered medications for this visit.  (Ophthalmic Drugs)   Current Outpatient Medications (Other)  Medication Sig  . cetirizine (ZYRTEC) 10 MG tablet Take 10 mg by mouth every morning.   . cholecalciferol (VITAMIN D) 1000 UNITS tablet Take  1,000 Units by mouth every morning.   . cloNIDine (CATAPRES) 0.1 MG tablet TK 1 T PO BID  . dicyclomine (BENTYL) 10 MG capsule Take 1 capsule (10 mg total) by mouth 4 (four) times daily -  before meals and at bedtime. (Patient not taking: Reported on 01/18/2015)  . docusate sodium (COLACE) 100 MG capsule Take 100 mg by mouth 2 (two) times daily as needed for mild constipation.  Marland Kitchen escitalopram (LEXAPRO) 10 MG tablet Take 10 mg by mouth daily.  . feeding supplement, RESOURCE BREEZE, (RESOURCE BREEZE) LIQD Take 1 Container by mouth 2 (two) times daily between meals. (Patient not taking: Reported on 01/18/2015)  . fentaNYL (DURAGESIC - DOSED MCG/HR) 25 MCG/HR patch Place 25 mcg onto the skin every 3 (three) days.  Marland Kitchen HYDROcodone-acetaminophen (NORCO/VICODIN) 5-325 MG per tablet Take 1 tablet by mouth every 6 (six) hours as needed for moderate pain.   Marland Kitchen levothyroxine (SYNTHROID, LEVOTHROID) 50 MCG tablet Take 1 tablet (50 mcg total) by mouth daily before breakfast.  . losartan (COZAAR) 25 MG tablet Take 25 mg by mouth daily.  . metoCLOPramide (REGLAN) 5 MG tablet Take 1 tablet (5 mg total) by mouth every 6 (six) hours as needed for nausea. (Patient not taking: Reported on 01/18/2015)  . metoprolol tartrate (LOPRESSOR) 50 MG tablet TK 1 AND 1/2 TS PO BID WF  .  omega-3 acid ethyl esters (LOVAZA) 1 G capsule Take 1 g by mouth every morning.   . pantoprazole (PROTONIX) 40 MG tablet Take 40 mg by mouth every morning.   . potassium chloride SA (K-DUR,KLOR-CON) 20 MEQ tablet Take 20 mEq by mouth 2 (two) times daily.  . simvastatin (ZOCOR) 20 MG tablet Take 20 mg by mouth at bedtime.   Current Facility-Administered Medications (Other)  Medication Route  . Bevacizumab (AVASTIN) SOLN 1.25 mg Intravitreal  . Bevacizumab (AVASTIN) SOLN 1.25 mg Intravitreal      REVIEW OF SYSTEMS: ROS    Positive for: Musculoskeletal, Cardiovascular, Eyes   Negative for: Constitutional, Gastrointestinal, Neurological, Skin,  Genitourinary, HENT, Endocrine, Respiratory, Psychiatric, Allergic/Imm, Heme/Lymph   Last edited by Debbrah Alar, COT on 06/28/2018  9:02 AM. (History)       ALLERGIES Allergies  Allergen Reactions  . Sulfa Antibiotics Hives    PAST MEDICAL HISTORY Past Medical History:  Diagnosis Date  . Arthritis   . GERD (gastroesophageal reflux disease)   . H/O hiatal hernia   . Hyperlipidemia   . Hypertension   . Hypothyroidism   . Irritable bowel syndrome    Past Surgical History:  Procedure Laterality Date  . CATARACT EXTRACTION    . EYE SURGERY Bilateral    cataract surgery w/ lens implant  . FRACTURE SURGERY     right hip  . LUMBAR LAMINECTOMY/DECOMPRESSION MICRODISCECTOMY Left 05/25/2014   Procedure: LUMBAR LAMINECTOMY/DECOMPRESSION MICRODISCECTOMY 1 LEVEL   lumbar three;  Surgeon: Hosie Spangle, MD;  Location: [facility];  Service: Neurosurgery;  Laterality: Left;  . THYROID SURGERY      FAMILY HISTORY Family History  Problem Relation Age of Onset  . Macular degeneration Mother     SOCIAL HISTORY Social History   Tobacco Use  . Smoking status: Never Smoker  . Smokeless tobacco: Never Used  Substance Use Topics  . Alcohol use: No  . Drug use: Never         OPHTHALMIC EXAM:  Base Eye Exam    Visual Acuity (Snellen - Linear)      Right Left   Dist [place] 20/50 20/50 -1   Dist ph [place] 20/40 -1 NI       Tonometry (Tonopen, 9:07 AM)      Right Left   Pressure 9 12       Pupils      Dark Light Shape React APD   Right 2 1.5 Round Brisk None   Left 2 1.5 Round Brisk None       Visual Fields (Counting fingers)      Left Right    Full Full       Neuro/Psych    Oriented x3:  Yes   Mood/Affect:  Normal       Dilation    Both eyes:  1.0% Mydriacyl, 2.5% Phenylephrine @ 9:07 AM        Slit Lamp and Fundus Exam    Slit Lamp Exam      Right Left   Lids/Lashes Dermatochalasis - upper lid Dermatochalasis - upper lid   Conjunctiva/Sclera White and  quiet White and quiet   Cornea Arcus Arcus   Anterior Chamber Deep and quiet Deep and quiet   Iris Round and dilated Round and dilated   Lens PC IOL in good position, 1+ Posterior capsular opacification PC IOL in good position, 2-3+ Posterior capsular opacification   Vitreous Vitreous syneresis, Posterior vitreous detachment Vitreous syneresis, Posterior vitreous detachment  Fundus Exam      Right Left   Disc Pink and Sharp Pink and Sharp   C/D Ratio 0.3 0.4   Macula Large central PEDs, +SRF overlying PED -- with mild improvement in overliyng SRF, pigment mottling, Drusen Blunted foveal reflex, +PED, Drusen, Retinal pigment epithelial mottling, no heme   Vessels Vascular attenuation Vascular attenuation   Periphery Attached Normal          IMAGING AND PROCEDURES  Imaging and Procedures for @TODAY @  OCT, Retina - OU - Both Eyes       Right Eye Quality was borderline. Central Foveal Thickness: 361. Progression has improved. Findings include abnormal foveal contour, no IRF, pigment epithelial detachment, retinal drusen , subretinal fluid, outer retinal atrophy (Interval improvement in SRF).   Left Eye Quality was good. Central Foveal Thickness: 282. Progression has been stable. Findings include normal foveal contour, pigment epithelial detachment, no IRF, no SRF.   Notes *Images captured and stored on drive  Diagnosis / Impression:  OD: Exudative ARMD -- mild interval improvement in SRF OS: Non-Exudative ARMD  Clinical management:  See below  Abbreviations: NFP - Normal foveal profile. CME - cystoid macular edema. PED - pigment epithelial detachment. IRF - intraretinal fluid. SRF - subretinal fluid. EZ - ellipsoid zone. ERM - epiretinal membrane. ORA - outer retinal atrophy. ORT - outer retinal tubulation. SRHM - subretinal hyper-reflective material         Intravitreal Injection, Pharmacologic Agent - OD - Right Eye       Time Out 06/28/2018. 9:39 AM. Confirmed  correct patient, procedure, site, and patient consented.   Anesthesia Topical anesthesia was used. Anesthetic medications included Lidocaine 2%, Tetracaine 0.5%.   Procedure Preparation included 5% betadine to ocular surface, eyelid speculum. A supplied needle was used.   Injection:  1.25 mg Bevacizumab 1.25mg /0.92ml   NDC: 21194-174-08, Lot: [id number] , Expiration date: 07/21/2018   Route: Intravitreal, Site: Right Eye, Waste: 0 mg  Post-op Post injection exam found visual acuity of at least counting fingers. The patient tolerated the procedure well. There were no complications. The patient received written and verbal post procedure care education.                 ASSESSMENT/PLAN:    ICD-10-CM   1. 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 Retinal edema H35.81 OCT, Retina - OU - Both Eyes  4. Posterior vitreous detachment of both eyes H43.813   5. Pseudophakia of both eyes Z96.1   6. PCO (posterior capsular opacification), bilateral H26.493     1,3. Exudative age related macular degeneration, OD   - initial BCVA OD 20/50  - initial OCT w/ PEDs OU (OD > OS); mild SRF overlying PED OD  - FA with mild CNVM OD  - S/P IVA OD #1 (07.12.19)  - today, BCVA improved to 20/40 and OCT shows interval improvement in SRF (PEDs stable)  - recommend IVA #2 OD today  - pt wishes to be treated with IVA  - RBA of procedure discussed, questions answered  - informed consent obtained and signed  - see procedure note  - f/u in 4 wks  2. Age related macular degeneration, non-exudative, OS  - The incidence, anatomy,  and pathology of dry AMD, risk of progression, and the AREDS and AREDS 2 study  including smoking risks discussed with patient  - continue amsler grid monitoring  4. PVD / vitreous syneresis OU  Discussed findings and prognosis  No RT or RD on 360 scleral depressed exam  Reviewed s/s of RT/RD  Strict return precautions for any such RT/RD signs/symptoms  5. Pseudophakia OU  - s/p CE/IOL OU  - beautiful surgeries, doing well  - monitor  6. PCO OU (OS > OD) - under the expert management of Dr. Kathlen Mody - okay to proceed with Yag Cap OU from retinal standpoint - discussed possibility of ARMD limiting vision / improvement of YAG cap - pt scheduled with Dr. Kathlen Mody next month   Ophthalmic Meds Ordered this visit:  Meds ordered this encounter  Medications  . Bevacizumab (AVASTIN) SOLN 1.25 mg       Return in about 4 weeks (around 07/26/2018) for F/U Exu AMD, DFE, OCT.  There are no Patient Instructions on file for this visit.   Explained the diagnoses, plan, and follow up with the patient and they expressed understanding.  Patient expressed understanding of the importance of proper follow up care.   This document serves as a record of services personally performed by Gardiner Sleeper, MD, PhD. It was created on their behalf by Catha Brow, [place], a certified ophthalmic assistant. The creation of this record is the provider's dictation and/or activities during the visit.  Electronically signed by: Catha Brow, COA  08.09.19 1:31 PM   Gardiner Sleeper, M.D., Ph.D. Diseases & Surgery of the Retina and Vitreous [facility]  I have reviewed the above documentation for accuracy and completeness, and I agree with the above. Gardiner Sleeper, M.D., Ph.D. 06/28/18 1:31 PM     Abbreviations: M myopia (nearsighted); A astigmatism; H hyperopia (farsighted); P presbyopia; Mrx spectacle prescription;  CTL contact lenses; OD right eye; OS left eye; OU both eyes  XT exotropia; ET esotropia; PEK punctate epithelial keratitis; PEE punctate  epithelial erosions; DES dry eye syndrome; MGD meibomian gland dysfunction; ATs artificial tears; PFAT's preservative free artificial tears; [place] nuclear sclerotic cataract; PSC posterior subcapsular cataract; ERM epi-retinal membrane; PVD posterior vitreous detachment; RD retinal detachment; DM diabetes mellitus; DR diabetic retinopathy; NPDR non-proliferative diabetic retinopathy; PDR proliferative diabetic retinopathy; CSME clinically significant macular edema; DME diabetic macular edema; dbh dot blot hemorrhages; CWS cotton wool spot; POAG primary open angle glaucoma; C/D cup-to-disc ratio; HVF humphrey visual field; GVF goldmann visual field; OCT optical coherence tomography; IOP intraocular pressure; BRVO Branch retinal vein occlusion; CRVO central retinal vein occlusion; CRAO central retinal artery occlusion; BRAO branch retinal artery occlusion; RT retinal tear; SB scleral buckle; PPV pars plana vitrectomy; VH Vitreous hemorrhage; PRP panretinal laser photocoagulation; IVK intravitreal kenalog; VMT vitreomacular traction; MH Macular hole;  NVD neovascularization of the disc; NVE neovascularization elsewhere; AREDS age related eye disease study; ARMD age related macular degeneration; POAG primary open angle glaucoma; EBMD epithelial/anterior basement membrane dystrophy; ACIOL anterior chamber intraocular lens; IOL intraocular lens; PCIOL posterior chamber intraocular lens; Phaco/IOL phacoemulsification with intraocular lens placement; [place] photorefractive keratectomy; LASIK laser assisted in situ keratomileusis; HTN hypertension; DM diabetes mellitus; COPD chronic obstructive pulmonary disease

## 2018-06-28 ENCOUNTER — Encounter (INDEPENDENT_AMBULATORY_CARE_PROVIDER_SITE_OTHER): Payer: Self-pay | Admitting: Ophthalmology

## 2018-06-28 ENCOUNTER — Ambulatory Visit (INDEPENDENT_AMBULATORY_CARE_PROVIDER_SITE_OTHER): Payer: Medicare Other | Admitting: Ophthalmology

## 2018-06-28 DIAGNOSIS — H26493 Other secondary cataract, bilateral: Secondary | ICD-10-CM

## 2018-06-28 DIAGNOSIS — Z961 Presence of intraocular lens: Secondary | ICD-10-CM

## 2018-06-28 DIAGNOSIS — H353211 Exudative age-related macular degeneration, right eye, with active choroidal neovascularization: Secondary | ICD-10-CM

## 2018-06-28 DIAGNOSIS — H353122 Nonexudative age-related macular degeneration, left eye, intermediate dry stage: Secondary | ICD-10-CM

## 2018-06-28 DIAGNOSIS — H3581 Retinal edema: Secondary | ICD-10-CM | POA: Diagnosis not present

## 2018-06-28 DIAGNOSIS — H43813 Vitreous degeneration, bilateral: Secondary | ICD-10-CM

## 2018-06-28 MED ORDER — BEVACIZUMAB CHEMO INJECTION 1.25MG/0.05ML SYRINGE FOR KALEIDOSCOPE
1.2500 mg | INTRAVITREAL | Status: DC
Start: 2018-06-28 — End: 2019-04-25
  Administered 2018-06-28: 1.25 mg via INTRAVITREAL

## 2018-07-12 DIAGNOSIS — I10 Essential (primary) hypertension: Secondary | ICD-10-CM | POA: Diagnosis not present

## 2018-07-12 DIAGNOSIS — Z6825 Body mass index (BMI) 25.0-25.9, adult: Secondary | ICD-10-CM | POA: Diagnosis not present

## 2018-07-12 DIAGNOSIS — K580 Irritable bowel syndrome with diarrhea: Secondary | ICD-10-CM | POA: Diagnosis not present

## 2018-07-23 NOTE — Progress Notes (Addendum)
[facility] Note  07/26/2018     CHIEF COMPLAINT Patient presents for Retina Follow Up   HISTORY OF PRESENT ILLNESS: Tammy Allison is a 83 y.o. female who presents to the clinic today for:   HPI    Retina Follow Up    Patient presents with  Wet AMD.  In right eye.  This started 1 month ago.  Severity is mild.  Since onset it is stable.  I, the attending physician,  performed the HPI with the patient and updated documentation appropriately.          Comments    F/U EXU AMD OD. Patient states her vision is about the same ," film covering my eyes needs to be removed,so I can see better".Patient reports she is going to have film ou removed soon. Pt is ready for tx today if indicted.       Last edited by Bernarda Caffey, MD on 07/26/2018  9:42 AM. (History)      Referring physician: [name], MD [place], [place] [address]  HISTORICAL INFORMATION:   Selected notes from the MEDICAL RECORD NUMBER Referred by Dr. Quentin Ore for exu ARMD OD LEE: 07.01.19 Read Drivers) [BCVA: OD: 20/40+2 OS: 20/25] Ocular Hx-non-exu ARMD OS, open angle glaucoma, pseudophakia OU, PVD, corneal guttata, miosis, asteroid hyalosis, posterior capsular opacification OU PMH-HTN, high cholesterol, thyroid disease    CURRENT MEDICATIONS: Current Outpatient Medications (Ophthalmic Drugs)  Medication Sig  . prednisoLONE acetate (PRED FORTE) 1 % ophthalmic suspension Place 1 drop into the right eye 2 (two) times daily.    No current facility-administered medications for this visit.  (Ophthalmic Drugs)   Current Outpatient Medications (Other)  Medication Sig  . cetirizine (ZYRTEC) 10 MG tablet Take 10 mg by mouth every morning.   . cholecalciferol (VITAMIN D) 1000 UNITS tablet Take 1,000 Units by mouth every morning.   . cloNIDine (CATAPRES) 0.1 MG tablet TK 1 T PO BID  . dicyclomine (BENTYL) 10 MG capsule Take 1 capsule (10 mg total) by mouth 4 (four) times  daily -  before meals and at bedtime.  . docusate sodium (COLACE) 100 MG capsule Take 100 mg by mouth 2 (two) times daily as needed for mild constipation.  Marland Kitchen escitalopram (LEXAPRO) 10 MG tablet Take 10 mg by mouth daily.  . feeding supplement, RESOURCE BREEZE, (RESOURCE BREEZE) LIQD Take 1 Container by mouth 2 (two) times daily between meals.  Marland Kitchen levothyroxine (SYNTHROID, LEVOTHROID) 50 MCG tablet Take 1 tablet (50 mcg total) by mouth daily before breakfast.  . losartan (COZAAR) 25 MG tablet Take 25 mg by mouth daily.  . metoCLOPramide (REGLAN) 5 MG tablet Take 1 tablet (5 mg total) by mouth every 6 (six) hours as needed for nausea.  . metoprolol tartrate (LOPRESSOR) 50 MG tablet TK 1 AND 1/2 TS PO BID WF  . omega-3 acid ethyl esters (LOVAZA) 1 G capsule Take 1 g by mouth every morning.   . pantoprazole (PROTONIX) 40 MG tablet Take 40 mg by mouth every morning.   . potassium chloride SA (K-DUR,KLOR-CON) 20 MEQ tablet Take 20 mEq by mouth 2 (two) times daily.  . simvastatin (ZOCOR) 20 MG tablet Take 20 mg by mouth at bedtime.  . fentaNYL (DURAGESIC - DOSED MCG/HR) 25 MCG/HR patch Place 25 mcg onto the skin every 3 (three) days.  Marland Kitchen HYDROcodone-acetaminophen (NORCO/VICODIN) 5-325 MG per tablet Take 1 tablet by mouth every 6 (six) hours as needed for  moderate pain.    Current Facility-Administered Medications (Other)  Medication Route  . Bevacizumab (AVASTIN) SOLN 1.25 mg Intravitreal  . Bevacizumab (AVASTIN) SOLN 1.25 mg Intravitreal  . Bevacizumab (AVASTIN) SOLN 1.25 mg Intravitreal      REVIEW OF SYSTEMS: ROS    Positive for: Eyes   Negative for: Constitutional, Gastrointestinal, Neurological, Skin, Genitourinary, Musculoskeletal, HENT, Endocrine, Cardiovascular, Respiratory, Psychiatric, Allergic/Imm, Heme/Lymph   Last edited by Zenovia Jordan, LPN on 12/24/3783  8:85 AM. (History)       ALLERGIES Allergies  Allergen Reactions  . Sulfa Antibiotics Hives    PAST MEDICAL  HISTORY Past Medical History:  Diagnosis Date  . Arthritis   . GERD (gastroesophageal reflux disease)   . H/O hiatal hernia   . Hyperlipidemia   . Hypertension   . Hypothyroidism   . Irritable bowel syndrome    Past Surgical History:  Procedure Laterality Date  . CATARACT EXTRACTION    . EYE SURGERY Bilateral    cataract surgery w/ lens implant  . FRACTURE SURGERY     right hip  . LUMBAR LAMINECTOMY/DECOMPRESSION MICRODISCECTOMY Left 05/25/2014   Procedure: LUMBAR LAMINECTOMY/DECOMPRESSION MICRODISCECTOMY 1 LEVEL   lumbar three;  Surgeon: Hosie Spangle, MD;  Location: [facility];  Service: Neurosurgery;  Laterality: Left;  . THYROID SURGERY      FAMILY HISTORY Family History  Problem Relation Age of Onset  . Macular degeneration Mother     SOCIAL HISTORY Social History   Tobacco Use  . Smoking status: Never Smoker  . Smokeless tobacco: Never Used  Substance Use Topics  . Alcohol use: No  . Drug use: Never         OPHTHALMIC EXAM:  Base Eye Exam    Visual Acuity (Snellen - Linear)      Right Left   Dist cc 20/60 +2 20/40   Dist ph cc 20/50 NI   Correction:  Glasses       Tonometry (Tonopen, 9:26 AM)      Right Left   Pressure 15 17       Pupils      Dark Light Shape React APD   Right 3 2 Round Brisk None   Left 3 2 Round Brisk None       Visual Fields (Counting fingers)      Left Right    Full Full       Extraocular Movement      Right Left    Full, Ortho Full, Ortho       Neuro/Psych    Oriented x3:  Yes   Mood/Affect:  Normal       Dilation    Both eyes:  1.0% Mydriacyl, 2.5% Phenylephrine @ 9:26 AM        Slit Lamp and Fundus Exam    Slit Lamp Exam      Right Left   Lids/Lashes Dermatochalasis - upper lid Dermatochalasis - upper lid   Conjunctiva/Sclera White and quiet White and quiet   Cornea Arcus Arcus   Anterior Chamber Deep and quiet Deep and quiet   Iris Round and dilated Round and dilated   Lens PC IOL in good  position, 1+ Posterior capsular opacification PC IOL in good position, 2-3+ Posterior capsular opacification   Vitreous Vitreous syneresis, Posterior vitreous detachment Vitreous syneresis, Posterior vitreous detachment       Fundus Exam      Right Left   Disc Pink and Sharp Pink and Sharp   C/D Ratio  0.3 0.4   Macula Large central PEDs, +SRF overlying PED -- with mild worsening in overliyng SRF, pigment mottling, Drusen Blunted foveal reflex, +PED, Drusen, Retinal pigment epithelial mottling, no heme   Vessels Vascular attenuation Vascular attenuation   Periphery Attached Normal          IMAGING AND PROCEDURES  Imaging and Procedures for @TODAY @  OCT, Retina - OU - Both Eyes       Right Eye Quality was borderline. Central Foveal Thickness: 363. Progression has worsened. Findings include abnormal foveal contour, no IRF, pigment epithelial detachment, retinal drusen , subretinal fluid, outer retinal atrophy (Mild interval worsening in SRF).   Left Eye Quality was good. Central Foveal Thickness: 282. Progression has been stable. Findings include normal foveal contour, pigment epithelial detachment, no IRF, no SRF.   Notes *Images captured and stored on drive  Diagnosis / Impression:  OD: Exudative ARMD -- mild interval worsening in SRF overlying PED OS: Non-Exudative ARMD w/ stable PEDs  Clinical management:  See below  Abbreviations: NFP - Normal foveal profile. CME - cystoid macular edema. PED - pigment epithelial detachment. IRF - intraretinal fluid. SRF - subretinal fluid. EZ - ellipsoid zone. ERM - epiretinal membrane. ORA - outer retinal atrophy. ORT - outer retinal tubulation. SRHM - subretinal hyper-reflective material         Intravitreal Injection, Pharmacologic Agent - OD - Right Eye       Time Out 07/26/2018. 9:54 AM. Confirmed correct patient, procedure, site, and patient consented.   Anesthesia Topical anesthesia was used. Anesthetic medications included  Lidocaine 2%, Tetracaine 0.5%.   Procedure Preparation included 5% betadine to ocular surface, eyelid speculum. A 30 gauge needle was used.   Injection:  1.25 mg Bevacizumab 1.25mg /0.16ml   NDC: 50242-060-01, Lot: [id number] , Expiration date: 08/19/2018   Route: Intravitreal, Site: Right Eye, Waste: 0 mg  Post-op Post injection exam found visual acuity of at least counting fingers. The patient tolerated the procedure well. There were no complications. The patient received written and verbal post procedure care education.                 ASSESSMENT/PLAN:    ICD-10-CM   1. 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 Retinal edema H35.81 OCT, Retina - OU - Both Eyes  4. Posterior vitreous detachment of both eyes H43.813   5. Pseudophakia of both eyes Z96.1   6. PCO (posterior capsular opacification), bilateral H26.493     1,3. Exudative age related macular degeneration, OD   - initial BCVA OD 20/50  - initial OCT w/ PEDs OU (OD > OS); mild SRF overlying PED OD  - FA with mild CNVM OD  - S/P IVA OD #1 (07.12.19), #2 (08.12.19)  - today, BCVA stable at 20/50 from 20/40 and OCT shows interval worsening in SRF (PEDs stable)  - recommend IVA #3 OD today (9.9.19)  - pt wishes to be treated with IVA  - RBA of procedure discussed, questions answered  - informed consent obtained and signed  - see procedure note  - discussed possibility of switching therapies if SRF persists on IVA  - [place] paperwork signed and benefits investigation started on 09.09.19  - f/u in 4 wks  2. Age related macular degeneration, non-exudative, OS  - The incidence, anatomy, and pathology of dry AMD, risk of  progression, and the AREDS and AREDS 2 study including smoking risks discussed with patient  - continue amsler grid monitoring  4. PVD / vitreous syneresis OU  Discussed findings and prognosis  No RT or RD on 360 scleral depressed exam  Reviewed s/s of RT/RD  Strict return precautions for any such RT/RD signs/symptoms  5. Pseudophakia OU  - s/p CE/IOL OU  - beautiful surgeries, doing well  - monitor  6. PCO OU (OS > OD) - under the expert management of Dr. Kathlen Mody - okay to proceed with Yag Cap OU from retinal standpoint - discussed possibility of ARMD limiting vision / improvement of YAG cap   Ophthalmic Meds Ordered this visit:  Meds ordered this encounter  Medications  . Bevacizumab (AVASTIN) SOLN 1.25 mg       Return in about 4 weeks (around 08/23/2018) for F/U Exu AMD OD, DFE, OCT.  There are no Patient Instructions on file for this visit.   Explained the diagnoses, plan, and follow up with the patient and they expressed understanding.  Patient expressed understanding of the importance of proper follow up care.   This document serves as a record of services personally performed by Gardiner Sleeper, MD, PhD. It was created on their behalf by Ernest Mallick, OA, an ophthalmic assistant. The creation of this record is the provider's dictation and/or activities during the visit.    Electronically signed by: Ernest Mallick, OA  09.06.2019 10:04 AM   This document serves as a record of services personally performed by Gardiner Sleeper, MD, PhD. It was created on their behalf by Catha Brow, [place], a certified ophthalmic assistant. The creation of this record is the provider's dictation and/or activities during the visit.  Electronically signed by: Catha Brow, COA  09.09.19 10:04 AM   Gardiner Sleeper, M.D., Ph.D. Diseases & Surgery of the Retina and Vitreous [facility]   I have reviewed the above documentation for accuracy and completeness, and I  agree with the above. Gardiner Sleeper, M.D., Ph.D. 07/26/18 10:04 AM    Abbreviations: M myopia (nearsighted); A astigmatism; H hyperopia (farsighted); P presbyopia; Mrx spectacle prescription;  CTL contact lenses; OD right eye; OS left eye; OU both eyes  XT exotropia; ET esotropia; PEK punctate epithelial keratitis; PEE punctate epithelial erosions; DES dry eye syndrome; MGD meibomian gland dysfunction; ATs artificial tears; PFAT's preservative free artificial tears; [place] nuclear sclerotic cataract; PSC posterior subcapsular cataract; ERM epi-retinal membrane; PVD posterior vitreous detachment; RD retinal detachment; DM diabetes mellitus; DR diabetic retinopathy; NPDR non-proliferative diabetic retinopathy; PDR proliferative diabetic retinopathy; CSME clinically significant macular edema; DME diabetic macular edema; dbh dot blot hemorrhages; CWS cotton wool spot; POAG primary open angle glaucoma; C/D cup-to-disc ratio; HVF humphrey visual field; GVF goldmann visual field; OCT optical coherence tomography; IOP intraocular pressure; BRVO Branch retinal vein occlusion; CRVO central retinal vein occlusion; CRAO central retinal artery occlusion; BRAO branch retinal artery occlusion; RT retinal tear; SB scleral buckle; PPV pars plana vitrectomy; VH Vitreous hemorrhage; PRP panretinal laser photocoagulation; IVK intravitreal kenalog; VMT vitreomacular traction; MH Macular hole;  NVD neovascularization of the disc; NVE neovascularization elsewhere; AREDS age related eye disease study; ARMD age related macular degeneration; POAG primary open angle glaucoma; EBMD epithelial/anterior basement membrane dystrophy; ACIOL anterior chamber intraocular lens; IOL intraocular lens; PCIOL posterior chamber intraocular lens; Phaco/IOL phacoemulsification with intraocular lens  placement; [place] photorefractive keratectomy; LASIK laser assisted in situ keratomileusis; HTN hypertension; DM diabetes mellitus; COPD chronic obstructive  pulmonary disease

## 2018-07-26 ENCOUNTER — Encounter (INDEPENDENT_AMBULATORY_CARE_PROVIDER_SITE_OTHER): Payer: Self-pay | Admitting: Ophthalmology

## 2018-07-26 ENCOUNTER — Ambulatory Visit (INDEPENDENT_AMBULATORY_CARE_PROVIDER_SITE_OTHER): Payer: Medicare Other | Admitting: Ophthalmology

## 2018-07-26 DIAGNOSIS — H353122 Nonexudative age-related macular degeneration, left eye, intermediate dry stage: Secondary | ICD-10-CM | POA: Diagnosis not present

## 2018-07-26 DIAGNOSIS — H26493 Other secondary cataract, bilateral: Secondary | ICD-10-CM | POA: Diagnosis not present

## 2018-07-26 DIAGNOSIS — H43813 Vitreous degeneration, bilateral: Secondary | ICD-10-CM

## 2018-07-26 DIAGNOSIS — H3581 Retinal edema: Secondary | ICD-10-CM

## 2018-07-26 DIAGNOSIS — H353211 Exudative age-related macular degeneration, right eye, with active choroidal neovascularization: Secondary | ICD-10-CM

## 2018-07-26 DIAGNOSIS — Z961 Presence of intraocular lens: Secondary | ICD-10-CM

## 2018-07-26 MED ORDER — BEVACIZUMAB CHEMO INJECTION 1.25MG/0.05ML SYRINGE FOR KALEIDOSCOPE
1.2500 mg | INTRAVITREAL | Status: DC
  Administered 2018-07-26: 1.25 mg via INTRAVITREAL

## 2018-08-11 DIAGNOSIS — R351 Nocturia: Secondary | ICD-10-CM | POA: Diagnosis not present

## 2018-08-11 DIAGNOSIS — M859 Disorder of bone density and structure, unspecified: Secondary | ICD-10-CM | POA: Diagnosis not present

## 2018-08-11 DIAGNOSIS — R5383 Other fatigue: Secondary | ICD-10-CM | POA: Diagnosis not present

## 2018-08-11 DIAGNOSIS — I10 Essential (primary) hypertension: Secondary | ICD-10-CM | POA: Diagnosis not present

## 2018-08-11 DIAGNOSIS — R159 Full incontinence of feces: Secondary | ICD-10-CM | POA: Diagnosis not present

## 2018-08-20 NOTE — Progress Notes (Addendum)
[facility] Note  08/23/2018     CHIEF COMPLAINT Patient presents for Retina Follow Up   HISTORY OF PRESENT ILLNESS: Tammy Allison is a 83 y.o. female who presents to the clinic today for:   HPI    Retina Follow Up    Patient presents with  Wet AMD.  In right eye.  Severity is moderate.  Duration of 4 weeks.  Since onset it is stable.  I, the attending physician,  performed the HPI with the patient and updated documentation appropriately.          Comments    Pt presents for exu ARMD OD f/u, pt states VA is "okay, but not great", pt states she has had a few floaters, but denies FOL and pain, pt denies the use of gtts, pt states she has been tolerating injxns well       Last edited by Bernarda Caffey, MD on 08/23/2018 10:08 AM. (History)      Referring physician: [name], MD [place], [place] [id number]  HISTORICAL INFORMATION:   Selected notes from the MEDICAL RECORD NUMBER Referred by Dr. Quentin Ore for exu ARMD OD LEE: 07.01.19 Read Drivers) [BCVA: OD: 20/40+2 OS: 20/25] Ocular Hx-non-exu ARMD OS, open angle glaucoma, pseudophakia OU, PVD, corneal guttata, miosis, asteroid hyalosis, posterior capsular opacification OU PMH-HTN, high cholesterol, thyroid disease    CURRENT MEDICATIONS: Current Outpatient Medications (Ophthalmic Drugs)  Medication Sig  . prednisoLONE acetate (PRED FORTE) 1 % ophthalmic suspension Place 1 drop into the right eye 2 (two) times daily.    Current Facility-Administered Medications (Ophthalmic Drugs)  Medication Route  . aflibercept (EYLEA) SOLN 2 mg Intravitreal   Current Outpatient Medications (Other)  Medication Sig  . cetirizine (ZYRTEC) 10 MG tablet Take 10 mg by mouth every morning.   . cholecalciferol (VITAMIN D) 1000 UNITS tablet Take 1,000 Units by mouth every morning.   . cholestyramine (QUESTRAN) 4 g packet MIX 1 PACKET WITH WATER OR NON-CARBONATED DRINK AND DRINK ONCE D  .  cloNIDine (CATAPRES) 0.1 MG tablet TK 1 T PO BID  . cloNIDine (CATAPRES) 0.2 MG tablet   . dicyclomine (BENTYL) 10 MG capsule Take 1 capsule (10 mg total) by mouth 4 (four) times daily -  before meals and at bedtime.  . docusate sodium (COLACE) 100 MG capsule Take 100 mg by mouth 2 (two) times daily as needed for mild constipation.  Marland Kitchen escitalopram (LEXAPRO) 10 MG tablet Take 10 mg by mouth daily.  . feeding supplement, RESOURCE BREEZE, (RESOURCE BREEZE) LIQD Take 1 Container by mouth 2 (two) times daily between meals.  . fentaNYL (DURAGESIC - DOSED MCG/HR) 25 MCG/HR patch Place 25 mcg onto the skin every 3 (three) days.  Marland Kitchen HYDROcodone-acetaminophen (NORCO/VICODIN) 5-325 MG per tablet Take 1 tablet by mouth every 6 (six) hours as needed for moderate pain.   Marland Kitchen levothyroxine (SYNTHROID, LEVOTHROID) 50 MCG tablet Take 1 tablet (50 mcg total) by mouth daily before breakfast.  . losartan (COZAAR) 25 MG tablet Take 25 mg by mouth daily.  . metoCLOPramide (REGLAN) 5 MG tablet Take 1 tablet (5 mg total) by mouth every 6 (six) hours as needed for nausea.  . metoprolol tartrate (LOPRESSOR) 50 MG tablet TK 1 AND 1/2 TS PO BID WF  . omega-3 acid ethyl esters (LOVAZA) 1 G capsule Take 1 g by mouth every morning.   . pantoprazole (PROTONIX) 40 MG tablet Take 40 mg by mouth every morning.   Marland Kitchen  potassium chloride SA (K-DUR,KLOR-CON) 20 MEQ tablet Take 20 mEq by mouth 2 (two) times daily.  . simvastatin (ZOCOR) 20 MG tablet Take 20 mg by mouth at bedtime.   Current Facility-Administered Medications (Other)  Medication Route  . Bevacizumab (AVASTIN) SOLN 1.25 mg Intravitreal  . Bevacizumab (AVASTIN) SOLN 1.25 mg Intravitreal  . Bevacizumab (AVASTIN) SOLN 1.25 mg Intravitreal      REVIEW OF SYSTEMS: ROS    Positive for: Musculoskeletal, Endocrine, Cardiovascular, Eyes   Negative for: Constitutional, Gastrointestinal, Neurological, Skin, Genitourinary, HENT, Respiratory, Psychiatric, Allergic/Imm, Heme/Lymph    Last edited by Debbrah Alar, COT on 08/23/2018  9:01 AM. (History)       ALLERGIES Allergies  Allergen Reactions  . Sulfa Antibiotics Hives    PAST MEDICAL HISTORY Past Medical History:  Diagnosis Date  . Arthritis   . GERD (gastroesophageal reflux disease)   . H/O hiatal hernia   . Hyperlipidemia   . Hypertension   . Hypothyroidism   . Irritable bowel syndrome    Past Surgical History:  Procedure Laterality Date  . CATARACT EXTRACTION    . EYE SURGERY Bilateral    cataract surgery w/ lens implant  . FRACTURE SURGERY     right hip  . LUMBAR LAMINECTOMY/DECOMPRESSION MICRODISCECTOMY Left 05/25/2014   Procedure: LUMBAR LAMINECTOMY/DECOMPRESSION MICRODISCECTOMY 1 LEVEL   lumbar three;  Surgeon: Hosie Spangle, MD;  Location: [facility];  Service: Neurosurgery;  Laterality: Left;  . THYROID SURGERY      FAMILY HISTORY Family History  Problem Relation Age of Onset  . Macular degeneration Mother     SOCIAL HISTORY Social History   Tobacco Use  . Smoking status: Never Smoker  . Smokeless tobacco: Never Used  Substance Use Topics  . Alcohol use: No  . Drug use: Never         OPHTHALMIC EXAM:  Base Eye Exam    Visual Acuity (Snellen - Linear)      Right Left   Dist cc 20/50 -2 20/40 -2   Dist ph cc 20/50 NI       Tonometry (Tonopen, 9:07 AM)      Right Left   Pressure 11 12       Pupils      Dark Light Shape React APD   Right 2 1 Round Slow None   Left 2 1 Round Slow None       Visual Fields (Counting fingers)      Left Right    Full Full       Extraocular Movement      Right Left    Full, Ortho Full, Ortho       Neuro/Psych    Oriented x3:  Yes   Mood/Affect:  Normal       Dilation    Both eyes:  1.0% Mydriacyl, 2.5% Phenylephrine @ 9:07 AM        Slit Lamp and Fundus Exam    Slit Lamp Exam      Right Left   Lids/Lashes Dermatochalasis - upper lid Dermatochalasis - upper lid   Conjunctiva/Sclera White and quiet White  and quiet   Cornea Arcus Arcus   Anterior Chamber Deep and quiet Deep and quiet   Iris Round and dilated Round and dilated   Lens PC IOL in good position, 1+ Posterior capsular opacification PC IOL in good position, 2-3+ Posterior capsular opacification   Vitreous Vitreous syneresis, Posterior vitreous detachment Vitreous syneresis, Posterior vitreous detachment  Fundus Exam      Right Left   Disc Pink and Sharp Pink and Sharp   C/D Ratio 0.3 0.4   Macula Large central PEDs with stable SRF overlying, pigment mottling, Drusen Blunted foveal reflex, +PED, Drusen, Retinal pigment epithelial mottling, no heme   Vessels Vascular attenuation Vascular attenuation   Periphery Attached Normal          IMAGING AND PROCEDURES  Imaging and Procedures for @TODAY @  OCT, Retina - OU - Both Eyes       Right Eye Quality was borderline. Central Foveal Thickness: 368. Progression has been stable. Findings include abnormal foveal contour, no IRF, pigment epithelial detachment, retinal drusen , subretinal fluid, outer retinal atrophy (Mild interval worsening in SRF).   Left Eye Quality was good. Central Foveal Thickness: 291. Progression has been stable. Findings include normal foveal contour, pigment epithelial detachment, no IRF, no SRF.   Notes *Images captured and stored on drive  Diagnosis / Impression:  OD: Exudative ARMD -- stable SRF overlying PED OS: Non-Exudative ARMD w/ stable PEDs  Clinical management:  See below  Abbreviations: NFP - Normal foveal profile. CME - cystoid macular edema. PED - pigment epithelial detachment. IRF - intraretinal fluid. SRF - subretinal fluid. EZ - ellipsoid zone. ERM - epiretinal membrane. ORA - outer retinal atrophy. ORT - outer retinal tubulation. SRHM - subretinal hyper-reflective material         Intravitreal Injection, Pharmacologic Agent - OD - Right Eye       Time Out 08/23/2018. 10:08 AM. Confirmed correct patient, procedure, site,  and patient consented.   Anesthesia Topical anesthesia was used. Anesthetic medications included Lidocaine 2%, Proparacaine 0.5%.   Procedure Preparation included 5% betadine to ocular surface, eyelid speculum. A 30 gauge needle was used.   Injection:  2 mg aflibercept 2 MG/0.05ML   NDC: 61755-005-02, Lot: [id number], Expiration date: 07/17/2019   Route: Intravitreal, Site: Right Eye, Waste: 0.05 mL  Post-op Post injection exam found visual acuity of at least counting fingers. The patient tolerated the procedure well. There were no complications. The patient received written and verbal post procedure care education.                 ASSESSMENT/PLAN:    ICD-10-CM   1. Exudative age-related macular degeneration of right eye with active choroidal neovascularization (HCC) H35.3211 OCT, Retina - OU - Both Eyes    Intravitreal Injection, Pharmacologic Agent - OD - Right Eye    aflibercept (EYLEA) SOLN 2 mg  2. 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 Retinal edema H35.81 OCT, Retina - OU - Both Eyes  4. Posterior vitreous detachment of both eyes H43.813   5. Pseudophakia of both eyes Z96.1   6. PCO (posterior capsular opacification), bilateral H26.493     1,3. Exudative age related macular degeneration, OD   - initial BCVA OD 20/50  - initial OCT w/ PEDs OU (OD > OS); mild SRF overlying PED OD  - FA with mild CNVM OD  - S/P IVA OD #1 (07.12.19), #2 (08.12.19), #3 (09.09.19)  - today, BCVA stable at 20/50 from 20/40 and OCT shows no interval improvement in SRF x 3 IVA (PEDs stable)  - discussed switching therapies at last visit if no improvement in SRF  - recommend switching to [facility] today  - recommend IVE #1 OD today (10.07.19)  - pt wishes to be treated with IVE   - RBA of procedure discussed, questions  answered  - informed consent obtained and signed  - see procedure note  - Eylea paperwork signed and benefits investigation  started on 09.09.19 -- approved for Good Days  - f/u in 4 weeks  2. Age related macular degeneration, non-exudative, OS  - The incidence, anatomy, and pathology of dry AMD, risk of progression, and the AREDS and AREDS 2 study including smoking risks discussed with patient  - continue amsler grid monitoring  4. PVD / vitreous syneresis OU  Discussed findings and prognosis  No RT or RD on 360 scleral depressed exam  Reviewed s/s of RT/RD  Strict return precautions for any such RT/RD sings/symptoms  5. Pseudophakia OU  - s/p CE/IOL OU  - beautiful surgeries, doing well  - monitor  6. PCO OU (OS > OD) - under the expert management of Dr. Kathlen Mody - okay to proceed with Yag Cap OU from retinal standpoint - discussed possibility of ARMD limiting vision / improvement of YAG cap   Ophthalmic Meds Ordered this visit:  Meds ordered this encounter  Medications  . aflibercept (EYLEA) SOLN 2 mg       Return in about 4 weeks (around 09/20/2018) for F/U Exu AMD OD, DFE, OCT.  There are no Patient Instructions on file for this visit.   Explained the diagnoses, plan, and follow up with the patient and they expressed understanding.  Patient expressed understanding of the importance of proper follow up care.   This document serves as a record of services personally performed by Gardiner Sleeper, MD, PhD. It was created on their behalf by Catha Brow, [place], a certified ophthalmic assistant. The creation of this record is the provider's dictation and/or activities during the visit.  Electronically signed by: Catha Brow, COA  10.04.19 8:13 AM    Gardiner Sleeper, M.D., Ph.D. Diseases & Surgery of the Retina and Vitreous [facility]  I have reviewed the above documentation for accuracy and completeness, and I agree with the above. Gardiner Sleeper, M.D., Ph.D. 08/24/18 8:13 AM    Abbreviations: M myopia (nearsighted); A astigmatism; H hyperopia (farsighted); P  presbyopia; Mrx spectacle prescription;  CTL contact lenses; OD right eye; OS left eye; OU both eyes  XT exotropia; ET esotropia; PEK punctate epithelial keratitis; PEE punctate epithelial erosions; DES dry eye syndrome; MGD meibomian gland dysfunction; ATs artificial tears; PFAT's preservative free artificial tears; [place] nuclear sclerotic cataract; PSC posterior subcapsular cataract; ERM epi-retinal membrane; PVD posterior vitreous detachment; RD retinal detachment; DM diabetes mellitus; DR diabetic retinopathy; NPDR non-proliferative diabetic retinopathy; PDR proliferative diabetic retinopathy; CSME clinically significant macular edema; DME diabetic macular edema; dbh dot blot hemorrhages; CWS cotton wool spot; POAG primary open angle glaucoma; C/D cup-to-disc ratio; HVF humphrey visual field; GVF goldmann visual field; OCT optical coherence tomography; IOP intraocular pressure; BRVO Branch retinal vein occlusion; CRVO central retinal vein occlusion; CRAO central retinal artery occlusion; BRAO branch retinal artery occlusion; RT retinal tear; SB scleral buckle; PPV pars plana vitrectomy; VH Vitreous hemorrhage; PRP panretinal laser photocoagulation; IVK intravitreal kenalog; VMT vitreomacular traction; MH Macular hole;  NVD neovascularization of the disc; NVE neovascularization elsewhere; AREDS age related eye disease study; ARMD age related macular degeneration; POAG primary open angle glaucoma; EBMD epithelial/anterior basement membrane dystrophy; ACIOL anterior chamber intraocular lens; IOL intraocular lens; PCIOL posterior chamber intraocular lens; Phaco/IOL phacoemulsification with intraocular lens placement; [place] photorefractive keratectomy; LASIK laser assisted in situ keratomileusis; HTN hypertension; DM diabetes mellitus; COPD chronic obstructive pulmonary disease

## 2018-08-23 ENCOUNTER — Encounter (INDEPENDENT_AMBULATORY_CARE_PROVIDER_SITE_OTHER): Payer: Self-pay | Admitting: Ophthalmology

## 2018-08-23 ENCOUNTER — Ambulatory Visit (INDEPENDENT_AMBULATORY_CARE_PROVIDER_SITE_OTHER): Payer: Medicare Other | Admitting: Ophthalmology

## 2018-08-23 DIAGNOSIS — H3581 Retinal edema: Secondary | ICD-10-CM | POA: Diagnosis not present

## 2018-08-23 DIAGNOSIS — H353211 Exudative age-related macular degeneration, right eye, with active choroidal neovascularization: Secondary | ICD-10-CM | POA: Diagnosis not present

## 2018-08-23 DIAGNOSIS — H43813 Vitreous degeneration, bilateral: Secondary | ICD-10-CM

## 2018-08-23 DIAGNOSIS — Z961 Presence of intraocular lens: Secondary | ICD-10-CM | POA: Diagnosis not present

## 2018-08-23 DIAGNOSIS — H26493 Other secondary cataract, bilateral: Secondary | ICD-10-CM

## 2018-08-23 DIAGNOSIS — H353122 Nonexudative age-related macular degeneration, left eye, intermediate dry stage: Secondary | ICD-10-CM

## 2018-08-24 ENCOUNTER — Encounter (INDEPENDENT_AMBULATORY_CARE_PROVIDER_SITE_OTHER): Payer: Self-pay | Admitting: Ophthalmology

## 2018-08-24 DIAGNOSIS — H26493 Other secondary cataract, bilateral: Secondary | ICD-10-CM | POA: Diagnosis not present

## 2018-08-24 DIAGNOSIS — H353122 Nonexudative age-related macular degeneration, left eye, intermediate dry stage: Secondary | ICD-10-CM | POA: Diagnosis not present

## 2018-08-24 DIAGNOSIS — H43813 Vitreous degeneration, bilateral: Secondary | ICD-10-CM | POA: Diagnosis not present

## 2018-08-24 DIAGNOSIS — H353211 Exudative age-related macular degeneration, right eye, with active choroidal neovascularization: Secondary | ICD-10-CM | POA: Diagnosis not present

## 2018-08-24 DIAGNOSIS — H3581 Retinal edema: Secondary | ICD-10-CM | POA: Diagnosis not present

## 2018-08-24 DIAGNOSIS — Z961 Presence of intraocular lens: Secondary | ICD-10-CM | POA: Diagnosis not present

## 2018-08-24 MED ORDER — AFLIBERCEPT 2MG/0.05ML IZ SOLN FOR KALEIDOSCOPE
2.0000 mg | INTRAVITREAL | Status: DC
  Administered 2018-08-24: 2 mg via INTRAVITREAL

## 2018-09-10 DIAGNOSIS — R5383 Other fatigue: Secondary | ICD-10-CM | POA: Diagnosis not present

## 2018-09-10 DIAGNOSIS — E039 Hypothyroidism, unspecified: Secondary | ICD-10-CM | POA: Diagnosis not present

## 2018-09-10 DIAGNOSIS — Z6825 Body mass index (BMI) 25.0-25.9, adult: Secondary | ICD-10-CM | POA: Diagnosis not present

## 2018-09-10 DIAGNOSIS — I10 Essential (primary) hypertension: Secondary | ICD-10-CM | POA: Diagnosis not present

## 2018-09-15 ENCOUNTER — Encounter: Payer: Self-pay | Admitting: Interventional Cardiology

## 2018-09-15 ENCOUNTER — Ambulatory Visit (INDEPENDENT_AMBULATORY_CARE_PROVIDER_SITE_OTHER): Payer: Medicare Other | Admitting: Interventional Cardiology

## 2018-09-15 VITALS — BP 182/84 | HR 64 | Ht 64.5 in | Wt 146.8 lb

## 2018-09-15 DIAGNOSIS — R5383 Other fatigue: Secondary | ICD-10-CM | POA: Diagnosis not present

## 2018-09-15 DIAGNOSIS — E785 Hyperlipidemia, unspecified: Secondary | ICD-10-CM | POA: Diagnosis not present

## 2018-09-15 DIAGNOSIS — R0683 Snoring: Secondary | ICD-10-CM

## 2018-09-15 DIAGNOSIS — I10 Essential (primary) hypertension: Secondary | ICD-10-CM | POA: Diagnosis not present

## 2018-09-15 MED ORDER — CLONIDINE HCL 0.2 MG PO TABS: 0.2000 mg | ORAL_TABLET | Freq: Every day | ORAL | 3 refills | Status: DC

## 2018-09-15 MED ORDER — HYDROCHLOROTHIAZIDE 12.5 MG PO CAPS: 12.5000 mg | ORAL_CAPSULE | Freq: Every day | ORAL | 3 refills | Status: DC

## 2018-09-15 NOTE — Progress Notes (Signed)
Cardiology Office Note:    Date:  09/15/2018   ID:  Tammy Allison, DOB [birth year], MRN [id number]  PCP:  [name], MD  Cardiologist:  No primary care provider on file.   Referring MD: [name], MD   Chief Complaint  Patient presents with  . Hypertension    History of Present Illness:    Tammy Allison is a 83 y.o. female with a hx of hyperlipidemia, gastroesophageal reflux, snoring, who is referred by Dr. [name] for consultation concerning poorly controlled blood pressure.  The patient has a long-standing history of hypertension.  Over the past 6 to 12 months her blood pressure has become difficult to control.  Years ago, her pressure was well controlled on hydrochlorothiazide.  Her current regimen is Catapres 0.2 mg twice daily, metoprolol tartrate 75 mg twice per day, and losartan 100 mg daily.  Additionally, the patient is on potassium.  She denies chest pain, dyspnea, and lower extremity swelling.  She complains of having no energy.  She wants to sit and sleep most days.  She snores based upon her husband's feedback.  She has no history of sleep apnea that she is aware of.  She walks 1.2 miles 4 days/week and it takes her approximately 30 minutes to perform the task.  She does not control salt in her diet.  She has had a prior problem with very low potassium.  Past Medical History:  Diagnosis Date  . Arthritis   . GERD (gastroesophageal reflux disease)   . H/O hiatal hernia   . Hyperlipidemia   . Hypertension   . Hypothyroidism   . Irritable bowel syndrome     Past Surgical History:  Procedure Laterality Date  . CATARACT EXTRACTION    . EYE SURGERY Bilateral    cataract surgery w/ lens implant  . FRACTURE SURGERY     right hip  . LUMBAR LAMINECTOMY/DECOMPRESSION MICRODISCECTOMY Left 05/25/2014   Procedure: LUMBAR LAMINECTOMY/DECOMPRESSION MICRODISCECTOMY 1 LEVEL   lumbar three;  Surgeon: Hosie Spangle, MD;  Location: [facility];  Service:  Neurosurgery;  Laterality: Left;  . THYROID SURGERY      Current Medications: Current Meds  Medication Sig  . cetirizine (ZYRTEC) 10 MG tablet Take 5 mg by mouth daily as needed for allergies.  . cholecalciferol (VITAMIN D) 1000 UNITS tablet Take 1,000 Units by mouth every morning.   . Cyanocobalamin (B-12 PO) Take 1 tablet by mouth daily.  Marland Kitchen levothyroxine (SYNTHROID, LEVOTHROID) 75 MCG tablet Take 75 mcg by mouth daily before breakfast.  . losartan (COZAAR) 100 MG tablet Take 100 mg by mouth daily.  . metoprolol tartrate (LOPRESSOR) 50 MG tablet Take one and a half (1.5) tablets (75 mg) by mouth twice daily.  . Multiple Vitamins-Minerals (PRESERVISION AREDS 2 PO) Take 1 capsule by mouth daily.  . pantoprazole (PROTONIX) 40 MG tablet Take 40 mg by mouth every morning.   . potassium chloride SA (K-DUR,KLOR-CON) 20 MEQ tablet Take 20 mEq by mouth every other day.  . Probiotic Product (ALIGN) 4 MG CAPS Take 4 mg by mouth daily.  . [DISCONTINUED] cloNIDine (CATAPRES) 0.2 MG tablet Take 0.2 mg by mouth 2 (two) times daily.   Current Facility-Administered Medications for the 09/15/18 encounter (Office Visit) with Belva Crome, MD  Medication  . aflibercept (EYLEA) SOLN 2 mg  . Bevacizumab (AVASTIN) SOLN 1.25 mg  . Bevacizumab (AVASTIN) SOLN 1.25 mg  . Bevacizumab (AVASTIN) SOLN 1.25 mg     Allergies:  Sulfa antibiotics   Social History   Socioeconomic History  . Marital status: Married    Spouse name: Not on file  . Number of children: Not on file  . Years of education: Not on file  . Highest education level: Not on file  Occupational History  . Not on file  Social Needs  . Financial resource strain: Not on file  . Food insecurity:    Worry: Not on file    Inability: Not on file  . Transportation needs:    Medical: Not on file    Non-medical: Not on file  Tobacco Use  . Smoking status: Never Smoker  . Smokeless tobacco: Never Used  Substance and Sexual Activity  .  Alcohol use: No  . Drug use: Never  . Sexual activity: Not on file  Lifestyle  . Physical activity:    Days per week: Not on file    Minutes per session: Not on file  . Stress: Not on file  Relationships  . Social connections:    Talks on phone: Not on file    Gets together: Not on file    Attends religious service: Not on file    Active member of club or organization: Not on file    Attends meetings of clubs or organizations: Not on file    Relationship status: Not on file  Other Topics Concern  . Not on file  Social History Narrative  . Not on file     Family History: The patient's family history includes Macular degeneration in her mother.  ROS:   Please see the history of present illness.    Occasional back discomfort.  She is inactive.  Nocturia x4 each night.  Does not sleep well.  All other systems reviewed and are negative.  EKGs/Labs/Other Studies Reviewed:    The following studies were reviewed today: No recent cardiac imaging or vascular studies  A CT scan for pulmonary emboli done in July 2015 demonstrated aortic atherosclerosis..  EKG:  EKG is  ordered today.  The ekg ordered today demonstrates normal sinus rhythm with normal overall appearance.  No prior tracing to compare.  Recent Labs: No results found for requested labs within last 8760 hours.  Recent Lipid Panel No results found for: CHOL, TRIG, HDL, CHOLHDL, VLDL, LDLCALC, LDLDIRECT  Physical Exam:    VS:  BP (!) 182/84   Pulse 64   Ht 5' 4.5" (1.638 m)   Wt 146 lb 12.8 oz (66.6 kg)   BMI 24.81 kg/m     Wt Readings from Last 3 Encounters:  09/15/18 146 lb 12.8 oz (66.6 kg)  05/29/14 131 lb 8 oz (59.6 kg)  05/25/14 135 lb 14.4 oz (61.6 kg)     GEN:  Well nourished, well developed in no acute distress HEENT: Normal NECK: No JVD. LYMPHATICS: No lymphadenopathy CARDIAC: RRR, no murmur, no gallop, no edema. VASCULAR: 2+ bilateral radial pulses.  No bruits. RESPIRATORY:  Clear to  auscultation without rales, wheezing or rhonchi  ABDOMEN: Soft, non-tender, non-distended, No pulsatile mass, MUSCULOSKELETAL: No deformity  SKIN: Warm and dry NEUROLOGIC:  Alert and oriented x 3 PSYCHIATRIC:  Normal affect   ASSESSMENT:    1. Benign essential hypertension   2. Hyperlipidemia LDL goal <70   3. Snoring   4. Other fatigue    PLAN:    In order of problems listed above:  1. Low-salt diet.  Target 140/80 mmHg or less.  Add HCTZ 12.5 mg/day.  Decrease Catapres to  0.2 mg daily with goal to discontinue this therapy when possible.  Continue current dose beta-blocker along with losartan.  Basic metabolic panel in 1 week to assess kidney function and potassium.  If she develops hyponatremia or significant hypokalemia, consider switching to Spironolactone.  Blood pressure clinic in 1 month.  Goal is to DC clonidine at some point.  Reason for increase in blood pressure may be undetected/untreated sleep apnea.  May need to consider renal artery stenosis if blood pressure proves refractory. 2. LDL target should be less than 100 3. I have asked her to speak with her husband concerning her sleep pattern.  Frequent nocturia, inability to sleep, fatigue, and excessive daytime sleepiness or concerning for the possibility of sleep apnea and could be aggravating her blood pressure. 4. She feels that her current antihypertensive regimen is causing fatigue.  It is also possible that if she has sleep apnea, there is some potential contribution.  Plan to see me again in 3 months.  Blood pressure clinic in 1 month.  Blood work in 1 week.   Medication Adjustments/Labs and Tests Ordered: Current medicines are reviewed at length with the patient today.  Concerns regarding medicines are outlined above.  Orders Placed This Encounter  Procedures  . Basic metabolic panel  . EKG 12-Lead   Meds ordered this encounter  Medications  . cloNIDine (CATAPRES) 0.2 MG tablet    Sig: Take 1 tablet (0.2 mg  total) by mouth daily.    Dispense:  90 tablet    Refill:  3    Dose decrease; please keep on file until next refill is needed.  . hydrochlorothiazide (MICROZIDE) 12.5 MG capsule    Sig: Take 1 capsule (12.5 mg total) by mouth daily.    Dispense:  90 capsule    Refill:  3    Patient Instructions  Medication Instructions:  Your physician has recommended you make the following change in your medication:  1.) change clonidine 0.2 mg to once a day 2.) start hctz (hydrochlorothiazide) 12.5 mg once a day If you need a refill on your cardiac medications before your next appointment, please call your pharmacy.   Lab work: In one week - BMET  If you have labs (blood work) drawn today and your tests are completely normal, you will receive your results only by: Marland Kitchen MyChart Message (if you have MyChart) OR . A paper copy in the mail If you have any lab test that is abnormal or we need to change your treatment, we will call you to review the results.  Testing/Procedures: none  Follow-Up: Your physician recommends that you schedule a follow-up appointment in: about 3-4 weeks with the blood pressure clinic.  Your physician recommends that you schedule a follow-up appointment in: 2-3 months with Dr. [name]     Signed, Sinclair Grooms, MD  09/15/2018 4:24 PM    [place]

## 2018-09-15 NOTE — Patient Instructions (Signed)
Medication Instructions:  Your physician has recommended you make the following change in your medication:  1.) change clonidine 0.2 mg to once a day 2.) start hctz (hydrochlorothiazide) 12.5 mg once a day If you need a refill on your cardiac medications before your next appointment, please call your pharmacy.   Lab work: In one week - BMET  If you have labs (blood work) drawn today and your tests are completely normal, you will receive your results only by: Marland Kitchen MyChart Message (if you have MyChart) OR . A paper copy in the mail If you have any lab test that is abnormal or we need to change your treatment, we will call you to review the results.  Testing/Procedures: none  Follow-Up: Your physician recommends that you schedule a follow-up appointment in: about 3-4 weeks with the blood pressure clinic.  Your physician recommends that you schedule a follow-up appointment in: 2-3 months with Dr. [name]

## 2018-09-16 NOTE — Progress Notes (Signed)
[facility] Note  09/20/2018     CHIEF COMPLAINT Patient presents for Retina Follow Up   HISTORY OF PRESENT ILLNESS: Tammy Allison is a 83 y.o. female who presents to the clinic today for:   HPI    Retina Follow Up    Patient presents with  Wet AMD.  In right eye.  Severity is moderate.  Duration of 4 weeks.  I, the attending physician,  performed the HPI with the patient and updated documentation appropriately.          Comments    Patient states vision about the same OU. Sees Dr. Kathlen Mody on 09/24/2018 for potential yag laser--? Which eye. Patient added HCTZ for blood pressure control as BP has been high lately. BP some better since starting new med. Last BP 140/80 (approx.) but systolic number was around 180 before starting new med.       Last edited by Bernarda Caffey, MD on 09/20/2018  9:46 AM. (History)    pt reports vision is about the same, pt states she had no problems following the injection at last visit  Referring physician: [name], MD [place], [place] [address]  HISTORICAL INFORMATION:   Selected notes from the [facility] Referred by Dr. Quentin Ore for exu ARMD OD LEE: 07.01.19 Read Drivers) [BCVA: OD: 20/40+2 OS: 20/25] Ocular Hx-non-exu ARMD OS, open angle glaucoma, pseudophakia OU, PVD, corneal guttata, miosis, asteroid hyalosis, posterior capsular opacification OU PMH-HTN, high cholesterol, thyroid disease    CURRENT MEDICATIONS: No current outpatient medications on file. (Ophthalmic Drugs)   Current Facility-Administered Medications (Ophthalmic Drugs)  Medication Route  . aflibercept (EYLEA) SOLN 2 mg Intravitreal  . aflibercept (EYLEA) SOLN 2 mg Intravitreal   Current Outpatient Medications (Other)  Medication Sig  . cetirizine (ZYRTEC) 10 MG tablet Take 5 mg by mouth daily as needed for allergies.  . cholecalciferol (VITAMIN D) 1000 UNITS tablet Take 1,000 Units by mouth every morning.    . cloNIDine (CATAPRES) 0.2 MG tablet Take 1 tablet (0.2 mg total) by mouth daily.  . Cyanocobalamin (B-12 PO) Take 1 tablet by mouth daily.  . hydrochlorothiazide (MICROZIDE) 12.5 MG capsule Take 1 capsule (12.5 mg total) by mouth daily.  Marland Kitchen levothyroxine (SYNTHROID, LEVOTHROID) 75 MCG tablet Take 75 mcg by mouth daily before breakfast.  . losartan (COZAAR) 100 MG tablet Take 100 mg by mouth daily.  . metoprolol tartrate (LOPRESSOR) 50 MG tablet Take one and a half (1.5) tablets (75 mg) by mouth twice daily.  . Multiple Vitamins-Minerals (PRESERVISION AREDS 2 PO) Take 1 capsule by mouth daily.  . pantoprazole (PROTONIX) 40 MG tablet Take 40 mg by mouth every morning.   . potassium chloride SA (K-DUR,KLOR-CON) 20 MEQ tablet Take 20 mEq by mouth every other day.  . Probiotic Product (ALIGN) 4 MG CAPS Take 4 mg by mouth daily.  Marland Kitchen tolterodine (DETROL LA) 4 MG 24 hr capsule Take 4 mg by mouth once.   Current Facility-Administered Medications (Other)  Medication Route  . Bevacizumab (AVASTIN) SOLN 1.25 mg Intravitreal  . Bevacizumab (AVASTIN) SOLN 1.25 mg Intravitreal  . Bevacizumab (AVASTIN) SOLN 1.25 mg Intravitreal      REVIEW OF SYSTEMS: ROS    Positive for: Musculoskeletal, Endocrine, Cardiovascular, Eyes   Negative for: Constitutional, Gastrointestinal, Neurological, Skin, Genitourinary, HENT, Respiratory, Psychiatric, Allergic/Imm, Heme/Lymph   Last edited by Roselee Nova D on 09/20/2018  9:02 AM. (History)       ALLERGIES  Allergies  Allergen Reactions  . Sulfa Antibiotics Hives    PAST MEDICAL HISTORY Past Medical History:  Diagnosis Date  . Arthritis   . GERD (gastroesophageal reflux disease)   . H/O hiatal hernia   . Hyperlipidemia   . Hypertension   . Hypothyroidism   . Irritable bowel syndrome    Past Surgical History:  Procedure Laterality Date  . CATARACT EXTRACTION    . EYE SURGERY Bilateral    cataract surgery w/ lens implant  . FRACTURE SURGERY      right hip  . LUMBAR LAMINECTOMY/DECOMPRESSION MICRODISCECTOMY Left 05/25/2014   Procedure: LUMBAR LAMINECTOMY/DECOMPRESSION MICRODISCECTOMY 1 LEVEL   lumbar three;  Surgeon: Hosie Spangle, MD;  Location: [facility];  Service: Neurosurgery;  Laterality: Left;  . THYROID SURGERY      FAMILY HISTORY Family History  Problem Relation Age of Onset  . Macular degeneration Mother     SOCIAL HISTORY Social History   Tobacco Use  . Smoking status: Never Smoker  . Smokeless tobacco: Never Used  Substance Use Topics  . Alcohol use: No  . Drug use: Never         OPHTHALMIC EXAM:  Base Eye Exam    Visual Acuity (Snellen - Linear)      Right Left   Dist cc 20/60 +1 20/25 -2   Dist ph cc 20/40 -2 NI   Correction:  Glasses       Tonometry (Tonopen, 9:16 AM)      Right Left   Pressure 11 13       Pupils      Dark Light Shape React APD   Right 2 1 Round Slow None   Left 2 1 Round Slow None       Visual Fields (Counting fingers)      Left Right    Full Full       Extraocular Movement      Right Left    Full, Ortho Full, Ortho       Neuro/Psych    Oriented x3:  Yes   Mood/Affect:  Normal       Dilation    Both eyes:  1.0% Mydriacyl, 2.5% Phenylephrine @ 9:16 AM        Slit Lamp and Fundus Exam    Slit Lamp Exam      Right Left   Lids/Lashes Dermatochalasis - upper lid Dermatochalasis - upper lid   Conjunctiva/Sclera White and quiet White and quiet   Cornea Arcus Arcus   Anterior Chamber Deep and quiet Deep and quiet   Iris Round and dilated Round and dilated   Lens PC IOL in good position, 1+ Posterior capsular opacification PC IOL in good position, 2-3+ Posterior capsular opacification   Vitreous Vitreous syneresis, Posterior vitreous detachment Vitreous syneresis, Posterior vitreous detachment       Fundus Exam      Right Left   Disc Pink and Sharp Pink and Sharp   C/D Ratio 0.3 0.3   Macula Large central PEDs with stable SRF overlying, pigment  mottling and clumping, Drusen Blunted foveal reflex, +PED, Drusen, Retinal pigment epithelial mottling and clumping, no heme   Vessels Vascular attenuation Vascular attenuation   Periphery Attached Normal        Refraction    Wearing Rx      Sphere Cylinder Axis Add   Right -0.75 +0.50 089 +2.50   Left -0.75 Sphere  +2.50   Type:  Bifocal  IMAGING AND PROCEDURES  Imaging and Procedures for @TODAY @  OCT, Retina - OU - Both Eyes       Right Eye Quality was good. Central Foveal Thickness: 355. Progression has been stable. Findings include abnormal foveal contour, no IRF, pigment epithelial detachment, retinal drusen , subretinal fluid, outer retinal atrophy (persistent SRF - no change from prior).   Left Eye Quality was good. Central Foveal Thickness: 285. Progression has been stable. Findings include normal foveal contour, pigment epithelial detachment, no IRF, no SRF.   Notes *Images captured and stored on drive  Diagnosis / Impression:  OD: Exudative ARMD -- persistent SRF overlying PED -- no significant improvement OS: Non-Exudative ARMD w/ stable PEDs  Clinical management:  See below  Abbreviations: NFP - Normal foveal profile. CME - cystoid macular edema. PED - pigment epithelial detachment. IRF - intraretinal fluid. SRF - subretinal fluid. EZ - ellipsoid zone. ERM - epiretinal membrane. ORA - outer retinal atrophy. ORT - outer retinal tubulation. SRHM - subretinal hyper-reflective material         Intravitreal Injection, Pharmacologic Agent - OD - Right Eye       Time Out 09/20/2018. 10:09 AM. Confirmed correct patient, procedure, site, and patient consented.   Anesthesia Topical anesthesia was used. Anesthetic medications included Lidocaine 2%, Proparacaine 0.5%.   Procedure Preparation included 5% betadine to ocular surface, eyelid speculum. A 30 gauge needle was used.   Injection:  2 mg aflibercept 2 MG/0.05ML   NDC: 61755-005-02, Lot:  [id number], Expiration date: 08/17/2019   Route: Intravitreal, Site: Right Eye, Waste: 0.05 mL  Post-op Post injection exam found visual acuity of at least counting fingers. The patient tolerated the procedure well. There were no complications. The patient received written and verbal post procedure care education.                 ASSESSMENT/PLAN:    ICD-10-CM   1. Exudative age-related macular degeneration of right eye with active choroidal neovascularization (HCC) H35.3211 Intravitreal Injection, Pharmacologic Agent - OD - Right Eye    aflibercept (EYLEA) SOLN 2 mg  2. 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 Retinal edema H35.81 OCT, Retina - OU - Both Eyes  4. Posterior vitreous detachment of both eyes H43.813   5. Pseudophakia of both eyes Z96.1   6. PCO (posterior capsular opacification), bilateral H26.493     1,3. Exudative age related macular degeneration, OD              - initial BCVA OD 20/50             - initial OCT w/ PEDs OU (OD > OS); mild SRF overlying PED OD  - initial FA with mild CNVM OD  - S/P IVA OD #1 (07.12.19), #2 (08.12.19), #3 (09.09.19)  - S/P IVE #1 OD (10.07.19)  - today, BCVA stable at BCVA 20/40 and OCT shows persistent SRF, no significant improvement from prior  - recommend IVE #2 OD today (11.04.19)  - pt wishes to be treated with IVE   - RBA of procedure discussed, questions answered  - informed consent obtained and signed  - see procedure note  - Eylea paperwork signed and benefits investigation started on 09.09.19 -- approved for Good Days  - f/u in 4 weeks  2. Age related macular degeneration, non-exudative, OS  - The incidence, anatomy, and pathology of dry AMD, risk of progression, and the AREDS and AREDS 2 study including smoking risks discussed  with patient  - continue amsler grid monitoring  4. PVD / vitreous syneresis OU  Discussed findings and prognosis  No RT or RD on 360  scleral depressed exam  Reviewed s/s of RT/RD  Strict return precautions for any such RT/RD sings/symptoms  5. Pseudophakia OU  - s/p CE/IOL OU  - beautiful surgeries, doing well  - monitor  6. PCO OU (OS > OD) - under the expert management of Dr. Kathlen Mody - okay to proceed with Yag Cap OU from retinal standpoint - discussed possibility of ARMD limiting vision / improvement of YAG cap - pt scheduled for YAG with Dr. Kathlen Mody this Thursday, 11.7.19   Ophthalmic Meds Ordered this visit:  Meds ordered this encounter  Medications  . aflibercept (EYLEA) SOLN 2 mg       Return in about 4 weeks (around 10/18/2018) for F/U Exu ARMD, DFE, OCT.  There are no Patient Instructions on file for this visit.   Explained the diagnoses, plan, and follow up with the patient and they expressed understanding.  Patient expressed understanding of the importance of proper follow up care.   This document serves as a record of services personally performed by Gardiner Sleeper, MD, PhD. It was created on their behalf by Ernest Mallick, OA, an ophthalmic assistant. The creation of this record is the provider's dictation and/or activities during the visit.    Electronically signed by: Ernest Mallick, OA  10.31.19 11:19 AM    Gardiner Sleeper, M.D., Ph.D. Diseases & Surgery of the Retina and Vitreous [facility]   I have reviewed the above documentation for accuracy and completeness, and I agree with the above. Gardiner Sleeper, M.D., Ph.D. 09/20/18 11:22 AM    Abbreviations: M myopia (nearsighted); A astigmatism; H hyperopia (farsighted); P presbyopia; Mrx spectacle prescription;  CTL contact lenses; OD right eye; OS left eye; OU both eyes  XT exotropia; ET esotropia; PEK punctate epithelial keratitis; PEE punctate epithelial erosions; DES dry eye syndrome; MGD meibomian gland dysfunction; ATs artificial tears; PFAT's preservative free artificial tears; [place] nuclear sclerotic cataract; PSC  posterior subcapsular cataract; ERM epi-retinal membrane; PVD posterior vitreous detachment; RD retinal detachment; DM diabetes mellitus; DR diabetic retinopathy; NPDR non-proliferative diabetic retinopathy; PDR proliferative diabetic retinopathy; CSME clinically significant macular edema; DME diabetic macular edema; dbh dot blot hemorrhages; CWS cotton wool spot; POAG primary open angle glaucoma; C/D cup-to-disc ratio; HVF humphrey visual field; GVF goldmann visual field; OCT optical coherence tomography; IOP intraocular pressure; BRVO Branch retinal vein occlusion; CRVO central retinal vein occlusion; CRAO central retinal artery occlusion; BRAO branch retinal artery occlusion; RT retinal tear; SB scleral buckle; PPV pars plana vitrectomy; VH Vitreous hemorrhage; PRP panretinal laser photocoagulation; IVK intravitreal kenalog; VMT vitreomacular traction; MH Macular hole;  NVD neovascularization of the disc; NVE neovascularization elsewhere; AREDS age related eye disease study; ARMD age related macular degeneration; POAG primary open angle glaucoma; EBMD epithelial/anterior basement membrane dystrophy; ACIOL anterior chamber intraocular lens; IOL intraocular lens; PCIOL posterior chamber intraocular lens; Phaco/IOL phacoemulsification with intraocular lens placement; [place] photorefractive keratectomy; LASIK laser assisted in situ keratomileusis; HTN hypertension; DM diabetes mellitus; COPD chronic obstructive pulmonary disease

## 2018-09-20 ENCOUNTER — Encounter (INDEPENDENT_AMBULATORY_CARE_PROVIDER_SITE_OTHER): Payer: Self-pay | Admitting: Ophthalmology

## 2018-09-20 ENCOUNTER — Ambulatory Visit (INDEPENDENT_AMBULATORY_CARE_PROVIDER_SITE_OTHER): Payer: Medicare Other | Admitting: Ophthalmology

## 2018-09-20 DIAGNOSIS — H3581 Retinal edema: Secondary | ICD-10-CM | POA: Diagnosis not present

## 2018-09-20 DIAGNOSIS — H353211 Exudative age-related macular degeneration, right eye, with active choroidal neovascularization: Secondary | ICD-10-CM | POA: Diagnosis not present

## 2018-09-20 DIAGNOSIS — H43813 Vitreous degeneration, bilateral: Secondary | ICD-10-CM

## 2018-09-20 DIAGNOSIS — H26493 Other secondary cataract, bilateral: Secondary | ICD-10-CM

## 2018-09-20 DIAGNOSIS — H353122 Nonexudative age-related macular degeneration, left eye, intermediate dry stage: Secondary | ICD-10-CM

## 2018-09-20 DIAGNOSIS — Z961 Presence of intraocular lens: Secondary | ICD-10-CM

## 2018-09-20 MED ORDER — AFLIBERCEPT 2MG/0.05ML IZ SOLN FOR KALEIDOSCOPE
2.0000 mg | INTRAVITREAL | Status: DC
  Administered 2018-09-20: 2 mg via INTRAVITREAL

## 2018-09-23 DIAGNOSIS — M19012 Primary osteoarthritis, left shoulder: Secondary | ICD-10-CM | POA: Diagnosis not present

## 2018-09-23 DIAGNOSIS — M19011 Primary osteoarthritis, right shoulder: Secondary | ICD-10-CM | POA: Diagnosis not present

## 2018-09-24 ENCOUNTER — Other Ambulatory Visit: Payer: Medicare Other | Admitting: *Deleted

## 2018-09-24 DIAGNOSIS — H353122 Nonexudative age-related macular degeneration, left eye, intermediate dry stage: Secondary | ICD-10-CM | POA: Diagnosis not present

## 2018-09-24 DIAGNOSIS — H26492 Other secondary cataract, left eye: Secondary | ICD-10-CM | POA: Diagnosis not present

## 2018-09-24 DIAGNOSIS — I10 Essential (primary) hypertension: Secondary | ICD-10-CM

## 2018-09-24 DIAGNOSIS — H353211 Exudative age-related macular degeneration, right eye, with active choroidal neovascularization: Secondary | ICD-10-CM | POA: Diagnosis not present

## 2018-09-24 DIAGNOSIS — H40013 Open angle with borderline findings, low risk, bilateral: Secondary | ICD-10-CM | POA: Diagnosis not present

## 2018-09-24 DIAGNOSIS — H26493 Other secondary cataract, bilateral: Secondary | ICD-10-CM | POA: Diagnosis not present

## 2018-09-25 LAB — BASIC METABOLIC PANEL
BUN/Creatinine Ratio: 13 (ref 12–28)
BUN: 14 mg/dL (ref 8–27)
CO2: 26 mmol/L (ref 20–29)
Calcium: 9.6 mg/dL (ref 8.7–10.3)
Chloride: 86 mmol/L — ABNORMAL LOW (ref 96–106)
Creatinine, Ser: 1.04 mg/dL — ABNORMAL HIGH (ref 0.57–1.00)
GFR calc Af Amer: 58 mL/min/{1.73_m2} — ABNORMAL LOW (ref >59)
GFR calc non Af Amer: 51 mL/min/{1.73_m2} — ABNORMAL LOW (ref >59)
Glucose: 125 mg/dL — ABNORMAL HIGH (ref 65–99)
Potassium: 3 mmol/L — ABNORMAL LOW (ref 3.5–5.2)
Sodium: 128 mmol/L — ABNORMAL LOW (ref 134–144)

## 2018-09-27 ENCOUNTER — Telehealth: Payer: Self-pay | Admitting: *Deleted

## 2018-09-27 DIAGNOSIS — Z79899 Other long term (current) drug therapy: Secondary | ICD-10-CM

## 2018-09-27 NOTE — Telephone Encounter (Signed)
-----   Message from Belva Crome, MD sent at 09/25/2018 11:35 AM EST ----- Let the patient know she should stop HCTZ. Take potassium daily for 5 days and repeat BMET on Thursday. If labs normalize, we will try spironolactone. A copy will be sent to [name], MD

## 2018-09-30 ENCOUNTER — Other Ambulatory Visit: Payer: Medicare Other | Admitting: *Deleted

## 2018-09-30 DIAGNOSIS — Z79899 Other long term (current) drug therapy: Secondary | ICD-10-CM | POA: Diagnosis not present

## 2018-10-01 LAB — BASIC METABOLIC PANEL
BUN/Creatinine Ratio: 14 (ref 12–28)
BUN: 14 mg/dL (ref 8–27)
CO2: 22 mmol/L (ref 20–29)
Calcium: 9 mg/dL (ref 8.7–10.3)
Chloride: 94 mmol/L — ABNORMAL LOW (ref 96–106)
Creatinine, Ser: 0.99 mg/dL (ref 0.57–1.00)
GFR calc Af Amer: 62 mL/min/{1.73_m2} (ref >59)
GFR calc non Af Amer: 54 mL/min/{1.73_m2} — ABNORMAL LOW (ref >59)
Glucose: 100 mg/dL — ABNORMAL HIGH (ref 65–99)
Potassium: 4.6 mmol/L (ref 3.5–5.2)
Sodium: 133 mmol/L — ABNORMAL LOW (ref 134–144)

## 2018-10-04 ENCOUNTER — Telehealth: Payer: Self-pay | Admitting: *Deleted

## 2018-10-04 DIAGNOSIS — I10 Essential (primary) hypertension: Secondary | ICD-10-CM

## 2018-10-04 DIAGNOSIS — E876 Hypokalemia: Secondary | ICD-10-CM

## 2018-10-04 MED ORDER — SPIRONOLACTONE 25 MG PO TABS: 12.5000 mg | ORAL_TABLET | Freq: Every day | ORAL | 3 refills | Status: DC

## 2018-10-04 MED ORDER — POTASSIUM CHLORIDE CRYS ER 20 MEQ PO TBCR: 10.0000 meq | EXTENDED_RELEASE_TABLET | Freq: Every day | ORAL | 3 refills | Status: DC

## 2018-10-04 NOTE — Telephone Encounter (Signed)
-----   Message from Belva Crome, MD sent at 10/01/2018 10:57 AM EST ----- Let the patient know labs have essentially normalized.  Start Aldactone 12.5 mg daily.  Decrease potassium to 1/2 tablet daily.  Basic metabolic panel in 1 week. A copy will be sent to [name], MD

## 2018-10-04 NOTE — Telephone Encounter (Signed)
Spoke with pt and went over results and recommendations per Dr. [name].  Pt will have labs drawn on 11/26.  Pt verbalized understanding and was in agreement with this plan.

## 2018-10-07 ENCOUNTER — Ambulatory Visit (INDEPENDENT_AMBULATORY_CARE_PROVIDER_SITE_OTHER): Payer: Medicare Other | Admitting: Pharmacist

## 2018-10-07 VITALS — BP 144/82 | HR 68

## 2018-10-07 DIAGNOSIS — E876 Hypokalemia: Secondary | ICD-10-CM | POA: Diagnosis not present

## 2018-10-07 DIAGNOSIS — I10 Essential (primary) hypertension: Secondary | ICD-10-CM | POA: Diagnosis not present

## 2018-10-07 LAB — BASIC METABOLIC PANEL
BUN/Creatinine Ratio: 10 — ABNORMAL LOW (ref 12–28)
BUN: 10 mg/dL (ref 8–27)
CO2: 23 mmol/L (ref 20–29)
Calcium: 9.2 mg/dL (ref 8.7–10.3)
Chloride: 90 mmol/L — ABNORMAL LOW (ref 96–106)
Creatinine, Ser: 1 mg/dL (ref 0.57–1.00)
GFR calc Af Amer: 61 mL/min/{1.73_m2} (ref >59)
GFR calc non Af Amer: 53 mL/min/{1.73_m2} — ABNORMAL LOW (ref >59)
Glucose: 97 mg/dL (ref 65–99)
Potassium: 4.1 mmol/L (ref 3.5–5.2)
Sodium: 130 mmol/L — ABNORMAL LOW (ref 134–144)

## 2018-10-07 MED ORDER — SPIRONOLACTONE 25 MG PO TABS: 25.0000 mg | ORAL_TABLET | Freq: Every day | ORAL | 3 refills | Status: DC

## 2018-10-07 NOTE — Addendum Note (Signed)
Addended by: Elif Yonts E on: 10/07/2018 04:36 PM   Modules accepted: Orders

## 2018-10-07 NOTE — Patient Instructions (Signed)
Decrease your clonidine to 0.1mg  daily (1/2 tablet daily)  If your labs look stable today, we will increase your spironolactone to 25mg  daily (1 full tablet) and stop your potassium supplement  Move your losartan dosing to the evening when you take your 2nd dose of metoprolol  Follow up in clinic in 3 weeks for a blood pressure check.

## 2018-10-07 NOTE — Progress Notes (Addendum)
Patient ID: Tammy Allison                 DOB: 1935/09/09                      MRN: [id number]     HPI: Tammy Allison is a 83 y.o. female referred by Dr. [name] to HTN clinic. PMH is significant for HTN, HLD, aortic atherosclerosis seen on CT scan in 2015, and GERD. Pt was seen in clinic 3 weeks ago and BP was elevated at 182/84. She was started on HCTZ 12.5mg  daily and clonidine was decreased to 0.2mg  daily. K decreased to 3 and HCTZ was stopped. 3 days ago, pt was started on spironolactone and K supplement was decreased to 63meq daily. She presents today for follow up.  Pt has not had energy for months. She has seen her PCP who thought this might be blood pressure related. Symptoms have not correlated with the start of any new medications. Clonidine and metoprolol would be more likely to be contributing to her symptoms, although it is unlikely that they are the primary cause since she felt fatigued before beginning them. She is taking her clonidine, losartan, and spironolactone in the morning. She is tolerating spironolactone well so far and reports systolic reading of 875 at home.  Current HTN meds: clonidine 0.2mg  daily, losartan 100mg  daily, metoprolol tartrate 75mg  BID, spironolactone 12.5mg  daily  Previously tried: HCTZ- hypokalemia  BP goal: <140/40mmHg per Dr [name]  Family History: Non-contributory  Social History: Denies tobacco, alcohol, and illicit drug use.  Diet: Husband has been sick, not cooking as much. Does not add salt to her food. 1 cup of coffee each morning. Does like snacking on ice cream.  Exercise: Some walking, however has chronic fatigue for a while.   Home BP readings: a few readings - 643P systolic  Wt Readings from Last 3 Encounters:  09/15/18 146 lb 12.8 oz (66.6 kg)  05/29/14 131 lb 8 oz (59.6 kg)  05/25/14 135 lb 14.4 oz (61.6 kg)   BP Readings from Last 3 Encounters:  09/15/18 (!) 182/84  06/01/14 (!) 155/73  05/26/14 131/83   Pulse Readings  from Last 3 Encounters:  09/15/18 64  06/01/14 92  05/26/14 98    Renal function: CrCl cannot be calculated (Unknown ideal weight.).  Past Medical History:  Diagnosis Date  . Arthritis   . GERD (gastroesophageal reflux disease)   . H/O hiatal hernia   . Hyperlipidemia   . Hypertension   . Hypothyroidism   . Irritable bowel syndrome     Current Outpatient Medications on File Prior to Visit  Medication Sig Dispense Refill  . cetirizine (ZYRTEC) 10 MG tablet Take 5 mg by mouth daily as needed for allergies.    . cholecalciferol (VITAMIN D) 1000 UNITS tablet Take 1,000 Units by mouth every morning.     . cloNIDine (CATAPRES) 0.2 MG tablet Take 1 tablet (0.2 mg total) by mouth daily. 90 tablet 3  . Cyanocobalamin (B-12 PO) Take 1 tablet by mouth daily.    Marland Kitchen levothyroxine (SYNTHROID, LEVOTHROID) 75 MCG tablet Take 75 mcg by mouth daily before breakfast.    . losartan (COZAAR) 100 MG tablet Take 100 mg by mouth daily.    . metoprolol tartrate (LOPRESSOR) 50 MG tablet Take one and a half (1.5) tablets (75 mg) by mouth twice daily.    . Multiple Vitamins-Minerals (PRESERVISION AREDS 2 PO) Take 1 capsule by mouth daily.    Marland Kitchen  pantoprazole (PROTONIX) 40 MG tablet Take 40 mg by mouth every morning.     . potassium chloride SA (K-DUR,KLOR-CON) 20 MEQ tablet Take 0.5 tablets (10 mEq total) by mouth daily. 45 tablet 3  . Probiotic Product (ALIGN) 4 MG CAPS Take 4 mg by mouth daily.    Marland Kitchen spironolactone (ALDACTONE) 25 MG tablet Take 0.5 tablets (12.5 mg total) by mouth daily. 45 tablet 3  . tolterodine (DETROL LA) 4 MG 24 hr capsule Take 4 mg by mouth once.  0   Current Facility-Administered Medications on File Prior to Visit  Medication Dose Route Frequency Provider Last Rate Last Dose  . aflibercept (EYLEA) SOLN 2 mg  2 mg Intravitreal  Bernarda Caffey, MD   2 mg at 08/24/18 0811  . aflibercept (EYLEA) SOLN 2 mg  2 mg Intravitreal  Bernarda Caffey, MD   2 mg at 09/20/18 1118  . Bevacizumab  (AVASTIN) SOLN 1.25 mg  1.25 mg Intravitreal  Bernarda Caffey, MD   1.25 mg at 05/28/18 1530  . Bevacizumab (AVASTIN) SOLN 1.25 mg  1.25 mg Intravitreal  Bernarda Caffey, MD   1.25 mg at 06/28/18 0939  . Bevacizumab (AVASTIN) SOLN 1.25 mg  1.25 mg Intravitreal  Bernarda Caffey, MD   1.25 mg at 07/26/18 1000    Allergies  Allergen Reactions  . Sulfa Antibiotics Hives     Assessment/Plan:  1. Hypertension - BP improved however remains above goal <140/31mmHg per Dr [name]. Will decrease clonidine to 0.1mg  daily and plan to stop at next visit. Checking BMET today with recent spironolactone start. If labs are stable, will increase spironolactone to 25mg  daily and stop K 25meq daily. Move losartan 100mg  to evening to space apart BP medications. Continue metoprolol 75mg  BID for now as pt has just ordered a refill through mail order pharmacy. Can change to carvedilol for better BP lowering at future visit. Follow up in HTN clinic in 2-3 weeks. Advised pt to monitor her BP at home and bring in home readings and BP log at f/u visit.   Purvi Ruehl E. Lyrik Buresh, PharmD, BCACP, [place] [address], [place], [place] [address] Phone: [phone]; Fax: [phone] 10/07/2018 11:31 AM  Addendum: BMET stable, will increase spironolactone to 25mg  daily and stop K 106meq daily. Pt is aware and will keep f/u as scheduled. Will recheck BMET at next visit.

## 2018-10-11 DIAGNOSIS — H26491 Other secondary cataract, right eye: Secondary | ICD-10-CM | POA: Diagnosis not present

## 2018-10-12 ENCOUNTER — Other Ambulatory Visit: Payer: Medicare Other

## 2018-10-12 NOTE — Progress Notes (Signed)
[facility] Note  10/18/2018     CHIEF COMPLAINT Patient presents for Retina Follow Up   HISTORY OF PRESENT ILLNESS: Tammy Allison is a 83 y.o. female who presents to the clinic today for:   HPI    Retina Follow Up    Patient presents with  Wet AMD.  In right eye.  This started 4 months ago.  Severity is mild.  Since onset it is gradually improving.  I, the attending physician,  performed the HPI with the patient and updated documentation appropriately.          Comments    F/U EXU AMD OD. Patient states she had "film" removed by DR. Weaver last month, she can now "see to read". Denies new visual onsets/issues. Pt is ready for tx today if indicated.Pt is taking Areds and Probiotic QD.       Last edited by Bernarda Caffey, MD on 10/18/2018 11:06 AM. (History)    pt states Dr. Kathlen Mody did YAG OU and she states her vision is better since having that done,   Referring physician: [name], MD [place], [place] [address]  HISTORICAL INFORMATION:   Selected notes from the MEDICAL RECORD NUMBER Referred by Dr. Quentin Ore for exu ARMD OD LEE: 07.01.19 Read Drivers) [BCVA: OD: 20/40+2 OS: 20/25] Ocular Hx-non-exu ARMD OS, open angle glaucoma, pseudophakia OU, PVD, corneal guttata, miosis, asteroid hyalosis, posterior capsular opacification OU PMH-HTN, high cholesterol, thyroid disease    CURRENT MEDICATIONS: No current outpatient medications on file. (Ophthalmic Drugs)   Current Facility-Administered Medications (Ophthalmic Drugs)  Medication Route  . aflibercept (EYLEA) SOLN 2 mg Intravitreal  . aflibercept (EYLEA) SOLN 2 mg Intravitreal  . aflibercept (EYLEA) SOLN 2 mg Intravitreal   Current Outpatient Medications (Other)  Medication Sig  . cetirizine (ZYRTEC) 10 MG tablet Take 5 mg by mouth daily as needed for allergies.  . cholecalciferol (VITAMIN D) 1000 UNITS tablet Take 1,000 Units by mouth every morning.   . cloNIDine  (CATAPRES) 0.2 MG tablet Take 0.5 tablets (0.1 mg total) by mouth daily.  . Cyanocobalamin (B-12 PO) Take 1 tablet by mouth daily.  Marland Kitchen levothyroxine (SYNTHROID, LEVOTHROID) 75 MCG tablet Take 75 mcg by mouth daily before breakfast.  . losartan (COZAAR) 100 MG tablet Take 100 mg by mouth daily.  . metoprolol tartrate (LOPRESSOR) 50 MG tablet Take one and a half (1.5) tablets (75 mg) by mouth twice daily.  . Multiple Vitamins-Minerals (PRESERVISION AREDS 2 PO) Take 1 capsule by mouth daily.  . pantoprazole (PROTONIX) 40 MG tablet Take 40 mg by mouth every morning.   . Probiotic Product (ALIGN) 4 MG CAPS Take 4 mg by mouth daily.  Marland Kitchen spironolactone (ALDACTONE) 25 MG tablet Take 1 tablet (25 mg total) by mouth daily.  Marland Kitchen tolterodine (DETROL LA) 4 MG 24 hr capsule Take 4 mg by mouth once.   Current Facility-Administered Medications (Other)  Medication Route  . Bevacizumab (AVASTIN) SOLN 1.25 mg Intravitreal  . Bevacizumab (AVASTIN) SOLN 1.25 mg Intravitreal  . Bevacizumab (AVASTIN) SOLN 1.25 mg Intravitreal      REVIEW OF SYSTEMS: ROS    Positive for: Eyes   Negative for: Constitutional, Gastrointestinal, Neurological, Skin, Genitourinary, Musculoskeletal, HENT, Endocrine, Cardiovascular, Respiratory, Psychiatric, Allergic/Imm, Heme/Lymph   Last edited by Zenovia Jordan, LPN on 82/03/538 76:73 AM. (History)       ALLERGIES Allergies  Allergen Reactions  . Sulfa Antibiotics Hives    PAST MEDICAL HISTORY Past  Medical History:  Diagnosis Date  . Arthritis   . GERD (gastroesophageal reflux disease)   . H/O hiatal hernia   . Hyperlipidemia   . Hypertension   . Hypothyroidism   . Irritable bowel syndrome    Past Surgical History:  Procedure Laterality Date  . CATARACT EXTRACTION    . EYE SURGERY Bilateral    cataract surgery w/ lens implant  . FRACTURE SURGERY     right hip  . LUMBAR LAMINECTOMY/DECOMPRESSION MICRODISCECTOMY Left 05/25/2014   Procedure: LUMBAR  LAMINECTOMY/DECOMPRESSION MICRODISCECTOMY 1 LEVEL   lumbar three;  Surgeon: Hosie Spangle, MD;  Location: [facility];  Service: Neurosurgery;  Laterality: Left;  . THYROID SURGERY      FAMILY HISTORY Family History  Problem Relation Age of Onset  . Macular degeneration Mother     SOCIAL HISTORY Social History   Tobacco Use  . Smoking status: Never Smoker  . Smokeless tobacco: Never Used  Substance Use Topics  . Alcohol use: No  . Drug use: Never         OPHTHALMIC EXAM:  Base Eye Exam    Visual Acuity (Snellen - Linear)      Right Left   Dist cc 20/50 +1 20/20   Dist ph cc 20/30 NI   Correction:  Glasses       Tonometry (Tonopen, 10:06 AM)      Right Left   Pressure 12 14       Pupils      Dark Light Shape React APD   Right 3 2 Round Brisk None   Left 3 2 Round Brisk None       Visual Fields (Counting fingers)      Left Right    Full Full       Extraocular Movement      Right Left    Full, Ortho Full, Ortho       Neuro/Psych    Oriented x3:  Yes   Mood/Affect:  Normal       Dilation    Both eyes:  1.0% Mydriacyl, 2.5% Phenylephrine @ 10:01 AM        Slit Lamp and Fundus Exam    Slit Lamp Exam      Right Left   Lids/Lashes Dermatochalasis - upper lid Dermatochalasis - upper lid   Conjunctiva/Sclera White and quiet White and quiet   Cornea Arcus Arcus   Anterior Chamber Deep and quiet Deep and quiet   Iris Round and dilated Round and dilated   Lens PC IOL in good position with open PC PC IOL in good position with open PC   Vitreous Vitreous syneresis, Posterior vitreous detachment Vitreous syneresis, Posterior vitreous detachment       Fundus Exam      Right Left   Disc Pink and Sharp Pink and Sharp   C/D Ratio 0.3 0.4   Macula Large central PEDs with mild persistent SRF overlying -- slightly improvemed, pigment mottling and clumping, Drusen Blunted foveal reflex, +PED, Drusen, Retinal pigment epithelial mottling and clumping, no  heme   Vessels Vascular attenuation Vascular attenuation   Periphery Attached Normal          IMAGING AND PROCEDURES  Imaging and Procedures for @TODAY @  OCT, Retina - OU - Both Eyes       Right Eye Quality was good. Central Foveal Thickness: 354. Progression has improved. Findings include abnormal foveal contour, no IRF, pigment epithelial detachment, retinal drusen , subretinal fluid, outer retinal  atrophy (Mild interval improvement in SRF).   Left Eye Quality was good. Central Foveal Thickness: 279. Progression has been stable. Findings include normal foveal contour, pigment epithelial detachment, no IRF, no SRF.   Notes *Images captured and stored on drive  Diagnosis / Impression:  OD: Exudative ARMD -- interval improvement in SRF overlying PED  OS: Non-Exudative ARMD w/ stable PEDs  Clinical management:  See below  Abbreviations: NFP - Normal foveal profile. CME - cystoid macular edema. PED - pigment epithelial detachment. IRF - intraretinal fluid. SRF - subretinal fluid. EZ - ellipsoid zone. ERM - epiretinal membrane. ORA - outer retinal atrophy. ORT - outer retinal tubulation. SRHM - subretinal hyper-reflective material         Intravitreal Injection, Pharmacologic Agent - OD - Right Eye       Time Out 10/18/2018. 11:33 AM. Confirmed correct patient, procedure, site, and patient consented.   Anesthesia Topical anesthesia was used. Anesthetic medications included Lidocaine 2%, Proparacaine 0.5%.   Procedure Preparation included 5% betadine to ocular surface, eyelid speculum. A 30 gauge needle was used.   Injection:  2 mg aflibercept 2 MG/0.05ML   NDC: 61755-005-02, Lot: [id number], Expiration date: 09/16/2019   Route: Intravitreal, Site: Right Eye, Waste: 0.05 mL  Post-op Post injection exam found visual acuity of at least counting fingers. The patient tolerated the procedure well. There were no complications. The patient received written and verbal post  procedure care education.                 ASSESSMENT/PLAN:    ICD-10-CM   1. Exudative age-related macular degeneration of right eye with active choroidal neovascularization (HCC) H35.3211 Intravitreal Injection, Pharmacologic Agent - OD - Right Eye    aflibercept (EYLEA) SOLN 2 mg  2. 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 Retinal edema H35.81 OCT, Retina - OU - Both Eyes  4. Posterior vitreous detachment of both eyes H43.813   5. Pseudophakia of both eyes Z96.1   6. PCO (posterior capsular opacification), bilateral H26.493     1,3. Exudative age related macular degeneration, OD              - initial BCVA OD 20/50             - initial OCT w/ PEDs OU (OD > OS); mild SRF overlying PED OD  - initial FA with mild CNVM OD  - S/P IVA OD #1 (07.12.19), #2 (08.12.19), #3 (09.09.19)  - S/P IVE #1 OD (10.07.19), #2 (11.04.19)  - today, BCVA improved to 20/30 and OCT shows interval improvement in SRF  - recommend IVE #3 OD today (12.02.19)  - pt wishes to be treated with IVE   - RBA of procedure discussed, questions answered  - informed consent obtained and signed  - see procedure note  - Eylea paperwork signed and benefits investigation started on 09.09.19 -- approved for Good Days  - f/u in 4 weeks  2. Age related macular degeneration, non-exudative, OS  - The incidence, anatomy, and pathology of dry AMD, risk of progression, and the AREDS and AREDS 2 study including smoking risks discussed with patient  - continue amsler grid monitoring  4. PVD / vitreous syneresis OU  Discussed findings and prognosis  No RT or RD on 360 scleral depressed exam  Reviewed s/s of RT/RD  Strict return precautions for any such RT/RD sings/symptoms  5. Pseudophakia OU  - s/p CE/IOL OU  - beautiful surgeries, doing  well  - monitor  6. PCO OU (OS > OD) - s/p YAG cap OU (09/2018) w/ Dr. Kathlen Mody - doing well, VA improved OU   Ophthalmic Meds  Ordered this visit:  Meds ordered this encounter  Medications  . aflibercept (EYLEA) SOLN 2 mg       Return in about 4 weeks (around 11/15/2018) for F/U Exu ARMD OD, DFE, OCT.  There are no Patient Instructions on file for this visit.   Explained the diagnoses, plan, and follow up with the patient and they expressed understanding.  Patient expressed understanding of the importance of proper follow up care.   This document serves as a record of services personally performed by Gardiner Sleeper, MD, PhD. It was created on their behalf by Ernest Mallick, OA, an ophthalmic assistant. The creation of this record is the provider's dictation and/or activities during the visit.    Electronically signed by: Ernest Mallick, OA  11.26.19 11:36 AM    Gardiner Sleeper, M.D., Ph.D. Diseases & Surgery of the Retina and Vitreous [facility]  I have reviewed the above documentation for accuracy and completeness, and I agree with the above. Gardiner Sleeper, M.D., Ph.D. 10/18/18 11:38 AM    Abbreviations: M myopia (nearsighted); A astigmatism; H hyperopia (farsighted); P presbyopia; Mrx spectacle prescription;  CTL contact lenses; OD right eye; OS left eye; OU both eyes  XT exotropia; ET esotropia; PEK punctate epithelial keratitis; PEE punctate epithelial erosions; DES dry eye syndrome; MGD meibomian gland dysfunction; ATs artificial tears; PFAT's preservative free artificial tears; [place] nuclear sclerotic cataract; PSC posterior subcapsular cataract; ERM epi-retinal membrane; PVD posterior vitreous detachment; RD retinal detachment; DM diabetes mellitus; DR diabetic retinopathy; NPDR non-proliferative diabetic retinopathy; PDR proliferative diabetic retinopathy; CSME clinically significant macular edema; DME diabetic macular edema; dbh dot blot hemorrhages; CWS cotton wool spot; POAG primary open angle glaucoma; C/D cup-to-disc ratio; HVF humphrey visual field; GVF goldmann visual field;  OCT optical coherence tomography; IOP intraocular pressure; BRVO Branch retinal vein occlusion; CRVO central retinal vein occlusion; CRAO central retinal artery occlusion; BRAO branch retinal artery occlusion; RT retinal tear; SB scleral buckle; PPV pars plana vitrectomy; VH Vitreous hemorrhage; PRP panretinal laser photocoagulation; IVK intravitreal kenalog; VMT vitreomacular traction; MH Macular hole;  NVD neovascularization of the disc; NVE neovascularization elsewhere; AREDS age related eye disease study; ARMD age related macular degeneration; POAG primary open angle glaucoma; EBMD epithelial/anterior basement membrane dystrophy; ACIOL anterior chamber intraocular lens; IOL intraocular lens; PCIOL posterior chamber intraocular lens; Phaco/IOL phacoemulsification with intraocular lens placement; [place] photorefractive keratectomy; LASIK laser assisted in situ keratomileusis; HTN hypertension; DM diabetes mellitus; COPD chronic obstructive pulmonary disease

## 2018-10-18 ENCOUNTER — Ambulatory Visit (INDEPENDENT_AMBULATORY_CARE_PROVIDER_SITE_OTHER): Payer: Medicare Other | Admitting: Ophthalmology

## 2018-10-18 ENCOUNTER — Encounter (INDEPENDENT_AMBULATORY_CARE_PROVIDER_SITE_OTHER): Payer: Self-pay | Admitting: Ophthalmology

## 2018-10-18 DIAGNOSIS — H3581 Retinal edema: Secondary | ICD-10-CM

## 2018-10-18 DIAGNOSIS — Z961 Presence of intraocular lens: Secondary | ICD-10-CM

## 2018-10-18 DIAGNOSIS — H353211 Exudative age-related macular degeneration, right eye, with active choroidal neovascularization: Secondary | ICD-10-CM | POA: Diagnosis not present

## 2018-10-18 DIAGNOSIS — H43813 Vitreous degeneration, bilateral: Secondary | ICD-10-CM

## 2018-10-18 DIAGNOSIS — H26493 Other secondary cataract, bilateral: Secondary | ICD-10-CM

## 2018-10-18 DIAGNOSIS — H353122 Nonexudative age-related macular degeneration, left eye, intermediate dry stage: Secondary | ICD-10-CM

## 2018-10-18 MED ORDER — AFLIBERCEPT 2MG/0.05ML IZ SOLN FOR KALEIDOSCOPE
2.0000 mg | INTRAVITREAL | Status: DC
  Administered 2018-10-18: 2 mg via INTRAVITREAL

## 2018-10-20 ENCOUNTER — Ambulatory Visit (INDEPENDENT_AMBULATORY_CARE_PROVIDER_SITE_OTHER): Payer: Medicare Other | Admitting: Pharmacist

## 2018-10-20 VITALS — BP 168/84 | HR 87

## 2018-10-20 DIAGNOSIS — I10 Essential (primary) hypertension: Secondary | ICD-10-CM | POA: Diagnosis not present

## 2018-10-20 MED ORDER — CARVEDILOL 12.5 MG PO TABS: 12.5000 mg | ORAL_TABLET | Freq: Two times a day (BID) | ORAL | 11 refills | Status: DC

## 2018-10-20 MED ORDER — SPIRONOLACTONE 50 MG PO TABS: 50.0000 mg | ORAL_TABLET | Freq: Every day | ORAL | 11 refills | Status: DC

## 2018-10-20 NOTE — Progress Notes (Signed)
Patient ID: Tammy Allison                 DOB: 09/19/1935                      MRN: [id number]     HPI: Tammy Allison is a 83 y.o. female referred by Tammy Allison to HTN clinic. PMH is significant for HTN, HLD, aortic atherosclerosis seen on CT scan in 2015, and GERD. Pt was seen in clinic in October 2019 and BP was elevated at 182/84. She was started on HCTZ 12.5mg  daily and clonidine was decreased to 0.2mg  daily, however K decreased to 3 and HCTZ was stopped. She was then started on spironolactone and we have been able to discontinue K supplement, continuing to wean off clonidine.  Pt reports continued lack of energy for months, although slightly improved with clonidine dose decrease. She has seen her PCP who thought this might be blood pressure related. Symptoms have not correlated with the start of any new medications. Clonidine and metoprolol would be more likely to be contributing to her symptoms, although it is unlikely that they are the primary cause since she felt fatigued before beginning them. She has moved her losartan dosing to the evening as instructed at last visit. Her home readings had improved to 062-694W systolic, however over the past few days have increased to 170-190s. Denies NSAID use, additional stress/pain, caffeine, or additional sodium intake.  Pt brought her home cuff to clinic today, reading 185/109. Clinic reading 182/90. She has had her home cuff for less than 1 year. Upon recheck 20 mins later had improved to 168/84. Pt denies headache or blurred vision.  Current HTN meds: clonidine 0.1mg  daily, losartan 100mg  daily (PM), metoprolol tartrate 75mg  BID, spironolactone 25mg  daily  Previously tried: HCTZ- hypokalemia  BP goal: <140/69mmHg per Dr Tamala Allison  Family History: Non-contributory  Social History: Denies tobacco, alcohol, and illicit drug use.  Diet: Husband has been sick, not cooking as much. Does not add salt to her food. 1 cup of coffee each morning. Does  like snacking on ice cream.  Exercise: Some walking, however has chronic fatigue for a while.   Home BP readings: Had been improving, low of 121/72 ranging to 144/81, past few days readings increased to 160-180s/90s  Wt Readings from Last 3 Encounters:  09/15/18 146 lb 12.8 oz (66.6 kg)  05/29/14 131 lb 8 oz (59.6 kg)  05/25/14 135 lb 14.4 oz (61.6 kg)   BP Readings from Last 3 Encounters:  10/07/18 (!) 144/82  09/15/18 (!) 182/84  06/01/14 (!) 155/73   Pulse Readings from Last 3 Encounters:  10/07/18 68  09/15/18 64  06/01/14 92    Renal function: CrCl cannot be calculated (Unknown ideal weight.).  Past Medical History:  Diagnosis Date  . Arthritis   . GERD (gastroesophageal reflux disease)   . H/O hiatal hernia   . Hyperlipidemia   . Hypertension   . Hypothyroidism   . Irritable bowel syndrome     Current Outpatient Medications on File Prior to Visit  Medication Sig Dispense Refill  . cetirizine (ZYRTEC) 10 MG tablet Take 5 mg by mouth daily as needed for allergies.    . cholecalciferol (VITAMIN D) 1000 UNITS tablet Take 1,000 Units by mouth every morning.     . cloNIDine (CATAPRES) 0.2 MG tablet Take 0.5 tablets (0.1 mg total) by mouth daily. 90 tablet 3  . Cyanocobalamin (B-12 PO) Take 1 tablet by  mouth daily.    Marland Kitchen levothyroxine (SYNTHROID, LEVOTHROID) 75 MCG tablet Take 75 mcg by mouth daily before breakfast.    . losartan (COZAAR) 100 MG tablet Take 100 mg by mouth daily.    . metoprolol tartrate (LOPRESSOR) 50 MG tablet Take one and a half (1.5) tablets (75 mg) by mouth twice daily.    . Multiple Vitamins-Minerals (PRESERVISION AREDS 2 PO) Take 1 capsule by mouth daily.    . pantoprazole (PROTONIX) 40 MG tablet Take 40 mg by mouth every morning.     . Probiotic Product (ALIGN) 4 MG CAPS Take 4 mg by mouth daily.    Marland Kitchen spironolactone (ALDACTONE) 25 MG tablet Take 1 tablet (25 mg total) by mouth daily. 45 tablet 3  . tolterodine (DETROL LA) 4 MG 24 hr capsule Take  4 mg by mouth once.  0   Current Facility-Administered Medications on File Prior to Visit  Medication Dose Route Frequency Provider Last Rate Last Dose  . aflibercept (EYLEA) SOLN 2 mg  2 mg Intravitreal  Bernarda Caffey, MD   2 mg at 08/24/18 9563  . aflibercept (EYLEA) SOLN 2 mg  2 mg Intravitreal  Bernarda Caffey, MD   2 mg at 09/20/18 1118  . aflibercept (EYLEA) SOLN 2 mg  2 mg Intravitreal  Bernarda Caffey, MD   2 mg at 10/18/18 1133  . Bevacizumab (AVASTIN) SOLN 1.25 mg  1.25 mg Intravitreal  Bernarda Caffey, MD   1.25 mg at 05/28/18 1530  . Bevacizumab (AVASTIN) SOLN 1.25 mg  1.25 mg Intravitreal  Bernarda Caffey, MD   1.25 mg at 06/28/18 0939  . Bevacizumab (AVASTIN) SOLN 1.25 mg  1.25 mg Intravitreal  Bernarda Caffey, MD   1.25 mg at 07/26/18 1000    Allergies  Allergen Reactions  . Sulfa Antibiotics Hives     Assessment/Plan:  1. Hypertension - Unsure why BP has increased notably over the past few days, although improved upon recheck. No notable changes in stress, pain level, caffeine, sodium, or NSAID use, reports compliance with all meds. Will check BMET today and plan to increase spironolactone to 50mg  daily. Will also stop metoprolol and start carvedilol 12.5mg  BID for better BP lowering. Continue clonidine 0.1mg  once daily for now and will hopefully be able to d/c at future visit. F/u in clinic in 2 weeks for BP check.   Atina Feeley E. Deshawnda Acrey, PharmD, BCACP, [facility] [address] [place], [place] [address] Phone: [phone]; Fax: [phone] 10/20/2018 2:19 PM

## 2018-10-20 NOTE — Patient Instructions (Addendum)
It was nice to see you today  Increase your spironolactone to 50mg  once a day  STOP taking metoprolol  START taking carvedilol 12.5mg  (1 tablet) twice a day  Continue taking your other medications and monitor your blood pressure at home - your goal is < 140/59mmHg  Follow up in clinic in 2 weeks for a blood pressure check  Call Megan, Pharmacist with any concerns before then [phone]

## 2018-10-21 LAB — BASIC METABOLIC PANEL
BUN/Creatinine Ratio: 12 (ref 12–28)
BUN: 12 mg/dL (ref 8–27)
CO2: 24 mmol/L (ref 20–29)
Calcium: 9.3 mg/dL (ref 8.7–10.3)
Chloride: 94 mmol/L — ABNORMAL LOW (ref 96–106)
Creatinine, Ser: 1.03 mg/dL — ABNORMAL HIGH (ref 0.57–1.00)
GFR calc Af Amer: 59 mL/min/{1.73_m2} — ABNORMAL LOW (ref >59)
GFR calc non Af Amer: 51 mL/min/{1.73_m2} — ABNORMAL LOW (ref >59)
Glucose: 126 mg/dL — ABNORMAL HIGH (ref 65–99)
Potassium: 3.7 mmol/L (ref 3.5–5.2)
Sodium: 133 mmol/L — ABNORMAL LOW (ref 134–144)

## 2018-11-01 ENCOUNTER — Telehealth: Payer: Self-pay | Admitting: Pharmacist

## 2018-11-01 NOTE — Telephone Encounter (Signed)
Pt called clinic with reports of rash beginning 1 week ago - very itchy and reports it covers most of her body. She is using a cortisone cream and has occasionally taken an antihistamine. Encouraged pt to continue with cortisone cream and use Zyrtec daily. Will also stop carvedilol and resume metoprolol at previous dose of 75mg  BID to see if this was the cause of pt's rash. She will keep f/u in HTN clinic this week.

## 2018-11-03 ENCOUNTER — Ambulatory Visit (INDEPENDENT_AMBULATORY_CARE_PROVIDER_SITE_OTHER): Payer: Medicare Other | Admitting: Pharmacist

## 2018-11-03 VITALS — BP 120/82 | HR 72

## 2018-11-03 DIAGNOSIS — I10 Essential (primary) hypertension: Secondary | ICD-10-CM

## 2018-11-03 NOTE — Progress Notes (Signed)
Patient ID: Tammy Allison                 DOB: 03-06-1935                      MRN: [id number]     HPI: Tammy Allison is a 83 y.o. female referred by Dr. Tamala Allison to HTN clinic. PMH is significant for HTN, HLD, aortic atherosclerosis seen on CT scan in 2015, and GERD. Pt was seen in clinic in October 2019 and BP was elevated at 182/84. She was started on HCTZ 12.5mg  daily and clonidine was decreased to 0.2mg  daily, however K decreased to 3 and HCTZ was stopped. She was then started on spironolactone and we have been able to discontinue K supplement, continuing to wean off clonidine. At last visit, BP was abnormally high at 168/84. Spironolactone was increased to 50mg  daily and metoprolol was switched to carvedilol 12.5mg  BID for better BP lowering. Pt called clinic 2 weeks later with reports of itchy, full body rash for 1 week. Stopped carvedilol and resumed previous dose of metoprolol. Pt presents today for follow up.  Pt developed full body rash 4-5 days after starting carvedilol. Red and diffuse today. Pt continues to use cortisone cream and Zyrtec. Reports rash may have slightly improved last night. She never developed any rash on prior metoprolol therapy. She remains under a great deal of stress caring for her husband who was recently in the ICU. BP at home has been controlled. Pt reports continued lack of energy for months, although slightly improved with prior clonidine dose decrease. Denies NSAID use, additional stress/pain, caffeine, or additional sodium intake. BP cuff previously brought to clinic and home readings correlated well with clinic readings.  Current HTN meds: clonidine 0.1mg  daily, losartan 100mg  daily (PM), metoprolol tartrate 75mg  BID, spironolactone 50mg  daily  Previously tried: HCTZ - hypokalemia, carvedilol - rash  BP goal: <140/36mmHg per Dr Tammy Allison  Family History: Non-contributory  Social History: Denies tobacco, alcohol, and illicit drug use.  Diet: Husband has been  sick, not cooking as much. Does not add salt to her food. 1 cup of coffee each morning. Does like snacking on ice cream.  Exercise: Some walking, however has chronic fatigue for a while.   Wt Readings from Last 3 Encounters:  09/15/18 146 lb 12.8 oz (66.6 kg)  05/29/14 131 lb 8 oz (59.6 kg)  05/25/14 135 lb 14.4 oz (61.6 kg)   BP Readings from Last 3 Encounters:  10/20/18 (!) 168/84  10/07/18 (!) 144/82  09/15/18 (!) 182/84   Pulse Readings from Last 3 Encounters:  10/20/18 87  10/07/18 68  09/15/18 64    Renal function: CrCl cannot be calculated (Unknown ideal weight.).  Past Medical History:  Diagnosis Date  . Arthritis   . GERD (gastroesophageal reflux disease)   . H/O hiatal hernia   . Hyperlipidemia   . Hypertension   . Hypothyroidism   . Irritable bowel syndrome     Current Outpatient Medications on File Prior to Visit  Medication Sig Dispense Refill  . cetirizine (ZYRTEC) 10 MG tablet Take 5 mg by mouth daily as needed for allergies.    . cholecalciferol (VITAMIN D) 1000 UNITS tablet Take 1,000 Units by mouth every morning.     . cloNIDine (CATAPRES) 0.2 MG tablet Take 0.5 tablets (0.1 mg total) by mouth daily. 90 tablet 3  . Cyanocobalamin (B-12 PO) Take 1 tablet by mouth daily.    Marland Kitchen levothyroxine (  SYNTHROID, LEVOTHROID) 75 MCG tablet Take 75 mcg by mouth daily before breakfast.    . losartan (COZAAR) 100 MG tablet Take 100 mg by mouth daily.    . metoprolol tartrate (LOPRESSOR) 50 MG tablet Take 1.5 tablets (75 mg total) by mouth 2 (two) times daily. 180 tablet 3  . Multiple Vitamins-Minerals (PRESERVISION AREDS 2 PO) Take 1 capsule by mouth daily.    . pantoprazole (PROTONIX) 40 MG tablet Take 40 mg by mouth every morning.     . Probiotic Product (ALIGN) 4 MG CAPS Take 4 mg by mouth daily.    Marland Kitchen spironolactone (ALDACTONE) 50 MG tablet Take 1 tablet (50 mg total) by mouth daily. 30 tablet 11  . tolterodine (DETROL LA) 4 MG 24 hr capsule Take 4 mg by mouth once.   0   Current Facility-Administered Medications on File Prior to Visit  Medication Dose Route Frequency Provider Last Rate Last Dose  . aflibercept (EYLEA) SOLN 2 mg  2 mg Intravitreal  Bernarda Caffey, MD   2 mg at 08/24/18 9373  . aflibercept (EYLEA) SOLN 2 mg  2 mg Intravitreal  Bernarda Caffey, MD   2 mg at 09/20/18 1118  . aflibercept (EYLEA) SOLN 2 mg  2 mg Intravitreal  Bernarda Caffey, MD   2 mg at 10/18/18 1133  . Bevacizumab (AVASTIN) SOLN 1.25 mg  1.25 mg Intravitreal  Bernarda Caffey, MD   1.25 mg at 05/28/18 1530  . Bevacizumab (AVASTIN) SOLN 1.25 mg  1.25 mg Intravitreal  Bernarda Caffey, MD   1.25 mg at 06/28/18 0939  . Bevacizumab (AVASTIN) SOLN 1.25 mg  1.25 mg Intravitreal  Bernarda Caffey, MD   1.25 mg at 07/26/18 1000    Allergies  Allergen Reactions  . Sulfa Antibiotics Hives     Assessment/Plan:  1. Hypertension - BP much improved and now at goal <140/59mmHg per Dr Tammy Allison. Unfortunately, carvedilol caused diffuse full body rash. Switched carvedilol back to metoprolol 1-2 days ago when pt called and she will continue to monitor for symptom improvement and resolution. Will discontinue clonidine to see if this helps improve patient's fatigue. Continue losartan 100mg  daily, metoprolol tartrate 75mg  BID, and spironolactone 50mg  daily. Checking BMET today on higher dose of spironolactone. Pt will continue to monitor BP at home and call clinic with concerns, or if her rash does not improve.   Megan E. Supple, PharmD, BCACP, [place] [address], [place], [place] [address] Phone: [phone]; Fax: [phone] 11/03/2018 9:00 AM

## 2018-11-03 NOTE — Patient Instructions (Addendum)
It was nice to see you today  Continue to take Zyrtec once a day and you can take a Benadryl before you go to bed as well - continue to monitor your rash  Stop taking your clonidine - monitor for improvement in your fatigue  Continue taking your losartan, metoprolol, and spironolactone  Monitor your blood pressure at home, your goal is < 130/40mmHg  Call Megan in the hypertension clinic with any concerns about your blood pressure [phone]

## 2018-11-04 LAB — BASIC METABOLIC PANEL
BUN/Creatinine Ratio: 13 (ref 12–28)
BUN: 14 mg/dL (ref 8–27)
CO2: 21 mmol/L (ref 20–29)
Calcium: 9.1 mg/dL (ref 8.7–10.3)
Chloride: 94 mmol/L — ABNORMAL LOW (ref 96–106)
Creatinine, Ser: 1.09 mg/dL — ABNORMAL HIGH (ref 0.57–1.00)
GFR calc Af Amer: 55 mL/min/{1.73_m2} — ABNORMAL LOW (ref >59)
GFR calc non Af Amer: 48 mL/min/{1.73_m2} — ABNORMAL LOW (ref >59)
Glucose: 104 mg/dL — ABNORMAL HIGH (ref 65–99)
Potassium: 4.1 mmol/L (ref 3.5–5.2)
Sodium: 131 mmol/L — ABNORMAL LOW (ref 134–144)

## 2018-11-05 ENCOUNTER — Telehealth: Payer: Self-pay | Admitting: Pharmacist

## 2018-11-05 NOTE — Telephone Encounter (Signed)
Pt called to report that she is still itching and that her rash is improved, but minimally. She reports blood pressure has been elevated 189/84, 162/80, 152/79 with HR 68-70 since changing carvedilol back to metoprolol.   Will increase metoprolol to 100mg  BID. Advised to continue to monitor itching and rash and call if not improved by Monday. She states understanding.

## 2018-11-15 ENCOUNTER — Telehealth: Payer: Self-pay | Admitting: Pharmacist

## 2018-11-15 ENCOUNTER — Ambulatory Visit (INDEPENDENT_AMBULATORY_CARE_PROVIDER_SITE_OTHER): Payer: Medicare Other | Admitting: Ophthalmology

## 2018-11-15 ENCOUNTER — Encounter (INDEPENDENT_AMBULATORY_CARE_PROVIDER_SITE_OTHER): Payer: Self-pay | Admitting: Ophthalmology

## 2018-11-15 DIAGNOSIS — H3581 Retinal edema: Secondary | ICD-10-CM

## 2018-11-15 DIAGNOSIS — H353211 Exudative age-related macular degeneration, right eye, with active choroidal neovascularization: Secondary | ICD-10-CM | POA: Diagnosis not present

## 2018-11-15 DIAGNOSIS — H353122 Nonexudative age-related macular degeneration, left eye, intermediate dry stage: Secondary | ICD-10-CM

## 2018-11-15 DIAGNOSIS — Z961 Presence of intraocular lens: Secondary | ICD-10-CM

## 2018-11-15 DIAGNOSIS — H26493 Other secondary cataract, bilateral: Secondary | ICD-10-CM

## 2018-11-15 DIAGNOSIS — H43813 Vitreous degeneration, bilateral: Secondary | ICD-10-CM

## 2018-11-15 MED ORDER — HYDROXYZINE HCL 25 MG PO TABS: 25.0000 mg | ORAL_TABLET | Freq: Three times a day (TID) | ORAL | 1 refills | Status: DC | PRN

## 2018-11-15 NOTE — Telephone Encounter (Signed)
Pt states her rash has almost disappeared since switching back from carvedilol to metoprolol, but her itching has continued. She is taking Zyrtec daily and using cortisone cream. She has also been more anxious lately due to her husband's illness. She has spoke with her PCP about this as well. Will try pt on hydroxyzine as this can help with both itching and anxiety. Pt will follow up via telephone as needed. Systolic BPs have been ~282.

## 2018-11-15 NOTE — Progress Notes (Signed)
[facility] Note  11/15/2018     CHIEF COMPLAINT Patient presents for Retina Follow Up   HISTORY OF PRESENT ILLNESS: Tammy Allison is a 83 y.o. female who presents to the clinic today for:   HPI    Retina Follow Up    Patient presents with  Wet AMD.  In right eye.  This started months ago.  Severity is moderate.  Duration of 4 weeks.  Since onset it is stable.  I, the attending physician,  performed the HPI with the patient and updated documentation appropriately.          Comments    83 y/o female pt here for 4 wk f/u for exu ARMD w/active CNV OD.  No change in [place] OU.  Denies pain, flashes, floaters.  No gtts.       Last edited by Bernarda Caffey, MD on 11/17/2018  2:21 PM. (History)    pt states her husband spent 4 nights in the ICU for a sepsis infection, she states he is now in rehab learning how to walk again, pt states there is not much change in her vision   Referring physician: [name], MD [place], [place] [address]  HISTORICAL INFORMATION:   Selected notes from the [facility] Referred by Dr. Quentin Ore for exu ARMD OD LEE: 07.01.19 Read Drivers) [BCVA: OD: 20/40+2 OS: 20/25] Ocular Hx-non-exu ARMD OS, open angle glaucoma, pseudophakia OU, PVD, corneal guttata, miosis, asteroid hyalosis, posterior capsular opacification OU PMH-HTN, high cholesterol, thyroid disease    CURRENT MEDICATIONS: No current outpatient medications on file. (Ophthalmic Drugs)   Current Facility-Administered Medications (Ophthalmic Drugs)  Medication Route  . aflibercept (EYLEA) SOLN 2 mg Intravitreal  . aflibercept (EYLEA) SOLN 2 mg Intravitreal  . aflibercept (EYLEA) SOLN 2 mg Intravitreal  . aflibercept (EYLEA) SOLN 2 mg Intravitreal   Current Outpatient Medications (Other)  Medication Sig  . cetirizine (ZYRTEC) 10 MG tablet Take 5 mg by mouth daily as needed for allergies.  . cholecalciferol (VITAMIN D) 1000 UNITS  tablet Take 1,000 Units by mouth every morning.   . Cyanocobalamin (B-12 PO) Take 1 tablet by mouth daily.  . hydrOXYzine (ATARAX/VISTARIL) 25 MG tablet Take 1 tablet (25 mg total) by mouth 3 (three) times daily as needed.  Marland Kitchen levothyroxine (SYNTHROID, LEVOTHROID) 75 MCG tablet Take 75 mcg by mouth daily before breakfast.  . losartan (COZAAR) 100 MG tablet Take 100 mg by mouth daily.  . metoprolol tartrate (LOPRESSOR) 50 MG tablet Take 100 mg by mouth 2 (two) times daily.  . Multiple Vitamins-Minerals (PRESERVISION AREDS 2 PO) Take 1 capsule by mouth daily.  . pantoprazole (PROTONIX) 40 MG tablet Take 40 mg by mouth every morning.   . Probiotic Product (ALIGN) 4 MG CAPS Take 4 mg by mouth daily.  Marland Kitchen spironolactone (ALDACTONE) 50 MG tablet Take 1 tablet (50 mg total) by mouth daily.  Marland Kitchen tolterodine (DETROL LA) 4 MG 24 hr capsule Take 4 mg by mouth once.   Current Facility-Administered Medications (Other)  Medication Route  . Bevacizumab (AVASTIN) SOLN 1.25 mg Intravitreal  . Bevacizumab (AVASTIN) SOLN 1.25 mg Intravitreal  . Bevacizumab (AVASTIN) SOLN 1.25 mg Intravitreal      REVIEW OF SYSTEMS: ROS    Positive for: Eyes   Negative for: Constitutional, Gastrointestinal, Neurological, Skin, Genitourinary, Musculoskeletal, HENT, Endocrine, Cardiovascular, Respiratory, Psychiatric, Allergic/Imm, Heme/Lymph   Last edited by Matthew Folks, COA on 11/15/2018  3:01 PM. (History)  ALLERGIES Allergies  Allergen Reactions  . Carvedilol     Full body rash  . Sulfa Antibiotics Hives    PAST MEDICAL HISTORY Past Medical History:  Diagnosis Date  . Arthritis   . GERD (gastroesophageal reflux disease)   . H/O hiatal hernia   . Hyperlipidemia   . Hypertension   . Hypothyroidism   . Irritable bowel syndrome   . Macular degeneration    Exudative - OD   Past Surgical History:  Procedure Laterality Date  . CATARACT EXTRACTION    . EYE SURGERY Bilateral    cataract surgery w/  lens implant  . FRACTURE SURGERY     right hip  . LUMBAR LAMINECTOMY/DECOMPRESSION MICRODISCECTOMY Left 05/25/2014   Procedure: LUMBAR LAMINECTOMY/DECOMPRESSION MICRODISCECTOMY 1 LEVEL   lumbar three;  Surgeon: Hosie Spangle, MD;  Location: [facility];  Service: Neurosurgery;  Laterality: Left;  . THYROID SURGERY      FAMILY HISTORY Family History  Problem Relation Age of Onset  . Macular degeneration Mother     SOCIAL HISTORY Social History   Tobacco Use  . Smoking status: Never Smoker  . Smokeless tobacco: Never Used  Substance Use Topics  . Alcohol use: No  . Drug use: Never         OPHTHALMIC EXAM:  Base Eye Exam    Visual Acuity (Snellen - Linear)      Right Left   Dist cc 20/50 20/20 -2   Dist ph cc 20/30    Correction:  Glasses       Tonometry (Tonopen, 3:02 PM)      Right Left   Pressure 14 14       Pupils      Dark Light Shape React APD   Right 3 2 Round Brisk None   Left 3 2 Round Brisk None       Visual Fields (Counting fingers)      Left Right    Full Full       Extraocular Movement      Right Left    Full, Ortho Full, Ortho       Neuro/Psych    Oriented x3:  Yes   Mood/Affect:  Normal       Dilation    Both eyes:  1.0% Mydriacyl, 2.5% Phenylephrine @ 3:02 PM        Slit Lamp and Fundus Exam    Slit Lamp Exam      Right Left   Lids/Lashes Dermatochalasis - upper lid Dermatochalasis - upper lid   Conjunctiva/Sclera White and quiet White and quiet   Cornea Arcus Arcus   Anterior Chamber Deep and quiet Deep and quiet   Iris Round and dilated Round and dilated   Lens PC IOL in good position with open PC PC IOL in good position with open PC   Vitreous Vitreous syneresis, Posterior vitreous detachment Vitreous syneresis, Posterior vitreous detachment       Fundus Exam      Right Left   Disc Pink and Sharp Pink and Sharp   C/D Ratio 0.3 0.4   Macula Large central PEDs with mild persistent SRF overlying -- slightly improved,  pigment mottling and clumping, Drusen Blunted foveal reflex, +PED, Drusen, Retinal pigment epithelial mottling and clumping, no heme   Vessels Vascular attenuation Vascular attenuation   Periphery Attached Normal          IMAGING AND PROCEDURES  Imaging and Procedures for @TODAY @  OCT, Retina - OU -  Both Eyes       Right Eye Quality was good. Central Foveal Thickness: 355. Progression has been stable. Findings include abnormal foveal contour, no IRF, pigment epithelial detachment, retinal drusen , subretinal fluid, outer retinal atrophy (Mild interval improvement in SRF).   Left Eye Quality was good. Central Foveal Thickness: 280. Progression has been stable. Findings include normal foveal contour, pigment epithelial detachment, no IRF, no SRF.   Notes *Images captured and stored on drive  Diagnosis / Impression:  OD: Exudative ARMD -- ?interval improvement in SRF overlying PED  OS: Non-Exudative ARMD w/ stable PEDs  Clinical management:  See below  Abbreviations: NFP - Normal foveal profile. CME - cystoid macular edema. PED - pigment epithelial detachment. IRF - intraretinal fluid. SRF - subretinal fluid. EZ - ellipsoid zone. ERM - epiretinal membrane. ORA - outer retinal atrophy. ORT - outer retinal tubulation. SRHM - subretinal hyper-reflective material         Intravitreal Injection, Pharmacologic Agent - OD - Right Eye       Time Out 11/15/2018. 4:54 PM. Confirmed correct patient, procedure, site, and patient consented.   Anesthesia Topical anesthesia was used. Anesthetic medications included Lidocaine 2%, Proparacaine 0.5%.   Procedure Preparation included 5% betadine to ocular surface, eyelid speculum. A 30 gauge needle was used.   Injection:  2 mg aflibercept Alfonse Flavors) SOLN   NDC: [id number], Lot: [id number], Expiration date: 09/16/2019   Route: Intravitreal, Site: Right Eye, Waste: 0.05 mL  Post-op Post injection exam found visual acuity of at least  counting fingers. The patient tolerated the procedure well. There were no complications. The patient received written and verbal post procedure care education.                 ASSESSMENT/PLAN:    ICD-10-CM   1. Exudative age-related macular degeneration of right eye with active choroidal neovascularization (HCC) H35.3211 Intravitreal Injection, Pharmacologic Agent - OD - Right Eye    aflibercept (EYLEA) SOLN 2 mg  2. 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 Retinal edema H35.81 OCT, Retina - OU - Both Eyes  4. Posterior vitreous detachment of both eyes H43.813   5. Pseudophakia of both eyes Z96.1   6. PCO (posterior capsular opacification), bilateral H26.493     1,3. Exudative age related macular degeneration, OD              - initial BCVA OD 20/30             - initial OCT w/ PEDs OU (OD > OS); mild SRF overlying PED OD  - initial FA with mild CNVM OD  - S/P IVA OD #1 (07.12.19), #2 (08.12.19), #3 (09.09.19)  - S/P IVE #1 OD (10.07.19), #2 (11.04.19), #3 (12.02.19)  - today, BCVA w/ stable improvement at 20/30 and OCT shows tr interval improvement in SRF overlying PEDs  - recommend IVE #4 OD today (12.30.19)  - pt wishes to be treated with IVE   - RBA of procedure discussed, questions answered  - informed consent obtained and signed  - see procedure note  - Eylea paperwork signed and benefits investigation started on 09.09.19 -- approved for Good Days  - f/u in 4 weeks  2. Age related macular degeneration, non-exudative, OS  - The incidence, anatomy, and pathology of dry AMD, risk of progression, and the AREDS and AREDS 2 study including smoking risks discussed with patient  - continue amsler grid monitoring  4. PVD / vitreous  syneresis OU  Discussed findings and prognosis  No RT or RD on 360 scleral depressed exam  Reviewed s/s of RT/RD  Strict return precautions for any such RT/RD sings/symptoms  5. Pseudophakia  OU  - s/p CE/IOL OU  - beautiful surgeries, doing well  - monitor  6. PCO OU (OS > OD) - s/p YAG cap OU (09/2018) w/ Dr. Kathlen Mody - doing well, VA improved OU   Ophthalmic Meds Ordered this visit:  Meds ordered this encounter  Medications  . aflibercept (EYLEA) SOLN 2 mg       Return in about 4 weeks (around 12/13/2018) for F/U exu ARMD OD, DFE, OCT.  There are no Patient Instructions on file for this visit.   Explained the diagnoses, plan, and follow up with the patient and they expressed understanding.  Patient expressed understanding of the importance of proper follow up care.   This document serves as a record of services personally performed by Gardiner Sleeper, MD, PhD. It was created on their behalf by Ernest Mallick, OA, an ophthalmic assistant. The creation of this record is the provider's dictation and/or activities during the visit.    Electronically signed by: Ernest Mallick, OA  12.30.19 2:21 PM    Gardiner Sleeper, M.D., Ph.D. Diseases & Surgery of the Retina and Vitreous [facility]  I have reviewed the above documentation for accuracy and completeness, and I agree with the above. Gardiner Sleeper, M.D., Ph.D. 11/17/18 2:22 PM    Abbreviations: M myopia (nearsighted); A astigmatism; H hyperopia (farsighted); P presbyopia; Mrx spectacle prescription;  CTL contact lenses; OD right eye; OS left eye; OU both eyes  XT exotropia; ET esotropia; PEK punctate epithelial keratitis; PEE punctate epithelial erosions; DES dry eye syndrome; MGD meibomian gland dysfunction; ATs artificial tears; PFAT's preservative free artificial tears; [place] nuclear sclerotic cataract; PSC posterior subcapsular cataract; ERM epi-retinal membrane; PVD posterior vitreous detachment; RD retinal detachment; DM diabetes mellitus; DR diabetic retinopathy; NPDR non-proliferative diabetic retinopathy; PDR proliferative diabetic retinopathy; CSME clinically significant macular edema; DME  diabetic macular edema; dbh dot blot hemorrhages; CWS cotton wool spot; POAG primary open angle glaucoma; C/D cup-to-disc ratio; HVF humphrey visual field; GVF goldmann visual field; OCT optical coherence tomography; IOP intraocular pressure; BRVO Branch retinal vein occlusion; CRVO central retinal vein occlusion; CRAO central retinal artery occlusion; BRAO branch retinal artery occlusion; RT retinal tear; SB scleral buckle; PPV pars plana vitrectomy; VH Vitreous hemorrhage; PRP panretinal laser photocoagulation; IVK intravitreal kenalog; VMT vitreomacular traction; MH Macular hole;  NVD neovascularization of the disc; NVE neovascularization elsewhere; AREDS age related eye disease study; ARMD age related macular degeneration; POAG primary open angle glaucoma; EBMD epithelial/anterior basement membrane dystrophy; ACIOL anterior chamber intraocular lens; IOL intraocular lens; PCIOL posterior chamber intraocular lens; Phaco/IOL phacoemulsification with intraocular lens placement; [place] photorefractive keratectomy; LASIK laser assisted in situ keratomileusis; HTN hypertension; DM diabetes mellitus; COPD chronic obstructive pulmonary disease

## 2018-11-17 ENCOUNTER — Encounter (INDEPENDENT_AMBULATORY_CARE_PROVIDER_SITE_OTHER): Payer: Self-pay | Admitting: Ophthalmology

## 2018-11-17 MED ORDER — AFLIBERCEPT 2MG/0.05ML IZ SOLN FOR KALEIDOSCOPE
2.0000 mg | INTRAVITREAL | Status: DC
  Administered 2018-11-17: 2 mg via INTRAVITREAL

## 2018-11-18 ENCOUNTER — Encounter: Payer: Self-pay | Admitting: Interventional Cardiology

## 2018-11-22 ENCOUNTER — Telehealth: Payer: Self-pay | Admitting: Pharmacist

## 2018-11-22 NOTE — Telephone Encounter (Signed)
Pt called clinic and reports that her itching has not improved. The hydroxyzine helped with her itching but made her tired so she stopped taking this. She is seeing her PCP this afternoon - advised her to discuss with PCP as rash has disappeared since stopping carvedilol weeks ago but itching is not, may be another underlying cause.

## 2018-11-25 MED ORDER — AMLODIPINE BESYLATE 5 MG PO TABS: 5.0000 mg | ORAL_TABLET | Freq: Every day | ORAL | 11 refills | Status: DC

## 2018-11-25 MED ORDER — HYDRALAZINE HCL 10 MG PO TABS: 10.0000 mg | ORAL_TABLET | Freq: Three times a day (TID) | ORAL | 11 refills | Status: DC

## 2018-11-25 NOTE — Addendum Note (Signed)
Addended by: Minta Fair E on: 11/25/2018 03:21 PM   Modules accepted: Orders

## 2018-11-25 NOTE — Addendum Note (Signed)
Addended by: Kayan Blissett E on: 11/25/2018 01:29 PM   Modules accepted: Orders

## 2018-11-25 NOTE — Telephone Encounter (Addendum)
Pt called back and states she previously took amlodipine but felt fatigued on this. She has multiple other intolerances to BP medications including clonidine (fatigue), carvedilol (full body rash), spironolactone (potentially rash), and HCTZ (hypokalemia). Will try hydralazine 10mg  TID instead.

## 2018-11-25 NOTE — Telephone Encounter (Signed)
Pt called clinic and states she has still been itching despite stopping carvedilol 11/01/18. Rash disappeared but she still has bumps on her back, shoulders, and chest. Will try stopping spironolactone to see if this is contributing. Home BP readings remain elevated at 140s/80s. Will start amlodipine 5mg  daily and continue losartan 100mg  daily and metoprolol tartrate 100mg  BID. Instructed her to keep f/u with Dr [name] at the end of the month.

## 2018-12-12 NOTE — Progress Notes (Signed)
[facility] Note  12/13/2018     CHIEF COMPLAINT Patient presents for Retina Follow Up   HISTORY OF PRESENT ILLNESS: Tammy Allison is a 84 y.o. female who presents to the clinic today for:   HPI    Retina Follow Up    In right eye.  This started 3 months ago.  Severity is mild.  Since onset it is stable.  I, the attending physician,  performed the HPI with the patient and updated documentation appropriately.          Comments    F/U EXU ARMD OD. Patient states her vision seems to be the same, "I did not have any floaters after last Eylea", denies new visual onsets/issues. Denies gtt's. Pt is ready for tx today if indicted.        Last edited by Bernarda Caffey, MD on 12/13/2018  9:21 AM. (History)    pt states she does not feel like the medication is helping her vision  Referring physician: [name], MD [place], [place] [address]  HISTORICAL INFORMATION:   Selected notes from the MEDICAL RECORD NUMBER Referred by Dr. Quentin Ore for exu ARMD OD LEE: 07.01.19 Read Drivers) [BCVA: OD: 20/40+2 OS: 20/25] Ocular Hx-non-exu ARMD OS, open angle glaucoma, pseudophakia OU, PVD, corneal guttata, miosis, asteroid hyalosis, posterior capsular opacification OU PMH-HTN, high cholesterol, thyroid disease    CURRENT MEDICATIONS: No current outpatient medications on file. (Ophthalmic Drugs)   Current Facility-Administered Medications (Ophthalmic Drugs)  Medication Route  . aflibercept (EYLEA) SOLN 2 mg Intravitreal  . aflibercept (EYLEA) SOLN 2 mg Intravitreal  . aflibercept (EYLEA) SOLN 2 mg Intravitreal  . aflibercept (EYLEA) SOLN 2 mg Intravitreal  . aflibercept (EYLEA) SOLN 2 mg Intravitreal   Current Outpatient Medications (Other)  Medication Sig  . cetirizine (ZYRTEC) 10 MG tablet Take 5 mg by mouth daily as needed for allergies.  . cholecalciferol (VITAMIN D) 1000 UNITS tablet Take 1,000 Units by mouth every morning.   .  Cyanocobalamin (B-12 PO) Take 1 tablet by mouth daily.  . hydrALAZINE (APRESOLINE) 10 MG tablet Take 1 tablet (10 mg total) by mouth 3 (three) times daily.  Marland Kitchen levothyroxine (SYNTHROID, LEVOTHROID) 75 MCG tablet Take 75 mcg by mouth daily before breakfast.  . losartan (COZAAR) 100 MG tablet Take 100 mg by mouth daily.  . metoprolol tartrate (LOPRESSOR) 50 MG tablet Take 100 mg by mouth 2 (two) times daily.  . Multiple Vitamins-Minerals (PRESERVISION AREDS 2 PO) Take 1 capsule by mouth daily.  . pantoprazole (PROTONIX) 40 MG tablet Take 40 mg by mouth every morning.   . Probiotic Product (ALIGN) 4 MG CAPS Take 4 mg by mouth daily.  Marland Kitchen tolterodine (DETROL LA) 4 MG 24 hr capsule Take 4 mg by mouth once.   Current Facility-Administered Medications (Other)  Medication Route  . Bevacizumab (AVASTIN) SOLN 1.25 mg Intravitreal  . Bevacizumab (AVASTIN) SOLN 1.25 mg Intravitreal  . Bevacizumab (AVASTIN) SOLN 1.25 mg Intravitreal      REVIEW OF SYSTEMS: ROS    Positive for: Eyes   Negative for: Constitutional, Gastrointestinal, Neurological, Skin, Genitourinary, Musculoskeletal, HENT, Endocrine, Cardiovascular, Respiratory, Psychiatric, Allergic/Imm, Heme/Lymph   Last edited by Zenovia Jordan, LPN on 1/91/4782  9:56 AM. (History)       ALLERGIES Allergies  Allergen Reactions  . Carvedilol     Full body rash  . Sulfa Antibiotics Hives    PAST MEDICAL HISTORY Past Medical History:  Diagnosis Date  .  Arthritis   . GERD (gastroesophageal reflux disease)   . H/O hiatal hernia   . Hyperlipidemia   . Hypertension   . Hypothyroidism   . Irritable bowel syndrome   . Macular degeneration    Exudative - OD   Past Surgical History:  Procedure Laterality Date  . CATARACT EXTRACTION    . EYE SURGERY Bilateral    cataract surgery w/ lens implant  . FRACTURE SURGERY     right hip  . LUMBAR LAMINECTOMY/DECOMPRESSION MICRODISCECTOMY Left 05/25/2014   Procedure: LUMBAR  LAMINECTOMY/DECOMPRESSION MICRODISCECTOMY 1 LEVEL   lumbar three;  Surgeon: Hosie Spangle, MD;  Location: [facility];  Service: Neurosurgery;  Laterality: Left;  . THYROID SURGERY      FAMILY HISTORY Family History  Problem Relation Age of Onset  . Macular degeneration Mother     SOCIAL HISTORY Social History   Tobacco Use  . Smoking status: Never Smoker  . Smokeless tobacco: Never Used  Substance Use Topics  . Alcohol use: No  . Drug use: Never         OPHTHALMIC EXAM:  Base Eye Exam    Visual Acuity (Snellen - Linear)      Right Left   Dist cc 20/50 20/25   Dist ph cc 20/30 NI   Correction:  Glasses       Tonometry (Tonopen, 9:09 AM)      Right Left   Pressure 14 14       Pupils      Dark Light Shape React APD   Right 3 2 Round Brisk None   Left 3 2 Round Brisk None       Visual Fields (Counting fingers)      Left Right    Full Full       Extraocular Movement      Right Left    Full, Ortho Full, Ortho       Neuro/Psych    Oriented x3:  Yes   Mood/Affect:  Normal       Dilation    Both eyes:  1.0% Mydriacyl, 2.5% Phenylephrine @ 9:06 AM        Slit Lamp and Fundus Exam    Slit Lamp Exam      Right Left   Lids/Lashes Dermatochalasis - upper lid Dermatochalasis - upper lid   Conjunctiva/Sclera White and quiet White and quiet   Cornea Arcus Arcus   Anterior Chamber Deep and quiet Deep and quiet   Iris Round and dilated Round and dilated   Lens PC IOL in good position with open PC PC IOL in good position with open PC   Vitreous Vitreous syneresis, Posterior vitreous detachment Vitreous syneresis, Posterior vitreous detachment       Fundus Exam      Right Left   Disc Pink and Sharp Pink and Sharp   C/D Ratio 0.3 0.4   Macula Large central PEDs with mild persistent SRF overlying pigment mottling and clumping, Drusen Blunted foveal reflex, +PED, Drusen, Retinal pigment epithelial mottling and clumping, no heme   Vessels Vascular  attenuation Vascular attenuation   Periphery Attached Normal          IMAGING AND PROCEDURES  Imaging and Procedures for @TODAY @  OCT, Retina - OU - Both Eyes       Right Eye Quality was good. Central Foveal Thickness: 360. Progression has been stable. Findings include abnormal foveal contour, no IRF, pigment epithelial detachment, retinal drusen , subretinal fluid, outer retinal  atrophy (+SRF overlying PED).   Left Eye Quality was good. Central Foveal Thickness: 281. Progression has been stable. Findings include normal foveal contour, pigment epithelial detachment, no IRF, no SRF.   Notes *Images captured and stored on drive  Diagnosis / Impression:  OD: Exudative ARMD -- mild SRF overlying PED -- stable OS: Non-Exudative ARMD w/ stable PEDs  Clinical management:  See below  Abbreviations: NFP - Normal foveal profile. CME - cystoid macular edema. PED - pigment epithelial detachment. IRF - intraretinal fluid. SRF - subretinal fluid. EZ - ellipsoid zone. ERM - epiretinal membrane. ORA - outer retinal atrophy. ORT - outer retinal tubulation. SRHM - subretinal hyper-reflective material         Intravitreal Injection, Pharmacologic Agent - OD - Right Eye       Time Out 12/13/2018. 9:54 AM. Confirmed correct patient, procedure, site, and patient consented.   Anesthesia Topical anesthesia was used. Anesthetic medications included Lidocaine 2%, Proparacaine 0.5%.   Procedure Preparation included 5% betadine to ocular surface, eyelid speculum. A 30 gauge needle was used.   Injection:  2 mg aflibercept Alfonse Flavors) SOLN   NDC: [id number], Lot: [id number], Expiration date: 10/17/2019   Route: Intravitreal, Site: Right Eye, Waste: 0.05 mL  Post-op Post injection exam found visual acuity of at least counting fingers. The patient tolerated the procedure well. There were no complications. The patient received written and verbal post procedure care education.                  ASSESSMENT/PLAN:    ICD-10-CM   1. Exudative age-related macular degeneration of right eye with active choroidal neovascularization (HCC) H35.3211 Intravitreal Injection, Pharmacologic Agent - OD - Right Eye    aflibercept (EYLEA) SOLN 2 mg  2. 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 Retinal edema H35.81 OCT, Retina - OU - Both Eyes  4. Posterior vitreous detachment of both eyes H43.813   5. Pseudophakia of both eyes Z96.1   6. PCO (posterior capsular opacification), bilateral H26.493     1,3. Exudative age related macular degeneration, OD              - initial BCVA OD 20/30             - initial OCT w/ PEDs OU (OD > OS); mild SRF overlying PED OD  - initial FA with mild CNVM OD  - S/P IVA OD #1 (07.12.19), #2 (08.12.19), #3 (09.09.19)  - S/P IVE #1 OD (10.07.19), #2 (11.04.19), #3 (12.02.19), #4 (12.30.19)  - today, BCVA w/ stable at 20/30 and OCT shows no significant change in SRF overlying PEDs  - discussed holding vs treat and extend as injections have had little improvement in OCT and BCVA since starting  - pt wishes to treat and extend -- recommend IVE #5 OD today (01.27.20)  - pt wishes to be treated with IVE w/ extension to 6 wks  - RBA of procedure discussed, questions answered  - informed consent obtained and signed  - see procedure note  - Eylea paperwork signed and benefits investigation started on 09.09.19 -- approved for Good Days 2020  - f/u in 6 weeks -- DFE/OCT/possible injection  2. Age related macular degeneration, non-exudative, OS  - The incidence, anatomy, and pathology of dry AMD, risk of progression, and the AREDS and AREDS 2 study including smoking risks discussed with patient  - continue amsler grid monitoring  4. PVD / vitreous syneresis OU  Discussed findings  and prognosis  No RT or RD on 360 scleral depressed exam  Reviewed s/s of RT/RD  Strict return precautions for any such RT/RD  signs/symptoms  5. Pseudophakia OU  - s/p CE/IOL OU  - beautiful surgeries, doing well  - monitor  6. PCO OU (OS > OD) - s/p YAG cap OU (09/2018) w/ Dr. Kathlen Mody - doing well, VA improved OU   Ophthalmic Meds Ordered this visit:  Meds ordered this encounter  Medications  . aflibercept (EYLEA) SOLN 2 mg       Return in about 6 weeks (around 01/24/2019) for f/u exu ARMD OD, DFE, OCT.  There are no Patient Instructions on file for this visit.   Explained the diagnoses, plan, and follow up with the patient and they expressed understanding.  Patient expressed understanding of the importance of proper follow up care.   This document serves as a record of services personally performed by Gardiner Sleeper, MD, PhD. It was created on their behalf by Ernest Mallick, OA, an ophthalmic assistant. The creation of this record is the provider's dictation and/or activities during the visit.    Electronically signed by: Ernest Mallick, OA  01.26.2020 9:57 AM    Gardiner Sleeper, M.D., Ph.D. Diseases & Surgery of the Retina and Vitreous [facility]  I have reviewed the above documentation for accuracy and completeness, and I agree with the above. Gardiner Sleeper, M.D., Ph.D. 12/13/18 9:59 AM    Abbreviations: M myopia (nearsighted); A astigmatism; H hyperopia (farsighted); P presbyopia; Mrx spectacle prescription;  CTL contact lenses; OD right eye; OS left eye; OU both eyes  XT exotropia; ET esotropia; PEK punctate epithelial keratitis; PEE punctate epithelial erosions; DES dry eye syndrome; MGD meibomian gland dysfunction; ATs artificial tears; PFAT's preservative free artificial tears; [place] nuclear sclerotic cataract; PSC posterior subcapsular cataract; ERM epi-retinal membrane; PVD posterior vitreous detachment; RD retinal detachment; DM diabetes mellitus; DR diabetic retinopathy; NPDR non-proliferative diabetic retinopathy; PDR proliferative diabetic retinopathy; CSME  clinically significant macular edema; DME diabetic macular edema; dbh dot blot hemorrhages; CWS cotton wool spot; POAG primary open angle glaucoma; C/D cup-to-disc ratio; HVF humphrey visual field; GVF goldmann visual field; OCT optical coherence tomography; IOP intraocular pressure; BRVO Branch retinal vein occlusion; CRVO central retinal vein occlusion; CRAO central retinal artery occlusion; BRAO branch retinal artery occlusion; RT retinal tear; SB scleral buckle; PPV pars plana vitrectomy; VH Vitreous hemorrhage; PRP panretinal laser photocoagulation; IVK intravitreal kenalog; VMT vitreomacular traction; MH Macular hole;  NVD neovascularization of the disc; NVE neovascularization elsewhere; AREDS age related eye disease study; ARMD age related macular degeneration; POAG primary open angle glaucoma; EBMD epithelial/anterior basement membrane dystrophy; ACIOL anterior chamber intraocular lens; IOL intraocular lens; PCIOL posterior chamber intraocular lens; Phaco/IOL phacoemulsification with intraocular lens placement; [place] photorefractive keratectomy; LASIK laser assisted in situ keratomileusis; HTN hypertension; DM diabetes mellitus; COPD chronic obstructive pulmonary disease

## 2018-12-13 ENCOUNTER — Encounter (INDEPENDENT_AMBULATORY_CARE_PROVIDER_SITE_OTHER): Payer: Self-pay | Admitting: Ophthalmology

## 2018-12-13 ENCOUNTER — Ambulatory Visit (INDEPENDENT_AMBULATORY_CARE_PROVIDER_SITE_OTHER): Payer: Medicare Other | Admitting: Ophthalmology

## 2018-12-13 DIAGNOSIS — H3581 Retinal edema: Secondary | ICD-10-CM | POA: Diagnosis not present

## 2018-12-13 DIAGNOSIS — H43813 Vitreous degeneration, bilateral: Secondary | ICD-10-CM | POA: Diagnosis not present

## 2018-12-13 DIAGNOSIS — H353122 Nonexudative age-related macular degeneration, left eye, intermediate dry stage: Secondary | ICD-10-CM | POA: Diagnosis not present

## 2018-12-13 DIAGNOSIS — H26493 Other secondary cataract, bilateral: Secondary | ICD-10-CM

## 2018-12-13 DIAGNOSIS — H353211 Exudative age-related macular degeneration, right eye, with active choroidal neovascularization: Secondary | ICD-10-CM | POA: Diagnosis not present

## 2018-12-13 DIAGNOSIS — Z961 Presence of intraocular lens: Secondary | ICD-10-CM

## 2018-12-13 MED ORDER — AFLIBERCEPT 2MG/0.05ML IZ SOLN FOR KALEIDOSCOPE
2.0000 mg | INTRAVITREAL | Status: DC
  Administered 2018-12-13: 2 mg via INTRAVITREAL

## 2018-12-14 NOTE — Progress Notes (Signed)
Cardiology Office Note:    Date:  12/15/2018   ID:  Tammy Allison, DOB 1935-08-25, MRN [id number]  PCP:  [name], MD  Cardiologist:  Sinclair Grooms, MD   Referring MD: [name], MD   Chief Complaint  Patient presents with  . Hypertension    History of Present Illness:    Tammy Allison is a 84 y.o. female with a hx of retention, hyperlipidemia, gastroesophageal reflux, and palpitations.  She feels tired all the time.  No chest pain or dyspnea.  This is been going on greater than a year.  Was worse over the past several months when her husband was in the hospital.  She is sleeping okay.  Blood pressure is still not well controlled.  Past Medical History:  Diagnosis Date  . Arthritis   . GERD (gastroesophageal reflux disease)   . H/O hiatal hernia   . Hyperlipidemia   . Hypertension   . Hypothyroidism   . Irritable bowel syndrome   . Macular degeneration    Exudative - OD    Past Surgical History:  Procedure Laterality Date  . CATARACT EXTRACTION    . EYE SURGERY Bilateral    cataract surgery w/ lens implant  . FRACTURE SURGERY     right hip  . LUMBAR LAMINECTOMY/DECOMPRESSION MICRODISCECTOMY Left 05/25/2014   Procedure: LUMBAR LAMINECTOMY/DECOMPRESSION MICRODISCECTOMY 1 LEVEL   lumbar three;  Surgeon: Hosie Spangle, MD;  Location: [facility];  Service: Neurosurgery;  Laterality: Left;  . THYROID SURGERY      Current Medications: Current Meds  Medication Sig  . cetirizine (ZYRTEC) 10 MG tablet Take 5 mg by mouth daily as needed for allergies.  . cholecalciferol (VITAMIN D) 1000 UNITS tablet Take 1,000 Units by mouth every morning.   . Cyanocobalamin (B-12 PO) Take 1 tablet by mouth daily.  Marland Kitchen escitalopram (LEXAPRO) 5 MG tablet Take 5 mg by mouth daily.  . hydrALAZINE (APRESOLINE) 10 MG tablet Take 1 tablet (10 mg total) by mouth 3 (three) times daily.  Marland Kitchen levothyroxine (SYNTHROID, LEVOTHROID) 75 MCG tablet Take 75 mcg by mouth daily before  breakfast.  . losartan (COZAAR) 100 MG tablet Take 100 mg by mouth daily.  . metoprolol tartrate (LOPRESSOR) 50 MG tablet Take 100 mg by mouth 2 (two) times daily.  . Multiple Vitamins-Minerals (PRESERVISION AREDS 2 PO) Take 1 capsule by mouth daily.  . pantoprazole (PROTONIX) 40 MG tablet Take 40 mg by mouth every morning.   . Probiotic Product (ALIGN) 4 MG CAPS Take 4 mg by mouth daily.  Marland Kitchen tolterodine (DETROL LA) 4 MG 24 hr capsule Take 4 mg by mouth once.   Current Facility-Administered Medications for the 12/15/18 encounter (Office Visit) with Belva Crome, MD  Medication  . aflibercept (EYLEA) SOLN 2 mg  . aflibercept (EYLEA) SOLN 2 mg  . aflibercept (EYLEA) SOLN 2 mg  . aflibercept (EYLEA) SOLN 2 mg  . aflibercept (EYLEA) SOLN 2 mg  . Bevacizumab (AVASTIN) SOLN 1.25 mg  . Bevacizumab (AVASTIN) SOLN 1.25 mg  . Bevacizumab (AVASTIN) SOLN 1.25 mg     Allergies:   Carvedilol and Sulfa antibiotics   Social History   Socioeconomic History  . Marital status: Married    Spouse name: Not on file  . Number of children: Not on file  . Years of education: Not on file  . Highest education level: Not on file  Occupational History  . Not on file  Social Needs  . Financial  resource strain: Not on file  . Food insecurity:    Worry: Not on file    Inability: Not on file  . Transportation needs:    Medical: Not on file    Non-medical: Not on file  Tobacco Use  . Smoking status: Never Smoker  . Smokeless tobacco: Never Used  Substance and Sexual Activity  . Alcohol use: No  . Drug use: Never  . Sexual activity: Not on file  Lifestyle  . Physical activity:    Days per week: Not on file    Minutes per session: Not on file  . Stress: Not on file  Relationships  . Social connections:    Talks on phone: Not on file    Gets together: Not on file    Attends religious service: Not on file    Active member of club or organization: Not on file    Attends meetings of clubs or  organizations: Not on file    Relationship status: Not on file  Other Topics Concern  . Not on file  Social History Narrative  . Not on file     Family History: The patient's family history includes Macular degeneration in her mother.  ROS:   Please see the history of present illness.    Sleeping better all other systems reviewed and are negative.  EKGs/Labs/Other Studies Reviewed:    The following studies were reviewed today: No new data  EKG:  EKG not repeated  Recent Labs: 11/03/2018: BUN 14; Creatinine, Ser 1.09; Potassium 4.1; Sodium 131  Recent Lipid Panel No results found for: CHOL, TRIG, HDL, CHOLHDL, VLDL, LDLCALC, LDLDIRECT  Physical Exam:    VS:  BP (!) 162/78   Pulse 71   Ht 5' 4.5" (1.638 m)   Wt 141 lb 12.8 oz (64.3 kg)   SpO2 98%   BMI 23.96 kg/m     Wt Readings from Last 3 Encounters:  12/15/18 141 lb 12.8 oz (64.3 kg)  09/15/18 146 lb 12.8 oz (66.6 kg)  05/29/14 131 lb 8 oz (59.6 kg)     GEN: Appears younger than stated age. No acute distress HEENT: Normal NECK: No JVD. LYMPHATICS: No lymphadenopathy CARDIAC: RRR.  No murmur, no gallop, no edema VASCULAR: 2+ bilateral radial and carotid pulses, no bruits RESPIRATORY:  Clear to auscultation without rales, wheezing or rhonchi  ABDOMEN: Soft, non-tender, non-distended, No pulsatile mass, MUSCULOSKELETAL: No deformity  SKIN: Warm and dry NEUROLOGIC:  Alert and oriented x 3 PSYCHIATRIC:  Normal affect   ASSESSMENT:    1. Benign essential hypertension   2. Hyperlipidemia LDL goal <70   3. Snoring    PLAN:    In order of problems listed above:  1. Still very poor blood pressure control.  Hypokalemia with hydrochlorothiazide.  Pruritus with spironolactone.  Rash and pruritus with carvedilol.  Hydralazine has been started but this is a temporizing measure.  Eplerenone 25 mg daily is started and we may increase to 50 mg/day if she tolerates the dose.This is particularly relevant in this setting  with resistant blood pressure.  We may also need to do renal duplex given tendency towards hypokalemia.  Once we are taking 4 medications that include a diuretic, if blood pressure is not controlled renal artery stenosis will need to be excluded. 2. Not addressed 3. History of snoring.  In the context of fatigue, primary care should consider doing a sleep study.     Basic metabolic panel in 1 week.  Blood pressure clinic  in 1 month.  Consider renal artery duplex to exclude stenosis.    Medication Adjustments/Labs and Tests Ordered: Current medicines are reviewed at length with the patient today.  Concerns regarding medicines are outlined above.  Orders Placed This Encounter  Procedures  . Basic metabolic panel   Meds ordered this encounter  Medications  . eplerenone (INSPRA) 25 MG tablet    Sig: Take 1 tablet (25 mg total) by mouth daily.    Dispense:  30 tablet    Refill:  6    Patient Instructions  Medication Instructions:  Your physician has recommended you make the following change in your medication:  1.) start eplerenone (INSPRA) 25 mg once a day   If you need a refill on your cardiac medications before your next appointment, please call your pharmacy.   Lab work: BMET in 1 week.  12/22/18 If you have labs (blood work) drawn today and your tests are completely normal, you will receive your results only by: Marland Kitchen MyChart Message (if you have MyChart) OR . A paper copy in the mail If you have any lab test that is abnormal or we need to change your treatment, we will call you to review the results.  Testing/Procedures:   Follow-Up:  Your physician recommends that you schedule a follow-up appointment in: 1 month with BP clinic (PharmD)  At [facility], you and your health needs are our priority.  As part of our continuing mission to provide you with exceptional heart care, we have created designated Provider Care Teams.  These Care Teams include your primary Cardiologist  (physician) and Advanced Practice Providers (APPs -  Physician Assistants and Nurse Practitioners) who all work together to provide you with the care you need, when you need it. You will need a follow up appointment in 3 months.  Please call our office 2 months in advance to schedule this appointment.  You may see Sinclair Grooms, MD or one of the following Advanced Practice Providers on your designated Care Team:   Truitt Merle, NP Cecilie Kicks, NP . Kathyrn Drown, NP  Any Other Special Instructions Will Be Listed Below (If Applicable).       Signed, Sinclair Grooms, MD  12/15/2018 10:30 AM    [place]

## 2018-12-15 ENCOUNTER — Ambulatory Visit (INDEPENDENT_AMBULATORY_CARE_PROVIDER_SITE_OTHER): Payer: Medicare Other | Admitting: Interventional Cardiology

## 2018-12-15 ENCOUNTER — Encounter: Payer: Self-pay | Admitting: Interventional Cardiology

## 2018-12-15 VITALS — BP 162/78 | HR 71 | Ht 64.5 in | Wt 141.8 lb

## 2018-12-15 DIAGNOSIS — I10 Essential (primary) hypertension: Secondary | ICD-10-CM | POA: Diagnosis not present

## 2018-12-15 DIAGNOSIS — E785 Hyperlipidemia, unspecified: Secondary | ICD-10-CM | POA: Diagnosis not present

## 2018-12-15 DIAGNOSIS — R0683 Snoring: Secondary | ICD-10-CM

## 2018-12-15 MED ORDER — EPLERENONE 25 MG PO TABS: 25.0000 mg | ORAL_TABLET | Freq: Every day | ORAL | 6 refills | Status: DC

## 2018-12-15 NOTE — Patient Instructions (Signed)
Medication Instructions:  Your physician has recommended you make the following change in your medication:  1.) start eplerenone (INSPRA) 25 mg once a day   If you need a refill on your cardiac medications before your next appointment, please call your pharmacy.   Lab work: BMET in 1 week.  12/22/18 If you have labs (blood work) drawn today and your tests are completely normal, you will receive your results only by: Marland Kitchen MyChart Message (if you have MyChart) OR . A paper copy in the mail If you have any lab test that is abnormal or we need to change your treatment, we will call you to review the results.  Testing/Procedures:   Follow-Up:  Your physician recommends that you schedule a follow-up appointment in: 1 month with BP clinic (PharmD)  At [facility], you and your health needs are our priority.  As part of our continuing mission to provide you with exceptional heart care, we have created designated Provider Care Teams.  These Care Teams include your primary Cardiologist (physician) and Advanced Practice Providers (APPs -  Physician Assistants and Nurse Practitioners) who all work together to provide you with the care you need, when you need it. You will need a follow up appointment in 3 months.  Please call our office 2 months in advance to schedule this appointment.  You may see Sinclair Grooms, MD or one of the following Advanced Practice Providers on your designated Care Team:   Truitt Merle, NP Cecilie Kicks, NP . Kathyrn Drown, NP  Any Other Special Instructions Will Be Listed Below (If Applicable).

## 2018-12-22 ENCOUNTER — Other Ambulatory Visit: Payer: Medicare Other | Admitting: *Deleted

## 2018-12-22 DIAGNOSIS — I10 Essential (primary) hypertension: Secondary | ICD-10-CM

## 2018-12-23 LAB — BASIC METABOLIC PANEL
BUN/Creatinine Ratio: 13 (ref 12–28)
BUN: 11 mg/dL (ref 8–27)
CO2: 25 mmol/L (ref 20–29)
Calcium: 8.7 mg/dL (ref 8.7–10.3)
Chloride: 96 mmol/L (ref 96–106)
Creatinine, Ser: 0.86 mg/dL (ref 0.57–1.00)
GFR calc Af Amer: 73 mL/min/{1.73_m2} (ref >59)
GFR calc non Af Amer: 64 mL/min/{1.73_m2} (ref >59)
Glucose: 72 mg/dL (ref 65–99)
Potassium: 3.8 mmol/L (ref 3.5–5.2)
Sodium: 134 mmol/L (ref 134–144)

## 2018-12-24 ENCOUNTER — Telehealth: Payer: Self-pay | Admitting: *Deleted

## 2018-12-24 DIAGNOSIS — I10 Essential (primary) hypertension: Secondary | ICD-10-CM

## 2018-12-24 NOTE — Telephone Encounter (Signed)
-----   Message from Belva Crome, MD sent at 12/23/2018  5:04 PM EST ----- Let the patient know that so far, blood work is good on the current medical regimen.  Sodium is 131.  Potassium is not too high.  Repeat basic metabolic panel in 1 month. A copy will be sent to [name], MD

## 2018-12-24 NOTE — Telephone Encounter (Signed)
Spoke with pt and went over lab results and recommendations per Dr. [name].  Pt will come for repeat BMET on 3/6.  Pt verbalized understanding and was in agreement with this plan.

## 2019-01-03 ENCOUNTER — Telehealth: Payer: Self-pay | Admitting: Pharmacist

## 2019-01-03 NOTE — Telephone Encounter (Signed)
Pt called clinic to report nausea and eating less since starting eplerenone. States she cannot continue to take the eplerenone. Will stop eplerenone and increase hydralazine to 20mg  TID (pt still has a lot of the 10mg  tablets left). She will keep f/u in HTN clinic next week.

## 2019-01-12 ENCOUNTER — Ambulatory Visit (INDEPENDENT_AMBULATORY_CARE_PROVIDER_SITE_OTHER): Payer: Medicare Other | Admitting: Pharmacist

## 2019-01-12 VITALS — BP 142/72 | HR 68

## 2019-01-12 DIAGNOSIS — I10 Essential (primary) hypertension: Secondary | ICD-10-CM | POA: Diagnosis not present

## 2019-01-12 MED ORDER — METOPROLOL TARTRATE 100 MG PO TABS: 100.0000 mg | ORAL_TABLET | Freq: Two times a day (BID) | ORAL | 3 refills | Status: DC

## 2019-01-12 MED ORDER — HYDRALAZINE HCL 50 MG PO TABS: 50.0000 mg | ORAL_TABLET | Freq: Three times a day (TID) | ORAL | 11 refills | Status: DC

## 2019-01-12 NOTE — Progress Notes (Signed)
Patient ID: [name]                 DOB: 19-Nov-1934                      MRN: [id number]     HPI: Tammy Allison is a 84 y.o. female referred by Dr. [name] to HTN clinic. PMH is significant for HTN, HLD, aortic atherosclerosis seen on CT scan in 2015, and GERD. Pt has been seen in HTN clinic multiple times with difficult to control BP due to multitude of BP intolerances. She experienced hypokalemia on HCTZ, full body rash on carvedilol and ? Spironolactone, and fatigue with clonidine and amlodipine. She has also reported chronic fatigue over the past 1+ year. She was started on hydralazine 10mg  TID. She saw Dr [name] a month ago and was started on eplerenone. She then called clinic 2-3 weeks later with reports of nausea and eating less. She attributed this to her eplerenone and wished to stop therapy. Hydralazine was increased to 20mg  TID (using up her 10mg  tablets).  Pt reports today in distress. Reports she has lost her quality of life due to chronic fatigue recently. She gets very fatigued cooking and walking from room to room. She has not been to church or out to eat in the past few weeks. Pt reports sleeping very well at night.  In better news, her husband's health has improved and she is less stressed because of that. Her nausea has improved since stopping eplerenone (also reports her copay was $135 for a 3 month supply).  Home BP readings 932-671 range systolic. BP cuff previously brought to clinic and home readings correlated well with clinic readings. She took her morning BP medications at 7:30am today.  Current HTN meds: hydralazine 20mg  TID, losartan 100mg  daily (PM), metoprolol tartrate 100mg  BID  Previously tried: HCTZ - hypokalemia, carvedilol - full body rash, clonidine -fatigue, amlodipine - fatigue, spironolactone - potential rash, eplerenone - nausea  BP goal: <140/30mmHg per Dr [name]  Family History: Non-contributory  Social History: Denies tobacco, alcohol, and  illicit drug use.  Diet: Husband has been sick, not cooking as much. Does not add salt to her food. 1 cup of coffee each morning. Does like snacking on ice cream.  Exercise: Some walking, however has chronic fatigue for a while.   Wt Readings from Last 3 Encounters:  12/15/18 141 lb 12.8 oz (64.3 kg)  09/15/18 146 lb 12.8 oz (66.6 kg)  05/29/14 131 lb 8 oz (59.6 kg)   BP Readings from Last 3 Encounters:  12/15/18 (!) 162/78  11/03/18 120/82  10/20/18 (!) 168/84   Pulse Readings from Last 3 Encounters:  12/15/18 71  11/03/18 72  10/20/18 87    Renal function: CrCl cannot be calculated (Unknown ideal weight.).  Past Medical History:  Diagnosis Date  . Arthritis   . GERD (gastroesophageal reflux disease)   . H/O hiatal hernia   . Hyperlipidemia   . Hypertension   . Hypothyroidism   . Irritable bowel syndrome   . Macular degeneration    Exudative - OD    Current Outpatient Medications on File Prior to Visit  Medication Sig Dispense Refill  . cetirizine (ZYRTEC) 10 MG tablet Take 5 mg by mouth daily as needed for allergies.    . cholecalciferol (VITAMIN D) 1000 UNITS tablet Take 1,000 Units by mouth every morning.     . Cyanocobalamin (B-12 PO) Take 1 tablet by mouth daily.    Marland Kitchen  escitalopram (LEXAPRO) 5 MG tablet Take 5 mg by mouth daily.    . hydrALAZINE (APRESOLINE) 10 MG tablet Take 2 tablets (20 mg total) by mouth 3 (three) times daily. 90 tablet 11  . levothyroxine (SYNTHROID, LEVOTHROID) 75 MCG tablet Take 75 mcg by mouth daily before breakfast.    . losartan (COZAAR) 100 MG tablet Take 100 mg by mouth daily.    . metoprolol tartrate (LOPRESSOR) 50 MG tablet Take 100 mg by mouth 2 (two) times daily. 180 tablet 3  . Multiple Vitamins-Minerals (PRESERVISION AREDS 2 PO) Take 1 capsule by mouth daily.    . pantoprazole (PROTONIX) 40 MG tablet Take 40 mg by mouth every morning.     . Probiotic Product (ALIGN) 4 MG CAPS Take 4 mg by mouth daily.    Marland Kitchen tolterodine (DETROL  LA) 4 MG 24 hr capsule Take 4 mg by mouth once.  0   Current Facility-Administered Medications on File Prior to Visit  Medication Dose Route Frequency Provider Last Rate Last Dose  . aflibercept (EYLEA) SOLN 2 mg  2 mg Intravitreal  Bernarda Caffey, MD   2 mg at 08/24/18 6314  . aflibercept (EYLEA) SOLN 2 mg  2 mg Intravitreal  Bernarda Caffey, MD   2 mg at 09/20/18 1118  . aflibercept (EYLEA) SOLN 2 mg  2 mg Intravitreal  Bernarda Caffey, MD   2 mg at 10/18/18 1133  . aflibercept (EYLEA) SOLN 2 mg  2 mg Intravitreal  Bernarda Caffey, MD   2 mg at 11/17/18 1420  . aflibercept (EYLEA) SOLN 2 mg  2 mg Intravitreal  Bernarda Caffey, MD   2 mg at 12/13/18 0955  . Bevacizumab (AVASTIN) SOLN 1.25 mg  1.25 mg Intravitreal  Bernarda Caffey, MD   1.25 mg at 05/28/18 1530  . Bevacizumab (AVASTIN) SOLN 1.25 mg  1.25 mg Intravitreal  Bernarda Caffey, MD   1.25 mg at 06/28/18 0939  . Bevacizumab (AVASTIN) SOLN 1.25 mg  1.25 mg Intravitreal  Bernarda Caffey, MD   1.25 mg at 07/26/18 1000    Allergies  Allergen Reactions  . Carvedilol     Full body rash  . Sulfa Antibiotics Hives     Assessment/Plan:  1. Hypertension - BP much improved and is very close to goal <140/58mmHg per Dr [name]. Home readings remain a bit elevated with systolic readings [id number]. BP has been difficult to control due to many intolerances including carvedilol (full body rash), spironolactone (?rash), eplerenone (nausea), clonidine (fatigue), amlodipine (fatigue), and HCTZ (hypokalemia). Pt's biggest complaint is worsening fatigue that is affecting her quality of life. Discussed with Dr [name] today - will increase hydralazine to 50mg  TID and continue losartan 100mg  daily and metoprolol tartrate 100mg  BID. Encouraged pt to call PCP for more comprehensive work up for fatigue. Pt will follow up in HTN clinic in 1 month.   Megan E. Supple, PharmD, BCACP, [facility] [address] [place], [place] [address] Phone: [phone]; Fax: [phone] 01/12/2019 7:32 AM

## 2019-01-12 NOTE — Patient Instructions (Signed)
It was nice to see you today  Increase your hydralazine to 30mg  (3 tablets of the 10mg ), 3 times a day through Friday.  Then, pick up your new prescription for hydralazine 50mg  tablets and start taking 1 tablet 3 times a day  Continue taking your other medications  Call your primary care doctor about your fatigue  Follow up in clinic in 1 month for a blood pressure check

## 2019-01-17 DIAGNOSIS — E559 Vitamin D deficiency, unspecified: Secondary | ICD-10-CM | POA: Diagnosis not present

## 2019-01-17 DIAGNOSIS — Z6824 Body mass index (BMI) 24.0-24.9, adult: Secondary | ICD-10-CM | POA: Diagnosis not present

## 2019-01-17 DIAGNOSIS — E538 Deficiency of other specified B group vitamins: Secondary | ICD-10-CM | POA: Diagnosis not present

## 2019-01-17 DIAGNOSIS — Z79899 Other long term (current) drug therapy: Secondary | ICD-10-CM | POA: Diagnosis not present

## 2019-01-17 DIAGNOSIS — R5383 Other fatigue: Secondary | ICD-10-CM | POA: Diagnosis not present

## 2019-01-20 NOTE — Progress Notes (Signed)
[facility] Note  01/24/2019     CHIEF COMPLAINT Patient presents for Retina Follow Up   HISTORY OF PRESENT ILLNESS: Tammy Allison is a 84 y.o. female who presents to the clinic today for:   HPI    Retina Follow Up    Patient presents with  Wet AMD.  In right eye.  Severity is moderate.  Duration of 6 weeks.  Since onset it is stable.  I, the attending physician,  performed the HPI with the patient and updated documentation appropriately.          Comments    Patient states vision the same OU.        Last edited by Bernarda Caffey, MD on 01/24/2019 10:51 AM. (History)    pt states she feels like her vision is about the same  Referring physician: [name], MD [place], [place] [address]  HISTORICAL INFORMATION:   Selected notes from the MEDICAL RECORD NUMBER Referred by Dr. Quentin Ore for exu ARMD OD LEE: 07.01.19 Read Drivers) [BCVA: OD: 20/40+2 OS: 20/25] Ocular Hx-non-exu ARMD OS, open angle glaucoma, pseudophakia OU, PVD, corneal guttata, miosis, asteroid hyalosis, posterior capsular opacification OU PMH-HTN, high cholesterol, thyroid disease    CURRENT MEDICATIONS: No current outpatient medications on file. (Ophthalmic Drugs)   Current Facility-Administered Medications (Ophthalmic Drugs)  Medication Route  . aflibercept (EYLEA) SOLN 2 mg Intravitreal  . aflibercept (EYLEA) SOLN 2 mg Intravitreal  . aflibercept (EYLEA) SOLN 2 mg Intravitreal  . aflibercept (EYLEA) SOLN 2 mg Intravitreal  . aflibercept (EYLEA) SOLN 2 mg Intravitreal  . aflibercept (EYLEA) SOLN 2 mg Intravitreal   Current Outpatient Medications (Other)  Medication Sig  . cetirizine (ZYRTEC) 10 MG tablet Take 5 mg by mouth daily as needed for allergies.  . cholecalciferol (VITAMIN D) 1000 UNITS tablet Take 1,000 Units by mouth every morning.   . Cyanocobalamin (B-12 PO) Take 1 tablet by mouth daily.  Marland Kitchen escitalopram (LEXAPRO) 5 MG tablet Take 5 mg  by mouth daily.  . hydrALAZINE (APRESOLINE) 50 MG tablet Take 1 tablet (50 mg total) by mouth 3 (three) times daily.  Marland Kitchen levothyroxine (SYNTHROID, LEVOTHROID) 75 MCG tablet Take 88 mcg by mouth daily before breakfast.   . losartan (COZAAR) 100 MG tablet Take 100 mg by mouth daily.  . metoprolol tartrate (LOPRESSOR) 100 MG tablet Take 1 tablet (100 mg total) by mouth 2 (two) times daily.  . Multiple Vitamins-Minerals (PRESERVISION AREDS 2 PO) Take 1 capsule by mouth daily.  . pantoprazole (PROTONIX) 40 MG tablet Take 40 mg by mouth every morning.   . Probiotic Product (ALIGN) 4 MG CAPS Take 4 mg by mouth daily.  Marland Kitchen tolterodine (DETROL LA) 4 MG 24 hr capsule Take 4 mg by mouth once.   Current Facility-Administered Medications (Other)  Medication Route  . Bevacizumab (AVASTIN) SOLN 1.25 mg Intravitreal  . Bevacizumab (AVASTIN) SOLN 1.25 mg Intravitreal  . Bevacizumab (AVASTIN) SOLN 1.25 mg Intravitreal      REVIEW OF SYSTEMS: ROS    Positive for: Eyes   Negative for: Constitutional, Gastrointestinal, Neurological, Skin, Genitourinary, Musculoskeletal, HENT, Endocrine, Cardiovascular, Respiratory, Psychiatric, Allergic/Imm, Heme/Lymph   Last edited by Roselee Nova D on 01/24/2019  9:54 AM. (History)       ALLERGIES Allergies  Allergen Reactions  . Carvedilol     Full body rash  . Sulfa Antibiotics Hives    PAST MEDICAL HISTORY Past Medical History:  Diagnosis Date  .  Arthritis   . GERD (gastroesophageal reflux disease)   . H/O hiatal hernia   . Hyperlipidemia   . Hypertension   . Hypothyroidism   . Irritable bowel syndrome   . Macular degeneration    Exudative - OD   Past Surgical History:  Procedure Laterality Date  . CATARACT EXTRACTION    . EYE SURGERY Bilateral    cataract surgery w/ lens implant  . FRACTURE SURGERY     right hip  . LUMBAR LAMINECTOMY/DECOMPRESSION MICRODISCECTOMY Left 05/25/2014   Procedure: LUMBAR LAMINECTOMY/DECOMPRESSION MICRODISCECTOMY 1 LEVEL    lumbar three;  Surgeon: Hosie Spangle, MD;  Location: [facility];  Service: Neurosurgery;  Laterality: Left;  . THYROID SURGERY      FAMILY HISTORY Family History  Problem Relation Age of Onset  . Macular degeneration Mother     SOCIAL HISTORY Social History   Tobacco Use  . Smoking status: Never Smoker  . Smokeless tobacco: Never Used  Substance Use Topics  . Alcohol use: No  . Drug use: Never         OPHTHALMIC EXAM:  Base Eye Exam    Visual Acuity (Snellen - Linear)      Right Left   Dist cc 20/60 -2 20/20   Dist ph cc 20/40 +2    Correction:  Glasses       Tonometry (Tonopen, 10:02 AM)      Right Left   Pressure 15 15       Pupils      Dark Light Shape React APD   Right 3 2 Round Brisk None   Left 3 2 Round Brisk None       Visual Fields (Counting fingers)      Left Right    Full Full       Extraocular Movement      Right Left    Full, Ortho Full, Ortho       Neuro/Psych    Oriented x3:  Yes   Mood/Affect:  Normal       Dilation    Both eyes:  1.0% Mydriacyl, 2.5% Phenylephrine @ 10:02 AM        Slit Lamp and Fundus Exam    Slit Lamp Exam      Right Left   Lids/Lashes Dermatochalasis - upper lid Dermatochalasis - upper lid   Conjunctiva/Sclera White and quiet White and quiet   Cornea Arcus Arcus   Anterior Chamber Deep and quiet Deep and quiet   Iris Round and dilated Round and dilated   Lens PC IOL in good position with open PC PC IOL in good position with open PC   Vitreous Vitreous syneresis, Posterior vitreous detachment Vitreous syneresis, Posterior vitreous detachment       Fundus Exam      Right Left   Disc Pink and Sharp Pink and Sharp   C/D Ratio 0.3 0.4   Macula Large central PEDs with mild, stable, persistent SRF overlying pigment mottling and clumping, Drusen, no heme Blunted foveal reflex, +PED, Drusen, Retinal pigment epithelial mottling and clumping, no heme   Vessels Vascular attenuation Vascular attenuation    Periphery Attached Normal        Refraction    Wearing Rx      Sphere Cylinder Axis Add   Right -0.75 +0.50 089 +2.50   Left -0.75 Sphere  +2.50   Type:  Bifocal       Manifest Refraction      Sphere Cylinder Axis  Dist VA   Right +0.25 +0.50 090 20/40+2   Left              IMAGING AND PROCEDURES  Imaging and Procedures for @TODAY @  OCT, Retina - OU - Both Eyes       Right Eye Quality was good. Central Foveal Thickness: 372. Progression has been stable. Findings include abnormal foveal contour, no IRF, pigment epithelial detachment, retinal drusen , subretinal fluid, outer retinal atrophy (Mild improvement in SRF overlying PED).   Left Eye Quality was good. Central Foveal Thickness: 283. Progression has been stable. Findings include normal foveal contour, pigment epithelial detachment, no IRF, no SRF.   Notes *Images captured and stored on drive  Diagnosis / Impression:  OD: Exudative ARMD -- mild improvement in SRF overlying PED  OS: Non-Exudative ARMD w/ stable PEDs  Clinical management:  See below  Abbreviations: NFP - Normal foveal profile. CME - cystoid macular edema. PED - pigment epithelial detachment. IRF - intraretinal fluid. SRF - subretinal fluid. EZ - ellipsoid zone. ERM - epiretinal membrane. ORA - outer retinal atrophy. ORT - outer retinal tubulation. SRHM - subretinal hyper-reflective material         Intravitreal Injection, Pharmacologic Agent - OD - Right Eye       Time Out 01/24/2019. 10:51 AM. Confirmed correct patient, procedure, site, and patient consented.   Anesthesia Topical anesthesia was used. Anesthetic medications included Lidocaine 2%, Proparacaine 0.5%.   Procedure Preparation included 5% betadine to ocular surface, eyelid speculum. A 30 gauge needle was used.   Injection:  2 mg aflibercept Alfonse Flavors) SOLN   NDC: A3590391, Lot: [id number], Expiration date: 07/17/2019   Route: Intravitreal, Site: Right Eye, Waste: 0  mL  Post-op Post injection exam found visual acuity of at least counting fingers. The patient tolerated the procedure well. There were no complications. The patient received written and verbal post procedure care education.                 ASSESSMENT/PLAN:    ICD-10-CM   1. Exudative age-related macular degeneration of right eye with active choroidal neovascularization (HCC) H35.3211 Intravitreal Injection, Pharmacologic Agent - OD - Right Eye    aflibercept (EYLEA) SOLN 2 mg  2. 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 Retinal edema H35.81 OCT, Retina - OU - Both Eyes  4. Posterior vitreous detachment of both eyes H43.813   5. Pseudophakia of both eyes Z96.1   6. PCO (posterior capsular opacification), bilateral H26.493     1,3. Exudative age related macular degeneration, OD              - initial BCVA OD 20/30             - initial OCT w/ PEDs OU (OD > OS); mild SRF overlying PED OD  - initial FA with mild CNVM OD  - S/P IVA OD #1 (07.12.19), #2 (08.12.19), #3 (09.09.19)  - S/P IVE #1 OD (10.07.19), #2 (11.04.19), #3 (12.02.19), #4 (12.30.19), #5 (01.27.20)  - today, BCVA slightly decreased to 20/40 from 20/30 and OCT shows mild interval improvement in SRF overlying PEDs  - discussed holding treatment vs treat and extend as injections have had little improvement in OCT and BCVA since starting  - pt wishes to treat and extend to 8-10 weeks -- recommend IVE #6 OD today (03.09.20)  - pt wishes to be treated with IVE OD   - RBA of procedure discussed, questions answered  -  informed consent obtained and signed  - see procedure note  - Eylea paperwork signed and benefits investigation started on 09.09.19 -- approved for Good Days 2020  - f/u in 8-10 weeks -- DFE/OCT/possible injection  2. Age related macular degeneration, non-exudative, OS  - The incidence, anatomy, and pathology of dry AMD, risk of progression, and the AREDS and  AREDS 2 study including smoking risks discussed with patient  - continue amsler grid monitoring  4. PVD / vitreous syneresis OU  Discussed findings and prognosis  No RT or RD on 360 scleral depressed exam  Reviewed s/s of RT/RD  Strict return precautions for any such RT/RD signs/symptoms  5. Pseudophakia OU  - s/p CE/IOL OU  - beautiful surgeries, doing well  - monitor  6. PCO OU (OS > OD) - s/p YAG cap OU (09/2018) w/ Dr. Kathlen Mody - doing well, VA improved OU   Ophthalmic Meds Ordered this visit:  Meds ordered this encounter  Medications  . aflibercept (EYLEA) SOLN 2 mg       Return for f/u 8-10 weeks for exu ARMD OD, DFE, OCT.  There are no Patient Instructions on file for this visit.   Explained the diagnoses, plan, and follow up with the patient and they expressed understanding.  Patient expressed understanding of the importance of proper follow up care.   This document serves as a record of services personally performed by Gardiner Sleeper, MD, PhD. It was created on their behalf by Ernest Mallick, OA, an ophthalmic assistant. The creation of this record is the provider's dictation and/or activities during the visit.    Electronically signed by: Ernest Mallick, OA  03.05.2020 12:29 PM    Gardiner Sleeper, M.D., Ph.D. Diseases & Surgery of the Retina and Vitreous [facility]  I have reviewed the above documentation for accuracy and completeness, and I agree with the above. Gardiner Sleeper, M.D., Ph.D. 01/24/19 12:29 PM     Abbreviations: M myopia (nearsighted); A astigmatism; H hyperopia (farsighted); P presbyopia; Mrx spectacle prescription;  CTL contact lenses; OD right eye; OS left eye; OU both eyes  XT exotropia; ET esotropia; PEK punctate epithelial keratitis; PEE punctate epithelial erosions; DES dry eye syndrome; MGD meibomian gland dysfunction; ATs artificial tears; PFAT's preservative free artificial tears; [place] nuclear sclerotic cataract;  PSC posterior subcapsular cataract; ERM epi-retinal membrane; PVD posterior vitreous detachment; RD retinal detachment; DM diabetes mellitus; DR diabetic retinopathy; NPDR non-proliferative diabetic retinopathy; PDR proliferative diabetic retinopathy; CSME clinically significant macular edema; DME diabetic macular edema; dbh dot blot hemorrhages; CWS cotton wool spot; POAG primary open angle glaucoma; C/D cup-to-disc ratio; HVF humphrey visual field; GVF goldmann visual field; OCT optical coherence tomography; IOP intraocular pressure; BRVO Branch retinal vein occlusion; CRVO central retinal vein occlusion; CRAO central retinal artery occlusion; BRAO branch retinal artery occlusion; RT retinal tear; SB scleral buckle; PPV pars plana vitrectomy; VH Vitreous hemorrhage; PRP panretinal laser photocoagulation; IVK intravitreal kenalog; VMT vitreomacular traction; MH Macular hole;  NVD neovascularization of the disc; NVE neovascularization elsewhere; AREDS age related eye disease study; ARMD age related macular degeneration; POAG primary open angle glaucoma; EBMD epithelial/anterior basement membrane dystrophy; ACIOL anterior chamber intraocular lens; IOL intraocular lens; PCIOL posterior chamber intraocular lens; Phaco/IOL phacoemulsification with intraocular lens placement; [place] photorefractive keratectomy; LASIK laser assisted in situ keratomileusis; HTN hypertension; DM diabetes mellitus; COPD chronic obstructive pulmonary disease

## 2019-01-21 ENCOUNTER — Other Ambulatory Visit: Payer: Medicare Other | Admitting: *Deleted

## 2019-01-21 DIAGNOSIS — I10 Essential (primary) hypertension: Secondary | ICD-10-CM

## 2019-01-21 LAB — BASIC METABOLIC PANEL
BUN/Creatinine Ratio: 10 — ABNORMAL LOW (ref 12–28)
BUN: 9 mg/dL (ref 8–27)
CO2: 22 mmol/L (ref 20–29)
Calcium: 8.7 mg/dL (ref 8.7–10.3)
Chloride: 95 mmol/L — ABNORMAL LOW (ref 96–106)
Creatinine, Ser: 0.88 mg/dL (ref 0.57–1.00)
GFR calc Af Amer: 71 mL/min/{1.73_m2} (ref >59)
GFR calc non Af Amer: 61 mL/min/{1.73_m2} (ref >59)
Glucose: 125 mg/dL — ABNORMAL HIGH (ref 65–99)
Potassium: 3.4 mmol/L — ABNORMAL LOW (ref 3.5–5.2)
Sodium: 130 mmol/L — ABNORMAL LOW (ref 134–144)

## 2019-01-24 ENCOUNTER — Ambulatory Visit (INDEPENDENT_AMBULATORY_CARE_PROVIDER_SITE_OTHER): Payer: Medicare Other | Admitting: Ophthalmology

## 2019-01-24 ENCOUNTER — Encounter (INDEPENDENT_AMBULATORY_CARE_PROVIDER_SITE_OTHER): Payer: Self-pay | Admitting: Ophthalmology

## 2019-01-24 DIAGNOSIS — H353122 Nonexudative age-related macular degeneration, left eye, intermediate dry stage: Secondary | ICD-10-CM

## 2019-01-24 DIAGNOSIS — H3581 Retinal edema: Secondary | ICD-10-CM | POA: Diagnosis not present

## 2019-01-24 DIAGNOSIS — H26493 Other secondary cataract, bilateral: Secondary | ICD-10-CM

## 2019-01-24 DIAGNOSIS — H353211 Exudative age-related macular degeneration, right eye, with active choroidal neovascularization: Secondary | ICD-10-CM

## 2019-01-24 DIAGNOSIS — H43813 Vitreous degeneration, bilateral: Secondary | ICD-10-CM | POA: Diagnosis not present

## 2019-01-24 DIAGNOSIS — Z961 Presence of intraocular lens: Secondary | ICD-10-CM | POA: Diagnosis not present

## 2019-01-24 MED ORDER — AFLIBERCEPT 2MG/0.05ML IZ SOLN FOR KALEIDOSCOPE
2.0000 mg | INTRAVITREAL | Status: DC
  Administered 2019-01-24: 2 mg via INTRAVITREAL

## 2019-01-31 ENCOUNTER — Telehealth: Payer: Self-pay

## 2019-01-31 NOTE — Telephone Encounter (Signed)
Rescheduled pharmd visit for 03/01/19

## 2019-01-31 NOTE — Telephone Encounter (Signed)
-----   Message from Leeroy Bock, [facility] sent at 01/31/2019 11:27 AM EDT ----- Can move back BP visit 3 weeks if pt agreeable

## 2019-02-09 ENCOUNTER — Ambulatory Visit: Payer: Medicare Other

## 2019-02-11 DIAGNOSIS — R5383 Other fatigue: Secondary | ICD-10-CM | POA: Diagnosis not present

## 2019-02-11 DIAGNOSIS — I10 Essential (primary) hypertension: Secondary | ICD-10-CM | POA: Diagnosis not present

## 2019-02-11 DIAGNOSIS — E039 Hypothyroidism, unspecified: Secondary | ICD-10-CM | POA: Diagnosis not present

## 2019-02-14 DIAGNOSIS — E039 Hypothyroidism, unspecified: Secondary | ICD-10-CM | POA: Diagnosis not present

## 2019-02-14 DIAGNOSIS — R5383 Other fatigue: Secondary | ICD-10-CM | POA: Diagnosis not present

## 2019-02-14 DIAGNOSIS — I10 Essential (primary) hypertension: Secondary | ICD-10-CM | POA: Diagnosis not present

## 2019-02-23 DIAGNOSIS — E871 Hypo-osmolality and hyponatremia: Secondary | ICD-10-CM | POA: Diagnosis not present

## 2019-02-24 ENCOUNTER — Telehealth: Payer: Self-pay | Admitting: Pharmacist

## 2019-02-24 NOTE — Telephone Encounter (Signed)
Spoke with patient she reports that her BP has been running  130/78 ish and she feels that this is doing much better. She states that she is still fatigued all the time and had some more blood work yesterday at her primary care doctor. She was initially found to need an adjustment of her levothyroxine, but this looked good with more recent blood work. She is unsure of what, but she states she did have an abnormal BMET and this was repeated yesterday. Advised her to follow the recommendations of primary care with the blood work. From a blood pressure perspective she is doing well. Will cancel her appt with HTN clinic and continue current regimen. She is aware to call with any issues or changes with BP.

## 2019-03-01 ENCOUNTER — Ambulatory Visit: Payer: Medicare Other

## 2019-03-08 ENCOUNTER — Other Ambulatory Visit: Payer: Self-pay | Admitting: Family Medicine

## 2019-03-08 ENCOUNTER — Ambulatory Visit
Admission: RE | Admit: 2019-03-08 | Discharge: 2019-03-08 | Disposition: A | Discharge status: Home / Self Care | Payer: Medicare Other | Source: Ambulatory Visit | Attending: Family Medicine | Admitting: Family Medicine

## 2019-03-08 DIAGNOSIS — E871 Hypo-osmolality and hyponatremia: Secondary | ICD-10-CM

## 2019-03-10 ENCOUNTER — Telehealth: Payer: Self-pay | Admitting: Interventional Cardiology

## 2019-03-10 NOTE — Telephone Encounter (Signed)
Attempted to contact in regards to appt scheduled with Dr. [name] 4/30.  Phone rang several times with no answer and no VM and then disconnected.  Need to change to virtual visit.  Can have 4/30 at 10:30A.

## 2019-03-11 NOTE — Telephone Encounter (Signed)
Spoke with pt about appt.  She does not have a smart phone but agreeable to do phone visit.  Went over consent with her and instructions for appt.  Pt verbalized understanding and was in agreement with plan.      Virtual Visit Pre-Appointment Phone Call  "(Name), I am calling you today to discuss your upcoming appointment. We are currently trying to limit exposure to the virus that causes COVID-19 by seeing patients at home rather than in the office."  1. "What is the BEST phone number to call the day of the visit?" - include this in appointment notes  2. "Do you have or have access to (through a family member/friend) a smartphone with video capability that we can use for your visit?" a. If yes - list this number in appt notes as "cell" (if different from BEST phone #) and list the appointment type as a VIDEO visit in appointment notes b. If no - list the appointment type as a PHONE visit in appointment notes  3. Confirm consent - "In the setting of the current Covid19 crisis, you are scheduled for a (phone or video) visit with your provider on (date) at (time).  Just as we do with many in-office visits, in order for you to participate in this visit, we must obtain consent.  If you'd like, I can send this to your mychart (if signed up) or email for you to review.  Otherwise, I can obtain your verbal consent now.  All virtual visits are billed to your insurance company just like a normal visit would be.  By agreeing to a virtual visit, we'd like you to understand that the technology does not allow for your provider to perform an examination, and thus may limit your provider's ability to fully assess your condition. If your provider identifies any concerns that need to be evaluated in person, we will make arrangements to do so.  Finally, though the technology is pretty good, we cannot assure that it will always work on either your or our end, and in the setting of a video visit, we may have to convert  it to a phone-only visit.  In either situation, we cannot ensure that we have a secure connection.  Are you willing to proceed?" STAFF: Did the patient verbally acknowledge consent to telehealth visit? Document YES/NO here: yes  4. Advise patient to be prepared - "Two hours prior to your appointment, go ahead and check your blood pressure, pulse, oxygen saturation, and your weight (if you have the equipment to check those) and write them all down. When your visit starts, your provider will ask you for this information. If you have an Apple Watch or Kardia device, please plan to have heart rate information ready on the day of your appointment. Please have a pen and paper handy nearby the day of the visit as well."  5. Give patient instructions for MyChart download to smartphone OR Doximity/Doxy.me as below if video visit (depending on what platform provider is using)  6. Inform patient they will receive a phone call 15 minutes prior to their appointment time (may be from unknown caller ID) so they should be prepared to answer    TELEPHONE CALL NOTE  Tammy Allison has been deemed a candidate for a follow-up tele-health visit to limit community exposure during the Covid-19 pandemic. I spoke with the patient via phone to ensure availability of phone/video source, confirm preferred email & phone number, and discuss instructions and expectations.  I reminded Tammy Allison to be prepared with any vital sign and/or heart rhythm information that could potentially be obtained via home monitoring, at the time of her visit. I reminded Tammy Allison to expect a phone call prior to her visit.  Bowers,Jennifer L, LPN 0/08/9322 5:57 AM   INSTRUCTIONS FOR DOWNLOADING THE MYCHART APP TO SMARTPHONE  - The patient must first make sure to have activated MyChart and know their login information - If Apple, go to [employer] and type in MyChart in the search bar and download the app. If Android, ask patient  to go to [employer] and type in [place] in the search bar and download the app. The app is free but as with any other app downloads, their phone may require them to verify saved payment information or Apple/Android password.  - The patient will need to then log into the app with their MyChart username and password, and select [facility] as their healthcare provider to link the account. When it is time for your visit, go to the MyChart app, find appointments, and click Begin Video Visit. Be sure to Select Allow for your device to access the Microphone and Camera for your visit. You will then be connected, and your provider will be with you shortly.  **If they have any issues connecting, or need assistance please contact MyChart service desk [phone])**  **If using a computer, in order to ensure the best quality for their visit they will need to use either of the following Internet Browsers: [employer], or Google Chrome**  IF USING DOXIMITY or DOXY.ME - The patient will receive a link just prior to their visit by text.     FULL LENGTH CONSENT FOR TELE-HEALTH VISIT   I hereby voluntarily request, consent and authorize [facility] and its employed or contracted physicians, physician assistants, nurse practitioners or other licensed health care professionals (the Practitioner), to provide me with telemedicine health care services (the "Services") as deemed necessary by the treating Practitioner. I acknowledge and consent to receive the Services by the Practitioner via telemedicine. I understand that the telemedicine visit will involve communicating with the Practitioner through live audiovisual communication technology and the disclosure of certain medical information by electronic transmission. I acknowledge that I have been given the opportunity to request an in-person assessment or other available alternative prior to the telemedicine visit and am voluntarily  participating in the telemedicine visit.  I understand that I have the right to withhold or withdraw my consent to the use of telemedicine in the Allison of my care at any time, without affecting my right to future care or treatment, and that the Practitioner or I may terminate the telemedicine visit at any time. I understand that I have the right to inspect all information obtained and/or recorded in the Allison of the telemedicine visit and may receive copies of available information for a reasonable fee.  I understand that some of the potential risks of receiving the Services via telemedicine include:  Marland Kitchen Delay or interruption in medical evaluation due to technological equipment failure or disruption; . Information transmitted may not be sufficient (e.g. poor resolution of images) to allow for appropriate medical decision making by the Practitioner; and/or  . In rare instances, security protocols could fail, causing a breach of personal health information.  Furthermore, I acknowledge that it is my responsibility to provide information about my medical history, conditions and care that is complete and accurate to the best of  my ability. I acknowledge that Practitioner's advice, recommendations, and/or decision may be based on factors not within their control, such as incomplete or inaccurate data provided by me or distortions of diagnostic images or specimens that may result from electronic transmissions. I understand that the practice of medicine is not an exact science and that Practitioner makes no warranties or guarantees regarding treatment outcomes. I acknowledge that I will receive a copy of this consent concurrently upon execution via email to the email address I last provided but may also request a printed copy by calling the office of [facility].    I understand that my insurance will be billed for this visit.   I have read or had this consent read to me. . I understand the contents of this  consent, which adequately explains the benefits and risks of the Services being provided via telemedicine.  . I have been provided ample opportunity to ask questions regarding this consent and the Services and have had my questions answered to my satisfaction. . I give my informed consent for the services to be provided through the use of telemedicine in my medical care  By participating in this telemedicine visit I agree to the above.

## 2019-03-16 NOTE — Progress Notes (Signed)
Virtual Visit via Video Note   This visit type was conducted due to national recommendations for restrictions regarding the COVID-19 Pandemic (e.g. social distancing) in an effort to limit this patient's exposure and mitigate transmission in our community.  Due to her co-morbid illnesses, this patient is at least at moderate risk for complications without adequate follow up.  This format is felt to be most appropriate for this patient at this time.  All issues noted in this document were discussed and addressed.  A limited physical exam was performed with this format.  Please refer to the patient's chart for her consent to telehealth for [facility].   Evaluation Performed:  Follow-up visit  Date:  03/17/2019   ID:  Tammy Allison, DOB 1935/07/17, MRN [id number]  Patient Location: Home Provider Location: Office  PCP:  [name], MD  Cardiologist:  Sinclair Grooms, MD  Electrophysiologist:  None   Chief Complaint:  Hypertension  History of Present Illness:    Tammy Allison is a 84 y.o. female with hypertension, hyperlipidemia, gastroesophageal reflux, and palpitations.  The blood pressure is doing well.  The patient has some fatigue and lassitude.  She has been following up with Dr. [name] concerning this.  She denies chest pain.  Her risk factors for ischemic vascular events include hypertension, hyperlipidemia, and her age.  Direct LDL was 85 and 2019.  She has extreme fatigue but feels she is sleeping okay.  She rests a lot during the day and it is quite easy to fall asleep as soon as she is sitting.  The patient does not have symptoms concerning for COVID-19 infection (fever, chills, cough, or new shortness of breath).    Past Medical History:  Diagnosis Date  . Arthritis   . GERD (gastroesophageal reflux disease)   . H/O hiatal hernia   . Hyperlipidemia   . Hypertension   . Hypothyroidism   . Irritable bowel syndrome   . Macular degeneration    Exudative - OD   Past Surgical History:  Procedure Laterality Date  . CATARACT EXTRACTION    . EYE SURGERY Bilateral    cataract surgery w/ lens implant  . FRACTURE SURGERY     right hip  . LUMBAR LAMINECTOMY/DECOMPRESSION MICRODISCECTOMY Left 05/25/2014   Procedure: LUMBAR LAMINECTOMY/DECOMPRESSION MICRODISCECTOMY 1 LEVEL   lumbar three;  Surgeon: Hosie Spangle, MD;  Location: [facility];  Service: Neurosurgery;  Laterality: Left;  . THYROID SURGERY       Current Meds  Medication Sig  . cetirizine (ZYRTEC) 10 MG tablet Take 5 mg by mouth daily as needed for allergies.  . cholecalciferol (VITAMIN D) 1000 UNITS tablet Take 1,000 Units by mouth every morning.   . hydrALAZINE (APRESOLINE) 50 MG tablet Take 1 tablet (50 mg total) by mouth 3 (three) times daily.  Marland Kitchen levothyroxine (SYNTHROID) 88 MCG tablet Take 88 mcg by mouth daily before breakfast.   . losartan (COZAAR) 100 MG tablet Take 100 mg by mouth daily.  . metoprolol tartrate (LOPRESSOR) 100 MG tablet Take 1 tablet (100 mg total) by mouth 2 (two) times daily.  . Multiple Vitamins-Minerals (PRESERVISION AREDS 2 PO) Take 1 capsule by mouth daily.  . pantoprazole (PROTONIX) 40 MG tablet Take 40 mg by mouth every morning.   . Probiotic Product (ALIGN) 4 MG CAPS Take 4 mg by mouth daily.   Current Facility-Administered Medications for the 03/17/19 encounter (Telemedicine) with Belva Crome, MD  Medication  . aflibercept (EYLEA)  SOLN 2 mg  . aflibercept (EYLEA) SOLN 2 mg  . aflibercept (EYLEA) SOLN 2 mg  . aflibercept (EYLEA) SOLN 2 mg  . aflibercept (EYLEA) SOLN 2 mg  . aflibercept (EYLEA) SOLN 2 mg  . Bevacizumab (AVASTIN) SOLN 1.25 mg  . Bevacizumab (AVASTIN) SOLN 1.25 mg  . Bevacizumab (AVASTIN) SOLN 1.25 mg     Allergies:   Carvedilol and Sulfa antibiotics   Social History   Tobacco Use  . Smoking status: Never Smoker  . Smokeless tobacco: Never Used  Substance Use Topics  . Alcohol use: No  . Drug use: Never      Family Hx: The patient's family history includes Macular degeneration in her mother.  ROS:   Please see the history of present illness.    Fatigue and lassitude.  Easy to fall asleep during the daytime.  Having difficulty with macular degeneration.  Feels she is sleeping well.  Walks 0.75 miles each day.  Husband is now significantly improved.  He was really sick in the latter part of 2019 after developing sepsis. All other systems reviewed and are negative.   Prior CV studies:   The following studies were reviewed today:  No new cardiovascular studies or imaging data.  Labs/Other Tests and Data Reviewed:    EKG:  An ECG dated September 15, 2018 was personally reviewed today and demonstrated:  Sinus rhythm, and normal appearance.  Recent Labs: 01/21/2019: BUN 9; Creatinine, Ser 0.88; Potassium 3.4; Sodium 130   Recent Lipid Panel No results found for: CHOL, TRIG, HDL, CHOLHDL, LDLCALC, LDLDIRECT  Wt Readings from Last 3 Encounters:  03/17/19 139 lb (63 kg)  12/15/18 141 lb 12.8 oz (64.3 kg)  09/15/18 146 lb 12.8 oz (66.6 kg)     Objective:    Vital Signs:  BP 132/63   Pulse 70   Ht 5' 4.5" (1.638 m)   Wt 139 lb (63 kg)   BMI 23.49 kg/m    VITAL SIGNS:  reviewed GEN:  no acute distress NEURO:  alert and oriented x 3, no obvious focal deficit  ASSESSMENT & PLAN:    1. Benign essential hypertension   2. Snoring   3. Hyperlipidemia LDL goal <100   4. 2019 novel coronavirus disease (COVID-19)    PLAN:  1. The blood pressure is now controlled.  Low-salt diet and weight control are reiterated. 2. Sleep apnea should be considered as an explanation for her fatigue.  I discussed this with her.  She is not of the feeling that sleep apnea is a problem.  A sleep study should be considered. 3. Last lipid within the past 12 months was LDL of 85.  We reviewed current care to Dr. Sabra Heck.  We will be happy to see again in the future if needed.  Consider performing a sleep study.   COVID-19 Education: The signs and symptoms of COVID-19 were discussed with the patient and how to seek care for testing (follow up with PCP or arrange E-visit).  The importance of social distancing was discussed today.  Time:   Today, I have spent 15 minutes with the patient with telehealth technology discussing the above problems.     Medication Adjustments/Labs and Tests Ordered: Current medicines are reviewed at length with the patient today.  Concerns regarding medicines are outlined above.   Tests Ordered: No orders of the defined types were placed in this encounter.   Medication Changes: No orders of the defined types were placed in this encounter.  Disposition:  Follow up prn  Signed, Sinclair Grooms, MD  03/17/2019 9:20 AM    [facility];[place]

## 2019-03-17 ENCOUNTER — Encounter: Payer: Self-pay | Admitting: Interventional Cardiology

## 2019-03-17 ENCOUNTER — Telehealth (INDEPENDENT_AMBULATORY_CARE_PROVIDER_SITE_OTHER): Payer: Medicare Other | Admitting: Interventional Cardiology

## 2019-03-17 ENCOUNTER — Other Ambulatory Visit: Payer: Self-pay

## 2019-03-17 VITALS — BP 132/63 | HR 70 | Ht 64.5 in | Wt 139.0 lb

## 2019-03-17 DIAGNOSIS — R0683 Snoring: Secondary | ICD-10-CM

## 2019-03-17 DIAGNOSIS — I10 Essential (primary) hypertension: Secondary | ICD-10-CM | POA: Diagnosis not present

## 2019-03-17 DIAGNOSIS — E785 Hyperlipidemia, unspecified: Secondary | ICD-10-CM

## 2019-03-17 NOTE — Patient Instructions (Signed)
Medication Instructions:  Your physician recommends that you continue on your current medications as directed. Please refer to the Current Medication list given to you today.  If you need a refill on your cardiac medications before your next appointment, please call your pharmacy.   Lab work: None If you have labs (blood work) drawn today and your tests are completely normal, you will receive your results only by: Marland Kitchen MyChart Message (if you have MyChart) OR . A paper copy in the mail If you have any lab test that is abnormal or we need to change your treatment, we will call you to review the results.  Testing/Procedures: None  Follow-Up: Your physician recommends that you schedule a follow-up appointment as needed with Dr. [name].   Any Other Special Instructions Will Be Listed Below (If Applicable).  Please speak to Dr. Sabra Heck about getting a sleep study performed.   Low-Sodium Eating Plan Sodium, which is an element that makes up salt, helps you maintain a healthy balance of fluids in your body. Too much sodium can increase your blood pressure and cause fluid and waste to be held in your body. Your health care provider or dietitian may recommend following this plan if you have high blood pressure (hypertension), kidney disease, liver disease, or heart failure. Eating less sodium can help lower your blood pressure, reduce swelling, and protect your heart, liver, and kidneys. What are tips for following this plan? General guidelines  Most people on this plan should limit their sodium intake to 1,500-2,000 mg (milligrams) of sodium each day. Reading food labels   The Nutrition Facts label lists the amount of sodium in one serving of the food. If you eat more than one serving, you must multiply the listed amount of sodium by the number of servings.  Choose foods with less than 140 mg of sodium per serving.  Avoid foods with 300 mg of sodium or more per serving. Shopping  Look for  lower-sodium products, often labeled as "low-sodium" or "no salt added."  Always check the sodium content even if foods are labeled as "unsalted" or "no salt added".  Buy fresh foods. ? Avoid canned foods and premade or frozen meals. ? Avoid canned, cured, or processed meats  Buy breads that have less than 80 mg of sodium per slice. Cooking  Eat more home-cooked food and less restaurant, buffet, and fast food.  Avoid adding salt when cooking. Use salt-free seasonings or herbs instead of table salt or sea salt. Check with your health care provider or pharmacist before using salt substitutes.  Cook with plant-based oils, such as canola, sunflower, or olive oil. Meal planning  When eating at a restaurant, ask that your food be prepared with less salt or no salt, if possible.  Avoid foods that contain MSG (monosodium glutamate). MSG is sometimes added to [place] food, bouillon, and some canned foods. What foods are recommended? The items listed may not be a complete list. Talk with your dietitian about what dietary choices are best for you. Grains Low-sodium cereals, including oats, puffed wheat and rice, and shredded wheat. Low-sodium crackers. Unsalted rice. Unsalted pasta. Low-sodium bread. Whole-grain breads and whole-grain pasta. Vegetables Fresh or frozen vegetables. "No salt added" canned vegetables. "No salt added" tomato sauce and paste. Low-sodium or reduced-sodium tomato and vegetable juice. Fruits Fresh, frozen, or canned fruit. Fruit juice. Meats and other protein foods Fresh or frozen (no salt added) meat, poultry, seafood, and fish. Low-sodium canned tuna and salmon. Unsalted nuts. Dried peas,  beans, and lentils without added salt. Unsalted canned beans. Eggs. Unsalted nut butters. Dairy Milk. Soy milk. Cheese that is naturally low in sodium, such as ricotta cheese, fresh mozzarella, or Swiss cheese Low-sodium or reduced-sodium cheese. Cream cheese. Yogurt. Fats and oils  Unsalted butter. Unsalted margarine with no trans fat. Vegetable oils such as canola or olive oils. Seasonings and other foods Fresh and dried herbs and spices. Salt-free seasonings. Low-sodium mustard and ketchup. Sodium-free salad dressing. Sodium-free light mayonnaise. Fresh or refrigerated horseradish. Lemon juice. Vinegar. Homemade, reduced-sodium, or low-sodium soups. Unsalted popcorn and pretzels. Low-salt or salt-free chips. What foods are not recommended? The items listed may not be a complete list. Talk with your dietitian about what dietary choices are best for you. Grains Instant hot cereals. Bread stuffing, pancake, and biscuit mixes. Croutons. Seasoned rice or pasta mixes. Noodle soup cups. Boxed or frozen macaroni and cheese. Regular salted crackers. Self-rising flour. Vegetables Sauerkraut, pickled vegetables, and relishes. Olives. [place] fries. Onion rings. Regular canned vegetables (not low-sodium or reduced-sodium). Regular canned tomato sauce and paste (not low-sodium or reduced-sodium). Regular tomato and vegetable juice (not low-sodium or reduced-sodium). Frozen vegetables in sauces. Meats and other protein foods Meat or fish that is salted, canned, smoked, spiced, or pickled. Bacon, ham, sausage, hotdogs, corned beef, chipped beef, packaged lunch meats, salt pork, jerky, pickled herring, anchovies, regular canned tuna, sardines, salted nuts. Dairy Processed cheese and cheese spreads. Cheese curds. Blue cheese. Feta cheese. String cheese. Regular cottage cheese. Buttermilk. Canned milk. Fats and oils Salted butter. Regular margarine. Ghee. Bacon fat. Seasonings and other foods Onion salt, garlic salt, seasoned salt, table salt, and sea salt. Canned and packaged gravies. Worcestershire sauce. Tartar sauce. Barbecue sauce. Teriyaki sauce. Soy sauce, including reduced-sodium. Steak sauce. Fish sauce. Oyster sauce. Cocktail sauce. Horseradish that you find on the shelf. Regular  ketchup and mustard. Meat flavorings and tenderizers. Bouillon cubes. Hot sauce and Tabasco sauce. Premade or packaged marinades. Premade or packaged taco seasonings. Relishes. Regular salad dressings. Salsa. Potato and tortilla chips. Corn chips and puffs. Salted popcorn and pretzels. Canned or dried soups. Pizza. Frozen entrees and pot pies. Summary  Eating less sodium can help lower your blood pressure, reduce swelling, and protect your heart, liver, and kidneys.  Most people on this plan should limit their sodium intake to 1,500-2,000 mg (milligrams) of sodium each day.  Canned, boxed, and frozen foods are high in sodium. Restaurant foods, fast foods, and pizza are also very high in sodium. You also get sodium by adding salt to food.  Try to cook at home, eat more fresh fruits and vegetables, and eat less fast food, canned, processed, or prepared foods. This information is not intended to replace advice given to you by your health care provider. Make sure you discuss any questions you have with your health care provider. Document Released: 04/25/2002 Document Revised: 10/27/2016 Document Reviewed: 10/27/2016 Elsevier Interactive Patient Education  2019 [employer].

## 2019-03-21 ENCOUNTER — Encounter (INDEPENDENT_AMBULATORY_CARE_PROVIDER_SITE_OTHER): Payer: Self-pay | Admitting: Ophthalmology

## 2019-03-21 ENCOUNTER — Ambulatory Visit (INDEPENDENT_AMBULATORY_CARE_PROVIDER_SITE_OTHER): Payer: Medicare Other | Admitting: Ophthalmology

## 2019-03-21 ENCOUNTER — Other Ambulatory Visit: Payer: Self-pay

## 2019-03-21 DIAGNOSIS — H3581 Retinal edema: Secondary | ICD-10-CM

## 2019-03-21 DIAGNOSIS — H26493 Other secondary cataract, bilateral: Secondary | ICD-10-CM

## 2019-03-21 DIAGNOSIS — Z961 Presence of intraocular lens: Secondary | ICD-10-CM | POA: Diagnosis not present

## 2019-03-21 DIAGNOSIS — H353211 Exudative age-related macular degeneration, right eye, with active choroidal neovascularization: Secondary | ICD-10-CM | POA: Diagnosis not present

## 2019-03-21 DIAGNOSIS — H353122 Nonexudative age-related macular degeneration, left eye, intermediate dry stage: Secondary | ICD-10-CM

## 2019-03-21 DIAGNOSIS — H43813 Vitreous degeneration, bilateral: Secondary | ICD-10-CM

## 2019-03-21 MED ORDER — AFLIBERCEPT 2MG/0.05ML IZ SOLN FOR KALEIDOSCOPE
2.0000 mg | INTRAVITREAL | Status: AC | PRN
  Administered 2019-03-21: 2 mg via INTRAVITREAL

## 2019-03-21 NOTE — Progress Notes (Signed)
[facility] Note  03/21/2019     CHIEF COMPLAINT Patient presents for Retina Follow Up   HISTORY OF PRESENT ILLNESS: Tammy Allison is a 84 y.o. female who presents to the clinic today for:   HPI    Retina Follow Up    Patient presents with  Wet AMD.  In right eye.  Severity is moderate.  Duration of 8 weeks.  Since onset it is stable.  I, the attending physician,  performed the HPI with the patient and updated documentation appropriately.          Comments    Patient states vision the same OU.       Last edited by Bernarda Caffey, MD on 03/21/2019 10:14 AM. (History)    pt states her right eye has gotten a little worse  Referring physician: [name], MD [place], [place] [address]  HISTORICAL INFORMATION:   Selected notes from the MEDICAL RECORD NUMBER Referred by Dr. Quentin Ore for exu ARMD OD LEE: 07.01.19 Read Drivers) [BCVA: OD: 20/40+2 OS: 20/25] Ocular Hx-non-exu ARMD OS, open angle glaucoma, pseudophakia OU, PVD, corneal guttata, miosis, asteroid hyalosis, posterior capsular opacification OU PMH-HTN, high cholesterol, thyroid disease    CURRENT MEDICATIONS: No current outpatient medications on file. (Ophthalmic Drugs)   Current Facility-Administered Medications (Ophthalmic Drugs)  Medication Route  . aflibercept (EYLEA) SOLN 2 mg Intravitreal  . aflibercept (EYLEA) SOLN 2 mg Intravitreal  . aflibercept (EYLEA) SOLN 2 mg Intravitreal  . aflibercept (EYLEA) SOLN 2 mg Intravitreal  . aflibercept (EYLEA) SOLN 2 mg Intravitreal  . aflibercept (EYLEA) SOLN 2 mg Intravitreal   Current Outpatient Medications (Other)  Medication Sig  . cetirizine (ZYRTEC) 10 MG tablet Take 5 mg by mouth daily as needed for allergies.  . cholecalciferol (VITAMIN D) 1000 UNITS tablet Take 1,000 Units by mouth every morning.   . hydrALAZINE (APRESOLINE) 50 MG tablet Take 1 tablet (50 mg total) by mouth 3 (three) times daily.  Marland Kitchen  levothyroxine (SYNTHROID) 88 MCG tablet Take 88 mcg by mouth daily before breakfast.   . losartan (COZAAR) 100 MG tablet Take 100 mg by mouth daily.  . metoprolol tartrate (LOPRESSOR) 100 MG tablet Take 1 tablet (100 mg total) by mouth 2 (two) times daily.  . Multiple Vitamins-Minerals (PRESERVISION AREDS 2 PO) Take 1 capsule by mouth daily.  . pantoprazole (PROTONIX) 40 MG tablet Take 40 mg by mouth every morning.   . Probiotic Product (ALIGN) 4 MG CAPS Take 4 mg by mouth daily.   Current Facility-Administered Medications (Other)  Medication Route  . Bevacizumab (AVASTIN) SOLN 1.25 mg Intravitreal  . Bevacizumab (AVASTIN) SOLN 1.25 mg Intravitreal  . Bevacizumab (AVASTIN) SOLN 1.25 mg Intravitreal      REVIEW OF SYSTEMS: ROS    Positive for: Eyes   Negative for: Constitutional, Gastrointestinal, Neurological, Skin, Genitourinary, Musculoskeletal, HENT, Endocrine, Cardiovascular, Respiratory, Psychiatric, Allergic/Imm, Heme/Lymph   Last edited by Roselee Nova D on 03/21/2019  9:30 AM. (History)       ALLERGIES Allergies  Allergen Reactions  . Carvedilol     Full body rash  . Sulfa Antibiotics Hives    PAST MEDICAL HISTORY Past Medical History:  Diagnosis Date  . Arthritis   . GERD (gastroesophageal reflux disease)   . H/O hiatal hernia   . Hyperlipidemia   . Hypertension   . Hypothyroidism   . Irritable bowel syndrome   . Macular degeneration    Exudative - OD  Past Surgical History:  Procedure Laterality Date  . CATARACT EXTRACTION    . EYE SURGERY Bilateral    cataract surgery w/ lens implant  . FRACTURE SURGERY     right hip  . LUMBAR LAMINECTOMY/DECOMPRESSION MICRODISCECTOMY Left 05/25/2014   Procedure: LUMBAR LAMINECTOMY/DECOMPRESSION MICRODISCECTOMY 1 LEVEL   lumbar three;  Surgeon: Hosie Spangle, MD;  Location: [facility];  Service: Neurosurgery;  Laterality: Left;  . THYROID SURGERY      FAMILY HISTORY Family History  Problem Relation Age of  Onset  . Macular degeneration Mother     SOCIAL HISTORY Social History   Tobacco Use  . Smoking status: Never Smoker  . Smokeless tobacco: Never Used  Substance Use Topics  . Alcohol use: No  . Drug use: Never         OPHTHALMIC EXAM:  Base Eye Exam    Visual Acuity (Snellen - Linear)      Right Left   Dist cc 20/70 -2 20/25 -2   Dist ph cc 20/50 +2 20/25 +2   Correction:  Glasses       Tonometry (Tonopen, 9:39 AM)      Right Left   Pressure 10 10       Pupils      Dark Light Shape React APD   Right 3 2 Round Brisk None   Left 3 2 Round Brisk None       Visual Fields (Counting fingers)      Left Right    Full Full       Extraocular Movement      Right Left    Full, Ortho Full, Ortho       Neuro/Psych    Oriented x3:  Yes   Mood/Affect:  Normal       Dilation    Both eyes:  1.0% Mydriacyl, 2.5% Phenylephrine @ 9:39 AM        Slit Lamp and Fundus Exam    Slit Lamp Exam      Right Left   Lids/Lashes Dermatochalasis - upper lid Dermatochalasis - upper lid   Conjunctiva/Sclera White and quiet White and quiet   Cornea Arcus Arcus   Anterior Chamber Deep and quiet Deep and quiet   Iris Round and dilated Round and dilated   Lens PC IOL in good position with open PC PC IOL in good position with open PC   Vitreous Vitreous syneresis, Posterior vitreous detachment Vitreous syneresis, Posterior vitreous detachment       Fundus Exam      Right Left   Disc Pink and Sharp Pink and Sharp   C/D Ratio 0.3 0.4   Macula Large central PEDs with persistent SRF overlying, pigment mottling and clumping, Drusen, no heme Blunted foveal reflex, +PED, Drusen, Retinal pigment epithelial mottling and clumping, no heme   Vessels Vascular attenuation Vascular attenuation   Periphery Attached Normal        Refraction    Wearing Rx      Sphere Cylinder Axis Add   Right -0.75 +0.50 089 +2.50   Left -0.75 Sphere  +2.50   Type:  Bifocal       Manifest Refraction       Sphere Cylinder Axis Dist VA   Right -0.75 +0.50 090 20/50+2   Left      No change OD          IMAGING AND PROCEDURES  Imaging and Procedures for @TODAY @  OCT, Retina - OU - Both  Eyes       Right Eye Quality was good. Central Foveal Thickness: 374. Progression has been stable. Findings include abnormal foveal contour, no IRF, pigment epithelial detachment, retinal drusen , subretinal fluid, outer retinal atrophy (Persistent SRF overlying PED).   Left Eye Quality was good. Central Foveal Thickness: 286. Progression has been stable. Findings include normal foveal contour, pigment epithelial detachment, no IRF, no SRF.   Notes *Images captured and stored on drive  Diagnosis / Impression:  OD: Exudative ARMD -- persistent SRF overlying PED  OS: Non-Exudative ARMD w/ stable PEDs  Clinical management:  See below  Abbreviations: NFP - Normal foveal profile. CME - cystoid macular edema. PED - pigment epithelial detachment. IRF - intraretinal fluid. SRF - subretinal fluid. EZ - ellipsoid zone. ERM - epiretinal membrane. ORA - outer retinal atrophy. ORT - outer retinal tubulation. SRHM - subretinal hyper-reflective material         Intravitreal Injection, Pharmacologic Agent - OD - Right Eye       Time Out 03/21/2019. 10:26 AM. Confirmed correct patient, procedure, site, and patient consented.   Anesthesia Topical anesthesia was used. Anesthetic medications included Lidocaine 2%, Proparacaine 0.5%.   Procedure Preparation included 5% betadine to ocular surface, eyelid speculum. A 30 gauge needle was used.   Injection:  2 mg aflibercept Alfonse Flavors) SOLN   NDC: [id number], Lot: [id number], Expiration date: 06/18/2019   Route: Intravitreal, Site: Right Eye, Waste: 0.05 mL  Post-op Post injection exam found visual acuity of at least counting fingers. The patient tolerated the procedure well. There were no complications. The patient received written and verbal post procedure  care education.                 ASSESSMENT/PLAN:    ICD-10-CM   1. Exudative age-related macular degeneration of right eye with active choroidal neovascularization (HCC) H35.3211 Intravitreal Injection, Pharmacologic Agent - OD - Right Eye    aflibercept (EYLEA) SOLN 2 mg  2. 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 Retinal edema H35.81 OCT, Retina - OU - Both Eyes  4. Posterior vitreous detachment of both eyes H43.813   5. Pseudophakia of both eyes Z96.1   6. PCO (posterior capsular opacification), bilateral H26.493     1,3. Exudative age related macular degeneration, OD              - initial BCVA OD 20/30             - initial OCT w/ PEDs OU (OD > OS); mild SRF overlying PED OD  - initial FA with mild CNVM OD  - S/P IVA OD #1 (07.12.19), #2 (08.12.19), #3 (09.09.19)  - S/P IVE #1 OD (10.07.19), #2 (11.04.19), #3 (12.02.19), #4 (12.30.19), #5 (01.27.20), #6 (03.09.20)  - today, BCVA decreased further to 20/50+2 from 20/30 and OCT shows persistent SRF overlying PEDs  - recommend IVE #7 OD today (05.04.20) with dec interval to 6 wks  - pt wishes to be treated with IVE OD   - RBA of procedure discussed, questions answered  - informed consent obtained and signed  - see procedure note  - Eylea paperwork signed and benefits investigation started on 09.09.19 -- approved for Good Days 2020  - f/u in 6 weeks -- DFE/OCT/possible injection -- consider switch in med again  2. Age related macular degeneration, non-exudative, OS  - The incidence, anatomy, and pathology of dry AMD, risk of progression, and the AREDS and AREDS 2 study  including smoking risks discussed with patient  - continue amsler grid monitoring  4. PVD / vitreous syneresis OU  Discussed findings and prognosis  No RT or RD on 360 scleral depressed exam  Reviewed s/s of RT/RD  Strict return precautions for any such RT/RD signs/symptoms  5. Pseudophakia OU  - s/p  CE/IOL OU  - beautiful surgeries, doing well  - monitor  6. PCO OU (OS > OD) - s/p YAG cap OU (09/2018) w/ Dr. Kathlen Mody - doing well, VA improved OU   Ophthalmic Meds Ordered this visit:  Meds ordered this encounter  Medications  . aflibercept (EYLEA) SOLN 2 mg       Return in about 6 weeks (around 05/02/2019) for f/u exu ARMD OU, DFE, OCT.  There are no Patient Instructions on file for this visit.   Explained the diagnoses, plan, and follow up with the patient and they expressed understanding.  Patient expressed understanding of the importance of proper follow up care.   This document serves as a record of services personally performed by Gardiner Sleeper, MD, PhD. It was created on their behalf by Ernest Mallick, OA, an ophthalmic assistant. The creation of this record is the provider's dictation and/or activities during the visit.    Electronically signed by: Ernest Mallick, OA  05.04.2020 12:55 PM     Gardiner Sleeper, M.D., Ph.D. Diseases & Surgery of the Retina and Vitreous [facility]   I have reviewed the above documentation for accuracy and completeness, and I agree with the above. Gardiner Sleeper, M.D., Ph.D. 03/21/19 12:55 PM     Abbreviations: M myopia (nearsighted); A astigmatism; H hyperopia (farsighted); P presbyopia; Mrx spectacle prescription;  CTL contact lenses; OD right eye; OS left eye; OU both eyes  XT exotropia; ET esotropia; PEK punctate epithelial keratitis; PEE punctate epithelial erosions; DES dry eye syndrome; MGD meibomian gland dysfunction; ATs artificial tears; PFAT's preservative free artificial tears; [place] nuclear sclerotic cataract; PSC posterior subcapsular cataract; ERM epi-retinal membrane; PVD posterior vitreous detachment; RD retinal detachment; DM diabetes mellitus; DR diabetic retinopathy; NPDR non-proliferative diabetic retinopathy; PDR proliferative diabetic retinopathy; CSME clinically significant macular edema; DME  diabetic macular edema; dbh dot blot hemorrhages; CWS cotton wool spot; POAG primary open angle glaucoma; C/D cup-to-disc ratio; HVF humphrey visual field; GVF goldmann visual field; OCT optical coherence tomography; IOP intraocular pressure; BRVO Branch retinal vein occlusion; CRVO central retinal vein occlusion; CRAO central retinal artery occlusion; BRAO branch retinal artery occlusion; RT retinal tear; SB scleral buckle; PPV pars plana vitrectomy; VH Vitreous hemorrhage; PRP panretinal laser photocoagulation; IVK intravitreal kenalog; VMT vitreomacular traction; MH Macular hole;  NVD neovascularization of the disc; NVE neovascularization elsewhere; AREDS age related eye disease study; ARMD age related macular degeneration; POAG primary open angle glaucoma; EBMD epithelial/anterior basement membrane dystrophy; ACIOL anterior chamber intraocular lens; IOL intraocular lens; PCIOL posterior chamber intraocular lens; Phaco/IOL phacoemulsification with intraocular lens placement; [place] photorefractive keratectomy; LASIK laser assisted in situ keratomileusis; HTN hypertension; DM diabetes mellitus; COPD chronic obstructive pulmonary disease

## 2019-03-22 DIAGNOSIS — I10 Essential (primary) hypertension: Secondary | ICD-10-CM | POA: Diagnosis not present

## 2019-04-12 DIAGNOSIS — H40013 Open angle with borderline findings, low risk, bilateral: Secondary | ICD-10-CM | POA: Diagnosis not present

## 2019-04-14 ENCOUNTER — Other Ambulatory Visit: Payer: Self-pay | Admitting: Pharmacist

## 2019-04-14 MED ORDER — HYDRALAZINE HCL 50 MG PO TABS: 50.0000 mg | ORAL_TABLET | Freq: Three times a day (TID) | ORAL | 3 refills | Status: DC

## 2019-04-14 NOTE — Addendum Note (Signed)
Addended by: Erskine Emery on: 04/14/2019 04:07 PM   Modules accepted: Orders

## 2019-04-25 ENCOUNTER — Other Ambulatory Visit: Payer: Self-pay

## 2019-04-25 ENCOUNTER — Encounter (HOSPITAL_COMMUNITY): Payer: Self-pay | Admitting: Emergency Medicine

## 2019-04-25 ENCOUNTER — Observation Stay (HOSPITAL_COMMUNITY)
Admission: EM | Admit: 2019-04-25 | Discharge: 2019-04-26 | Disposition: A | Discharge status: Home / Self Care | Payer: Medicare Other | Attending: Internal Medicine | Admitting: Internal Medicine

## 2019-04-25 ENCOUNTER — Encounter (INDEPENDENT_AMBULATORY_CARE_PROVIDER_SITE_OTHER): Payer: Medicare Other | Admitting: Ophthalmology

## 2019-04-25 ENCOUNTER — Observation Stay (HOSPITAL_BASED_OUTPATIENT_CLINIC_OR_DEPARTMENT_OTHER): Payer: Medicare Other

## 2019-04-25 ENCOUNTER — Observation Stay (HOSPITAL_COMMUNITY): Payer: Medicare Other

## 2019-04-25 ENCOUNTER — Emergency Department (HOSPITAL_COMMUNITY): Payer: Medicare Other

## 2019-04-25 DIAGNOSIS — E876 Hypokalemia: Secondary | ICD-10-CM | POA: Diagnosis present

## 2019-04-25 DIAGNOSIS — R0902 Hypoxemia: Secondary | ICD-10-CM | POA: Diagnosis not present

## 2019-04-25 DIAGNOSIS — E871 Hypo-osmolality and hyponatremia: Secondary | ICD-10-CM | POA: Diagnosis present

## 2019-04-25 DIAGNOSIS — E785 Hyperlipidemia, unspecified: Secondary | ICD-10-CM | POA: Diagnosis present

## 2019-04-25 DIAGNOSIS — Z20828 Contact with and (suspected) exposure to other viral communicable diseases: Secondary | ICD-10-CM | POA: Insufficient documentation

## 2019-04-25 DIAGNOSIS — R4701 Aphasia: Secondary | ICD-10-CM | POA: Insufficient documentation

## 2019-04-25 DIAGNOSIS — R2681 Unsteadiness on feet: Secondary | ICD-10-CM | POA: Diagnosis not present

## 2019-04-25 DIAGNOSIS — I6523 Occlusion and stenosis of bilateral carotid arteries: Secondary | ICD-10-CM | POA: Diagnosis not present

## 2019-04-25 DIAGNOSIS — R531 Weakness: Secondary | ICD-10-CM | POA: Diagnosis not present

## 2019-04-25 DIAGNOSIS — I669 Occlusion and stenosis of unspecified cerebral artery: Secondary | ICD-10-CM | POA: Diagnosis present

## 2019-04-25 DIAGNOSIS — G459 Transient cerebral ischemic attack, unspecified: Principal | ICD-10-CM | POA: Insufficient documentation

## 2019-04-25 DIAGNOSIS — Z79899 Other long term (current) drug therapy: Secondary | ICD-10-CM | POA: Diagnosis not present

## 2019-04-25 DIAGNOSIS — I639 Cerebral infarction, unspecified: Secondary | ICD-10-CM

## 2019-04-25 DIAGNOSIS — E039 Hypothyroidism, unspecified: Secondary | ICD-10-CM | POA: Diagnosis present

## 2019-04-25 DIAGNOSIS — I10 Essential (primary) hypertension: Secondary | ICD-10-CM | POA: Diagnosis not present

## 2019-04-25 DIAGNOSIS — R000 Tachycardia, unspecified: Secondary | ICD-10-CM | POA: Diagnosis not present

## 2019-04-25 LAB — I-STAT CHEM 8, ED
BUN: 11 mg/dL (ref 8–23)
Calcium, Ion: 1.01 mmol/L — ABNORMAL LOW (ref 1.15–1.40)
Chloride: 99 mmol/L (ref 98–111)
Creatinine, Ser: 0.8 mg/dL (ref 0.44–1.00)
Glucose, Bld: 123 mg/dL — ABNORMAL HIGH (ref 70–99)
HCT: 37 % (ref 36.0–46.0)
Hemoglobin: 12.6 g/dL (ref 12.0–15.0)
Potassium: 3.1 mmol/L — ABNORMAL LOW (ref 3.5–5.1)
Sodium: 133 mmol/L — ABNORMAL LOW (ref 135–145)
TCO2: 25 mmol/L (ref 22–32)

## 2019-04-25 LAB — DIFFERENTIAL
Abs Immature Granulocytes: 0.02 10*3/uL (ref 0.00–0.07)
Basophils Absolute: 0.1 10*3/uL (ref 0.0–0.1)
Basophils Relative: 1 %
Eosinophils Absolute: 0.2 10*3/uL (ref 0.0–0.5)
Eosinophils Relative: 4 %
Immature Granulocytes: 0 %
Lymphocytes Relative: 18 %
Lymphs Abs: 1.2 10*3/uL (ref 0.7–4.0)
Monocytes Absolute: 0.7 10*3/uL (ref 0.1–1.0)
Monocytes Relative: 12 %
Neutro Abs: 4.1 10*3/uL (ref 1.7–7.7)
Neutrophils Relative %: 65 %

## 2019-04-25 LAB — CBG MONITORING, ED: Glucose-Capillary: 111 mg/dL — ABNORMAL HIGH (ref 70–99)

## 2019-04-25 LAB — COMPREHENSIVE METABOLIC PANEL
ALT: 14 U/L (ref 0–44)
AST: 18 U/L (ref 15–41)
Albumin: 3.6 g/dL (ref 3.5–5.0)
Alkaline Phosphatase: 44 U/L (ref 38–126)
Anion gap: 12 (ref 5–15)
BUN: 11 mg/dL (ref 8–23)
CO2: 22 mmol/L (ref 22–32)
Calcium: 8.7 mg/dL — ABNORMAL LOW (ref 8.9–10.3)
Chloride: 99 mmol/L (ref 98–111)
Creatinine, Ser: 0.89 mg/dL (ref 0.44–1.00)
GFR calc Af Amer: >60 mL/min (ref >60)
GFR calc non Af Amer: >60 mL/min (ref >60)
Glucose, Bld: 126 mg/dL — ABNORMAL HIGH (ref 70–99)
Potassium: 3.2 mmol/L — ABNORMAL LOW (ref 3.5–5.1)
Sodium: 133 mmol/L — ABNORMAL LOW (ref 135–145)
Total Bilirubin: 0.8 mg/dL (ref 0.3–1.2)
Total Protein: 6.2 g/dL — ABNORMAL LOW (ref 6.5–8.1)

## 2019-04-25 LAB — PROTIME-INR
INR: 1 (ref 0.8–1.2)
Prothrombin Time: 13.4 seconds (ref 11.4–15.2)

## 2019-04-25 LAB — CBC
HCT: 37.8 % (ref 36.0–46.0)
Hemoglobin: 12.1 g/dL (ref 12.0–15.0)
MCH: 29.1 pg (ref 26.0–34.0)
MCHC: 32 g/dL (ref 30.0–36.0)
MCV: 90.9 fL (ref 80.0–100.0)
Platelets: 214 10*3/uL (ref 150–400)
RBC: 4.16 MIL/uL (ref 3.87–5.11)
RDW: 13.8 % (ref 11.5–15.5)
WBC: 6.3 10*3/uL (ref 4.0–10.5)
nRBC: 0 % (ref 0.0–0.2)

## 2019-04-25 LAB — ECHOCARDIOGRAM COMPLETE
Height: 64 in
Weight: 2208 oz

## 2019-04-25 LAB — APTT: aPTT: 27 seconds (ref 24–36)

## 2019-04-25 LAB — SARS CORONAVIRUS 2: SARS Coronavirus 2: NOT DETECTED

## 2019-04-25 MED ORDER — VITAMIN D 25 MCG (1000 UNIT) PO TABS
1000.0000 [IU] | ORAL_TABLET | Freq: Every morning | ORAL | Status: DC
  Administered 2019-04-26: 1000 [IU] via ORAL
  Filled 2019-04-25: qty 1

## 2019-04-25 MED ORDER — SODIUM CHLORIDE 0.9% FLUSH: 3.0000 mL | Freq: Once | INTRAVENOUS | Status: DC

## 2019-04-25 MED ORDER — ACETAMINOPHEN 160 MG/5ML PO SOLN: 650.0000 mg | ORAL | Status: DC | PRN

## 2019-04-25 MED ORDER — CLOPIDOGREL BISULFATE 300 MG PO TABS
300.0000 mg | ORAL_TABLET | Freq: Once | ORAL | Status: AC
  Administered 2019-04-25: 10:00:00 300 mg via ORAL
  Filled 2019-04-25: qty 1

## 2019-04-25 MED ORDER — LORATADINE 10 MG PO TABS
10.0000 mg | ORAL_TABLET | Freq: Every day | ORAL | Status: DC
  Filled 2019-04-25: qty 1

## 2019-04-25 MED ORDER — POTASSIUM CHLORIDE 20 MEQ/15ML (10%) PO SOLN
40.0000 meq | Freq: Once | ORAL | Status: DC
  Filled 2019-04-25: qty 30

## 2019-04-25 MED ORDER — CLOPIDOGREL BISULFATE 75 MG PO TABS
75.0000 mg | ORAL_TABLET | Freq: Every day | ORAL | Status: DC
  Administered 2019-04-26: 75 mg via ORAL
  Filled 2019-04-25: qty 1

## 2019-04-25 MED ORDER — ASPIRIN EC 81 MG PO TBEC
81.0000 mg | DELAYED_RELEASE_TABLET | Freq: Every day | ORAL | Status: DC
  Administered 2019-04-25 – 2019-04-26 (×2): 81 mg via ORAL
  Filled 2019-04-25 (×2): qty 1

## 2019-04-25 MED ORDER — ENOXAPARIN SODIUM 40 MG/0.4ML ~~LOC~~ SOLN
40.0000 mg | SUBCUTANEOUS | Status: DC
  Administered 2019-04-25: 40 mg via SUBCUTANEOUS
  Filled 2019-04-25: qty 0.4

## 2019-04-25 MED ORDER — HYDRALAZINE HCL 20 MG/ML IJ SOLN: 10.0000 mg | INTRAMUSCULAR | Status: DC | PRN

## 2019-04-25 MED ORDER — SENNOSIDES-DOCUSATE SODIUM 8.6-50 MG PO TABS: 1.0000 | ORAL_TABLET | Freq: Every evening | ORAL | Status: DC | PRN

## 2019-04-25 MED ORDER — ACETAMINOPHEN 325 MG PO TABS: 650.0000 mg | ORAL_TABLET | ORAL | Status: DC | PRN

## 2019-04-25 MED ORDER — LEVOTHYROXINE SODIUM 88 MCG PO TABS
88.0000 ug | ORAL_TABLET | ORAL | Status: DC
  Administered 2019-04-26: 88 ug via ORAL
  Filled 2019-04-25: qty 1

## 2019-04-25 MED ORDER — PANTOPRAZOLE SODIUM 40 MG PO TBEC
40.0000 mg | DELAYED_RELEASE_TABLET | Freq: Every morning | ORAL | Status: DC
  Administered 2019-04-25 – 2019-04-26 (×2): 40 mg via ORAL
  Filled 2019-04-25 (×2): qty 1

## 2019-04-25 MED ORDER — PROSIGHT PO TABS
1.0000 | ORAL_TABLET | Freq: Every day | ORAL | Status: DC
  Administered 2019-04-26: 1 via ORAL
  Filled 2019-04-25 (×2): qty 1

## 2019-04-25 MED ORDER — POTASSIUM CHLORIDE CRYS ER 20 MEQ PO TBCR
40.0000 meq | EXTENDED_RELEASE_TABLET | Freq: Once | ORAL | Status: AC
  Administered 2019-04-25: 40 meq via ORAL
  Filled 2019-04-25: qty 2

## 2019-04-25 MED ORDER — STROKE: EARLY STAGES OF RECOVERY BOOK
Freq: Once | Status: AC
  Administered 2019-04-25: 18:00:00
  Filled 2019-04-25: qty 1

## 2019-04-25 MED ORDER — SACCHAROMYCES BOULARDII 250 MG PO CAPS
250.0000 mg | ORAL_CAPSULE | Freq: Every day | ORAL | Status: DC
  Administered 2019-04-26: 250 mg via ORAL
  Filled 2019-04-25: qty 1

## 2019-04-25 MED ORDER — IOHEXOL 350 MG/ML SOLN
50.0000 mL | Freq: Once | INTRAVENOUS | Status: AC | PRN
  Administered 2019-04-25: 50 mL via INTRAVENOUS

## 2019-04-25 MED ORDER — ACETAMINOPHEN 650 MG RE SUPP: 650.0000 mg | RECTAL | Status: DC | PRN

## 2019-04-25 NOTE — ED Provider Notes (Signed)
[facility] EMERGENCY DEPARTMENT Provider Note   CSN: [id number] Arrival date & time: 04/25/19  9892    History   Chief Complaint Chief Complaint  Patient presents with  . Weakness    HPI KENNETHA PEARMAN is a 84 y.o. female.     HPI  84 year old female arrived via [facility] with reports of left-sided weakness.  Patient states that she awoke at 6 AM and was able to walk to the bathroom and had no weakness.  When she woke up at 7:30 AM she was weak on her left side and was unable to move out of the bed.  Her husband helped her moved to the ground.  EMS was called.  She reports having improvement of her symptoms.  EMS reports that symptoms had almost completely resolved on their arrival.  Denies any history of seizures, stroke, previous weakness, or diabetes.  She does have a history of hypertension has not taken her antihypertensives this morning.  None she denies any history of cough, chills, or COVID exposure  Past Medical History:  Diagnosis Date  . Arthritis   . GERD (gastroesophageal reflux disease)   . H/O hiatal hernia   . Hyperlipidemia   . Hypertension   . Hypothyroidism   . Irritable bowel syndrome   . Macular degeneration    Exudative - OD    Patient Active Problem List   Diagnosis Date Noted  . Snoring 03/17/2019  . Nausea alone 05/31/2014  . Malnutrition of moderate degree ([place]) 05/30/2014  . Weakness 05/29/2014  . Tachycardia 05/29/2014  . HNP (herniated nucleus pulposus), lumbar 05/25/2014  . Electrolyte abnormality 05/12/2014  . Hypokalemia 05/12/2014  . Hyponatremia 05/12/2014  . Lumbosacral radiculopathy 05/12/2014  . Hematuria 05/12/2014  . Benign essential hypertension 05/12/2014  . Hyperlipidemia LDL goal <100 05/12/2014  . Hypothyroid 05/12/2014    Past Surgical History:  Procedure Laterality Date  . CATARACT EXTRACTION    . EYE SURGERY Bilateral    cataract surgery w/ lens implant  . FRACTURE SURGERY     right hip  . LUMBAR LAMINECTOMY/DECOMPRESSION MICRODISCECTOMY Left 05/25/2014   Procedure: LUMBAR LAMINECTOMY/DECOMPRESSION MICRODISCECTOMY 1 LEVEL   lumbar three;  Surgeon: Hosie Spangle, MD;  Location: [facility];  Service: Neurosurgery;  Laterality: Left;  . THYROID SURGERY       OB History   No obstetric history on file.      Home Medications    Prior to Admission medications   Medication Sig Start Date End Date Taking? Authorizing Provider  cetirizine (ZYRTEC) 10 MG tablet Take 5 mg by mouth daily as needed for allergies.    [provider]  cholecalciferol (VITAMIN D) 1000 UNITS tablet Take 1,000 Units by mouth every morning.     [provider]  hydrALAZINE (APRESOLINE) 50 MG tablet Take 1 tablet (50 mg total) by mouth 3 (three) times daily. 04/14/19   Belva Crome, MD  levothyroxine (SYNTHROID) 88 MCG tablet Take 88 mcg by mouth daily before breakfast.     [provider]  losartan (COZAAR) 100 MG tablet Take 100 mg by mouth daily. 08/30/18   [provider]  metoprolol tartrate (LOPRESSOR) 100 MG tablet Take 1 tablet (100 mg total) by mouth 2 (two) times daily. 01/12/19   Belva Crome, MD  Multiple Vitamins-Minerals (PRESERVISION AREDS 2 PO) Take 1 capsule by mouth daily.    [provider]  pantoprazole (PROTONIX) 40 MG tablet Take 40 mg by mouth  every morning.     [provider]  Probiotic Product (ALIGN) 4 MG CAPS Take 4 mg by mouth daily.    [provider]    Family History Family History  Problem Relation Age of Onset  . Macular degeneration Mother     Social History Social History   Tobacco Use  . Smoking status: Never Smoker  . Smokeless tobacco: Never Used  Substance Use Topics  . Alcohol use: No  . Drug use: Never     Allergies   Carvedilol and Sulfa antibiotics   Review of Systems Review of Systems  All other systems reviewed and are negative.    Physical Exam Updated  Vital Signs BP (!) 177/69   Pulse 79   Resp 14   Ht 1.626 m (5\' 4" )   Wt 62.6 kg   SpO2 97%   BMI 23.69 kg/m   Physical Exam Vitals signs and nursing note reviewed.  Constitutional:      Appearance: Normal appearance.  HENT:     Head: Normocephalic.     Right Ear: External ear normal.     Left Ear: External ear normal.     Nose: Nose normal.     Mouth/Throat:     Mouth: Mucous membranes are moist.     Pharynx: Oropharynx is clear.  Eyes:     Extraocular Movements: Extraocular movements intact.     Pupils: Pupils are equal, round, and reactive to light.  Neck:     Musculoskeletal: Normal range of motion.  Cardiovascular:     Rate and Rhythm: Normal rate and regular rhythm.  Pulmonary:     Effort: Pulmonary effort is normal.     Breath sounds: Normal breath sounds.  Abdominal:     General: Abdomen is flat.     Palpations: Abdomen is soft.  Musculoskeletal: Normal range of motion.  Skin:    General: Skin is warm and dry.     Capillary Refill: Capillary refill takes less than 2 seconds.  Neurological:     Mental Status: She is alert and oriented to person, place, and time.     Cranial Nerves: No cranial nerve deficit.     Sensory: No sensory deficit.     Motor: Weakness present.     Coordination: Coordination normal.     Deep Tendon Reflexes: Reflexes normal.     Comments: Mild left palmar drift  Psychiatric:        Mood and Affect: Mood normal.      ED Treatments / Results  Labs (all labs ordered are listed, but only abnormal results are displayed) Labs Reviewed  COMPREHENSIVE METABOLIC PANEL - Abnormal; Notable for the following components:      Result Value   Sodium 133 (*)    Potassium 3.2 (*)    Glucose, Bld 126 (*)    Calcium 8.7 (*)    Total Protein 6.2 (*)    All other components within normal limits  I-STAT CHEM 8, ED - Abnormal; Notable for the following components:   Sodium 133 (*)    Potassium 3.1 (*)    Glucose, Bld 123 (*)    Calcium, Ion  1.01 (*)    All other components within normal limits  PROTIME-INR  APTT  CBC  DIFFERENTIAL  CBG MONITORING, ED    EKG EKG Interpretation  Date/Time:  Monday April 25 2019 08:41:08 EDT Ventricular Rate:  90 PR Interval:    QRS Duration: 75 QT Interval:  383 QTC Calculation:  469 R Axis:   62 Text Interpretation:  Sinus rhythm Normal sinus rhythm Confirmed by Pattricia Boss [phone]) on 04/25/2019 10:47:30 AM   Radiology No results found.  Procedures Procedures (including critical care time)  Medications Ordered in ED Medications  sodium chloride flush (NS) 0.9 % injection 3 mL (has no administration in time range)  clopidogrel (PLAVIX) tablet 300 mg (has no administration in time range)  aspirin EC tablet 81 mg (has no administration in time range)  clopidogrel (PLAVIX) tablet 75 mg (has no administration in time range)     Initial Impression / Assessment and Plan / ED Course  I have reviewed the triage vital signs and the nursing notes.  Pertinent labs & imaging results that were available during my care of the patient were reviewed by me and considered in my medical decision making (see chart for details).    84 year old female presents today with left-sided weakness which has resolved prior to arrival at hospital.  Patient has been stable here in ED.  Neurology has been consulted please see their full note.  Plan admission for further evaluation and treatment Discussed with Drs. Sherrilyn Rist.  Dr. Evangeline Gula will admit Final Clinical Impressions(s) / ED Diagnoses   Final diagnoses:  TIA (transient ischemic attack)    ED Discharge Orders    None       Pattricia Boss, MD 04/25/19 1232

## 2019-04-25 NOTE — ED Notes (Signed)
Pt placed on bedpan

## 2019-04-25 NOTE — ED Notes (Signed)
Pt plkaced on bedpan

## 2019-04-25 NOTE — ED Notes (Signed)
ED TO INPATIENT HANDOFF REPORT  ED Nurse Name and Phone #: [facility] [phone]  S Name/Age/Gender Tammy Allison 84 y.o. female Room/Bed: 030C/030C  Code Status   Code Status: Full Code  Home/SNF/Other Home Patient oriented to: self, place, time and situation Is this baseline? Yes   Triage Complete: Triage complete  Chief Complaint sick  Triage Note Pt is coming by [facility] from home where she lives with her husband - pt woke up this morning and noticed she was weak on the left side of her body- on ems arrival pt only had left arm weakness with clear speech able to ambulate. On arrival to ed pt has mild palmar drift in left arm. Pt is alert and ox4. No weakness in lower extremities.    Allergies Allergies  Allergen Reactions  . Sulfa Antibiotics Hives  . Carvedilol Rash    Level of Care/Admitting Diagnosis ED Disposition    ED Disposition Condition [facility] Area: [place] [[id number]]  Level of Care: Telemetry Medical [104]  I expect the patient will be discharged within 24 hours: Yes  LOW acuity---Tx typically complete <24 hrs---ACUTE conditions typically can be evaluated <24 hours---LABS likely to return to acceptable levels <24 hours---IS near functional baseline---EXPECTED to return to current living arrangement---NOT newly hypoxic: Meets criteria for 5C-Observation unit  Covid Evaluation: Screening Protocol (No Symptoms)  Diagnosis: Cerebral thrombosis with transient ischemic attack (TIA) [884166]  Admitting Physician: Lady Deutscher [[id number]]  Attending Physician: Lady Deutscher [phone]  PT Class (Do Not Modify): Observation [104]  PT Acc Code (Do Not Modify): Observation [10022]       B Medical/Surgery History Past Medical History:  Diagnosis Date  . Arthritis   . GERD (gastroesophageal reflux disease)   . H/O hiatal hernia   . Hyperlipidemia   . Hypertension   . Hypothyroidism   . Irritable bowel syndrome   .  Macular degeneration    Exudative - OD   Past Surgical History:  Procedure Laterality Date  . CATARACT EXTRACTION    . EYE SURGERY Bilateral    cataract surgery w/ lens implant  . FRACTURE SURGERY     right hip  . LUMBAR LAMINECTOMY/DECOMPRESSION MICRODISCECTOMY Left 05/25/2014   Procedure: LUMBAR LAMINECTOMY/DECOMPRESSION MICRODISCECTOMY 1 LEVEL   lumbar three;  Surgeon: Hosie Spangle, MD;  Location: [facility];  Service: Neurosurgery;  Laterality: Left;  . THYROID SURGERY       A IV Location/Drains/Wounds Patient Lines/Drains/Airways Status   Active Line/Drains/Airways    Name:   Placement date:   Placement time:   Site:   Days:   Peripheral IV 05/29/14 Left Hand   05/29/14    -    Hand   1792   Peripheral IV 04/25/19 Left Antecubital   04/25/19    0955    Antecubital   less than 1   Incision (Closed) 05/25/14 Back Other (Comment)   05/25/14    1235     1796          Intake/Output Last 24 hours  Intake/Output Summary (Last 24 hours) at 04/25/2019 1459 Last data filed at 04/25/2019 1040 Gross per 24 hour  Intake -  Output 300 ml  Net -300 ml    Labs/Imaging Results for orders placed or performed during the hospital encounter of 04/25/19 (from the past 48 hour(s))  Protime-INR     Status: None   Collection Time: 04/25/19  8:42 AM  Result Value Ref  Range   Prothrombin Time 13.4 11.4 - 15.2 seconds   INR 1.0 0.8 - 1.2    Comment: (NOTE) INR goal varies based on device and disease states. Performed at [facility], [place] [address], [place], [place] [address]   APTT     Status: None   Collection Time: 04/25/19  8:42 AM  Result Value Ref Range   aPTT 27 24 - 36 seconds    Comment: Performed at [facility] [address], [place], [place] [address]  CBC     Status: None   Collection Time: 04/25/19  8:42 AM  Result Value Ref Range   WBC 6.3 4.0 - 10.5 K/uL   RBC 4.16 3.87 - 5.11 MIL/uL   Hemoglobin 12.1 12.0 - 15.0 g/dL   HCT 37.8 36.0 - 46.0 %    MCV 90.9 80.0 - 100.0 fL   MCH 29.1 26.0 - 34.0 pg   MCHC 32.0 30.0 - 36.0 g/dL   RDW 13.8 11.5 - 15.5 %   Platelets 214 150 - 400 K/uL   nRBC 0.0 0.0 - 0.2 %    Comment: Performed at [facility], [place] [address], [place], [place] [address]  Differential     Status: None   Collection Time: 04/25/19  8:42 AM  Result Value Ref Range   Neutrophils Relative % 65 %   Neutro Abs 4.1 1.7 - 7.7 K/uL   Lymphocytes Relative 18 %   Lymphs Abs 1.2 0.7 - 4.0 K/uL   Monocytes Relative 12 %   Monocytes Absolute 0.7 0.1 - 1.0 K/uL   Eosinophils Relative 4 %   Eosinophils Absolute 0.2 0.0 - 0.5 K/uL   Basophils Relative 1 %   Basophils Absolute 0.1 0.0 - 0.1 K/uL   Immature Granulocytes 0 %   Abs Immature Granulocytes 0.02 0.00 - 0.07 K/uL    Comment: Performed at [facility] [address] [place], [place] [address]  Comprehensive metabolic panel     Status: Abnormal   Collection Time: 04/25/19  8:42 AM  Result Value Ref Range   Sodium 133 (L) 135 - 145 mmol/L   Potassium 3.2 (L) 3.5 - 5.1 mmol/L   Chloride 99 98 - 111 mmol/L   CO2 22 22 - 32 mmol/L   Glucose, Bld 126 (H) 70 - 99 mg/dL   BUN 11 8 - 23 mg/dL   Creatinine, Ser 0.89 0.44 - 1.00 mg/dL   Calcium 8.7 (L) 8.9 - 10.3 mg/dL   Total Protein 6.2 (L) 6.5 - 8.1 g/dL   Albumin 3.6 3.5 - 5.0 g/dL   AST 18 15 - 41 U/L   ALT 14 0 - 44 U/L   Alkaline Phosphatase 44 38 - 126 U/L   Total Bilirubin 0.8 0.3 - 1.2 mg/dL   GFR calc non Af Amer >60 >60 mL/min   GFR calc Af Amer >60 >60 mL/min   Anion gap 12 5 - 15    Comment: Performed at [facility], [place] [address] [place], [place] [address]  I-stat chem 8, ED     Status: Abnormal   Collection Time: 04/25/19  8:51 AM  Result Value Ref Range   Sodium 133 (L) 135 - 145 mmol/L   Potassium 3.1 (L) 3.5 - 5.1 mmol/L   Chloride 99 98 - 111 mmol/L   BUN 11 8 - 23 mg/dL   Creatinine, Ser 0.80 0.44 - 1.00 mg/dL   Glucose, Bld  123 (H) 70 - 99 mg/dL   Calcium, Ion 1.01 (L)  1.15 - 1.40 mmol/L   TCO2 25 22 - 32 mmol/L   Hemoglobin 12.6 12.0 - 15.0 g/dL   HCT 37.0 36.0 - 46.0 %  CBG monitoring, ED     Status: Abnormal   Collection Time: 04/25/19 10:15 AM  Result Value Ref Range   Glucose-Capillary 111 (H) 70 - 99 mg/dL  SARS Coronavirus 2     Status: None   Collection Time: 04/25/19 11:52 AM  Result Value Ref Range   SARS Coronavirus 2 NOT DETECTED NOT DETECTED    Comment: (NOTE) SARS-CoV-2 target nucleic acids are NOT DETECTED. The SARS-CoV-2 RNA is generally detectable in upper and lower respiratory specimens during the acute phase of infection.  Negative  results do not preclude SARS-CoV-2 infection, do not rule out co-infections with other pathogens, and should not be used as the sole basis for treatment or other patient management decisions.  Negative results must be combined with clinical observations, patient history, and epidemiological information. The expected result is Not Detected. Fact Sheet for Patients: http://www.biofiredefense.com/wp-content/uploads/2020/03/BIOFIRE-COVID -19-patients.pdf Fact Sheet for Healthcare Providers: http://www.biofiredefense.com/wp-content/uploads/2020/03/BIOFIRE-COVID -19-hcp.pdf This test is not yet approved or cleared by the [place] and  has been authorized for detection and/or diagnosis of SARS-CoV-2 by FDA under an Emergency Use Authorization (EUA).  This EUA will remain in effec t (meaning this test can be used) for the duration of  the COVID-19 declaration under Section 564(b)(1) of the Act, 21 U.S.C. section 360bbb-3(b)(1), unless the authorization is terminated or revoked sooner. Performed at [facility], [place] [address] [place], [place] [address]    Ct Angio Head W Or Wo Contrast  Result Date: 04/25/2019 CLINICAL DATA:  Acute onset of left-sided weakness beginning this morning. EXAM: CT ANGIOGRAPHY HEAD AND NECK TECHNIQUE: Multidetector CT imaging of the head and neck was  performed using the standard protocol during bolus administration of intravenous contrast. Multiplanar CT image reconstructions and MIPs were obtained to evaluate the vascular anatomy. Carotid stenosis measurements (when applicable) are obtained utilizing NASCET criteria, using the distal internal carotid diameter as the denominator. CONTRAST:  8mL OMNIPAQUE IOHEXOL 350 MG/ML SOLN COMPARISON:  None. FINDINGS: CT HEAD FINDINGS Brain: Lacunar infarcts in the basal ganglia bilaterally appear remote. There is also a remote lacunar infarct in the right centrum semi ovale moderate atrophy and white matter disease is present bilaterally. The ventricles are proportionate to the degree of atrophy. The brainstem and cerebellum are within normal limits. No acute hemorrhage or mass lesion is present. No significant extraaxial fluid collection is present. Vascular: Dense atherosclerotic calcifications are present in the cavernous internal carotid arteries bilaterally. There is no hyperdense vessel. Skull: Calvarium is intact. No focal lytic or blastic lesions are present. Sinuses: There is some fluid in chronic sclerosis in the right mastoid air cells. No obstructing nasopharyngeal lesion is present. The paranasal sinuses and mastoid air cells are otherwise clear. Orbits: Bilateral lens replacements are noted. Globes and orbits are otherwise unremarkable. Review of the MIP images confirms the above findings CTA NECK FINDINGS Aortic arch: Atherosclerotic changes are noted at the aortic arch. There is no aneurysm. There is no significant stenosis of the great vessel origins. A 3 vessel arch configuration is present. Right carotid system: The right common carotid artery is within normal limits. Bifurcation demonstrates mild atherosclerotic changes without significant stenosis. There is beaded irregularity of the mid cervical right ICA without focal stenosis or dissection. Left carotid  system: The left common carotid artery is  within normal limits. Minimal atherosclerotic changes are noted at the bifurcation without a significant stenosis. There is beaded irregularity of the mid cervical left ICA without focal stenosis or dissection. Vertebral arteries: Vertebral arteries are codominant. Both vertebral arteries originate from the subclavian arteries without a significant stenosis at the origin. There is no focal stenosis or injury to either vertebral artery in the neck. No focal vascular irregularity is present. Skeleton: Multilevel endplate degenerative changes are noted in the cervical spine. Uncovertebral spurring contributes to foraminal narrowing bilaterally at C5-6 and C6-7. No focal lytic or blastic lesions are present. Other neck: No focal mucosal or submucosal lesions are present. Salivary glands are within normal limits. No significant adenopathy is present. The thyroid is either markedly atrophic or has been partially resected. There is some residual thyroid tissue on the left. Minimal tissue is present on the right. Upper chest: Calcific scarring is present at the left lung apex. Lungs are otherwise clear. No focal nodule, mass, or airspace disease is present. Thoracic inlet is within normal limits. Review of the MIP images confirms the above findings CTA HEAD FINDINGS Anterior circulation: Atherosclerotic calcifications are present in the cavernous internal carotid arteries. There is no significant stenosis from the skull base through the ICA termini. The A1 and M1 segments are normal. The anterior communicating artery is patent. The MCA bifurcations are intact. There is some attenuation of distal MCA and ACA branch vessels without a significant proximal stenosis, aneurysm, or branch vessel occlusion. Posterior circulation: The left vertebral artery is slightly dominant. PICA origins are visualized and normal. The basilar artery is small. P1 segments contribute with prominent posterior communicating arteries bilaterally to  the posterior cerebral arteries. There is a high-grade stenosis of the proximal left P2 segment. PCA branch vessels are intact bilaterally. Venous sinuses: The dural sinuses are patent. Straight sinus and deep cerebral veins are intact. Cortical veins are unremarkable. Anatomic variants: Prominent posterior communicating arteries bilaterally. Review of the MIP images confirms the above findings IMPRESSION: 1. No acute intracranial abnormality. 2. Lacunar infarcts of the basal ganglia appear remote. 3. High-grade stenosis of the left P2 segment. 4. No emergent large vessel occlusion or other significant stenosis in the proximal circle-of-Willis. 5. Mild distal small vessel disease throughout the circle-of-Willis. 6. Beaded irregularity of the cervical internal carotid arteries bilaterally consistent with fibromuscular dysplasia. There is no significant stenosis or dissection. Electronically Signed   By: San Morelle M.D.   On: 04/25/2019 12:02   Ct Angio Neck W Or Wo Contrast  Result Date: 04/25/2019 CLINICAL DATA:  Acute onset of left-sided weakness beginning this morning. EXAM: CT ANGIOGRAPHY HEAD AND NECK TECHNIQUE: Multidetector CT imaging of the head and neck was performed using the standard protocol during bolus administration of intravenous contrast. Multiplanar CT image reconstructions and MIPs were obtained to evaluate the vascular anatomy. Carotid stenosis measurements (when applicable) are obtained utilizing NASCET criteria, using the distal internal carotid diameter as the denominator. CONTRAST:  67mL OMNIPAQUE IOHEXOL 350 MG/ML SOLN COMPARISON:  None. FINDINGS: CT HEAD FINDINGS Brain: Lacunar infarcts in the basal ganglia bilaterally appear remote. There is also a remote lacunar infarct in the right centrum semi ovale moderate atrophy and white matter disease is present bilaterally. The ventricles are proportionate to the degree of atrophy. The brainstem and cerebellum are within normal  limits. No acute hemorrhage or mass lesion is present. No significant extraaxial fluid collection is present. Vascular: Dense atherosclerotic calcifications are present  in the cavernous internal carotid arteries bilaterally. There is no hyperdense vessel. Skull: Calvarium is intact. No focal lytic or blastic lesions are present. Sinuses: There is some fluid in chronic sclerosis in the right mastoid air cells. No obstructing nasopharyngeal lesion is present. The paranasal sinuses and mastoid air cells are otherwise clear. Orbits: Bilateral lens replacements are noted. Globes and orbits are otherwise unremarkable. Review of the MIP images confirms the above findings CTA NECK FINDINGS Aortic arch: Atherosclerotic changes are noted at the aortic arch. There is no aneurysm. There is no significant stenosis of the great vessel origins. A 3 vessel arch configuration is present. Right carotid system: The right common carotid artery is within normal limits. Bifurcation demonstrates mild atherosclerotic changes without significant stenosis. There is beaded irregularity of the mid cervical right ICA without focal stenosis or dissection. Left carotid system: The left common carotid artery is within normal limits. Minimal atherosclerotic changes are noted at the bifurcation without a significant stenosis. There is beaded irregularity of the mid cervical left ICA without focal stenosis or dissection. Vertebral arteries: Vertebral arteries are codominant. Both vertebral arteries originate from the subclavian arteries without a significant stenosis at the origin. There is no focal stenosis or injury to either vertebral artery in the neck. No focal vascular irregularity is present. Skeleton: Multilevel endplate degenerative changes are noted in the cervical spine. Uncovertebral spurring contributes to foraminal narrowing bilaterally at C5-6 and C6-7. No focal lytic or blastic lesions are present. Other neck: No focal mucosal or  submucosal lesions are present. Salivary glands are within normal limits. No significant adenopathy is present. The thyroid is either markedly atrophic or has been partially resected. There is some residual thyroid tissue on the left. Minimal tissue is present on the right. Upper chest: Calcific scarring is present at the left lung apex. Lungs are otherwise clear. No focal nodule, mass, or airspace disease is present. Thoracic inlet is within normal limits. Review of the MIP images confirms the above findings CTA HEAD FINDINGS Anterior circulation: Atherosclerotic calcifications are present in the cavernous internal carotid arteries. There is no significant stenosis from the skull base through the ICA termini. The A1 and M1 segments are normal. The anterior communicating artery is patent. The MCA bifurcations are intact. There is some attenuation of distal MCA and ACA branch vessels without a significant proximal stenosis, aneurysm, or branch vessel occlusion. Posterior circulation: The left vertebral artery is slightly dominant. PICA origins are visualized and normal. The basilar artery is small. P1 segments contribute with prominent posterior communicating arteries bilaterally to the posterior cerebral arteries. There is a high-grade stenosis of the proximal left P2 segment. PCA branch vessels are intact bilaterally. Venous sinuses: The dural sinuses are patent. Straight sinus and deep cerebral veins are intact. Cortical veins are unremarkable. Anatomic variants: Prominent posterior communicating arteries bilaterally. Review of the MIP images confirms the above findings IMPRESSION: 1. No acute intracranial abnormality. 2. Lacunar infarcts of the basal ganglia appear remote. 3. High-grade stenosis of the left P2 segment. 4. No emergent large vessel occlusion or other significant stenosis in the proximal circle-of-Willis. 5. Mild distal small vessel disease throughout the circle-of-Willis. 6. Beaded irregularity of  the cervical internal carotid arteries bilaterally consistent with fibromuscular dysplasia. There is no significant stenosis or dissection. Electronically Signed   By: San Morelle M.D.   On: 04/25/2019 12:02   Mr Brain Wo Contrast  Result Date: 04/25/2019 CLINICAL DATA:  Hypertension and hyperlipidemia. Acute presentation with left-sided weakness. EXAM: MRI  HEAD WITHOUT CONTRAST TECHNIQUE: Multiplanar, multiecho pulse sequences of the brain and surrounding structures were obtained without intravenous contrast. COMPARISON:  CT studies earlier same day. FINDINGS: Brain: Diffusion imaging does not show any acute or subacute infarction. Brainstem and cerebellum are normal. Cerebral hemispheres show old small vessel infarctions affecting the thalami, basal ganglia and hemispheric deep and subcortical white matter. No large vessel territory infarction. No mass lesion, recent hemorrhage, hydrocephalus or extra-axial collection. Vascular: Major vessels at the base of the brain show flow. Skull and upper cervical spine: Negative Sinuses/Orbits: Paranasal sinuses are clear. Orbits are negative. There is an extensive mastoid effusion on the right. Other: None IMPRESSION: No acute intracranial finding by MRI. Extensive chronic small-vessel ischemic changes affecting the thalami, basal ganglia and hemispheric white matter. Extensive mastoid effusion on the right. Electronically Signed   By: Nelson Chimes M.D.   On: 04/25/2019 14:09    Pending Labs Unresulted Labs (From admission, onward)    Start     Ordered   05/02/19 0500  Creatinine, serum  (enoxaparin (LOVENOX)    CrCl >/= 30 ml/min)  Weekly,   R    Comments:  while on enoxaparin therapy    04/25/19 1256   04/26/19 0500  Hemoglobin A1c  Tomorrow morning,   R     04/25/19 1256   04/26/19 0500  Lipid panel  Tomorrow morning,   R    Comments:  Fasting    04/25/19 1256   04/25/19 1255  Urine rapid drug screen (hosp performed)not at [facility]  Add-on,   R      04/25/19 1256   04/25/19 1252  CBC  (enoxaparin (LOVENOX)    CrCl >/= 30 ml/min)  Once,   R    Comments:  Baseline for enoxaparin therapy IF NOT ALREADY DRAWN.  Notify MD if PLT < 100 K.    04/25/19 1256   04/25/19 1252  Creatinine, serum  (enoxaparin (LOVENOX)    CrCl >/= 30 ml/min)  Once,   R    Comments:  Baseline for enoxaparin therapy IF NOT ALREADY DRAWN.    04/25/19 1256          Vitals/Pain Today's Vitals   04/25/19 1416 04/25/19 1420 04/25/19 1430 04/25/19 1445  BP: (!) 181/73  (!) 161/76 (!) 179/148  Pulse: 89  87 89  Resp: 18  (!) 29 (!) 22  SpO2: 99%  98% 99%  Weight:      Height:      PainSc: 0-No pain 0-No pain      Isolation Precautions No active isolations  Medications Medications  sodium chloride flush (NS) 0.9 % injection 3 mL (3 mLs Intravenous Not Given 04/25/19 0955)  aspirin EC tablet 81 mg (81 mg Oral Given 04/25/19 1013)  clopidogrel (PLAVIX) tablet 75 mg (has no administration in time range)  potassium chloride 20 MEQ/15ML (10%) solution 40 mEq (40 mEq Oral Refused 04/25/19 1110)  levothyroxine (SYNTHROID) tablet 88 mcg (has no administration in time range)  pantoprazole (PROTONIX) EC tablet 40 mg (40 mg Oral Given 04/25/19 1440)  Align CAPS 4 mg (has no administration in time range)  cholecalciferol (VITAMIN D) tablet 1,000 Units (has no administration in time range)  PreserVision AREDS 2 CAPS (has no administration in time range)  loratadine (CLARITIN) tablet 10 mg (has no administration in time range)   stroke: mapping our early stages of recovery book (has no administration in time range)  acetaminophen (TYLENOL) tablet 650 mg (has no administration in time  range)    Or  acetaminophen (TYLENOL) solution 650 mg (has no administration in time range)    Or  acetaminophen (TYLENOL) suppository 650 mg (has no administration in time range)  senna-docusate (Senokot-S) tablet 1 tablet (has no administration in time range)  enoxaparin (LOVENOX) injection  40 mg (has no administration in time range)  hydrALAZINE (APRESOLINE) injection 10 mg (has no administration in time range)  clopidogrel (PLAVIX) tablet 300 mg (300 mg Oral Given 04/25/19 1013)  iohexol (OMNIPAQUE) 350 MG/ML injection 50 mL (50 mLs Intravenous Contrast Given 04/25/19 1103)  potassium chloride SA (K-DUR) CR tablet 40 mEq (40 mEq Oral Given 04/25/19 1220)    Mobility walks Low fall risk   Focused Assessments Neuro Assessment Handoff:  Swallow screen pass? Yes  Cardiac Rhythm: Normal sinus rhythm NIH Stroke Scale ( + Modified Stroke Scale Criteria)  Interval: Other (Comment) Level of Consciousness (1a.)   : Alert, keenly responsive LOC Questions (1b. )   +: Answers both questions correctly LOC Commands (1c. )   + : Performs both tasks correctly Best Gaze (2. )  +: Normal Visual (3. )  +: No visual loss Facial Palsy (4. )    : Normal symmetrical movements Motor Arm, Left (5a. )   +: No drift Motor Arm, Right (5b. )   +: No drift Motor Leg, Left (6a. )   +: No drift Motor Leg, Right (6b. )   +: No drift Limb Ataxia (7. ): Absent Sensory (8. )   +: Normal, no sensory loss Best Language (9. )   +: No aphasia Dysarthria (10. ): Normal Extinction/Inattention (11.)   +: No Abnormality Modified SS Total  +: 0 Complete NIHSS TOTAL: 0 Last date known well: 04/25/19   Neuro Assessment: Exceptions to [facility] Neuro Checks:   Initial (04/25/19 0847)  Last Documented NIHSS Modified Score: 0 (04/25/19 1441) Has TPA been given? No If patient is a Neuro Trauma and patient is going to OR before floor call report to [facility] nurse: [phone] or [phone]     R Recommendations: See Admitting Provider Note  Report given to:   Additional Notes: Pt has water restrctions

## 2019-04-25 NOTE — ED Triage Notes (Signed)
Pt is coming by [facility] from home where she lives with her husband - pt woke up this morning and noticed she was weak on the left side of her body- on ems arrival pt only had left arm weakness with clear speech able to ambulate. On arrival to ed pt has mild palmar drift in left arm. Pt is alert and ox4. No weakness in lower extremities.

## 2019-04-25 NOTE — ED Notes (Signed)
Dr Sheehan at bedside 

## 2019-04-25 NOTE — Progress Notes (Signed)
  Echocardiogram 2D Echocardiogram has been performed.  Jennette Dubin 04/25/2019, 4:24 PM

## 2019-04-25 NOTE — Evaluation (Signed)
Physical Therapy Evaluation Patient Details Name: Tammy Allison MRN: [id number] DOB: [birth year] Today's Date: 04/25/2019   History of Present Illness  Pt is an 84 y/o female admitted secondary to L sided weakness. MRI negative for acute abnormality. Per notes, pt with likely TIA. PMH includes HTN and macular degeneration.   Clinical Impression  Pt admitted secondary to problem above with deficits below. Pt with mild unsteadiness and 1 LOB during gait requiring min to min guard A for steadying. Feel pt will progress well and may not require follow up PT, however, will update recommendations based on pt progression. Will continue to follow acutely to maximize functional mobility independence and safety.     Follow Up Recommendations Supervision - Intermittent(HHPT vs no PT follow up )    Equipment Recommendations  None recommended by PT    Recommendations for Other Services       Precautions / Restrictions Precautions Precautions: Fall Restrictions Weight Bearing Restrictions: No      Mobility  Bed Mobility Overal bed mobility: Modified Independent                Transfers Overall transfer level: Needs assistance Equipment used: None Transfers: Sit to/from Stand Sit to Stand: Min guard         General transfer comment: Min guard for steadying assist.   Ambulation/Gait Ambulation/Gait assistance: Min guard;Min assist Gait Distance (Feet): 100 Feet Assistive device: None Gait Pattern/deviations: Step-through pattern;Decreased stride length Gait velocity: Decreased    General Gait Details: Slow, mildly unsteady gait. 1 LOB noted when pt was turning and required min A for steadying. Otherwise requiring min guard A. Pt reports some L ankle weakness.   Stairs            Wheelchair Mobility    Modified Rankin (Stroke Patients Only)       Balance Overall balance assessment: Needs assistance Sitting-balance support: No upper extremity supported;Feet  supported Sitting balance-Leahy Scale: Good     Standing balance support: No upper extremity supported;During functional activity Standing balance-Leahy Scale: Fair                               Pertinent Vitals/Pain Pain Assessment: No/denies pain    Home Living Family/patient expects to be discharged to:: Private residence Living Arrangements: Spouse/significant other Available Help at Discharge: Family Type of Home: House Home Access: Stairs to enter Entrance Stairs-Rails: Psychiatric nurse of Steps: 4 Home Layout: One level Home Equipment: Environmental consultant - 2 wheels;Cane - single point      Prior Function Level of Independence: Independent               Hand Dominance        Extremity/Trunk Assessment   Upper Extremity Assessment Upper Extremity Assessment: Defer to OT evaluation    Lower Extremity Assessment Lower Extremity Assessment: LLE deficits/detail LLE Deficits / Details: Pt reporting feels like she has weakness in L ankle during functional mobility.     Cervical / Trunk Assessment Cervical / Trunk Assessment: Normal  Communication   Communication: No difficulties  Cognition Arousal/Alertness: Awake/alert Behavior During Therapy: WFL for tasks assessed/performed Overall Cognitive Status: Within Functional Limits for tasks assessed                                        General Comments General comments (skin  integrity, edema, etc.): Educated about "BE FAST" acronym for recognizing stroke symptoms.     Exercises     Assessment/Plan    PT Assessment Patient needs continued PT services  PT Problem List Decreased strength;Decreased balance;Decreased mobility;Decreased knowledge of use of DME;Decreased knowledge of precautions       PT Treatment Interventions DME instruction;Gait training;Functional mobility training;Therapeutic exercise;Stair training;Therapeutic activities;Balance  training;Patient/family education    PT Goals (Current goals can be found in the Care Plan section)  Acute Rehab PT Goals Patient Stated Goal: to go home PT Goal Formulation: With patient Time For Goal Achievement: 05/09/19 Potential to Achieve Goals: Good    Frequency Min 4X/week   Barriers to discharge        Co-evaluation               AM-PAC PT "6 Clicks" Mobility  Outcome Measure Help needed turning from your back to your side while in a flat bed without using bedrails?: None Help needed moving from lying on your back to sitting on the side of a flat bed without using bedrails?: None Help needed moving to and from a bed to a chair (including a wheelchair)?: A Little Help needed standing up from a chair using your arms (e.g., wheelchair or bedside chair)?: A Little Help needed to walk in hospital room?: A Little Help needed climbing 3-5 steps with a railing? : A Little 6 Click Score: 20    End of Session Equipment Utilized During Treatment: Gait belt Activity Tolerance: Patient tolerated treatment well Patient left: in bed;with call bell/phone within reach;with bed alarm set;with nursing/sitter in room Nurse Communication: Mobility status;Other (comment)(pt had BM) PT Visit Diagnosis: Unsteadiness on feet (R26.81);Muscle weakness (generalized) (M62.81)    Time: 8937-3428 PT Time Calculation (min) (ACUTE ONLY): 16 min   Charges:   PT Evaluation $PT Eval Low Complexity: Shell, PT, DPT  Acute Rehabilitation Services  Pager: [phone] Office: [phone]   Rudean Hitt 04/25/2019, 6:24 PM

## 2019-04-25 NOTE — ED Notes (Signed)
Patient transported to CT 

## 2019-04-25 NOTE — H&P (Signed)
History and Physical    TARAOLUWA THAKUR QMV:[id number] DOB: 1935/02/09 DOA: 04/25/2019  PCP: [name], MD  Patient coming from: Home  I have personally briefly reviewed patient's old medical records in [place]  Chief Complaint: Left sided weakness noted at 730 this morning.  Last known normal was 6 AM  HPI: Tammy Allison is a 84 y.o. female with medical history significant of hypertension and hyperlipidemia who presents the emergency department with reports of left-sided weakness.  She awoke at 6 AM and was able to walk to the bathroom and had no weakness.  To sleep and awoke again at 730 was weak on her left side and unable to move out of the bed.  Her husband helped her moved to the ground.  She reports having improvement of her symptoms and EMS reported that her symptoms had almost completely resolved on their arrival she denies any history of seizures, stroke, previous weakness, or diabetes.  She does have a history of hypertension and has not taken her antihypertensive medications yet this morning.  She denies any history of cough, chills, or COVID exposure.  In the emergency department she was rapidly evaluated via code stroke and Dr. Leonel Ramsay from neurology saw the patient and his note is noted.  ED Course: CTA of the head and neck showed left P2 high-grade stenosis coming from the posterior communicating artery.  She also is hypokalemic (3.1), hypocalcemic (ionized calcium 1.01), mildly hyponatremic (133) and hyperglycemic (123)  Review of Systems: As per HPI otherwise all other systems reviewed and  negative.   Past Medical History:  Diagnosis Date   Arthritis    GERD (gastroesophageal reflux disease)    H/O hiatal hernia    Hyperlipidemia    Hypertension    Hypothyroidism    Irritable bowel syndrome    Macular degeneration    Exudative - OD    Past Surgical History:  Procedure Laterality Date   CATARACT EXTRACTION     EYE SURGERY Bilateral    cataract surgery w/ lens implant   FRACTURE SURGERY     right hip   LUMBAR LAMINECTOMY/DECOMPRESSION MICRODISCECTOMY Left 05/25/2014   Procedure: LUMBAR LAMINECTOMY/DECOMPRESSION MICRODISCECTOMY 1 LEVEL   lumbar three;  Surgeon: Hosie Spangle, MD;  Location: [facility];  Service: Neurosurgery;  Laterality: Left;   THYROID SURGERY      Social History   Social History Narrative   Not on file     reports that she has never smoked. She has never used smokeless tobacco. She reports that she does not drink alcohol or use drugs.  Allergies  Allergen Reactions   Sulfa Antibiotics Hives   Carvedilol Rash    Family History  Problem Relation Age of Onset   Macular degeneration Mother      Prior to Admission medications   Medication Sig Start Date End Date Taking? Authorizing Provider  cetirizine (ZYRTEC) 10 MG tablet Take 5 mg by mouth at bedtime.    Yes [provider]  cholecalciferol (VITAMIN D) 1000 UNITS tablet Take 1,000 Units by mouth every morning.    Yes [provider]  hydrALAZINE (APRESOLINE) 50 MG tablet Take 1 tablet (50 mg total) by mouth 3 (three) times daily. 04/14/19  Yes Belva Crome, MD  levothyroxine (SYNTHROID) 88 MCG tablet Take 88 mcg by mouth daily before breakfast.    Yes [provider]  losartan (COZAAR) 100 MG tablet Take 100 mg by mouth daily. 08/30/18  Yes [provider]  metoprolol tartrate (LOPRESSOR) 100 MG tablet Take 1 tablet (100 mg total) by mouth 2 (two) times daily. 01/12/19  Yes Belva Crome, MD  Multiple Vitamins-Minerals (PRESERVISION AREDS 2 PO) Take 1 capsule by mouth daily.   Yes [provider]  pantoprazole (PROTONIX) 40 MG tablet Take 40 mg by mouth every morning.    Yes [provider]  Probiotic Product (ALIGN) 4 MG CAPS Take 4 mg by mouth daily.   Yes [provider]    Physical Exam:  Constitutional: NAD, calm, comfortable Vitals:   04/25/19 1015 04/25/19  1030 04/25/19 1045 04/25/19 1145  BP: (!) 178/73 (!) 184/76 (!) 178/65 (!) 164/66  Pulse: 83 78 77 92  Resp: 20 15 20 18   SpO2: 97% 99% 97% 100%  Weight:      Height:       Eyes: PERRL, lids and conjunctivae normal ENMT: Mucous membranes are moist. Posterior pharynx clear of any exudate or lesions.Normal dentition.  Neck: normal, supple, no masses, no thyromegaly Respiratory: clear to auscultation bilaterally, no wheezing, no crackles. Normal respiratory effort. No accessory muscle use.  Cardiovascular: Regular rate and rhythm, no murmurs / rubs / gallops. No extremity edema. 2+ pedal pulses. No carotid bruits.  Abdomen: no tenderness, no masses palpated. No hepatosplenomegaly. Bowel sounds positive.  Musculoskeletal: no clubbing / cyanosis. No joint deformity upper and lower extremities. Good ROM, no contractures. Normal muscle tone.  Skin: no rashes, lesions, ulcers. No induration Neurologic: CN 2-12 grossly intact. Sensation intact, DTR normal. Strength 5/5 in all 4 very mild pronator drift on the left side otherwise completely unremarkable.  Psychiatric: Normal judgment and insight. Alert and oriented x 3. Normal mood.    Labs on Admission: I have personally reviewed following labs and imaging studies  CBC: Recent Labs  Lab 04/25/19 0842 04/25/19 0851  WBC 6.3  --   NEUTROABS 4.1  --   HGB 12.1 12.6  HCT 37.8 37.0  MCV 90.9  --   PLT 214  --    Basic Metabolic Panel: Recent Labs  Lab 04/25/19 0842 04/25/19 0851  NA 133* 133*  K 3.2* 3.1*  CL 99 99  CO2 22  --   GLUCOSE 126* 123*  BUN 11 11  CREATININE 0.89 0.80  CALCIUM 8.7*  --    GFR: Estimated Creatinine Clearance: 46.8 mL/min (by C-G formula based on SCr of 0.8 mg/dL). Liver Function Tests: Recent Labs  Lab 04/25/19 0842  AST 18  ALT 14  ALKPHOS 44  BILITOT 0.8  PROT 6.2*  ALBUMIN 3.6   Coagulation Profile: Recent Labs  Lab 04/25/19 0842  INR 1.0   CBG: Recent Labs  Lab 04/25/19 1015    GLUCAP 111*   Urine analysis:    Component Value Date/Time   COLORURINE YELLOW 05/29/2014 1122   APPEARANCEUR CLEAR 05/29/2014 1122   LABSPEC 1.002 (L) 05/29/2014 1122   PHURINE 7.5 05/29/2014 1122   GLUCOSEU NEGATIVE 05/29/2014 1122   HGBUR TRACE (A) 05/29/2014 1122   BILIRUBINUR NEGATIVE 05/29/2014 1122   KETONESUR NEGATIVE 05/29/2014 1122   PROTEINUR NEGATIVE 05/29/2014 1122   UROBILINOGEN 0.2 05/29/2014 1122   NITRITE NEGATIVE 05/29/2014 1122   LEUKOCYTESUR NEGATIVE 05/29/2014 1122    Radiological Exams on Admission: Ct Angio Head W Or Wo Contrast  Result Date: 04/25/2019 CLINICAL DATA:  Acute onset of left-sided weakness beginning this morning. EXAM: CT ANGIOGRAPHY HEAD AND NECK TECHNIQUE: Multidetector CT imaging of the head and neck was performed using  the standard protocol during bolus administration of intravenous contrast. Multiplanar CT image reconstructions and MIPs were obtained to evaluate the vascular anatomy. Carotid stenosis measurements (when applicable) are obtained utilizing NASCET criteria, using the distal internal carotid diameter as the denominator. CONTRAST:  70mL OMNIPAQUE IOHEXOL 350 MG/ML SOLN COMPARISON:  None. FINDINGS: CT HEAD FINDINGS Brain: Lacunar infarcts in the basal ganglia bilaterally appear remote. There is also a remote lacunar infarct in the right centrum semi ovale moderate atrophy and white matter disease is present bilaterally. The ventricles are proportionate to the degree of atrophy. The brainstem and cerebellum are within normal limits. No acute hemorrhage or mass lesion is present. No significant extraaxial fluid collection is present. Vascular: Dense atherosclerotic calcifications are present in the cavernous internal carotid arteries bilaterally. There is no hyperdense vessel. Skull: Calvarium is intact. No focal lytic or blastic lesions are present. Sinuses: There is some fluid in chronic sclerosis in the right mastoid air cells. No  obstructing nasopharyngeal lesion is present. The paranasal sinuses and mastoid air cells are otherwise clear. Orbits: Bilateral lens replacements are noted. Globes and orbits are otherwise unremarkable. Review of the MIP images confirms the above findings CTA NECK FINDINGS Aortic arch: Atherosclerotic changes are noted at the aortic arch. There is no aneurysm. There is no significant stenosis of the great vessel origins. A 3 vessel arch configuration is present. Right carotid system: The right common carotid artery is within normal limits. Bifurcation demonstrates mild atherosclerotic changes without significant stenosis. There is beaded irregularity of the mid cervical right ICA without focal stenosis or dissection. Left carotid system: The left common carotid artery is within normal limits. Minimal atherosclerotic changes are noted at the bifurcation without a significant stenosis. There is beaded irregularity of the mid cervical left ICA without focal stenosis or dissection. Vertebral arteries: Vertebral arteries are codominant. Both vertebral arteries originate from the subclavian arteries without a significant stenosis at the origin. There is no focal stenosis or injury to either vertebral artery in the neck. No focal vascular irregularity is present. Skeleton: Multilevel endplate degenerative changes are noted in the cervical spine. Uncovertebral spurring contributes to foraminal narrowing bilaterally at C5-6 and C6-7. No focal lytic or blastic lesions are present. Other neck: No focal mucosal or submucosal lesions are present. Salivary glands are within normal limits. No significant adenopathy is present. The thyroid is either markedly atrophic or has been partially resected. There is some residual thyroid tissue on the left. Minimal tissue is present on the right. Upper chest: Calcific scarring is present at the left lung apex. Lungs are otherwise clear. No focal nodule, mass, or airspace disease is  present. Thoracic inlet is within normal limits. Review of the MIP images confirms the above findings CTA HEAD FINDINGS Anterior circulation: Atherosclerotic calcifications are present in the cavernous internal carotid arteries. There is no significant stenosis from the skull base through the ICA termini. The A1 and M1 segments are normal. The anterior communicating artery is patent. The MCA bifurcations are intact. There is some attenuation of distal MCA and ACA branch vessels without a significant proximal stenosis, aneurysm, or branch vessel occlusion. Posterior circulation: The left vertebral artery is slightly dominant. PICA origins are visualized and normal. The basilar artery is small. P1 segments contribute with prominent posterior communicating arteries bilaterally to the posterior cerebral arteries. There is a high-grade stenosis of the proximal left P2 segment. PCA branch vessels are intact bilaterally. Venous sinuses: The dural sinuses are patent. Straight sinus and deep cerebral veins  are intact. Cortical veins are unremarkable. Anatomic variants: Prominent posterior communicating arteries bilaterally. Review of the MIP images confirms the above findings IMPRESSION: 1. No acute intracranial abnormality. 2. Lacunar infarcts of the basal ganglia appear remote. 3. High-grade stenosis of the left P2 segment. 4. No emergent large vessel occlusion or other significant stenosis in the proximal circle-of-Willis. 5. Mild distal small vessel disease throughout the circle-of-Willis. 6. Beaded irregularity of the cervical internal carotid arteries bilaterally consistent with fibromuscular dysplasia. There is no significant stenosis or dissection. Electronically Signed   By: San Morelle M.D.   On: 04/25/2019 12:02   Ct Angio Neck W Or Wo Contrast  Result Date: 04/25/2019 CLINICAL DATA:  Acute onset of left-sided weakness beginning this morning. EXAM: CT ANGIOGRAPHY HEAD AND NECK TECHNIQUE: Multidetector  CT imaging of the head and neck was performed using the standard protocol during bolus administration of intravenous contrast. Multiplanar CT image reconstructions and MIPs were obtained to evaluate the vascular anatomy. Carotid stenosis measurements (when applicable) are obtained utilizing NASCET criteria, using the distal internal carotid diameter as the denominator. CONTRAST:  31mL OMNIPAQUE IOHEXOL 350 MG/ML SOLN COMPARISON:  None. FINDINGS: CT HEAD FINDINGS Brain: Lacunar infarcts in the basal ganglia bilaterally appear remote. There is also a remote lacunar infarct in the right centrum semi ovale moderate atrophy and white matter disease is present bilaterally. The ventricles are proportionate to the degree of atrophy. The brainstem and cerebellum are within normal limits. No acute hemorrhage or mass lesion is present. No significant extraaxial fluid collection is present. Vascular: Dense atherosclerotic calcifications are present in the cavernous internal carotid arteries bilaterally. There is no hyperdense vessel. Skull: Calvarium is intact. No focal lytic or blastic lesions are present. Sinuses: There is some fluid in chronic sclerosis in the right mastoid air cells. No obstructing nasopharyngeal lesion is present. The paranasal sinuses and mastoid air cells are otherwise clear. Orbits: Bilateral lens replacements are noted. Globes and orbits are otherwise unremarkable. Review of the MIP images confirms the above findings CTA NECK FINDINGS Aortic arch: Atherosclerotic changes are noted at the aortic arch. There is no aneurysm. There is no significant stenosis of the great vessel origins. A 3 vessel arch configuration is present. Right carotid system: The right common carotid artery is within normal limits. Bifurcation demonstrates mild atherosclerotic changes without significant stenosis. There is beaded irregularity of the mid cervical right ICA without focal stenosis or dissection. Left carotid system:  The left common carotid artery is within normal limits. Minimal atherosclerotic changes are noted at the bifurcation without a significant stenosis. There is beaded irregularity of the mid cervical left ICA without focal stenosis or dissection. Vertebral arteries: Vertebral arteries are codominant. Both vertebral arteries originate from the subclavian arteries without a significant stenosis at the origin. There is no focal stenosis or injury to either vertebral artery in the neck. No focal vascular irregularity is present. Skeleton: Multilevel endplate degenerative changes are noted in the cervical spine. Uncovertebral spurring contributes to foraminal narrowing bilaterally at C5-6 and C6-7. No focal lytic or blastic lesions are present. Other neck: No focal mucosal or submucosal lesions are present. Salivary glands are within normal limits. No significant adenopathy is present. The thyroid is either markedly atrophic or has been partially resected. There is some residual thyroid tissue on the left. Minimal tissue is present on the right. Upper chest: Calcific scarring is present at the left lung apex. Lungs are otherwise clear. No focal nodule, mass, or airspace disease is present. Thoracic  inlet is within normal limits. Review of the MIP images confirms the above findings CTA HEAD FINDINGS Anterior circulation: Atherosclerotic calcifications are present in the cavernous internal carotid arteries. There is no significant stenosis from the skull base through the ICA termini. The A1 and M1 segments are normal. The anterior communicating artery is patent. The MCA bifurcations are intact. There is some attenuation of distal MCA and ACA branch vessels without a significant proximal stenosis, aneurysm, or branch vessel occlusion. Posterior circulation: The left vertebral artery is slightly dominant. PICA origins are visualized and normal. The basilar artery is small. P1 segments contribute with prominent posterior  communicating arteries bilaterally to the posterior cerebral arteries. There is a high-grade stenosis of the proximal left P2 segment. PCA branch vessels are intact bilaterally. Venous sinuses: The dural sinuses are patent. Straight sinus and deep cerebral veins are intact. Cortical veins are unremarkable. Anatomic variants: Prominent posterior communicating arteries bilaterally. Review of the MIP images confirms the above findings IMPRESSION: 1. No acute intracranial abnormality. 2. Lacunar infarcts of the basal ganglia appear remote. 3. High-grade stenosis of the left P2 segment. 4. No emergent large vessel occlusion or other significant stenosis in the proximal circle-of-Willis. 5. Mild distal small vessel disease throughout the circle-of-Willis. 6. Beaded irregularity of the cervical internal carotid arteries bilaterally consistent with fibromuscular dysplasia. There is no significant stenosis or dissection. Electronically Signed   By: San Morelle M.D.   On: 04/25/2019 12:02    EKG: Independently reviewed.  Sinus rhythm at 90 bpm  Assessment/Plan Principal Problem:   Cerebral thrombosis with transient ischemic attack (TIA) Active Problems:   Benign essential hypertension   Hypokalemia   Hyponatremia   Hyperlipidemia LDL goal <100   Hypocalcemia   Hypothyroid    1.  Cerebral thrombosis with transient ischemic attack: Has been seen by neurology who recommends that she be admitted for risk factor modification and evaluation.  Hemoglobin A1c fasting lipid panel have been ordered.  MRI is ordered.  She will be admitted with frequent neuro checks.  Echocardiogram is pending.  CTA of the head and neck is done and as noted above.  She has already received aspirin and Plavix after the 300 mg load.  She will continue dual antiplatelet therapy for 3 weeks followed by monotherapy.  He is admitted to telemetry monitoring. PT, OT, ST consults have been ordered.  Patient has passed a stroke swallow  screen done by nursing in the emergency department.  She will be admitted under observation.  Should she have an MRI that indicates an acute stroke we will convert her to inpatient.  2.  Benign essential hypertension: Hold blood pressure medicines and allow for permissive hypertension I will order some IV hydralazine for very high blood pressures please see orders.  3.  Hypokalemia: Patient has received potassium in the emergency department will recheck in a.m.  4.  Hyponatremia: This is a chronic problem for this patient.  She was put on a fluid restriction but not a free water restriction.  I will institute a no free water no ice order as well as limit fluids to 1800 mL in 24 hours.  We will recheck sodium levels in a.m.  5.  Hypocalcemia patient does take vitamin D but there is no order for calcium will order calcium supplements along with her vitamin D.  6.  Hypothyroidism: Continue Synthroid at home doses.  7.  Hyperlipidemia: Patient carries a diagnosis of hyperlipidemia but is not on medications.  Will check fasting  lipid panel in a.m.   DVT prophylaxis: Lovenox Code Status: Full code Family Communication: Husband, Avalie Oconnor by phone at [phone] updated on patient's condition and plan for hospitalization all questions answered. Disposition Plan: Likely home in 48 hours Consults called: Neurology, Dr. Leonel Ramsay and stroke team Admission status: Patient  It is my clinical opinion that referral for OBSERVATION is reasonable and necessary in this patient based on the above information provided. The aforementioned taken together are felt to place the patient at high risk for further clinical deterioration. However it is anticipated that the patient may be medically stable for discharge from the hospital within 24 to 48 hours.   Lady Deutscher MD FACP [facility] Pager [phone]  How to contact the [facility] Attending or Consulting provider [place] or covering  provider during after hours [place], for this patient?  1. Check the care team in [facility] and look for a) attending/consulting [facility] provider listed and b) the [facility] team listed 2. Log into [email or web address] and use [place]'s universal password to access. If you do not have the password, please contact the hospital operator. 3. Locate the [facility] provider you are looking for under [facility] and page to a number that you can be directly reached. 4. If you still have difficulty reaching the provider, please page the [facility] (Director on Call) for the Hospitalists listed on amion for assistance.  If 7PM-7AM, please contact night-coverage [email or web address] Password [facility]  04/25/2019, 12:58 PM

## 2019-04-25 NOTE — Consult Note (Signed)
Neurology Consultation Reason for Consult: Left-sided weakness Referring Physician: Jeanell Sparrow, D  CC: Left-sided weakness  History is obtained from: Patient  HPI: Tammy Allison is a 84 y.o. female with a history of hypertension, hyperlipidemia who presents with left-sided weakness that started abruptly this morning.  She got up to go the bathroom at 5 AM, and then came back to bed and lay down.  The next time she awoke, she tried to lift her left arm and found that it would not work.  It was also numb.  She also describes some nausea.  The symptoms then improved, and by the time of EMS arrival she only had mild arm weakness.  She continues to improve at this time.   LKW: 5 AM tpa given?: no, mild symptoms NIH stroke scale: 2, 1 for arm drift and 1 for leg drift  ROS: A 14 point ROS was performed and is negative except as noted in the HPI.  Past Medical History:  Diagnosis Date  . Arthritis   . GERD (gastroesophageal reflux disease)   . H/O hiatal hernia   . Hyperlipidemia   . Hypertension   . Hypothyroidism   . Irritable bowel syndrome   . Macular degeneration    Exudative - OD     Family History  Problem Relation Age of Onset  . Macular degeneration Mother      Social History:  reports that she has never smoked. She has never used smokeless tobacco. She reports that she does not drink alcohol or use drugs.   Exam: Current vital signs: BP (!) 177/69   Pulse 79   Resp 14   Ht 5\' 4"  (1.626 m)   Wt 62.6 kg   SpO2 97%   BMI 23.69 kg/m  Vital signs in last 24 hours: Pulse Rate:  [79-89] 79 (06/08 0900) Resp:  [14-19] 14 (06/08 0900) BP: (177-199)/(69-96) 177/69 (06/08 0900) SpO2:  [97 %-98 %] 97 % (06/08 0900) Weight:  [62.6 kg] 62.6 kg (06/08 0851)   Physical Exam  Constitutional: Appears well-developed and well-nourished.  Psych: Affect appropriate to situation Eyes: No scleral injection HENT: No OP obstrucion Head: Normocephalic.  Cardiovascular: Normal rate  and regular rhythm.  Respiratory: Effort normal, non-labored breathing GI: Soft.  No distension. There is no tenderness.  Skin: WDI  Neuro: Mental Status: Patient is awake, alert, oriented to person, place, month, year, and situation. Patient is able to give a clear and coherent history. No signs of aphasia or neglect Cranial Nerves: II: Visual Fields are full. Pupils are equal, round, and reactive to light.   III,IV, VI: EOMI without ptosis or diploplia.  V: Facial sensation is symmetric to temperature VII: Facial movement is symmetric.  VIII: hearing is intact to voice X: Uvula elevates symmetrically XI: Shoulder shrug is symmetric. XII: tongue is midline without atrophy or fasciculations.  Motor: Tone is normal. Bulk is normal. 5/5 strength was present on the right, on the left she has 4+/5 strength of the left arm and leg Sensory: Sensation is symmetric to light touch and temperature in the arms and legs. Cerebellar: FNF and HKS are intact bilaterally   I have reviewed labs in epic and the results pertinent to this consultation are: CMP-mild hyponatremia, mild hypokalemia   Impression: 85 year old female with improving symptoms that are most consistent with cerebral ischemia.  She still has mild symptoms, therefore the clock does not restart and her last known well is 5 AM, meaning she is out of the window  at this time if she were to worsen.  She need admission for secondary risk factor modification.  Recommendations: - HgbA1c, fasting lipid panel - MRI  of the brain without contrast - Frequent neuro checks - Echocardiogram -CT angiogram of the head and neck - Prophylactic therapy-Antiplatelet med: Aspirin -81 mg and Plavix 75 mg following 300 mg load, continue dual therapy for 3 weeks followed by monotherapy - Risk factor modification - Telemetry monitoring - PT consult, OT consult, Speech consult - Stroke team to follow    Roland Rack, MD [facility] [phone]  If 7pm- 7am, please page neurology on call as listed in [place].

## 2019-04-25 NOTE — ED Notes (Signed)
Pt to MRI . Per Dr. Evangeline Gula. Pt may tavel without monitor.

## 2019-04-25 NOTE — ED Notes (Signed)
Pt request potassium in pill form.  Dr. Jeanell Sparrow aware.

## 2019-04-25 NOTE — ED Notes (Addendum)
Pt husband Charles [phone]

## 2019-04-25 NOTE — ED Notes (Signed)
Pt speaking with family n phone

## 2019-04-26 DIAGNOSIS — G459 Transient cerebral ischemic attack, unspecified: Secondary | ICD-10-CM | POA: Diagnosis not present

## 2019-04-26 DIAGNOSIS — I669 Occlusion and stenosis of unspecified cerebral artery: Secondary | ICD-10-CM | POA: Diagnosis not present

## 2019-04-26 DIAGNOSIS — I10 Essential (primary) hypertension: Secondary | ICD-10-CM | POA: Diagnosis not present

## 2019-04-26 LAB — HEMOGLOBIN A1C
Hgb A1c MFr Bld: 5.3 % (ref 4.8–5.6)
Mean Plasma Glucose: 105.41 mg/dL

## 2019-04-26 LAB — LIPID PANEL
Cholesterol: 220 mg/dL — ABNORMAL HIGH (ref 0–200)
HDL: 62 mg/dL (ref >40)
LDL Cholesterol: 143 mg/dL — ABNORMAL HIGH (ref 0–99)
Total CHOL/HDL Ratio: 3.5 RATIO
Triglycerides: 77 mg/dL (ref <150)
VLDL: 15 mg/dL (ref 0–40)

## 2019-04-26 LAB — BASIC METABOLIC PANEL
Anion gap: 11 (ref 5–15)
BUN: 15 mg/dL (ref 8–23)
CO2: 24 mmol/L (ref 22–32)
Calcium: 8.6 mg/dL — ABNORMAL LOW (ref 8.9–10.3)
Chloride: 99 mmol/L (ref 98–111)
Creatinine, Ser: 1.1 mg/dL — ABNORMAL HIGH (ref 0.44–1.00)
GFR calc Af Amer: 54 mL/min — ABNORMAL LOW (ref >60)
GFR calc non Af Amer: 47 mL/min — ABNORMAL LOW (ref >60)
Glucose, Bld: 110 mg/dL — ABNORMAL HIGH (ref 70–99)
Potassium: 4 mmol/L (ref 3.5–5.1)
Sodium: 134 mmol/L — ABNORMAL LOW (ref 135–145)

## 2019-04-26 LAB — MAGNESIUM: Magnesium: 2 mg/dL (ref 1.7–2.4)

## 2019-04-26 MED ORDER — ASPIRIN 81 MG PO TBEC: 81.0000 mg | DELAYED_RELEASE_TABLET | Freq: Every day | ORAL | Status: DC

## 2019-04-26 MED ORDER — CLOPIDOGREL BISULFATE 75 MG PO TABS: 75.0000 mg | ORAL_TABLET | Freq: Every day | ORAL | 0 refills | Status: DC

## 2019-04-26 MED ORDER — LOSARTAN POTASSIUM 100 MG PO TABS: 100.0000 mg | ORAL_TABLET | Freq: Every day | ORAL | Status: DC

## 2019-04-26 MED ORDER — ROSUVASTATIN CALCIUM 5 MG PO TABS: 5.0000 mg | ORAL_TABLET | Freq: Every day | ORAL | 0 refills | Status: DC

## 2019-04-26 MED ORDER — HYDRALAZINE HCL 50 MG PO TABS: 50.0000 mg | ORAL_TABLET | Freq: Three times a day (TID) | ORAL | 3 refills | Status: DC

## 2019-04-26 MED ORDER — METOPROLOL TARTRATE 50 MG PO TABS
100.0000 mg | ORAL_TABLET | Freq: Two times a day (BID) | ORAL | Status: DC
  Administered 2019-04-26: 100 mg via ORAL
  Filled 2019-04-26: qty 2

## 2019-04-26 MED ORDER — ROSUVASTATIN CALCIUM 5 MG PO TABS: 10.0000 mg | ORAL_TABLET | Freq: Every day | ORAL | Status: DC

## 2019-04-26 MED ORDER — ATORVASTATIN CALCIUM 10 MG PO TABS: 20.0000 mg | ORAL_TABLET | Freq: Every day | ORAL | Status: DC

## 2019-04-26 NOTE — Progress Notes (Signed)
Patient being discharged home with home health. Education and instructions given to patient. CCMD notified. IV removed. Patient leaving unit via wheelchair. All belongings with patient.

## 2019-04-26 NOTE — Evaluation (Signed)
Speech Language Pathology Evaluation Patient Details Name: Tammy Allison MRN: [id number] DOB: [birth year] Today's Date: 04/26/2019 Time: 3976-7341 SLP Time Calculation (min) (ACUTE ONLY): 17 min  Problem List:  Patient Active Problem List   Diagnosis Date Noted  . Hypocalcemia 04/25/2019  . Cerebral thrombosis with transient ischemic attack (TIA) 04/25/2019  . Snoring 03/17/2019  . Nausea alone 05/31/2014  . Malnutrition of moderate degree ([place]) 05/30/2014  . Weakness 05/29/2014  . Tachycardia 05/29/2014  . HNP (herniated nucleus pulposus), lumbar 05/25/2014  . Electrolyte abnormality 05/12/2014  . Hypokalemia 05/12/2014  . Hyponatremia 05/12/2014  . Lumbosacral radiculopathy 05/12/2014  . Hematuria 05/12/2014  . Benign essential hypertension 05/12/2014  . Hyperlipidemia LDL goal <100 05/12/2014  . Hypothyroid 05/12/2014   Past Medical History:  Past Medical History:  Diagnosis Date  . Arthritis   . GERD (gastroesophageal reflux disease)   . H/O hiatal hernia   . Hyperlipidemia   . Hypertension   . Hypothyroidism   . Irritable bowel syndrome   . Macular degeneration    Exudative - OD   Past Surgical History:  Past Surgical History:  Procedure Laterality Date  . CATARACT EXTRACTION    . EYE SURGERY Bilateral    cataract surgery w/ lens implant  . FRACTURE SURGERY     right hip  . LUMBAR LAMINECTOMY/DECOMPRESSION MICRODISCECTOMY Left 05/25/2014   Procedure: LUMBAR LAMINECTOMY/DECOMPRESSION MICRODISCECTOMY 1 LEVEL   lumbar three;  Surgeon: Hosie Spangle, MD;  Location: [place] [facility];  Service: Neurosurgery;  Laterality: Left;  . THYROID SURGERY     HPI:  Pt is an 84 y.o. female with medical history significant of hypertension and hyperlipidemia who presented to the emergency department with reports of left-sided weakness.  MRI of the brain was negative for acute changes.    Assessment / Plan / Recommendation Clinical Impression  Pt reported that she currently  resides with her husband and was living independently prior to admission without any deficits in speech, language or cognition. Her speech and language skills are currently within normal limits and no overt cognitive-linguistic deficits were noted. Further skilled SLP services are not clinically indicated at this time. Pt, and nursing were educated regarding results and recommendations; both parties verbalized understanding as well as agreement with plan of care.    SLP Assessment  SLP Recommendation/Assessment: Patient does not need any further Speech Lanaguage Pathology Services SLP Visit Diagnosis: Aphasia (R47.01)    Follow Up Recommendations  None    Frequency and Duration           SLP Evaluation Cognition  Overall Cognitive Status: Within Functional Limits for tasks assessed Arousal/Alertness: Awake/alert Orientation Level: Oriented X4 Attention: Focused;Sustained Focused Attention: Appears intact Sustained Attention: Appears intact Memory: Appears intact(Immediate: 3/3; Delayed: 2/3; with cue: 1/1) Awareness: Appears intact Problem Solving: Appears intact Executive Function: Reasoning;Sequencing Reasoning: Appears intact(3/3)       Comprehension  Auditory Comprehension Overall Auditory Comprehension: Appears within functional limits for tasks assessed Yes/No Questions: Within Functional Limits Basic Biographical Questions: (5/5) Complex Questions: (5/5) Paragraph Comprehension (via yes/no questions): (3/4) Commands: Within Functional Limits One Step Basic Commands: (5/5) Two Step Basic Commands: (4/4) Multistep Basic Commands: (3/4) Conversation: Complex Visual Recognition/Discrimination Discrimination: Within Function Limits Reading Comprehension Reading Status: Within funtional limits    Expression Expression Primary Mode of Expression: Verbal Verbal Expression Overall Verbal Expression: Appears within functional limits for tasks assessed Initiation: No  impairment Automatic Speech: Counting;Day of week;Month of year(WNL) Level of Generative/Spontaneous Verbalization: Conversation Repetition: No  impairment(5/5) Naming: No impairment Responsive: (5/5) Confrontation: (10/10) Convergent: (Sentence completion: 5/5) Pragmatics: No impairment   Oral / Motor  Oral Motor/Sensory Function Overall Oral Motor/Sensory Function: Within functional limits Motor Speech Overall Motor Speech: Appears within functional limits for tasks assessed Respiration: Within functional limits Phonation: Normal Resonance: Within functional limits Articulation: Within functional limitis Intelligibility: Intelligible Motor Planning: Witnin functional limits Motor Speech Errors: Not applicable   Melis Trochez I. Hardin Negus, [place], [place] Office number [phone] Pager [place] 04/26/2019, 9:40 AM

## 2019-04-26 NOTE — TOC Transition Note (Signed)
Transition of Care [facility]) - CM/SW Discharge Note   Patient Details  Name: Tammy Allison MRN: [id number] Date of Birth: 1934-12-07  Transition of Care [facility]) CM/SW Contact:  Zenon Mayo, RN Phone Number: 04/26/2019, 2:18 PM   Clinical Narrative:    For dc, NCM offered choice, she chose [facility], referral given to [facility] for HHPT/HHOT, soc will begin 24-48 hrs post dc.    Final next level of care: [place] Barriers to Discharge: No Barriers Identified   Patient Goals and CMS Choice Patient states their goals for this hospitalization and ongoing recovery are:: to get better CMS Medicare.gov Compare Post Acute Care list provided to:: Patient Choice offered to / list presented to : Patient  Discharge Placement                       Discharge Plan and Services In-house Referral: NA Discharge Planning Services: CM Consult Post Acute Care Choice: Home Health          DME Arranged: N/A DME Agency: NA       HH Arranged: PT, OT HH Agency: [place] Date [place]: 04/26/19 Time [place]: 0160 Representative spoke with at [place]: [place] ([place]) Interventions     Readmission Risk Interventions No flowsheet data found.

## 2019-04-26 NOTE — Progress Notes (Addendum)
Physical Therapy Treatment Patient Details Name: Tammy Allison MRN: [id number] DOB: [birth year] Today's Date: 04/26/2019    History of Present Illness Pt is an 84 y/o female admitted secondary to L sided weakness. MRI negative for acute abnormality. Per notes, pt with likely TIA. PMH includes HTN and macular degeneration.     PT Comments    Patient reported left ankle pain today. She had increased pain with gait and steps. Her pain is in her lateral ankle. No significant pain around the ATFL.  Patient was able to complete stairs safely despite ankle pain. She tolerated gait well. She would benefit from continued skilled therapy.   Follow Up Recommendations  Supervision - Intermittent     Equipment Recommendations  None recommended by PT    Recommendations for Other Services       Precautions / Restrictions Precautions Precautions: Fall Restrictions Weight Bearing Restrictions: No    Mobility  Bed Mobility Overal bed mobility: Modified Independent                Transfers Overall transfer level: Needs assistance Equipment used: None Transfers: Sit to/from Stand Sit to Stand: Supervision         General transfer comment: supervision for intial standing balance. Sit to stand 3x with therapy.   Ambulation/Gait Ambulation/Gait assistance: Supervision Gait Distance (Feet): 50 Feet(25'x2) Assistive device: None Gait Pattern/deviations: Step-through pattern;Decreased stride length Gait velocity: Decreased    General Gait Details: Patient reported left ankle pain with gait. She reports this ankle pain is new    Stairs Stairs: Yes Stairs assistance: Min guard Stair Management: One rail Right Number of Stairs: 4 General stair comments: increased pain in left ankle but good stability.    Wheelchair Mobility    Modified Rankin (Stroke Patients Only)       Balance Overall balance assessment: Needs assistance Sitting-balance support: No upper extremity  supported;Feet supported Sitting balance-Leahy Scale: Good     Standing balance support: No upper extremity supported;During functional activity Standing balance-Leahy Scale: Fair                              Cognition Arousal/Alertness: Awake/alert Behavior During Therapy: WFL for tasks assessed/performed Overall Cognitive Status: Within Functional Limits for tasks assessed                                        Exercises      General Comments General comments (skin integrity, edema, etc.): reviewed ankel stretching and light ankle mobility to work on when she is in th chair. Pain is in the lateral ankle but no significant tenderess to palpation in the lateral leg       Pertinent Vitals/Pain Pain Assessment: No/denies pain    Home Living     Available Help at Discharge: Family Type of Home: House              Prior Function            PT Goals (current goals can now be found in the care plan section) Acute Rehab PT Goals Patient Stated Goal: to go home PT Goal Formulation: With patient Time For Goal Achievement: 05/09/19 Potential to Achieve Goals: Good    Frequency    Min 4X/week      PT Plan Current plan remains appropriate    Co-evaluation  AM-PAC PT "6 Clicks" Mobility   Outcome Measure  Help needed turning from your back to your side while in a flat bed without using bedrails?: None Help needed moving from lying on your back to sitting on the side of a flat bed without using bedrails?: None Help needed moving to and from a bed to a chair (including a wheelchair)?: A Little Help needed standing up from a chair using your arms (e.g., wheelchair or bedside chair)?: A Little Help needed to walk in hospital room?: A Little Help needed climbing 3-5 steps with a railing? : A Little 6 Click Score: 20    End of Session Equipment Utilized During Treatment: Gait belt Activity Tolerance: Patient tolerated  treatment well Patient left: in bed;with call bell/phone within reach;with bed alarm set;with nursing/sitter in room Nurse Communication: Mobility status;Other (comment)(pt had BM) PT Visit Diagnosis: Unsteadiness on feet (R26.81);Muscle weakness (generalized) (M62.81)     Time: 3202-3343 PT Time Calculation (min) (ACUTE ONLY): 16 min  Charges:  $Gait Training: 8-22 mins                        Carney Living PT DPT  04/26/2019, 12:24 PM

## 2019-04-26 NOTE — Evaluation (Signed)
Occupational Therapy Evaluation Patient Details Name: Tammy Allison MRN: [id number] DOB: 30-Jan-1935 Today's Date: 04/26/2019    History of Present Illness Pt is an 84 y/o female admitted secondary to L sided weakness. MRI negative for acute abnormality. Per notes, pt with likely TIA. PMH includes HTN and macular degeneration.    Clinical Impression   Pt presents to OT following TIA. Pt has mild balance deficits that impact her ability to complete ADLs with prior level of functioning. Pt has slight residual weakness in her L side but is able to functionally use this hand/arm as a stabilizer. Pt would benefit from acute OT services and HHOT follow up to reduce fall risk and promote maximal return to PLOF.    Follow Up Recommendations  Home health OT    Equipment Recommendations  None recommended by OT       Precautions / Restrictions Precautions Precautions: Fall Restrictions Weight Bearing Restrictions: No      Mobility Bed Mobility Overal bed mobility: Modified Independent                Transfers Overall transfer level: Needs assistance Equipment used: None Transfers: Sit to/from Stand Sit to Stand: Supervision         General transfer comment: (S) during toilet transfer    Balance Overall balance assessment: Needs assistance Sitting-balance support: No upper extremity supported;Feet supported Sitting balance-Leahy Scale: Good     Standing balance support: No upper extremity supported;During functional activity Standing balance-Leahy Scale: Fair                             ADL either performed or assessed with clinical judgement   ADL Overall ADL's : Needs assistance/impaired Eating/Feeding: Independent   Grooming: Wash/dry hands;Standing;Modified independent   Upper Body Bathing: Modified independent   Lower Body Bathing: Supervison/ safety;Sit to/from stand   Upper Body Dressing : Modified independent;Sitting   Lower Body  Dressing: Supervision/safety;Sit to/from stand   Toilet Transfer: Supervision/safety   Toileting- Water quality scientist and Hygiene: Supervision/safety;Sit to/from stand       Functional mobility during ADLs: Supervision/safety General ADL Comments: Cueing for safety awareness     Vision Baseline Vision/History: No visual deficits Patient Visual Report: No change from baseline Vision Assessment?: No apparent visual deficits     Perception     Praxis      Pertinent Vitals/Pain Pain Assessment: No/denies pain     Hand Dominance Right   Extremity/Trunk Assessment Upper Extremity Assessment Upper Extremity Assessment: LUE deficits/detail LUE Deficits / Details: 4/5 weakness LUE Sensation: WNL LUE Coordination: WNL   Lower Extremity Assessment Lower Extremity Assessment: Defer to PT evaluation LLE Deficits / Details: Pt reporting feels like she has weakness in L ankle during functional mobility.    Cervical / Trunk Assessment Cervical / Trunk Assessment: Normal   Communication Communication Communication: No difficulties   Cognition Arousal/Alertness: Awake/alert Behavior During Therapy: WFL for tasks assessed/performed Overall Cognitive Status: Within Functional Limits for tasks assessed                                     General Comments  reviewed ankel stretching and light ankle mobility to work on when she is in th chair. Pain is in the lateral ankle but no significant tenderess to palpation in the lateral leg  Home Living Family/patient expects to be discharged to:: Private residence Living Arrangements: Spouse/significant other Available Help at Discharge: Family Type of Home: House Home Access: Stairs to enter Technical brewer of Steps: 4 Entrance Stairs-Rails: [place]: One level     Bathroom Shower/Tub: Occupational psychologist: Handicapped height     Home Equipment: Environmental consultant - 2  wheels;Kasandra Knudsen - single point      Lives With: Spouse    Prior Functioning/Environment Level of Independence: Independent                 OT Problem List: Decreased activity tolerance;Impaired balance (sitting and/or standing);Decreased coordination;Decreased safety awareness;Decreased knowledge of use of DME or AE;Decreased knowledge of precautions      OT Treatment/Interventions: Self-care/ADL training;Balance training;Patient/family education;Therapeutic activities;Therapeutic exercise;DME and/or AE instruction;Energy conservation    OT Goals(Current goals can be found in the care plan section) Acute Rehab OT Goals Patient Stated Goal: to go home OT Goal Formulation: With patient Time For Goal Achievement: 05/02/19 Potential to Achieve Goals: Good  OT Frequency: Min 3X/week    AM-PAC OT "6 Clicks" Daily Activity     Outcome Measure Help from another person eating meals?: None Help from another person taking care of personal grooming?: None Help from another person toileting, which includes using toliet, bedpan, or urinal?: A Little Help from another person bathing (including washing, rinsing, drying)?: A Little Help from another person to put on and taking off regular upper body clothing?: None Help from another person to put on and taking off regular lower body clothing?: A Little 6 Click Score: 21   End of Session Nurse Communication: Other (comment)(Blood coming from IV)  Activity Tolerance: Patient tolerated treatment well Patient left: in bed;with call bell/phone within reach;with bed alarm set  OT Visit Diagnosis: Unsteadiness on feet (R26.81);Muscle weakness (generalized) (M62.81)                Time: 7893-8101 OT Time Calculation (min): 16 min Charges:  OT General Charges $OT Visit: 1 Visit OT Evaluation $OT Eval Low Complexity: 1 Low  Curtis Sites OTR/L  04/26/2019, 1:17 PM

## 2019-04-26 NOTE — Progress Notes (Signed)
STROKE TEAM PROGRESS NOTE   INTERVAL HISTORY I have personally reviewed history of presenting illness with the patient and reviewed imaging films in PACS.  She presented with transient left-sided weakness and numbness which resolved within an hour or so.  MRI is negative for acute stroke but does show multiple bilateral lacunar infarcts.  She denies prior history of strokes or TIAs.  CT angiogram shows no significant intracranial or extracranial stenosis.  Transthoracic echo shows normal ejection fraction.  LDL cholesterol is elevated at 143 and hemoglobin A1c is 5.3  Vitals:   04/25/19 2329 04/26/19 0320 04/26/19 0841 04/26/19 1118  BP: (!) 148/52 (!) 158/86 (!) 164/64 (!) 168/74  Pulse: 95 (!) 102 (!) 109 74  Resp: 18 18 15 16   Temp: 98.8 F (37.1 C) 99.1 F (37.3 C) 98.4 F (36.9 C) 98.8 F (37.1 C)  TempSrc: Oral Oral Oral Oral  SpO2: 98% 98% 98% 98%  Weight:      Height:        CBC:  Recent Labs  Lab 04/25/19 0842 04/25/19 0851  WBC 6.3  --   NEUTROABS 4.1  --   HGB 12.1 12.6  HCT 37.8 37.0  MCV 90.9  --   PLT 214  --     Basic Metabolic Panel:  Recent Labs  Lab 04/25/19 0842 04/25/19 0851 04/26/19 0341  NA 133* 133* 134*  K 3.2* 3.1* 4.0  CL 99 99 99  CO2 22  --  24  GLUCOSE 126* 123* 110*  BUN 11 11 15   CREATININE 0.89 0.80 1.10*  CALCIUM 8.7*  --  8.6*  MG  --   --  2.0   Lipid Panel:     Component Value Date/Time   CHOL 220 (H) 04/26/2019 0341   TRIG 77 04/26/2019 0341   HDL 62 04/26/2019 0341   CHOLHDL 3.5 04/26/2019 0341   VLDL 15 04/26/2019 0341   LDLCALC 143 (H) 04/26/2019 0341   HgbA1c:  Lab Results  Component Value Date   HGBA1C 5.3 04/26/2019   Urine Drug Screen: No results found for: LABOPIA, COCAINSCRNUR, LABBENZ, AMPHETMU, THCU, LABBARB  Alcohol Level No results found for: ETH  IMAGING Ct Angio Head W Or Wo Contrast  Result Date: 04/25/2019 CLINICAL DATA:  Acute onset of left-sided weakness beginning this morning. EXAM: CT  ANGIOGRAPHY HEAD AND NECK TECHNIQUE: Multidetector CT imaging of the head and neck was performed using the standard protocol during bolus administration of intravenous contrast. Multiplanar CT image reconstructions and MIPs were obtained to evaluate the vascular anatomy. Carotid stenosis measurements (when applicable) are obtained utilizing NASCET criteria, using the distal internal carotid diameter as the denominator. CONTRAST:  73mL OMNIPAQUE IOHEXOL 350 MG/ML SOLN COMPARISON:  None. FINDINGS: CT HEAD FINDINGS Brain: Lacunar infarcts in the basal ganglia bilaterally appear remote. There is also a remote lacunar infarct in the right centrum semi ovale moderate atrophy and white matter disease is present bilaterally. The ventricles are proportionate to the degree of atrophy. The brainstem and cerebellum are within normal limits. No acute hemorrhage or mass lesion is present. No significant extraaxial fluid collection is present. Vascular: Dense atherosclerotic calcifications are present in the cavernous internal carotid arteries bilaterally. There is no hyperdense vessel. Skull: Calvarium is intact. No focal lytic or blastic lesions are present. Sinuses: There is some fluid in chronic sclerosis in the right mastoid air cells. No obstructing nasopharyngeal lesion is present. The paranasal sinuses and mastoid air cells are otherwise clear. Orbits: Bilateral lens replacements  are noted. Globes and orbits are otherwise unremarkable. Review of the MIP images confirms the above findings CTA NECK FINDINGS Aortic arch: Atherosclerotic changes are noted at the aortic arch. There is no aneurysm. There is no significant stenosis of the great vessel origins. A 3 vessel arch configuration is present. Right carotid system: The right common carotid artery is within normal limits. Bifurcation demonstrates mild atherosclerotic changes without significant stenosis. There is beaded irregularity of the mid cervical right ICA without  focal stenosis or dissection. Left carotid system: The left common carotid artery is within normal limits. Minimal atherosclerotic changes are noted at the bifurcation without a significant stenosis. There is beaded irregularity of the mid cervical left ICA without focal stenosis or dissection. Vertebral arteries: Vertebral arteries are codominant. Both vertebral arteries originate from the subclavian arteries without a significant stenosis at the origin. There is no focal stenosis or injury to either vertebral artery in the neck. No focal vascular irregularity is present. Skeleton: Multilevel endplate degenerative changes are noted in the cervical spine. Uncovertebral spurring contributes to foraminal narrowing bilaterally at C5-6 and C6-7. No focal lytic or blastic lesions are present. Other neck: No focal mucosal or submucosal lesions are present. Salivary glands are within normal limits. No significant adenopathy is present. The thyroid is either markedly atrophic or has been partially resected. There is some residual thyroid tissue on the left. Minimal tissue is present on the right. Upper chest: Calcific scarring is present at the left lung apex. Lungs are otherwise clear. No focal nodule, mass, or airspace disease is present. Thoracic inlet is within normal limits. Review of the MIP images confirms the above findings CTA HEAD FINDINGS Anterior circulation: Atherosclerotic calcifications are present in the cavernous internal carotid arteries. There is no significant stenosis from the skull base through the ICA termini. The A1 and M1 segments are normal. The anterior communicating artery is patent. The MCA bifurcations are intact. There is some attenuation of distal MCA and ACA branch vessels without a significant proximal stenosis, aneurysm, or branch vessel occlusion. Posterior circulation: The left vertebral artery is slightly dominant. PICA origins are visualized and normal. The basilar artery is small. P1  segments contribute with prominent posterior communicating arteries bilaterally to the posterior cerebral arteries. There is a high-grade stenosis of the proximal left P2 segment. PCA branch vessels are intact bilaterally. Venous sinuses: The dural sinuses are patent. Straight sinus and deep cerebral veins are intact. Cortical veins are unremarkable. Anatomic variants: Prominent posterior communicating arteries bilaterally. Review of the MIP images confirms the above findings IMPRESSION: 1. No acute intracranial abnormality. 2. Lacunar infarcts of the basal ganglia appear remote. 3. High-grade stenosis of the left P2 segment. 4. No emergent large vessel occlusion or other significant stenosis in the proximal circle-of-Willis. 5. Mild distal small vessel disease throughout the circle-of-Willis. 6. Beaded irregularity of the cervical internal carotid arteries bilaterally consistent with fibromuscular dysplasia. There is no significant stenosis or dissection. Electronically Signed   By: San Morelle M.D.   On: 04/25/2019 12:02   Ct Angio Neck W Or Wo Contrast  Result Date: 04/25/2019 CLINICAL DATA:  Acute onset of left-sided weakness beginning this morning. EXAM: CT ANGIOGRAPHY HEAD AND NECK TECHNIQUE: Multidetector CT imaging of the head and neck was performed using the standard protocol during bolus administration of intravenous contrast. Multiplanar CT image reconstructions and MIPs were obtained to evaluate the vascular anatomy. Carotid stenosis measurements (when applicable) are obtained utilizing NASCET criteria, using the distal internal carotid diameter as  the denominator. CONTRAST:  50mL OMNIPAQUE IOHEXOL 350 MG/ML SOLN COMPARISON:  None. FINDINGS: CT HEAD FINDINGS Brain: Lacunar infarcts in the basal ganglia bilaterally appear remote. There is also a remote lacunar infarct in the right centrum semi ovale moderate atrophy and white matter disease is present bilaterally. The ventricles are  proportionate to the degree of atrophy. The brainstem and cerebellum are within normal limits. No acute hemorrhage or mass lesion is present. No significant extraaxial fluid collection is present. Vascular: Dense atherosclerotic calcifications are present in the cavernous internal carotid arteries bilaterally. There is no hyperdense vessel. Skull: Calvarium is intact. No focal lytic or blastic lesions are present. Sinuses: There is some fluid in chronic sclerosis in the right mastoid air cells. No obstructing nasopharyngeal lesion is present. The paranasal sinuses and mastoid air cells are otherwise clear. Orbits: Bilateral lens replacements are noted. Globes and orbits are otherwise unremarkable. Review of the MIP images confirms the above findings CTA NECK FINDINGS Aortic arch: Atherosclerotic changes are noted at the aortic arch. There is no aneurysm. There is no significant stenosis of the great vessel origins. A 3 vessel arch configuration is present. Right carotid system: The right common carotid artery is within normal limits. Bifurcation demonstrates mild atherosclerotic changes without significant stenosis. There is beaded irregularity of the mid cervical right ICA without focal stenosis or dissection. Left carotid system: The left common carotid artery is within normal limits. Minimal atherosclerotic changes are noted at the bifurcation without a significant stenosis. There is beaded irregularity of the mid cervical left ICA without focal stenosis or dissection. Vertebral arteries: Vertebral arteries are codominant. Both vertebral arteries originate from the subclavian arteries without a significant stenosis at the origin. There is no focal stenosis or injury to either vertebral artery in the neck. No focal vascular irregularity is present. Skeleton: Multilevel endplate degenerative changes are noted in the cervical spine. Uncovertebral spurring contributes to foraminal narrowing bilaterally at C5-6 and  C6-7. No focal lytic or blastic lesions are present. Other neck: No focal mucosal or submucosal lesions are present. Salivary glands are within normal limits. No significant adenopathy is present. The thyroid is either markedly atrophic or has been partially resected. There is some residual thyroid tissue on the left. Minimal tissue is present on the right. Upper chest: Calcific scarring is present at the left lung apex. Lungs are otherwise clear. No focal nodule, mass, or airspace disease is present. Thoracic inlet is within normal limits. Review of the MIP images confirms the above findings CTA HEAD FINDINGS Anterior circulation: Atherosclerotic calcifications are present in the cavernous internal carotid arteries. There is no significant stenosis from the skull base through the ICA termini. The A1 and M1 segments are normal. The anterior communicating artery is patent. The MCA bifurcations are intact. There is some attenuation of distal MCA and ACA branch vessels without a significant proximal stenosis, aneurysm, or branch vessel occlusion. Posterior circulation: The left vertebral artery is slightly dominant. PICA origins are visualized and normal. The basilar artery is small. P1 segments contribute with prominent posterior communicating arteries bilaterally to the posterior cerebral arteries. There is a high-grade stenosis of the proximal left P2 segment. PCA branch vessels are intact bilaterally. Venous sinuses: The dural sinuses are patent. Straight sinus and deep cerebral veins are intact. Cortical veins are unremarkable. Anatomic variants: Prominent posterior communicating arteries bilaterally. Review of the MIP images confirms the above findings IMPRESSION: 1. No acute intracranial abnormality. 2. Lacunar infarcts of the basal ganglia appear remote. 3. High-grade  stenosis of the left P2 segment. 4. No emergent large vessel occlusion or other significant stenosis in the proximal circle-of-Willis. 5. Mild  distal small vessel disease throughout the circle-of-Willis. 6. Beaded irregularity of the cervical internal carotid arteries bilaterally consistent with fibromuscular dysplasia. There is no significant stenosis or dissection. Electronically Signed   By: San Morelle M.D.   On: 04/25/2019 12:02   Mr Brain Wo Contrast  Result Date: 04/25/2019 CLINICAL DATA:  Hypertension and hyperlipidemia. Acute presentation with left-sided weakness. EXAM: MRI HEAD WITHOUT CONTRAST TECHNIQUE: Multiplanar, multiecho pulse sequences of the brain and surrounding structures were obtained without intravenous contrast. COMPARISON:  CT studies earlier same day. FINDINGS: Brain: Diffusion imaging does not show any acute or subacute infarction. Brainstem and cerebellum are normal. Cerebral hemispheres show old small vessel infarctions affecting the thalami, basal ganglia and hemispheric deep and subcortical white matter. No large vessel territory infarction. No mass lesion, recent hemorrhage, hydrocephalus or extra-axial collection. Vascular: Major vessels at the base of the brain show flow. Skull and upper cervical spine: Negative Sinuses/Orbits: Paranasal sinuses are clear. Orbits are negative. There is an extensive mastoid effusion on the right. Other: None IMPRESSION: No acute intracranial finding by MRI. Extensive chronic small-vessel ischemic changes affecting the thalami, basal ganglia and hemispheric white matter. Extensive mastoid effusion on the right. Electronically Signed   By: Nelson Chimes M.D.   On: 04/25/2019 14:09    PHYSICAL EXAM Pleasant elderly Caucasian lady not in distress. . Afebrile. Head is nontraumatic. Neck is supple without bruit.    Cardiac exam no murmur or gallop. Lungs are clear to auscultation. Distal pulses are well felt. Neurological Exam ;  Awake  Alert oriented x 3. Normal speech and language.eye movements full without nystagmus.fundi were not visualized. Vision acuity and fields appear  normal. Hearing is normal. Palatal movements are normal. Face symmetric. Tongue midline. Normal strength, tone, reflexes and coordination. Normal sensation. Gait deferred.  ASSESSMENT/PLAN [name] is a 84 y.o. female with history of HTN and HLD presenting with L sided weakness and numbness that is improving.   Rt brainTIA from small vessel disease  CTA head & neck no acute abnormality.  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  L P2 high-grade stenosis.  No LVO.  Mild small vessel disease throughout COW.  Beaded cervical B ICAs.  MRI no acute stroke.  Extensive small vessel disease with thalamic basal ganglia and hemispheric white matter ischemic changes  2D Echo EF 60-65%. No source of embolus   LDL 143  HgbA1c 5.3  Lovenox 40 mg sq daily for VTE prophylaxis  No antithrombotic prior to admission, now on aspirin 81 mg daily and clopidogrel 75 mg daily.  Continue DAPT x3 weeks then aspirin alone.  Therapy recommendations: HH OT, no PT, no SLP, supervision  Disposition:  Return home  Benign essential hypertension  Stable . Permissive hypertension (OK if < 220/120) but gradually normalize in 5-7 days . Long-term BP goal normotensive  Hyperlipidemia  Home meds:  No statin  Now on Lipitor 20  States she prefers Crestor, will change  LDL 143, goal < 70  Continue statin at discharge  Other Stroke Risk Factors  Advanced age  Other Active Problems  Hypokalemia  Hyponatremia  Hypocalcemia  Hypothyroidism  Hospital day # 0  I have personally obtained history,examined this patient, reviewed notes, independently viewed imaging studies, participated in medical decision making and plan of care.ROS completed by me personally and pertinent positives fully documented  I have made  any additions or clarifications directly to the above note.  She presented with right brain TIA likely from small vessel disease.  Stroke evaluation is completed she has hyperlipidemia.  Recommend  aspirin Plavix for 3 weeks followed by aspirin alone and statin.  Follow-up as an outpatient stroke clinic.  Discussed with Dr. Eliseo Squires.  Greater than 50% time during this 25-minute visit was spent on counseling and coordination of care about TIA and stroke prevention and answering questions.  Antony Contras, MD Medical Director [facility] Pager: [phone] 04/26/2019 4:19 PM   To contact Stroke Continuity provider, please refer to [email or web address] After hours, contact General Neurology

## 2019-04-26 NOTE — Discharge Summary (Signed)
Physician Discharge Summary  Tammy Allison IHK:[id number] DOB: 27-Jul-1935 DOA: 04/25/2019  PCP: [name], MD  Admit date: 04/25/2019 Discharge date: 04/26/2019  Admitted From: home Discharge disposition: home   Recommendations for Outpatient Follow-Up:   1. Home health 2. ASA/plavix x 3 weeks then ASA alone 3. crestor started-- follow LFTs/FLP   Discharge Diagnosis:   Principal Problem:   Cerebral thrombosis with transient ischemic attack (TIA) Active Problems:   Hypokalemia   Hyponatremia   Benign essential hypertension   Hyperlipidemia LDL goal <100   Hypothyroid   Hypocalcemia    Discharge Condition: Improved.  Diet recommendation: Low sodium, heart healthy.  Wound care: None.  Code status: Full.   History of Present Illness:   Tammy Allison is a 84 y.o. female with medical history significant of hypertension and hyperlipidemia who presents the emergency department with reports of left-sided weakness.  She awoke at 6 AM and was able to walk to the bathroom and had no weakness.  To sleep and awoke again at 730 was weak on her left side and unable to move out of the bed.  Her husband helped her moved to the ground.  She reports having improvement of her symptoms and EMS reported that her symptoms had almost completely resolved on their arrival she denies any history of seizures, stroke, previous weakness, or diabetes.  She does have a history of hypertension and has not taken her antihypertensive medications yet this morning.  She denies any history of cough, chills, or COVID exposure.  In the emergency department she was rapidly evaluated via code stroke and Dr. Leonel Ramsay from neurology saw the patient and his note is noted.    Hospital Course by Problem:    transient ischemic attack:  MRI negative Echo:1. The left ventricle has normal systolic function with an ejection fraction of 60-65%. The cavity size was normal. Left ventricular diastolic  Doppler parameters are consistent with impaired relaxation. No evidence of left ventricular regional wall  motion abnormalities. Seen by neuro: ASA/plavix x 3 week Benign essential hypertension:  Permissive HTN with gradual return to normal  Hypokalemia:  -repleted   Hyponatremia:  -chronic and stable  Hypothyroidism: Continue Synthroid at home doses.    Hyperlipidemia:  LDL goal of 70 crestor started    Medical Consultants:    neurology  Discharge Exam:   Vitals:   04/26/19 0841 04/26/19 1118  BP: (!) 164/64 (!) 168/74  Pulse: (!) 109 74  Resp: 15 16  Temp: 98.4 F (36.9 C) 98.8 F (37.1 C)  SpO2: 98% 98%   Vitals:   04/25/19 2329 04/26/19 0320 04/26/19 0841 04/26/19 1118  BP: (!) 148/52 (!) 158/86 (!) 164/64 (!) 168/74  Pulse: 95 (!) 102 (!) 109 74  Resp: 18 18 15 16   Temp: 98.8 F (37.1 C) 99.1 F (37.3 C) 98.4 F (36.9 C) 98.8 F (37.1 C)  TempSrc: Oral Oral Oral Oral  SpO2: 98% 98% 98% 98%  Weight:      Height:        General exam: Appears calm and comfortable.     The results of significant diagnostics from this hospitalization (including imaging, microbiology, ancillary and laboratory) are listed below for reference.     Procedures and Diagnostic Studies:   Ct Angio Head W Or Wo Contrast  Result Date: 04/25/2019 CLINICAL DATA:  Acute onset of left-sided weakness beginning this morning. EXAM: CT ANGIOGRAPHY HEAD AND NECK TECHNIQUE: Multidetector CT imaging of the head  and neck was performed using the standard protocol during bolus administration of intravenous contrast. Multiplanar CT image reconstructions and MIPs were obtained to evaluate the vascular anatomy. Carotid stenosis measurements (when applicable) are obtained utilizing NASCET criteria, using the distal internal carotid diameter as the denominator. CONTRAST:  27mL OMNIPAQUE IOHEXOL 350 MG/ML SOLN COMPARISON:  None. FINDINGS: CT HEAD FINDINGS Brain: Lacunar infarcts in the basal  ganglia bilaterally appear remote. There is also a remote lacunar infarct in the right centrum semi ovale moderate atrophy and white matter disease is present bilaterally. The ventricles are proportionate to the degree of atrophy. The brainstem and cerebellum are within normal limits. No acute hemorrhage or mass lesion is present. No significant extraaxial fluid collection is present. Vascular: Dense atherosclerotic calcifications are present in the cavernous internal carotid arteries bilaterally. There is no hyperdense vessel. Skull: Calvarium is intact. No focal lytic or blastic lesions are present. Sinuses: There is some fluid in chronic sclerosis in the right mastoid air cells. No obstructing nasopharyngeal lesion is present. The paranasal sinuses and mastoid air cells are otherwise clear. Orbits: Bilateral lens replacements are noted. Globes and orbits are otherwise unremarkable. Review of the MIP images confirms the above findings CTA NECK FINDINGS Aortic arch: Atherosclerotic changes are noted at the aortic arch. There is no aneurysm. There is no significant stenosis of the great vessel origins. A 3 vessel arch configuration is present. Right carotid system: The right common carotid artery is within normal limits. Bifurcation demonstrates mild atherosclerotic changes without significant stenosis. There is beaded irregularity of the mid cervical right ICA without focal stenosis or dissection. Left carotid system: The left common carotid artery is within normal limits. Minimal atherosclerotic changes are noted at the bifurcation without a significant stenosis. There is beaded irregularity of the mid cervical left ICA without focal stenosis or dissection. Vertebral arteries: Vertebral arteries are codominant. Both vertebral arteries originate from the subclavian arteries without a significant stenosis at the origin. There is no focal stenosis or injury to either vertebral artery in the neck. No focal vascular  irregularity is present. Skeleton: Multilevel endplate degenerative changes are noted in the cervical spine. Uncovertebral spurring contributes to foraminal narrowing bilaterally at C5-6 and C6-7. No focal lytic or blastic lesions are present. Other neck: No focal mucosal or submucosal lesions are present. Salivary glands are within normal limits. No significant adenopathy is present. The thyroid is either markedly atrophic or has been partially resected. There is some residual thyroid tissue on the left. Minimal tissue is present on the right. Upper chest: Calcific scarring is present at the left lung apex. Lungs are otherwise clear. No focal nodule, mass, or airspace disease is present. Thoracic inlet is within normal limits. Review of the MIP images confirms the above findings CTA HEAD FINDINGS Anterior circulation: Atherosclerotic calcifications are present in the cavernous internal carotid arteries. There is no significant stenosis from the skull base through the ICA termini. The A1 and M1 segments are normal. The anterior communicating artery is patent. The MCA bifurcations are intact. There is some attenuation of distal MCA and ACA branch vessels without a significant proximal stenosis, aneurysm, or branch vessel occlusion. Posterior circulation: The left vertebral artery is slightly dominant. PICA origins are visualized and normal. The basilar artery is small. P1 segments contribute with prominent posterior communicating arteries bilaterally to the posterior cerebral arteries. There is a high-grade stenosis of the proximal left P2 segment. PCA branch vessels are intact bilaterally. Venous sinuses: The dural sinuses are patent. Straight  sinus and deep cerebral veins are intact. Cortical veins are unremarkable. Anatomic variants: Prominent posterior communicating arteries bilaterally. Review of the MIP images confirms the above findings IMPRESSION: 1. No acute intracranial abnormality. 2. Lacunar infarcts of  the basal ganglia appear remote. 3. High-grade stenosis of the left P2 segment. 4. No emergent large vessel occlusion or other significant stenosis in the proximal circle-of-Willis. 5. Mild distal small vessel disease throughout the circle-of-Willis. 6. Beaded irregularity of the cervical internal carotid arteries bilaterally consistent with fibromuscular dysplasia. There is no significant stenosis or dissection. Electronically Signed   By: San Morelle M.D.   On: 04/25/2019 12:02   Ct Angio Neck W Or Wo Contrast  Result Date: 04/25/2019 CLINICAL DATA:  Acute onset of left-sided weakness beginning this morning. EXAM: CT ANGIOGRAPHY HEAD AND NECK TECHNIQUE: Multidetector CT imaging of the head and neck was performed using the standard protocol during bolus administration of intravenous contrast. Multiplanar CT image reconstructions and MIPs were obtained to evaluate the vascular anatomy. Carotid stenosis measurements (when applicable) are obtained utilizing NASCET criteria, using the distal internal carotid diameter as the denominator. CONTRAST:  28mL OMNIPAQUE IOHEXOL 350 MG/ML SOLN COMPARISON:  None. FINDINGS: CT HEAD FINDINGS Brain: Lacunar infarcts in the basal ganglia bilaterally appear remote. There is also a remote lacunar infarct in the right centrum semi ovale moderate atrophy and white matter disease is present bilaterally. The ventricles are proportionate to the degree of atrophy. The brainstem and cerebellum are within normal limits. No acute hemorrhage or mass lesion is present. No significant extraaxial fluid collection is present. Vascular: Dense atherosclerotic calcifications are present in the cavernous internal carotid arteries bilaterally. There is no hyperdense vessel. Skull: Calvarium is intact. No focal lytic or blastic lesions are present. Sinuses: There is some fluid in chronic sclerosis in the right mastoid air cells. No obstructing nasopharyngeal lesion is present. The paranasal  sinuses and mastoid air cells are otherwise clear. Orbits: Bilateral lens replacements are noted. Globes and orbits are otherwise unremarkable. Review of the MIP images confirms the above findings CTA NECK FINDINGS Aortic arch: Atherosclerotic changes are noted at the aortic arch. There is no aneurysm. There is no significant stenosis of the great vessel origins. A 3 vessel arch configuration is present. Right carotid system: The right common carotid artery is within normal limits. Bifurcation demonstrates mild atherosclerotic changes without significant stenosis. There is beaded irregularity of the mid cervical right ICA without focal stenosis or dissection. Left carotid system: The left common carotid artery is within normal limits. Minimal atherosclerotic changes are noted at the bifurcation without a significant stenosis. There is beaded irregularity of the mid cervical left ICA without focal stenosis or dissection. Vertebral arteries: Vertebral arteries are codominant. Both vertebral arteries originate from the subclavian arteries without a significant stenosis at the origin. There is no focal stenosis or injury to either vertebral artery in the neck. No focal vascular irregularity is present. Skeleton: Multilevel endplate degenerative changes are noted in the cervical spine. Uncovertebral spurring contributes to foraminal narrowing bilaterally at C5-6 and C6-7. No focal lytic or blastic lesions are present. Other neck: No focal mucosal or submucosal lesions are present. Salivary glands are within normal limits. No significant adenopathy is present. The thyroid is either markedly atrophic or has been partially resected. There is some residual thyroid tissue on the left. Minimal tissue is present on the right. Upper chest: Calcific scarring is present at the left lung apex. Lungs are otherwise clear. No focal nodule, mass, or  airspace disease is present. Thoracic inlet is within normal limits. Review of the MIP  images confirms the above findings CTA HEAD FINDINGS Anterior circulation: Atherosclerotic calcifications are present in the cavernous internal carotid arteries. There is no significant stenosis from the skull base through the ICA termini. The A1 and M1 segments are normal. The anterior communicating artery is patent. The MCA bifurcations are intact. There is some attenuation of distal MCA and ACA branch vessels without a significant proximal stenosis, aneurysm, or branch vessel occlusion. Posterior circulation: The left vertebral artery is slightly dominant. PICA origins are visualized and normal. The basilar artery is small. P1 segments contribute with prominent posterior communicating arteries bilaterally to the posterior cerebral arteries. There is a high-grade stenosis of the proximal left P2 segment. PCA branch vessels are intact bilaterally. Venous sinuses: The dural sinuses are patent. Straight sinus and deep cerebral veins are intact. Cortical veins are unremarkable. Anatomic variants: Prominent posterior communicating arteries bilaterally. Review of the MIP images confirms the above findings IMPRESSION: 1. No acute intracranial abnormality. 2. Lacunar infarcts of the basal ganglia appear remote. 3. High-grade stenosis of the left P2 segment. 4. No emergent large vessel occlusion or other significant stenosis in the proximal circle-of-Willis. 5. Mild distal small vessel disease throughout the circle-of-Willis. 6. Beaded irregularity of the cervical internal carotid arteries bilaterally consistent with fibromuscular dysplasia. There is no significant stenosis or dissection. Electronically Signed   By: San Morelle M.D.   On: 04/25/2019 12:02   Mr Brain Wo Contrast  Result Date: 04/25/2019 CLINICAL DATA:  Hypertension and hyperlipidemia. Acute presentation with left-sided weakness. EXAM: MRI HEAD WITHOUT CONTRAST TECHNIQUE: Multiplanar, multiecho pulse sequences of the brain and surrounding  structures were obtained without intravenous contrast. COMPARISON:  CT studies earlier same day. FINDINGS: Brain: Diffusion imaging does not show any acute or subacute infarction. Brainstem and cerebellum are normal. Cerebral hemispheres show old small vessel infarctions affecting the thalami, basal ganglia and hemispheric deep and subcortical white matter. No large vessel territory infarction. No mass lesion, recent hemorrhage, hydrocephalus or extra-axial collection. Vascular: Major vessels at the base of the brain show flow. Skull and upper cervical spine: Negative Sinuses/Orbits: Paranasal sinuses are clear. Orbits are negative. There is an extensive mastoid effusion on the right. Other: None IMPRESSION: No acute intracranial finding by MRI. Extensive chronic small-vessel ischemic changes affecting the thalami, basal ganglia and hemispheric white matter. Extensive mastoid effusion on the right. Electronically Signed   By: Nelson Chimes M.D.   On: 04/25/2019 14:09     Labs:   Basic Metabolic Panel: Recent Labs  Lab 04/25/19 0842 04/25/19 0851 04/26/19 0341  NA 133* 133* 134*  K 3.2* 3.1* 4.0  CL 99 99 99  CO2 22  --  24  GLUCOSE 126* 123* 110*  BUN 11 11 15   CREATININE 0.89 0.80 1.10*  CALCIUM 8.7*  --  8.6*  MG  --   --  2.0   GFR Estimated Creatinine Clearance: 34 mL/min (A) (by C-G formula based on SCr of 1.1 mg/dL (H)). Liver Function Tests: Recent Labs  Lab 04/25/19 0842  AST 18  ALT 14  ALKPHOS 44  BILITOT 0.8  PROT 6.2*  ALBUMIN 3.6   No results for input(s): LIPASE, AMYLASE in the last 168 hours. No results for input(s): AMMONIA in the last 168 hours. Coagulation profile Recent Labs  Lab 04/25/19 0842  INR 1.0    CBC: Recent Labs  Lab 04/25/19 0842 04/25/19 0851  WBC 6.3  --  NEUTROABS 4.1  --   HGB 12.1 12.6  HCT 37.8 37.0  MCV 90.9  --   PLT 214  --    Cardiac Enzymes: No results for input(s): CKTOTAL, CKMB, CKMBINDEX, TROPONINI in the last 168  hours. BNP: Invalid input(s): POCBNP CBG: Recent Labs  Lab 04/25/19 1015  GLUCAP 111*   D-Dimer No results for input(s): DDIMER in the last 72 hours. Hgb A1c Recent Labs    04/26/19 0341  HGBA1C 5.3   Lipid Profile Recent Labs    04/26/19 0341  CHOL 220*  HDL 62  LDLCALC 143*  TRIG 77  CHOLHDL 3.5   Thyroid function studies No results for input(s): TSH, T4TOTAL, T3FREE, THYROIDAB in the last 72 hours.  Invalid input(s): FREET3 Anemia work up No results for input(s): VITAMINB12, FOLATE, FERRITIN, TIBC, IRON, RETICCTPCT in the last 72 hours. Microbiology Recent Results (from the past 240 hour(s))  SARS Coronavirus 2     Status: None   Collection Time: 04/25/19 11:52 AM  Result Value Ref Range Status   SARS Coronavirus 2 NOT DETECTED NOT DETECTED Final    Comment: (NOTE) SARS-CoV-2 target nucleic acids are NOT DETECTED. The SARS-CoV-2 RNA is generally detectable in upper and lower respiratory specimens during the acute phase of infection.  Negative  results do not preclude SARS-CoV-2 infection, do not rule out co-infections with other pathogens, and should not be used as the sole basis for treatment or other patient management decisions.  Negative results must be combined with clinical observations, patient history, and epidemiological information. The expected result is Not Detected. Fact Sheet for Patients: http://www.biofiredefense.com/wp-content/uploads/2020/03/BIOFIRE-COVID -19-patients.pdf Fact Sheet for Healthcare Providers: http://www.biofiredefense.com/wp-content/uploads/2020/03/BIOFIRE-COVID -19-hcp.pdf This test is not yet approved or cleared by the [place] and  has been authorized for detection and/or diagnosis of SARS-CoV-2 by FDA under an Emergency Use Authorization (EUA).  This EUA will remain in effec t (meaning this test can be used) for the duration of  the COVID-19 declaration under Section 564(b)(1) of the Act, 21 U.S.C. section  360bbb-3(b)(1), unless the authorization is terminated or revoked sooner. Performed at [facility], [place] [address] [place], [place] [address]      Discharge Instructions:   Discharge Instructions    Ambulatory referral to Neurology   Complete by:  As directed    An appointment is requested in approximately: 4 weeks   Diet - low sodium heart healthy   Complete by:  As directed    Discharge instructions   Complete by:  As directed    ASA/plavix together for 3 weeks then ASA alone   Increase activity slowly   Complete by:  As directed      Allergies as of 04/26/2019      Reactions   Sulfa Antibiotics Hives   Carvedilol Rash      Medication List    TAKE these medications   Align 4 MG Caps Take 4 mg by mouth daily.   aspirin 81 MG EC tablet Take 1 tablet (81 mg total) by mouth daily. Start taking on:  April 27, 2019   cetirizine 10 MG tablet Commonly known as:  ZYRTEC Take 5 mg by mouth at bedtime.   cholecalciferol 1000 units tablet Commonly known as:  VITAMIN D Take 1,000 Units by mouth every morning.   clopidogrel 75 MG tablet Commonly known as:  PLAVIX Take 1 tablet (75 mg total) by mouth daily. Start taking on:  April 27, 2019   hydrALAZINE 50 MG tablet Commonly  known as:  APRESOLINE Take 1 tablet (50 mg total) by mouth 3 (three) times daily. Start taking on:  April 28, 2019 What changed:  These instructions start on April 28, 2019. If you are unsure what to do until then, ask your doctor or other care provider.   levothyroxine 88 MCG tablet Commonly known as:  SYNTHROID Take 88 mcg by mouth daily before breakfast.   losartan 100 MG tablet Commonly known as:  COZAAR Take 1 tablet (100 mg total) by mouth daily. Start taking on:  April 29, 2019 What changed:  These instructions start on April 29, 2019. If you are unsure what to do until then, ask your doctor or other care provider.   metoprolol tartrate 100 MG tablet Commonly known as:   LOPRESSOR Take 1 tablet (100 mg total) by mouth 2 (two) times daily.   pantoprazole 40 MG tablet Commonly known as:  PROTONIX Take 40 mg by mouth every morning.   PRESERVISION AREDS 2 PO Take 1 capsule by mouth daily.   rosuvastatin 5 MG tablet Commonly known as:  Crestor Take 1 tablet (5 mg total) by mouth at bedtime.      Follow-up Information    [name], MD Follow up in 1 week(s).   Specialty:  Family Medicine Contact information: [place] [address] [phone]        Belva Crome, MD .   Specialty:  Cardiology Contact information: [id number] [place] [address] [phone]            Time coordinating discharge: 25 min  Signed:  Geradine Girt DO  [facility] 04/26/2019, 2:05 PM

## 2019-04-27 ENCOUNTER — Telehealth: Payer: Self-pay | Admitting: Interventional Cardiology

## 2019-04-27 NOTE — Telephone Encounter (Signed)
Patient was recently discharged and was told to follow up with Dr. [name]. She prefers to see Dr. [name] in person.  Please try to schedule her on his next DOD day, Friday July 10

## 2019-04-28 DIAGNOSIS — I773 Arterial fibromuscular dysplasia: Secondary | ICD-10-CM | POA: Diagnosis not present

## 2019-04-28 DIAGNOSIS — Z7982 Long term (current) use of aspirin: Secondary | ICD-10-CM | POA: Diagnosis not present

## 2019-04-28 DIAGNOSIS — Z9181 History of falling: Secondary | ICD-10-CM | POA: Diagnosis not present

## 2019-04-28 DIAGNOSIS — I700 Atherosclerosis of aorta: Secondary | ICD-10-CM | POA: Diagnosis not present

## 2019-04-28 DIAGNOSIS — R319 Hematuria, unspecified: Secondary | ICD-10-CM | POA: Diagnosis not present

## 2019-04-28 DIAGNOSIS — E785 Hyperlipidemia, unspecified: Secondary | ICD-10-CM | POA: Diagnosis not present

## 2019-04-28 DIAGNOSIS — Z7902 Long term (current) use of antithrombotics/antiplatelets: Secondary | ICD-10-CM | POA: Diagnosis not present

## 2019-04-28 DIAGNOSIS — Z961 Presence of intraocular lens: Secondary | ICD-10-CM | POA: Diagnosis not present

## 2019-04-28 DIAGNOSIS — E876 Hypokalemia: Secondary | ICD-10-CM | POA: Diagnosis not present

## 2019-04-28 DIAGNOSIS — I6523 Occlusion and stenosis of bilateral carotid arteries: Secondary | ICD-10-CM | POA: Diagnosis not present

## 2019-04-28 DIAGNOSIS — H7011 Chronic mastoiditis, right ear: Secondary | ICD-10-CM | POA: Diagnosis not present

## 2019-04-28 DIAGNOSIS — I10 Essential (primary) hypertension: Secondary | ICD-10-CM | POA: Diagnosis not present

## 2019-04-28 DIAGNOSIS — E871 Hypo-osmolality and hyponatremia: Secondary | ICD-10-CM | POA: Diagnosis not present

## 2019-04-28 DIAGNOSIS — R000 Tachycardia, unspecified: Secondary | ICD-10-CM | POA: Diagnosis not present

## 2019-04-28 DIAGNOSIS — M47812 Spondylosis without myelopathy or radiculopathy, cervical region: Secondary | ICD-10-CM | POA: Diagnosis not present

## 2019-04-28 DIAGNOSIS — E039 Hypothyroidism, unspecified: Secondary | ICD-10-CM | POA: Diagnosis not present

## 2019-04-28 DIAGNOSIS — G459 Transient cerebral ischemic attack, unspecified: Secondary | ICD-10-CM | POA: Diagnosis not present

## 2019-04-28 DIAGNOSIS — G8194 Hemiplegia, unspecified affecting left nondominant side: Secondary | ICD-10-CM | POA: Diagnosis not present

## 2019-04-28 DIAGNOSIS — M5116 Intervertebral disc disorders with radiculopathy, lumbar region: Secondary | ICD-10-CM | POA: Diagnosis not present

## 2019-04-28 DIAGNOSIS — E440 Moderate protein-calorie malnutrition: Secondary | ICD-10-CM | POA: Diagnosis not present

## 2019-04-28 NOTE — Telephone Encounter (Signed)
Spoke with pt and she was agreeable to see APP as Dr. [name] will not be in the office for a little while and she needs to be seen prior to 7/10.  Scheduled pt with Truitt Merle, NP on 6/22.  Pt appreciative for call.

## 2019-04-29 DIAGNOSIS — I10 Essential (primary) hypertension: Secondary | ICD-10-CM | POA: Diagnosis not present

## 2019-04-29 DIAGNOSIS — Z8673 Personal history of transient ischemic attack (TIA), and cerebral infarction without residual deficits: Secondary | ICD-10-CM | POA: Diagnosis not present

## 2019-04-29 DIAGNOSIS — M47812 Spondylosis without myelopathy or radiculopathy, cervical region: Secondary | ICD-10-CM | POA: Diagnosis not present

## 2019-04-29 DIAGNOSIS — E7800 Pure hypercholesterolemia, unspecified: Secondary | ICD-10-CM | POA: Diagnosis not present

## 2019-04-29 DIAGNOSIS — M5116 Intervertebral disc disorders with radiculopathy, lumbar region: Secondary | ICD-10-CM | POA: Diagnosis not present

## 2019-04-29 DIAGNOSIS — E785 Hyperlipidemia, unspecified: Secondary | ICD-10-CM | POA: Diagnosis not present

## 2019-04-29 DIAGNOSIS — G8194 Hemiplegia, unspecified affecting left nondominant side: Secondary | ICD-10-CM | POA: Diagnosis not present

## 2019-04-29 DIAGNOSIS — G459 Transient cerebral ischemic attack, unspecified: Secondary | ICD-10-CM | POA: Diagnosis not present

## 2019-04-29 DIAGNOSIS — I129 Hypertensive chronic kidney disease with stage 1 through stage 4 chronic kidney disease, or unspecified chronic kidney disease: Secondary | ICD-10-CM | POA: Diagnosis not present

## 2019-05-02 DIAGNOSIS — G8194 Hemiplegia, unspecified affecting left nondominant side: Secondary | ICD-10-CM | POA: Diagnosis not present

## 2019-05-02 DIAGNOSIS — G459 Transient cerebral ischemic attack, unspecified: Secondary | ICD-10-CM | POA: Diagnosis not present

## 2019-05-02 DIAGNOSIS — M47812 Spondylosis without myelopathy or radiculopathy, cervical region: Secondary | ICD-10-CM | POA: Diagnosis not present

## 2019-05-02 DIAGNOSIS — I10 Essential (primary) hypertension: Secondary | ICD-10-CM | POA: Diagnosis not present

## 2019-05-02 DIAGNOSIS — M5116 Intervertebral disc disorders with radiculopathy, lumbar region: Secondary | ICD-10-CM | POA: Diagnosis not present

## 2019-05-02 DIAGNOSIS — E785 Hyperlipidemia, unspecified: Secondary | ICD-10-CM | POA: Diagnosis not present

## 2019-05-04 DIAGNOSIS — E785 Hyperlipidemia, unspecified: Secondary | ICD-10-CM | POA: Diagnosis not present

## 2019-05-04 DIAGNOSIS — M47812 Spondylosis without myelopathy or radiculopathy, cervical region: Secondary | ICD-10-CM | POA: Diagnosis not present

## 2019-05-04 DIAGNOSIS — G459 Transient cerebral ischemic attack, unspecified: Secondary | ICD-10-CM | POA: Diagnosis not present

## 2019-05-04 DIAGNOSIS — I10 Essential (primary) hypertension: Secondary | ICD-10-CM | POA: Diagnosis not present

## 2019-05-04 DIAGNOSIS — M5116 Intervertebral disc disorders with radiculopathy, lumbar region: Secondary | ICD-10-CM | POA: Diagnosis not present

## 2019-05-04 DIAGNOSIS — G8194 Hemiplegia, unspecified affecting left nondominant side: Secondary | ICD-10-CM | POA: Diagnosis not present

## 2019-05-05 ENCOUNTER — Telehealth: Payer: Self-pay | Admitting: *Deleted

## 2019-05-05 DIAGNOSIS — I10 Essential (primary) hypertension: Secondary | ICD-10-CM | POA: Diagnosis not present

## 2019-05-05 DIAGNOSIS — E785 Hyperlipidemia, unspecified: Secondary | ICD-10-CM | POA: Diagnosis not present

## 2019-05-05 DIAGNOSIS — M5116 Intervertebral disc disorders with radiculopathy, lumbar region: Secondary | ICD-10-CM | POA: Diagnosis not present

## 2019-05-05 DIAGNOSIS — G8194 Hemiplegia, unspecified affecting left nondominant side: Secondary | ICD-10-CM | POA: Diagnosis not present

## 2019-05-05 DIAGNOSIS — M47812 Spondylosis without myelopathy or radiculopathy, cervical region: Secondary | ICD-10-CM | POA: Diagnosis not present

## 2019-05-05 DIAGNOSIS — G459 Transient cerebral ischemic attack, unspecified: Secondary | ICD-10-CM | POA: Diagnosis not present

## 2019-05-05 NOTE — Telephone Encounter (Signed)

## 2019-05-06 DIAGNOSIS — M549 Dorsalgia, unspecified: Secondary | ICD-10-CM | POA: Diagnosis not present

## 2019-05-06 NOTE — Progress Notes (Signed)
CARDIOLOGY OFFICE NOTE  Date:  05/09/2019    Tammy Allison Date of Birth: 1935/07/16 Medical Record #[id number]  PCP:  Tammy Lass, MD  Cardiologist:  Tammy Allison    Chief Complaint  Patient presents with  . Follow-up    History of Present Illness: Tammy Allison is a 84 y.o. female who presents today for a follow up visit. Seen for Dr. Tamala Allison.   She has a history of HTN, HLD, GERD and palpitations.   Seen for a telehealth visit with Dr. Tamala Allison back in April - some fatigue noted. BP was ok. She was to return prn.   She was admitted earlier this month with a TIA. She presented with transient left-sided weakness and numbness which resolved within an hour or so.  MRI is negative for acute stroke but did show multiple bilateral lacunar infarcts.  No prior history of strokes or TIAs.  CT angiogram showed no significant intracranial or extracranial stenosis.  Echo was done and EF was normal. Given aspirin/Plavix for 3 weeks then aspirin alone. Statin was started for HLD.   The patient does not have symptoms concerning for COVID-19 infection (fever, chills, cough, or new shortness of breath).   Comes in today. Here alone. She notes she just still does not feel well. She remains weak and fatigued. She also notes chest heaviness - sounds like with exertion - relieves with rest. She notes her primary concern is the fatigue - she wishes she could do more than she could previously. Not dizzy. BP running 130's at home. She is on a tapering dose of Prednisone for her back. She is bruising from the Plavix - will be finished with this later this week. She still does not feel like she needs a sleep study.   Past Medical History:  Diagnosis Date  . Arthritis   . GERD (gastroesophageal reflux disease)   . H/O hiatal hernia   . Hyperlipidemia   . Hypertension   . Hypothyroidism   . Irritable bowel syndrome   . Macular degeneration    Exudative - OD    Past Surgical History:   Procedure Laterality Date  . CATARACT EXTRACTION    . EYE SURGERY Bilateral    cataract surgery w/ lens implant  . FRACTURE SURGERY     right hip  . LUMBAR LAMINECTOMY/DECOMPRESSION MICRODISCECTOMY Left 05/25/2014   Procedure: LUMBAR LAMINECTOMY/DECOMPRESSION MICRODISCECTOMY 1 LEVEL   lumbar three;  Surgeon: Tammy Spangle, MD;  Location: [facility];  Service: Neurosurgery;  Laterality: Left;  . THYROID SURGERY       Medications: Current Meds  Medication Sig  . aspirin EC 81 MG EC tablet Take 1 tablet (81 mg total) by mouth daily.  . cetirizine (ZYRTEC) 10 MG tablet Take 5 mg by mouth at bedtime.   . cholecalciferol (VITAMIN D) 1000 UNITS tablet Take 1,000 Units by mouth every morning.   . clopidogrel (PLAVIX) 75 MG tablet Take 1 tablet (75 mg total) by mouth daily.  . hydrALAZINE (APRESOLINE) 50 MG tablet Take 1 tablet (50 mg total) by mouth 3 (three) times daily.  Marland Kitchen levothyroxine (SYNTHROID) 88 MCG tablet Take 88 mcg by mouth daily before breakfast.   . losartan (COZAAR) 100 MG tablet Take 1 tablet (100 mg total) by mouth daily.  . metoprolol tartrate (LOPRESSOR) 100 MG tablet Take 1 tablet (100 mg total) by mouth 2 (two) times daily.  . pantoprazole (PROTONIX) 40 MG tablet Take 40 mg by mouth  every morning.   . predniSONE (DELTASONE) 10 MG tablet TK TAPER DOSE OF 6-6-5-5-4-4-3-3-2-2-1-1 FOR 12 DAYS TOTAL. TAKE WITH FOOD.  . Probiotic Product (ALIGN) 4 MG CAPS Take 4 mg by mouth daily.  . rosuvastatin (CRESTOR) 5 MG tablet Take 1 tablet (5 mg total) by mouth at bedtime.     Allergies: Allergies  Allergen Reactions  . Sulfa Antibiotics Hives  . Carvedilol Rash    Social History: The patient  reports that she has never smoked. She has never used smokeless tobacco. She reports that she does not drink alcohol or use drugs.   Family History: The patient's family history includes Macular degeneration in her mother.   Review of Systems: Please see the history of present  illness.  All other systems are reviewed and negative.   Physical Exam: VS:  BP 102/60   Pulse 67   Ht 5\' 4"  (1.626 m)   Wt 134 lb 12.8 oz (61.1 kg)   SpO2 97%   BMI 23.14 kg/m  .  BMI Body mass index is 23.14 kg/m.  Wt Readings from Last 3 Encounters:  05/09/19 134 lb 12.8 oz (61.1 kg)  04/25/19 136 lb 14.5 oz (62.1 kg)  03/17/19 139 lb (63 kg)   BP by me is 150/60  General: Pleasant. Alert and in no acute distress.   HEENT: Normal.  Neck: Supple, no JVD, carotid bruits, or masses noted.  Cardiac: Regular rate and rhythm. No murmurs, rubs, or gallops. No edema.  Respiratory:  Lungs are clear to auscultation bilaterally with normal work of breathing.  GI: Soft and nontender.  MS: No deformity or atrophy. Gait and ROM intact.  Skin: Warm and dry. Color is normal.  Neuro:  Strength and sensation are intact and no gross focal deficits noted.  Psych: Alert, appropriate and with normal affect.   LABORATORY DATA:  EKG:  EKG is not ordered today.  Lab Results  Component Value Date   WBC 6.3 04/25/2019   HGB 12.6 04/25/2019   HCT 37.0 04/25/2019   PLT 214 04/25/2019   GLUCOSE 110 (H) 04/26/2019   CHOL 220 (H) 04/26/2019   TRIG 77 04/26/2019   HDL 62 04/26/2019   LDLCALC 143 (H) 04/26/2019   ALT 14 04/25/2019   AST 18 04/25/2019   NA 134 (L) 04/26/2019   K 4.0 04/26/2019   CL 99 04/26/2019   CREATININE 1.10 (H) 04/26/2019   BUN 15 04/26/2019   CO2 24 04/26/2019   TSH 0.332 (L) 05/29/2014   INR 1.0 04/25/2019   HGBA1C 5.3 04/26/2019     BNP (last 3 results) No results for input(s): BNP in the last 8760 hours.  ProBNP (last 3 results) No results for input(s): PROBNP in the last 8760 hours.   Other Studies Reviewed Today:  ECHO IMPRESSIONS 04/2019   1. The left ventricle has normal systolic function with an ejection fraction of 60-65%. The cavity size was normal. Left ventricular diastolic Doppler parameters are consistent with impaired relaxation. No  evidence of left ventricular regional wall  motion abnormalities.  2. The right ventricle has normal systolic function. The cavity was normal. There is no increase in right ventricular wall thickness.  3. Left atrial size was mildly dilated.  4. No evidence of mitral valve stenosis. No significant mitral regurgitation.  5. The aortic valve is tricuspid. Aortic valve regurgitation is trivial by color flow Doppler. No stenosis of the aortic valve.  6. There is mild dilatation of the aortic root measuring  37 mm.  7. Normal IVC size. No complete TR doppler jet so unable to estimate PA systolic pressure.  Assessment/Plan:  1. Recent TIA - old lacunar strokes noted on scans - aspirin/Plavix for 3 weeks and then aspirin solely.   2. HTN - BP is ok. No changes made today.   3. HLD - now on statin therapy  4. Fatigue - Dr. Tamala Allison had wanted her to consider sleep study - she is also endorsing exertional chest heaviness - multiple CV risk factors - will arrange for coronary CT. She is already on beta blocker therapy.   5. COVID-19 Education: The signs and symptoms of COVID-19 were discussed with the patient and how to seek care for testing (follow up with PCP or arrange E-visit).  The importance of social distancing, staying at home, hand hygiene and wearing a mask when out in public were discussed today.  Current medicines are reviewed with the patient today.  The patient does not have concerns regarding medicines other than what has been noted above.  The following changes have been made:  See above.  Labs/ tests ordered today include:    Orders Placed This Encounter  Procedures  . CT CORONARY MORPH W/CTA COR W/SCORE W/CA W/CM &/OR WO/CM  . CT CORONARY FRACTIONAL FLOW RESERVE DATA PREP  . CT CORONARY FRACTIONAL FLOW RESERVE FLUID ANALYSIS     Disposition:   Further disposition pending.   Patient is agreeable to this plan and will call if any problems develop in the interim.   SignedTruitt Merle, NP  05/09/2019 12:17 PM  [facility] [address] [place], [place]  [address] Phone: [phone] Fax: [phone]

## 2019-05-09 ENCOUNTER — Encounter: Payer: Self-pay | Admitting: Nurse Practitioner

## 2019-05-09 ENCOUNTER — Other Ambulatory Visit: Payer: Self-pay

## 2019-05-09 ENCOUNTER — Ambulatory Visit (INDEPENDENT_AMBULATORY_CARE_PROVIDER_SITE_OTHER): Payer: Medicare Other | Admitting: Nurse Practitioner

## 2019-05-09 VITALS — BP 102/60 | HR 67 | Ht 64.0 in | Wt 134.8 lb

## 2019-05-09 DIAGNOSIS — G8194 Hemiplegia, unspecified affecting left nondominant side: Secondary | ICD-10-CM | POA: Diagnosis not present

## 2019-05-09 DIAGNOSIS — I10 Essential (primary) hypertension: Secondary | ICD-10-CM

## 2019-05-09 DIAGNOSIS — R5383 Other fatigue: Secondary | ICD-10-CM | POA: Diagnosis not present

## 2019-05-09 DIAGNOSIS — M47812 Spondylosis without myelopathy or radiculopathy, cervical region: Secondary | ICD-10-CM | POA: Diagnosis not present

## 2019-05-09 DIAGNOSIS — R0789 Other chest pain: Secondary | ICD-10-CM | POA: Diagnosis not present

## 2019-05-09 DIAGNOSIS — R079 Chest pain, unspecified: Secondary | ICD-10-CM

## 2019-05-09 DIAGNOSIS — E785 Hyperlipidemia, unspecified: Secondary | ICD-10-CM

## 2019-05-09 DIAGNOSIS — G459 Transient cerebral ischemic attack, unspecified: Secondary | ICD-10-CM

## 2019-05-09 DIAGNOSIS — R072 Precordial pain: Secondary | ICD-10-CM | POA: Diagnosis not present

## 2019-05-09 DIAGNOSIS — M5116 Intervertebral disc disorders with radiculopathy, lumbar region: Secondary | ICD-10-CM | POA: Diagnosis not present

## 2019-05-09 DIAGNOSIS — U071 COVID-19: Secondary | ICD-10-CM | POA: Diagnosis not present

## 2019-05-09 NOTE — Patient Instructions (Addendum)
After Visit Summary:  We will be checking the following labs today - NONE   Medication Instructions:    Continue with your current medicines.    If you need a refill on your cardiac medications before your next appointment, please call your pharmacy.     Testing/Procedures To Be Arranged:  Coronary CT  Follow-Up:   Lets see how your test turns out and then we will decide about follow up.     At [facility], you and your health needs are our priority.  As part of our continuing mission to provide you with exceptional heart care, we have created designated Provider Care Teams.  These Care Teams include your primary Cardiologist (physician) and Advanced Practice Providers (APPs -  Physician Assistants and Nurse Practitioners) who all work together to provide you with the care you need, when you need it.  Special Instructions:  . Stay safe, stay home, wash your hands for at least 20 seconds and wear a mask when out in public.  . It was good to talk with you today.   CORONARY CT INSTRUCTIONS  Please arrive at the [facility] main entrance of [facility] at _________________________________________________ AM (30-45 minutes prior to test start time)  [facility] [place], [place] [address] [phone]  Proceed to the [facility] Radiology Department (First Floor).  Please follow these instructions carefully (unless otherwise directed):  On the Night Before the Test: . Be sure to Drink plenty of water. . Do not consume any caffeinated/decaffeinated beverages or chocolate 12 hours prior to your test. . Do not take any antihistamines 12 hours prior to your test.   On the Day of the Test: . Drink plenty of water. Do not drink any water within one hour of the test. . Do not eat any food 4 hours prior to the test. . You may take your regular medications prior to the test.  . Take your morning dose of metoprolol (Lopressor) two hours  prior to test on that day.      After the Test: . Drink plenty of water. . After receiving IV contrast, you may experience a mild flushed feeling. This is normal. . On occasion, you may experience a mild rash up to 24 hours after the test. This is not dangerous. If this occurs, you can take Benadryl 25 mg and increase your fluid intake. . If you experience trouble breathing, this can be serious. If it is severe call 911 IMMEDIATELY. If it is mild, please call our office. . If you take any of these medications: Glipizide/Metformin, Avandament, Glucavance, please do not take 48 hours after completing test.     Call the [facility] office at [phone] if you have any questions, problems or concerns.

## 2019-05-09 NOTE — Addendum Note (Signed)
Addended by: Burtis Junes on: 05/09/2019 12:51 PM   Modules accepted: Orders

## 2019-05-11 DIAGNOSIS — M5116 Intervertebral disc disorders with radiculopathy, lumbar region: Secondary | ICD-10-CM | POA: Diagnosis not present

## 2019-05-11 DIAGNOSIS — M47812 Spondylosis without myelopathy or radiculopathy, cervical region: Secondary | ICD-10-CM | POA: Diagnosis not present

## 2019-05-11 DIAGNOSIS — G8194 Hemiplegia, unspecified affecting left nondominant side: Secondary | ICD-10-CM | POA: Diagnosis not present

## 2019-05-11 DIAGNOSIS — I10 Essential (primary) hypertension: Secondary | ICD-10-CM | POA: Diagnosis not present

## 2019-05-11 DIAGNOSIS — E785 Hyperlipidemia, unspecified: Secondary | ICD-10-CM | POA: Diagnosis not present

## 2019-05-11 DIAGNOSIS — G459 Transient cerebral ischemic attack, unspecified: Secondary | ICD-10-CM | POA: Diagnosis not present

## 2019-05-12 ENCOUNTER — Other Ambulatory Visit (HOSPITAL_COMMUNITY): Payer: Self-pay | Admitting: Emergency Medicine

## 2019-05-12 ENCOUNTER — Telehealth (HOSPITAL_COMMUNITY): Payer: Self-pay | Admitting: Emergency Medicine

## 2019-05-12 DIAGNOSIS — R072 Precordial pain: Secondary | ICD-10-CM

## 2019-05-12 NOTE — Telephone Encounter (Signed)
Reaching out to patient to offer assistance regarding upcoming cardiac imaging study; pt verbalizes understanding of appt date/time, parking situation and where to check in, pre-test NPO status and medications ordered, and verified current allergies; name and call back number provided for further questions should they arise Anwita Mencer RN Navigator Cardiac Imaging  Heart and Vascular [phone] office [phone] cell  Pt denies covid symptoms, verbalized understanding of visitor policy. 

## 2019-05-13 ENCOUNTER — Other Ambulatory Visit: Payer: Self-pay

## 2019-05-13 ENCOUNTER — Ambulatory Visit (HOSPITAL_COMMUNITY)
Admission: RE | Admit: 2019-05-13 | Discharge: 2019-05-13 | Disposition: A | Discharge status: Home / Self Care | Payer: Medicare Other | Source: Ambulatory Visit | Attending: Nurse Practitioner | Admitting: Nurse Practitioner

## 2019-05-13 ENCOUNTER — Ambulatory Visit (HOSPITAL_COMMUNITY): Payer: Medicare Other

## 2019-05-13 DIAGNOSIS — G459 Transient cerebral ischemic attack, unspecified: Secondary | ICD-10-CM | POA: Insufficient documentation

## 2019-05-13 DIAGNOSIS — R072 Precordial pain: Secondary | ICD-10-CM | POA: Insufficient documentation

## 2019-05-13 DIAGNOSIS — R5383 Other fatigue: Secondary | ICD-10-CM | POA: Diagnosis not present

## 2019-05-13 DIAGNOSIS — I10 Essential (primary) hypertension: Secondary | ICD-10-CM

## 2019-05-13 DIAGNOSIS — E785 Hyperlipidemia, unspecified: Secondary | ICD-10-CM | POA: Insufficient documentation

## 2019-05-13 LAB — POCT I-STAT CREATININE: Creatinine, Ser: 1.2 mg/dL — ABNORMAL HIGH (ref 0.44–1.00)

## 2019-05-13 MED ORDER — NITROGLYCERIN 0.4 MG SL SUBL
0.8000 mg | SUBLINGUAL_TABLET | Freq: Once | SUBLINGUAL | Status: AC
  Administered 2019-05-13: 0.8 mg via SUBLINGUAL
  Filled 2019-05-13: qty 25

## 2019-05-13 MED ORDER — NITROGLYCERIN 0.4 MG SL SUBL
SUBLINGUAL_TABLET | SUBLINGUAL | Status: AC
  Filled 2019-05-13: qty 2

## 2019-05-13 MED ORDER — IOHEXOL 350 MG/ML SOLN
80.0000 mL | Freq: Once | INTRAVENOUS | Status: AC | PRN
  Administered 2019-05-13: 80 mL via INTRAVENOUS

## 2019-05-13 NOTE — Progress Notes (Signed)
CT scan completed. Tolerated well. D/C home walking awake and alert. In no distress

## 2019-05-16 ENCOUNTER — Other Ambulatory Visit: Payer: Self-pay | Admitting: *Deleted

## 2019-05-16 DIAGNOSIS — E785 Hyperlipidemia, unspecified: Secondary | ICD-10-CM

## 2019-05-16 DIAGNOSIS — I10 Essential (primary) hypertension: Secondary | ICD-10-CM | POA: Diagnosis not present

## 2019-05-16 DIAGNOSIS — G8194 Hemiplegia, unspecified affecting left nondominant side: Secondary | ICD-10-CM | POA: Diagnosis not present

## 2019-05-16 DIAGNOSIS — M47812 Spondylosis without myelopathy or radiculopathy, cervical region: Secondary | ICD-10-CM | POA: Diagnosis not present

## 2019-05-16 DIAGNOSIS — M5116 Intervertebral disc disorders with radiculopathy, lumbar region: Secondary | ICD-10-CM | POA: Diagnosis not present

## 2019-05-16 DIAGNOSIS — G459 Transient cerebral ischemic attack, unspecified: Secondary | ICD-10-CM | POA: Diagnosis not present

## 2019-05-16 NOTE — Progress Notes (Signed)
Error

## 2019-05-18 ENCOUNTER — Ambulatory Visit (INDEPENDENT_AMBULATORY_CARE_PROVIDER_SITE_OTHER): Payer: Medicare Other | Admitting: Ophthalmology

## 2019-05-18 ENCOUNTER — Other Ambulatory Visit: Payer: Self-pay

## 2019-05-18 DIAGNOSIS — H3581 Retinal edema: Secondary | ICD-10-CM | POA: Diagnosis not present

## 2019-05-18 DIAGNOSIS — H353122 Nonexudative age-related macular degeneration, left eye, intermediate dry stage: Secondary | ICD-10-CM

## 2019-05-18 DIAGNOSIS — H26493 Other secondary cataract, bilateral: Secondary | ICD-10-CM | POA: Diagnosis not present

## 2019-05-18 DIAGNOSIS — Z961 Presence of intraocular lens: Secondary | ICD-10-CM | POA: Diagnosis not present

## 2019-05-18 DIAGNOSIS — H353211 Exudative age-related macular degeneration, right eye, with active choroidal neovascularization: Secondary | ICD-10-CM

## 2019-05-18 DIAGNOSIS — H43813 Vitreous degeneration, bilateral: Secondary | ICD-10-CM | POA: Diagnosis not present

## 2019-05-18 NOTE — Progress Notes (Signed)
[facility] Note  05/18/2019     CHIEF COMPLAINT Patient presents for Retina Follow Up   HISTORY OF PRESENT ILLNESS: Tammy Allison is a 84 y.o. female who presents to the clinic today for:   HPI    Retina Follow Up    Patient presents with  Wet AMD.  In right eye.  This started 6 weeks ago.  Severity is moderate.  I, the attending physician,  performed the HPI with the patient and updated documentation appropriately.          Comments    Patient here for 6 weeks for retina follow up for exu ARMD OD. Patient had a TIA on 6.2.20 -- original day of retina f/u. Rescheduled to now. Patient states vision doing ok. Seems like a little bit blurry. No eye pain. ASA 81mg  was added and a cholesterol med was added.        Last edited by Bernarda Caffey, MD on 05/20/2019 12:29 AM. (History)    pt states she had a TIA on June 2, she states she woke up and her left side was weak and numb, she states by the time EMS got to her house she was starting to feel better  Referring physician: [name], MD [place],  [place] [address]  HISTORICAL INFORMATION:   Selected notes from the [facility] Referred by Dr. Quentin Ore for exu ARMD OD LEE: 07.01.19 Read Drivers) [BCVA: OD: 20/40+2 OS: 20/25] Ocular Hx-non-exu ARMD OS, open angle glaucoma, pseudophakia OU, PVD, corneal guttata, miosis, asteroid hyalosis, posterior capsular opacification OU PMH-HTN, high cholesterol, thyroid disease    CURRENT MEDICATIONS: No current outpatient medications on file. (Ophthalmic Drugs)   No current facility-administered medications for this visit.  (Ophthalmic Drugs)   Current Outpatient Medications (Other)  Medication Sig  . aspirin EC 81 MG EC tablet Take 1 tablet (81 mg total) by mouth daily.  . cetirizine (ZYRTEC) 10 MG tablet Take 5 mg by mouth at bedtime.   . cholecalciferol (VITAMIN D) 1000 UNITS tablet Take 1,000 Units by mouth every morning.    . clopidogrel (PLAVIX) 75 MG tablet Take 1 tablet (75 mg total) by mouth daily.  . hydrALAZINE (APRESOLINE) 50 MG tablet Take 1 tablet (50 mg total) by mouth 3 (three) times daily.  Marland Kitchen levothyroxine (SYNTHROID) 88 MCG tablet Take 88 mcg by mouth daily before breakfast.   . losartan (COZAAR) 100 MG tablet Take 1 tablet (100 mg total) by mouth daily.  . metoprolol tartrate (LOPRESSOR) 100 MG tablet Take 1 tablet (100 mg total) by mouth 2 (two) times daily.  . pantoprazole (PROTONIX) 40 MG tablet Take 40 mg by mouth every morning.   . predniSONE (DELTASONE) 10 MG tablet TK TAPER DOSE OF 6-6-5-5-4-4-3-3-2-2-1-1 FOR 12 DAYS TOTAL. TAKE WITH FOOD.  . Probiotic Product (ALIGN) 4 MG CAPS Take 4 mg by mouth daily.  . rosuvastatin (CRESTOR) 5 MG tablet Take 1 tablet (5 mg total) by mouth at bedtime.   No current facility-administered medications for this visit.  (Other)      REVIEW OF SYSTEMS: ROS    Positive for: Neurological, Eyes   Negative for: Constitutional, Gastrointestinal, Skin, Genitourinary, Musculoskeletal, HENT, Endocrine, Cardiovascular, Respiratory, Psychiatric, Allergic/Imm, Heme/Lymph   Last edited by Theodore Demark on 05/18/2019  2:22 PM. (History)       ALLERGIES Allergies  Allergen Reactions  . Sulfa Antibiotics Hives  . Carvedilol Rash    PAST  MEDICAL HISTORY Past Medical History:  Diagnosis Date  . Arthritis   . GERD (gastroesophageal reflux disease)   . H/O hiatal hernia   . Hyperlipidemia   . Hypertension   . Hypothyroidism   . Irritable bowel syndrome   . Macular degeneration    Exudative - OD   Past Surgical History:  Procedure Laterality Date  . CATARACT EXTRACTION    . EYE SURGERY Bilateral    cataract surgery w/ lens implant  . FRACTURE SURGERY     right hip  . LUMBAR LAMINECTOMY/DECOMPRESSION MICRODISCECTOMY Left 05/25/2014   Procedure: LUMBAR LAMINECTOMY/DECOMPRESSION MICRODISCECTOMY 1 LEVEL   lumbar three;  Surgeon: Hosie Spangle, MD;   Location: [facility];  Service: Neurosurgery;  Laterality: Left;  . THYROID SURGERY      FAMILY HISTORY Family History  Problem Relation Age of Onset  . Macular degeneration Mother     SOCIAL HISTORY Social History   Tobacco Use  . Smoking status: Never Smoker  . Smokeless tobacco: Never Used  Substance Use Topics  . Alcohol use: No  . Drug use: Never         OPHTHALMIC EXAM:  Base Eye Exam    Visual Acuity (Snellen - Linear)      Right Left   Dist [place] 20/50 20/30 +1   Dist ph  20/40 -2 NI       Tonometry (Tonopen, 2:17 PM)      Right Left   Pressure 12 13       Pupils      Dark Light Shape React APD   Right 3 2 Round Brisk None   Left 3 2 Round Brisk None       Visual Fields (Counting fingers)      Left Right    Full Full       Extraocular Movement      Right Left    Full, Ortho Full, Ortho       Neuro/Psych    Oriented x3: Yes   Mood/Affect: Normal       Dilation    Both eyes: 1.0% Mydriacyl, 2.5% Phenylephrine @ 2:17 PM        Slit Lamp and Fundus Exam    Slit Lamp Exam      Right Left   Lids/Lashes Dermatochalasis - upper lid Dermatochalasis - upper lid   Conjunctiva/Sclera White and quiet White and quiet   Cornea Arcus Arcus   Anterior Chamber Deep and quiet Deep and quiet   Iris Round and dilated Round and dilated   Lens PC IOL in good position with open PC PC IOL in good position with open PC   Vitreous Vitreous syneresis, Posterior vitreous detachment Vitreous syneresis, Posterior vitreous detachment       Fundus Exam      Right Left   Disc Pink and Sharp Pink and Sharp, Tilted disc   C/D Ratio 0.4 0.4   Macula Large central PEDs with persistent SRF overlying, pigment mottling and clumping, Drusen, no heme flat, Blunted foveal reflex, +PED, Drusen, Retinal pigment epithelial mottling and clumping, no heme   Vessels Vascular attenuation, Tortuous, AV crossing changes Vascular attenuation, Tortuous, AV crossing changes    Periphery Attached Attached           Refraction    Wearing Rx      Sphere Cylinder Axis Add   Right -0.75 +0.50 089 +2.50   Left -0.75 Sphere  +2.50   Type: Bifocal  IMAGING AND PROCEDURES  Imaging and Procedures for @TODAY @  OCT, Retina - OU - Both Eyes       Right Eye Quality was good. Central Foveal Thickness: 385. Progression has worsened. Findings include abnormal foveal contour, no IRF, pigment epithelial detachment, retinal drusen , subretinal fluid, outer retinal atrophy (Interval increase in SRF overlying PED).   Left Eye Quality was good. Central Foveal Thickness: 287. Progression has been stable. Findings include normal foveal contour, pigment epithelial detachment, no IRF, no SRF.   Notes *Images captured and stored on drive  Diagnosis / Impression:  OD: Exudative ARMD -- persistent SRF overlying PED slightly increased OS: Non-Exudative ARMD w/ stable PEDs  Clinical management:  See below  Abbreviations: NFP - Normal foveal profile. CME - cystoid macular edema. PED - pigment epithelial detachment. IRF - intraretinal fluid. SRF - subretinal fluid. EZ - ellipsoid zone. ERM - epiretinal membrane. ORA - outer retinal atrophy. ORT - outer retinal tubulation. SRHM - subretinal hyper-reflective material                  ASSESSMENT/PLAN:    ICD-10-CM   1. Exudative age-related macular degeneration of right eye with active choroidal neovascularization ([place])  H35.3211   2. 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 Retinal edema  H35.81 OCT, Retina - OU - Both Eyes  4. Posterior vitreous detachment of both eyes  H43.813   5. Pseudophakia of both eyes  Z96.1   6. PCO (posterior capsular opacification), bilateral  H26.493     1,3. Exudative age related macular degeneration, OD              - initial BCVA OD 20/30             - initial OCT w/ PEDs OU (OD > OS); mild SRF overlying PED OD  - initial  FA with mild CNVM OD  - S/P IVA OD #1 (07.12.19), #2 (08.12.19), #3 (09.09.19)  - S/P IVE #1 OD (10.07.19), #2 (11.04.19), #3 (12.02.19), #4 (12.30.19), #5 (01.27.20), #6 (03.09.20), #7 (05.04.20)  - today, BCVA decreased further to 20/50+2 from 20/30 and OCT shows persistent SRF overlying PEDs  - pt had a TIA on June 2 causing left sided weakness but now vision or speech changes  - discussed potential increase in risk of stroke/TIA/MI with intravitreal anti-VEGF injections  - recommend holding off on IVA for now -- consider holding for ~3 mos post TIA unless significant vision loss occurs necessitating treatment  - [place] paperwork signed and benefits investigation started on 09.09.19 -- approved for Good Days 2020  - f/u in 10 weeks, sooner PRN  2. Age related macular degeneration, non-exudative, OS  - The incidence, anatomy, and pathology of dry AMD, risk of progression, and the AREDS and AREDS 2 study including smoking risks discussed with patient  - continue amsler grid monitoring  4. PVD / vitreous syneresis OU  Discussed findings and prognosis  No RT or RD on 360 scleral depressed exam  Reviewed s/s of RT/RD  Strict return precautions for any such RT/RD signs/symptoms  5. Pseudophakia OU  - s/p CE/IOL OU  - beautiful surgeries, doing well  - monitor  6. PCO OU (OS > OD) - s/p YAG cap OU (09/2018) w/ Dr. Kathlen Mody - doing well, VA improved OU   Ophthalmic Meds Ordered this visit:  No orders of the defined types were placed in this encounter.      Return in  about 10 weeks (around 07/27/2019) for Dilated Exam, OCT, Possible Injxn.  There are no Patient Instructions on file for this visit.   Explained the diagnoses, plan, and follow up with the patient and they expressed understanding.  Patient expressed understanding of the importance of proper follow up care.   This document serves as a record of services personally performed by Gardiner Sleeper, MD, PhD. It was created on  their behalf by Ernest Mallick, OA, an ophthalmic assistant. The creation of this record is the provider's dictation and/or activities during the visit.    Electronically signed by: Ernest Mallick, OA  07.01.2020 12:34 AM    Gardiner Sleeper, M.D., Ph.D. Diseases & Surgery of the Retina and Vitreous [facility]  I have reviewed the above documentation for accuracy and completeness, and I agree with the above. Gardiner Sleeper, M.D., Ph.D. 05/20/19 12:34 AM    Abbreviations: M myopia (nearsighted); A astigmatism; H hyperopia (farsighted); P presbyopia; Mrx spectacle prescription;  CTL contact lenses; OD right eye; OS left eye; OU both eyes  XT exotropia; ET esotropia; PEK punctate epithelial keratitis; PEE punctate epithelial erosions; DES dry eye syndrome; MGD meibomian gland dysfunction; ATs artificial tears; PFAT's preservative free artificial tears; [place] nuclear sclerotic cataract; PSC posterior subcapsular cataract; ERM epi-retinal membrane; PVD posterior vitreous detachment; RD retinal detachment; DM diabetes mellitus; DR diabetic retinopathy; NPDR non-proliferative diabetic retinopathy; PDR proliferative diabetic retinopathy; CSME clinically significant macular edema; DME diabetic macular edema; dbh dot blot hemorrhages; CWS cotton wool spot; POAG primary open angle glaucoma; C/D cup-to-disc ratio; HVF humphrey visual field; GVF goldmann visual field; OCT optical coherence tomography; IOP intraocular pressure; BRVO Branch retinal vein occlusion; CRVO central retinal vein occlusion; CRAO central retinal artery occlusion; BRAO branch retinal artery occlusion; RT retinal tear; SB scleral buckle; PPV pars plana vitrectomy; VH Vitreous hemorrhage; PRP panretinal laser photocoagulation; IVK intravitreal kenalog; VMT vitreomacular traction; MH Macular hole;  NVD neovascularization of the disc; NVE neovascularization elsewhere; AREDS age related eye disease study; ARMD age related macular  degeneration; POAG primary open angle glaucoma; EBMD epithelial/anterior basement membrane dystrophy; ACIOL anterior chamber intraocular lens; IOL intraocular lens; PCIOL posterior chamber intraocular lens; Phaco/IOL phacoemulsification with intraocular lens placement; [place] photorefractive keratectomy; LASIK laser assisted in situ keratomileusis; HTN hypertension; DM diabetes mellitus; COPD chronic obstructive pulmonary disease

## 2019-05-19 DIAGNOSIS — G8194 Hemiplegia, unspecified affecting left nondominant side: Secondary | ICD-10-CM | POA: Diagnosis not present

## 2019-05-19 DIAGNOSIS — E785 Hyperlipidemia, unspecified: Secondary | ICD-10-CM | POA: Diagnosis not present

## 2019-05-19 DIAGNOSIS — G459 Transient cerebral ischemic attack, unspecified: Secondary | ICD-10-CM | POA: Diagnosis not present

## 2019-05-19 DIAGNOSIS — I10 Essential (primary) hypertension: Secondary | ICD-10-CM | POA: Diagnosis not present

## 2019-05-19 DIAGNOSIS — M5116 Intervertebral disc disorders with radiculopathy, lumbar region: Secondary | ICD-10-CM | POA: Diagnosis not present

## 2019-05-19 DIAGNOSIS — M47812 Spondylosis without myelopathy or radiculopathy, cervical region: Secondary | ICD-10-CM | POA: Diagnosis not present

## 2019-05-20 ENCOUNTER — Encounter (INDEPENDENT_AMBULATORY_CARE_PROVIDER_SITE_OTHER): Payer: Self-pay | Admitting: Ophthalmology

## 2019-05-23 DIAGNOSIS — E785 Hyperlipidemia, unspecified: Secondary | ICD-10-CM | POA: Diagnosis not present

## 2019-05-23 DIAGNOSIS — M5116 Intervertebral disc disorders with radiculopathy, lumbar region: Secondary | ICD-10-CM | POA: Diagnosis not present

## 2019-05-23 DIAGNOSIS — M47812 Spondylosis without myelopathy or radiculopathy, cervical region: Secondary | ICD-10-CM | POA: Diagnosis not present

## 2019-05-23 DIAGNOSIS — I10 Essential (primary) hypertension: Secondary | ICD-10-CM | POA: Diagnosis not present

## 2019-05-23 DIAGNOSIS — G8194 Hemiplegia, unspecified affecting left nondominant side: Secondary | ICD-10-CM | POA: Diagnosis not present

## 2019-05-23 DIAGNOSIS — G459 Transient cerebral ischemic attack, unspecified: Secondary | ICD-10-CM | POA: Diagnosis not present

## 2019-05-26 DIAGNOSIS — E785 Hyperlipidemia, unspecified: Secondary | ICD-10-CM | POA: Diagnosis not present

## 2019-05-26 DIAGNOSIS — M47812 Spondylosis without myelopathy or radiculopathy, cervical region: Secondary | ICD-10-CM | POA: Diagnosis not present

## 2019-05-26 DIAGNOSIS — M5116 Intervertebral disc disorders with radiculopathy, lumbar region: Secondary | ICD-10-CM | POA: Diagnosis not present

## 2019-05-26 DIAGNOSIS — G459 Transient cerebral ischemic attack, unspecified: Secondary | ICD-10-CM | POA: Diagnosis not present

## 2019-05-26 DIAGNOSIS — G8194 Hemiplegia, unspecified affecting left nondominant side: Secondary | ICD-10-CM | POA: Diagnosis not present

## 2019-05-26 DIAGNOSIS — I10 Essential (primary) hypertension: Secondary | ICD-10-CM | POA: Diagnosis not present

## 2019-05-27 DIAGNOSIS — I129 Hypertensive chronic kidney disease with stage 1 through stage 4 chronic kidney disease, or unspecified chronic kidney disease: Secondary | ICD-10-CM | POA: Diagnosis not present

## 2019-05-27 DIAGNOSIS — R5383 Other fatigue: Secondary | ICD-10-CM | POA: Diagnosis not present

## 2019-06-02 ENCOUNTER — Ambulatory Visit (INDEPENDENT_AMBULATORY_CARE_PROVIDER_SITE_OTHER): Payer: Medicare Other | Admitting: Adult Health

## 2019-06-02 ENCOUNTER — Encounter: Payer: Self-pay | Admitting: Adult Health

## 2019-06-02 ENCOUNTER — Telehealth: Payer: Self-pay | Admitting: Adult Health

## 2019-06-02 ENCOUNTER — Other Ambulatory Visit: Payer: Self-pay

## 2019-06-02 VITALS — BP 137/64 | HR 73 | Temp 97.7°F | Ht 64.0 in | Wt 133.8 lb

## 2019-06-02 DIAGNOSIS — G8194 Hemiplegia, unspecified affecting left nondominant side: Secondary | ICD-10-CM | POA: Diagnosis not present

## 2019-06-02 DIAGNOSIS — G510 Bell's palsy: Secondary | ICD-10-CM

## 2019-06-02 DIAGNOSIS — R29818 Other symptoms and signs involving the nervous system: Secondary | ICD-10-CM

## 2019-06-02 DIAGNOSIS — I10 Essential (primary) hypertension: Secondary | ICD-10-CM | POA: Diagnosis not present

## 2019-06-02 DIAGNOSIS — G459 Transient cerebral ischemic attack, unspecified: Secondary | ICD-10-CM

## 2019-06-02 DIAGNOSIS — E785 Hyperlipidemia, unspecified: Secondary | ICD-10-CM

## 2019-06-02 NOTE — Patient Instructions (Signed)
Continue aspirin 81 mg daily  and Crestor  for secondary stroke prevention  Continue to follow up with PCP regarding cholesterol and blood pressure management   We will check CT head due to left sided weakness - you will be called to schedule study  Referral placed for sleep apnea evaluation - you will be called to schedule appointment  Continue to monitor blood pressure at home  Maintain strict control of hypertension with blood pressure goal below 130/90, diabetes with hemoglobin A1c goal below 6.5% and cholesterol with LDL cholesterol (bad cholesterol) goal below 70 mg/dL. I also advised the patient to eat a healthy diet with plenty of whole grains, cereals, fruits and vegetables, exercise regularly and maintain ideal body weight.  Followup in the future with me in 2 months or call earlier if needed       Thank you for coming to see [place] at [facility]. I hope we have been able to provide you high quality care today.  You may receive a patient satisfaction survey over the next few weeks. We would appreciate your feedback and comments so that we may continue to improve ourselves and the health of our patients.

## 2019-06-02 NOTE — Progress Notes (Signed)
[facility] [address] [place]. [place] [address] [phone]       HOSPITAL FOLLOW UP NOTE  [name] Date of Birth:  July 26, 1935 Medical Record Number:  [id number]   Reason for Referral:  hospital stroke follow up    CHIEF COMPLAINT:  Chief Complaint  Patient presents with  . Hospitalization Follow-up    [name](daughter) present. Rm 9. Patient is concerned that she may have had another TIA this past Tuesday morning.  She also completed PT last week     HPI: Tammy Allison who is being seen today for initial in office hospital follow-up regarding right brain TIA secondary to small vessel disease on 04/25/2019. History obtained from patient, daughter and chart review. Reviewed all radiology images and labs personally.  Tammy Allison is a 84 y.o. female with history of HTN and HLD  who presented to [facility] ED on 04/25/2019 with L sided weakness and numbness.  CT head negative for acute abnormality but did show old basal ganglia infarcts.  CTA head/neck showed left P2 high-grade stenosis but no evidence of LVO.  MRI negative for acute abnormalities but again showed evidence of dominant basal ganglia and hemispheric white matter ischemic changes.  2D echo unremarkable.  LDL 143 and A1c 5.3.  Initiated DAPT for 3 weeks and aspirin alone.  HTN stable.  Initiated Crestor as she declined atorvastatin for HLD management.  Other stroke risk factors include advanced age and prior history of stroke on imaging.  Stable from neurological standpoint until 2 days ago when she was out shopping with her daughter and she started to a pulling sensation towards her right.  She denies any weakness, dizziness or lightheadedness sensation at that time.  She was able to continue walking and attempts to find a seat but ended up leaning on a counter until her daughter came to help her.  She was able to ambulate independently back into the car out from a store and sensation resolved  after approximately 20 minutes.  She denies any associated neurological symptoms.  She also endorses generalized fatigue/weakness feeling which has been present over the past 6 to 12 months and believes this is due to continued change in blood pressure medications by cardiology.  She continues to be followed by Dr. [name] who has attempted to encourage her to undergo sleep study but previously declined as she felt as though symptoms were due to medications.  She is willing at this time to undergo sleep study at this time.  She does endorse nighttime snoring, insomnia and excessive daytime fatigue.  Blood pressure has been stable with today's reading 137/64.  She continues on Crestor without myalgias.  Completed 3 weeks DAPT and continues on aspirin alone without bleeding or bruising.  She was initially doing home health therapy which has since been discontinued.  She continues to live with her husband and is able to maintain ADLs and IADLs independently.  No further concerns at this time.  *Upon evaluation/assessment, left hemiparesis noted along with left facial droop.  Upon further questioning, she does endorse noticing slight difficulty with raising left leg such as when she puts pants on.  She denies difficulty using left arm/hand.  She is right-hand dominant.  She has been experiencing increasing mouth breathing at night and will wake up with a dry mouth which she has not experienced previously.  She denies numbness/tingling or speech difficulty.     ROS:   14 system review of systems performed  and negative with exception of weakness, fatigue, snoring  PMH:  Past Medical History:  Diagnosis Date  . Arthritis   . GERD (gastroesophageal reflux disease)   . H/O hiatal hernia   . Hyperlipidemia   . Hypertension   . Hypothyroidism   . Irritable bowel syndrome   . Macular degeneration    Exudative - OD    PSH:  Past Surgical History:  Procedure Laterality Date  . CATARACT EXTRACTION    .  EYE SURGERY Bilateral    cataract surgery w/ lens implant  . FRACTURE SURGERY     right hip  . LUMBAR LAMINECTOMY/DECOMPRESSION MICRODISCECTOMY Left 05/25/2014   Procedure: LUMBAR LAMINECTOMY/DECOMPRESSION MICRODISCECTOMY 1 LEVEL   lumbar three;  Surgeon: Hosie Spangle, MD;  Location: [facility];  Service: Neurosurgery;  Laterality: Left;  . THYROID SURGERY      Social History:  Social History   Socioeconomic History  . Marital status: Married    Spouse name: Not on file  . Number of children: Not on file  . Years of education: Not on file  . Highest education level: Not on file  Occupational History  . Not on file  Social Needs  . Financial resource strain: Not on file  . Food insecurity    Worry: Not on file    Inability: Not on file  . Transportation needs    Medical: Not on file    Non-medical: Not on file  Tobacco Use  . Smoking status: Never Smoker  . Smokeless tobacco: Never Used  Substance and Sexual Activity  . Alcohol use: No  . Drug use: Never  . Sexual activity: Not on file  Lifestyle  . Physical activity    Days per week: Not on file    Minutes per session: Not on file  . Stress: Not on file  Relationships  . Social Herbalist on phone: Not on file    Gets together: Not on file    Attends religious service: Not on file    Active member of club or organization: Not on file    Attends meetings of clubs or organizations: Not on file    Relationship status: Not on file  . Intimate partner violence    Fear of current or ex partner: Not on file    Emotionally abused: Not on file    Physically abused: Not on file    Forced sexual activity: Not on file  Other Topics Concern  . Not on file  Social History Narrative  . Not on file    Family History:  Family History  Problem Relation Age of Onset  . Macular degeneration Mother     Medications:   Current Outpatient Medications on File Prior to Visit  Medication Sig Dispense Refill  .  aspirin EC 81 MG EC tablet Take 1 tablet (81 mg total) by mouth daily.    . cetirizine (ZYRTEC) 10 MG tablet Take 5 mg by mouth at bedtime.     . cholecalciferol (VITAMIN D) 1000 UNITS tablet Take 1,000 Units by mouth every morning.     . hydrALAZINE (APRESOLINE) 50 MG tablet Take 1 tablet (50 mg total) by mouth 3 (three) times daily. 270 tablet 3  . levothyroxine (SYNTHROID) 75 MCG tablet Take 75 mcg by mouth daily before breakfast.    . losartan (COZAAR) 100 MG tablet Take 1 tablet (100 mg total) by mouth daily.    . metoprolol tartrate (LOPRESSOR) 50 MG tablet  Take 50 mg by mouth 2 (two) times daily.    . pantoprazole (PROTONIX) 40 MG tablet Take 40 mg by mouth every morning.     . predniSONE (DELTASONE) 10 MG tablet TK TAPER DOSE OF 6-6-5-5-4-4-3-3-2-2-1-1 FOR 12 DAYS TOTAL. TAKE WITH FOOD.    . Probiotic Product (ALIGN) 4 MG CAPS Take 4 mg by mouth daily.    . rosuvastatin (CRESTOR) 5 MG tablet Take 1 tablet (5 mg total) by mouth at bedtime. 30 tablet 0   No current facility-administered medications on file prior to visit.     Allergies:   Allergies  Allergen Reactions  . Sulfa Antibiotics Hives  . Carvedilol Rash     Physical Exam  Vitals:   06/02/19 0941  BP: 137/64  Pulse: 73  Temp: 97.7 F (36.5 C)  TempSrc: Oral  Weight: 133 lb 12.8 oz (60.7 kg)  Height: 5\' 4"  (1.626 m)   Body mass index is 22.97 kg/m. No exam data present  No flowsheet data found.   General: well developed, well nourished,  pleasant elderly Caucasian female, seated, in no evident distress Head: head normocephalic and atraumatic.   Neck: supple with no carotid or supraclavicular bruits Cardiovascular: regular rate and rhythm, no murmurs Musculoskeletal: no deformity Skin:  no rash/petichiae Vascular:  Normal pulses all extremities   Neurologic Exam Mental Status: Awake and fully alert.   Speech normal.  Oriented to place and time. Recent and remote memory intact. Attention span,  concentration and fund of knowledge appropriate. Mood and affect appropriate.  Cranial Nerves: Fundoscopic exam reveals sharp disc margins. Pupils equal, briskly reactive to light. Extraocular movements full without nystagmus. Visual fields full to confrontation. Hearing intact. Facial sensation intact.  Left lower facial paralysis. Motor: Normal bulk and tone.  LUE: 4/5; LLE: 4/5; full strength in right upper and lower extremity Sensory.: intact to touch , pinprick , position and vibratory sensation.  Coordination: Rapid alternating movements normal in all extremities except mildly decreased in left hand. Finger-to-nose performed accurately bilaterally and heel-to-shin performed accurately with right leg but unable to perform with left leg. Orbits right arm over left arm Gait and Station: Arises from chair without difficulty. Stance is normal. Gait demonstrates normal stride length with slight favoring of left leg.  Difficulty raising on toes and going back on heels with left leg.  Unable to perform tandem gait Reflexes: 1+ and symmetric. Toes downgoing.     NIHSS  2 Modified Rankin  1   Diagnostic Data (Labs, Imaging, Testing)   CTA head & neck no acute abnormality.  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  L P2 high-grade stenosis.  No LVO.  Mild small vessel disease throughout COW.  Beaded cervical B ICAs.  MRI no acute stroke.  Extensive small vessel disease with thalamic basal ganglia and hemispheric white matter ischemic changes  2D Echo EF 60-65%. No source of embolus   LDL 143  HgbA1c 5.3   ASSESSMENT: CARTER KAMAN is a 84 y.o. year old female who presented to [facility] ED on 04/25/2019 with left-sided weakness and numbness with diagnosis of right brain TIA secondary to small vessel disease.  Vascular risk factors include HTN, HLD and prior stroke on imaging.  She feels as though she has been stable from a stroke standpoint with recent episode of balance difficulty with pulling towards right  side lasting approximately 20 minutes.  Also has concerns of excessive daytime fatigue.  Upon assessment, evidence of left hemiparesis and left facial droop.  PLAN:  1. TIA: Continue aspirin 81 mg daily  and Crestor for secondary stroke prevention. Maintain strict control of hypertension with blood pressure goal below 130/90, diabetes with hemoglobin A1c goal below 6.5% and cholesterol with LDL cholesterol (bad cholesterol) goal below 70 mg/dL.  I also advised the patient to eat a healthy diet with plenty of whole grains, cereals, fruits and vegetables, exercise regularly with at least 30 minutes of continuous activity daily and maintain ideal body weight. 2. Acute/chronic neurological deficit: Left hemiparesis and left facial droop -she has been experiencing difficulty raising left leg since shortly after hospitalization and denies worsening.  Will obtain CT head wo contrast for evaluation of potential acute stroke.  Denies interested in additional therapies and prefers to continue to do HEP 3. Suspect sleep apnea: Long discussion regarding risks of untreated sleep apnea and patient agreed with evaluation of potential underlying sleep disorder.  Referral placed to [place] sleep clinic for further evaluation.  If sleep study needed, she requests HST 4. HTN: Advised to continue current treatment regimen.  Today's BP 137/64.  Advised to continue to monitor at home along with continued follow-up with cardiology 5. HLD: Advised to continue current treatment regimen along with continued follow-up with PCP for future prescribing and monitoring of lipid panel    Follow up in 2 months or call earlier if needed   Greater than 50% of time during this 45 minute visit was spent on counseling, explanation of diagnosis of TIA, reviewing risk factor management of HTN, HLD and suspected sleep apnea, discussion regarding importance of sleep evaluation, discussion regarding left-sided weakness and facial droop,  planning of further management along with potential future management, and discussion with patient and family answering all questions.    Venancio Poisson, AGNP-BC  [facility] [address] [place], [place] [address]  Phone [phone] Fax [phone] Note: This document was prepared with digital dictation and possible smart phrase technology. Any transcriptional errors that result from this process are unintentional.

## 2019-06-02 NOTE — Telephone Encounter (Signed)
Medicare/aarp order sent to GI. No auth they will reach out to the patient to schedule.  

## 2019-06-03 NOTE — Progress Notes (Signed)
I agree with the above plan 

## 2019-06-08 ENCOUNTER — Other Ambulatory Visit: Payer: Self-pay

## 2019-06-08 ENCOUNTER — Ambulatory Visit (INDEPENDENT_AMBULATORY_CARE_PROVIDER_SITE_OTHER): Payer: Medicare Other | Admitting: Neurology

## 2019-06-08 ENCOUNTER — Encounter: Payer: Self-pay | Admitting: Neurology

## 2019-06-08 VITALS — BP 138/76 | HR 75 | Temp 97.8°F | Ht 64.0 in | Wt 134.0 lb

## 2019-06-08 DIAGNOSIS — E871 Hypo-osmolality and hyponatremia: Secondary | ICD-10-CM | POA: Diagnosis not present

## 2019-06-08 DIAGNOSIS — E876 Hypokalemia: Secondary | ICD-10-CM | POA: Diagnosis not present

## 2019-06-08 DIAGNOSIS — E878 Other disorders of electrolyte and fluid balance, not elsewhere classified: Secondary | ICD-10-CM

## 2019-06-08 DIAGNOSIS — R0683 Snoring: Secondary | ICD-10-CM

## 2019-06-08 DIAGNOSIS — I69354 Hemiplegia and hemiparesis following cerebral infarction affecting left non-dominant side: Secondary | ICD-10-CM | POA: Diagnosis not present

## 2019-06-08 DIAGNOSIS — R531 Weakness: Secondary | ICD-10-CM | POA: Diagnosis not present

## 2019-06-08 NOTE — Patient Instructions (Signed)

## 2019-06-08 NOTE — Progress Notes (Signed)
SLEEP MEDICINE CLINIC    Provider:  Larey Seat, MD  Primary Care Physician:  [name], MD [place] [address]     Referring Provider: [name], [place],  [place] [address]          Chief Complaint according to patient   Patient presents with:     New Patient (Initial Visit)           HISTORY OF PRESENT ILLNESS:  Tammy Allison is a 84 y.o. year old White or Caucasian female patient seen here as a referral on 06/08/2019 from Dr Leonie Man. Chief concern according to patient : " I am so weak I can hardly function"    I have the pleasure of seeing Tammy Allison today, a right-handed White or Caucasian female with a possible sleep disorder.  She has a  has a past medical history of Arthritis, GERD (gastroesophageal reflux disease), H/O hiatal hernia, Hyperlipidemia, Hypertension, Hypothyroidism, Irritable bowel syndrome, and Macular degeneration.   The patient reportedly had a TIA 5 weeks prior and was seen by stroke MD in follow up. She noted upon waking up that her left arm, hand and leg didn't work. She called her husband who helped her to sit up. She slipped of the bed and fell. Feeling returned ot the left side before strength returned. .   She reported that her fatigue began about 12 month ago with attempts to control her BP, and several medications were used. She is currently on 3 medications for HTN control.    The patient was seen by Dr Leonie Man- Stroke MD and Venancio Poisson on 06-02-2019: HPI: Tammy Allison who is being seen today for initial in office hospital follow-up regarding right brain TIA secondary to small vessel disease on 04/25/2019. History obtained from patient, daughter and chart review. Reviewed all radiology images and labs personally.  [name] is a 84 y.o. female with history of HTN and HLD  who presented to [facility] ED on 04/25/2019 with L sided weakness and numbness.  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  CTA head/neck showed left P2 high-grade stenosis but no evidence of LVO.  MRI negative for acute abnormalities but again showed evidence of dominant basal ganglia and hemispheric white matter ischemic changes.  2D echo unremarkable.  LDL 143 and A1c 5.3.  Initiated DAPT for 3 weeks and aspirin alone.  HTN stable.  Initiated Crestor as she declined atorvastatin for HLD management.  Other stroke risk factors include advanced age and prior history of stroke on imaging.  Stable from neurological standpoint until 2 days ago when she was out shopping with her daughter and she started to a pulling sensation towards her right.  She denies any weakness, dizziness or lightheadedness sensation at that time.  She was able to continue walking and attempts to find a seat but ended up leaning on a counter until her daughter came to help her.  She was able to ambulate independently back into the car out from a store and sensation resolved after approximately 20 minutes.  She denies any associated neurological symptoms.  She also endorses generalized fatigue/weakness feeling which has been present over the past 6 to 12 months and believes this is due to continued change in blood pressure medications by cardiology.  She continues to be followed by Dr. [name] who has attempted to encourage her to undergo sleep  study but previously declined as she felt as though symptoms were due to medications.  She is willing at this time to undergo sleep study at this time.  She does endorse nighttime snoring, insomnia and excessive daytime fatigue.  Blood pressure has been stable with today's reading 137/64.  She continues on Crestor without myalgias.  Completed 3 weeks DAPT and continues on aspirin alone without bleeding or bruising.  She was initially doing home health therapy which has since been discontinued.  She continues to live with her husband and is able to maintain ADLs and  IADLs independently.  No further concerns at this time.  *Upon evaluation/assessment, left hemiparesis noted along with left facial droop.  Upon further questioning, she does endorse noticing slight difficulty with raising left leg such as when she puts pants on.  She denies difficulty using left arm/hand.  She is right-hand dominant.  She has been experiencing increasing mouth breathing at night and will wake up with a dry mouth which she has not experienced previously.  She denies numbness/tingling or speech difficulty.      Family medical /sleep history:  No other family member on CPAP with OSA, insomnia, sleep walkers.    Social history:  Patient is retired from [employer] and lives in a household with 2 persons.  Family status is married  with 2 adult children, 4 grandchildren.  Pets are not present. Tobacco use; none .  ETOH use ; none ,  Caffeine intake in form of Coffee( 1 cup in AM) Soda( rare ) Tea ( 1/ day) or energy drinks. No regular exercise in form of walking since the TIA. Used to walk a mile a day.    Hobbies : " My grandchilldren"    Sleep habits are as follows: The patient's dinner time is between 6 PM. The patient goes to bed at 9.30 PM and continues to watch TV - on a timer 1 hour. She goes easily to sleep for 4-5 hours, wakes for 2 bathroom breaks, the first time at 2 AM.   The preferred sleep position is lateral , with the support of 2 pillows. Flat bed. Dreams are reportedly vivid, but not nightmares.    8 AM is the usual rise time. The patient wakes up at 6 AM with an alarm.  She reports not feeling refreshed or restored in AM, with symptoms such as dry mouth, but no headaches- just residual fatigue.  Naps are taken frequently, can be 11 AM and lasting from 30-40 minutes and are more refreshing than nocturnal sleep. She naps in a recliner.    Review of Systems: Out of a complete 14 system review, the patient complains of only the following symptoms, and all other  reviewed systems are negative.:  Fatigue, sleepiness , snoring loudly - husbands observed no apnea, fragmented sleep,  Insomnia some night.    How likely are you to doze in the following situations: 0 = not likely, 1 = slight chance, 2 = moderate chance, 3 = high chance   Sitting and Reading? Watching Television? Sitting inactive in a public place (theater or meeting)? As a passenger in a car for an hour without a break? Lying down in the afternoon when circumstances permit? Sitting and talking to someone? Sitting quietly after lunch without alcohol? In a car, while stopped for a few minutes in traffic?   Total = 6/ 24 points   FSS endorsed at 54/ 63 points. Very high.    Social History   Socioeconomic  History   Marital status: Married    Spouse name: Not on file   Number of children: Not on file   Years of education: Not on file   Highest education level: Not on file  Occupational History   Not on file  Social Needs   Financial resource strain: Not on file   Food insecurity    Worry: Not on file    Inability: Not on file   Transportation needs    Medical: Not on file    Non-medical: Not on file  Tobacco Use   Smoking status: Never Smoker   Smokeless tobacco: Never Used  Substance and Sexual Activity   Alcohol use: No   Drug use: Never   Sexual activity: Not on file  Lifestyle   Physical activity    Days per week: Not on file    Minutes per session: Not on file   Stress: Not on file  Relationships   Social connections    Talks on phone: Not on file    Gets together: Not on file    Attends religious service: Not on file    Active member of club or organization: Not on file    Attends meetings of clubs or organizations: Not on file    Relationship status: Not on file  Other Topics Concern   Not on file  Social History Narrative   Not on file    Family History  Problem Relation Age of Onset   Macular degeneration Mother     Past  Medical History:  Diagnosis Date   Arthritis    GERD (gastroesophageal reflux disease)    H/O hiatal hernia    Hyperlipidemia    Hypertension    Hypothyroidism    Irritable bowel syndrome    Macular degeneration    Exudative - OD    Past Surgical History:  Procedure Laterality Date   CATARACT EXTRACTION     EYE SURGERY Bilateral    cataract surgery w/ lens implant   FRACTURE SURGERY     right hip   LUMBAR LAMINECTOMY/DECOMPRESSION MICRODISCECTOMY Left 05/25/2014   Procedure: LUMBAR LAMINECTOMY/DECOMPRESSION MICRODISCECTOMY 1 LEVEL   lumbar three;  Surgeon: Hosie Spangle, MD;  Location: [facility];  Service: Neurosurgery;  Laterality: Left;   THYROID SURGERY       Current Outpatient Medications on File Prior to Visit  Medication Sig Dispense Refill   aspirin EC 81 MG EC tablet Take 1 tablet (81 mg total) by mouth daily.     cetirizine (ZYRTEC) 10 MG tablet Take 5 mg by mouth at bedtime.      cholecalciferol (VITAMIN D) 1000 UNITS tablet Take 1,000 Units by mouth every morning.      hydrALAZINE (APRESOLINE) 50 MG tablet Take 1 tablet (50 mg total) by mouth 3 (three) times daily. 270 tablet 3   levothyroxine (SYNTHROID) 75 MCG tablet Take 75 mcg by mouth daily before breakfast.     losartan (COZAAR) 100 MG tablet Take 1 tablet (100 mg total) by mouth daily.     metoprolol tartrate (LOPRESSOR) 50 MG tablet Take 50 mg by mouth 2 (two) times daily.     pantoprazole (PROTONIX) 40 MG tablet Take 40 mg by mouth every morning.      Probiotic Product (ALIGN) 4 MG CAPS Take 4 mg by mouth daily.     rosuvastatin (CRESTOR) 5 MG tablet Take 1 tablet (5 mg total) by mouth at bedtime. 30 tablet 0   No current facility-administered  medications on file prior to visit.     Allergies  Allergen Reactions   Sulfa Antibiotics Hives   Carvedilol Rash    Physical exam:  Today's Vitals   06/08/19 0958  BP: 138/76  Pulse: 75  Temp: 97.8 F (36.6 C)  Weight: 134  lb (60.8 kg)  Height: 5\' 4"  (1.626 m)   Body mass index is 23 kg/m.   Wt Readings from Last 3 Encounters:  06/08/19 134 lb (60.8 kg)  06/02/19 133 lb 12.8 oz (60.7 kg)  05/09/19 134 lb 12.8 oz (61.1 kg)     Ht Readings from Last 3 Encounters:  06/08/19 5\' 4"  (1.626 m)  06/02/19 5\' 4"  (1.626 m)  05/09/19 5\' 4"  (1.626 m)      General: The patient is awake, alert and appears not in acute distress. The patient is well groomed. Head: Normocephalic, atraumatic. Neck is supple. Mallampati 4- not deviated. ,  neck circumference: 14  inches . Nasal airflow  patent.  Retrognathia is mildly seen.  Dental status:   Cardiovascular:  Regular rate and cardiac rhythm by pulse,  without distended neck veins. Respiratory: Lungs are clear to auscultation.  Skin:  Without evidence of ankle edema, or rash. Trunk: The patient's posture is stooped . The patient is awake and alert, oriented to place and time.   Memory subjective described as intact.  Attention span & concentration ability appears normal.  Speech is fluent,  with longstanding dysphonia .  Mood and affect are depessed   Cranial nerves: no loss of smell or taste reported  Pupils are equal and briskly reactive to light. Funduscopic exam deferred.   Extraocular movements in vertical and horizontal planes were intact and without nystagmus. No Diplopia. Visual fields by finger perimetry are intact. Hearing was intact to soft voice and finger rubbing.    Facial sensation intact to fine touch.  Facial motor strength is symmetric and tongue and uvula move midline.  Neck ROM : rotation, tilt and flexion extension were normal for age and shoulder shrug was symmetrical.    Motor exam:  Symmetric bulk, tone and ROM.  Left sided strength 4/5 right is full, pronatordrift on the left.   Normal tone without cog wheeling, weaker left  grip strength .   Sensory:  Fine touch, pinprick and vibration were tested  and  normal.  Proprioception tested in  the upper extremities was normal.   Coordination: reports her handwriting "has gotten horrible".  Rapid alternating movements in the fingers/hands were clumsy on the left, slowed.  Toe and heel walk were deferred.  Babinski response was deferred.      After spending a total time of  40  minutes face to face and additional time for physical and neurologic examination, review of laboratory studies,  personal review of imaging studies, reports and results of other testing and review of referral information / records as far as provided in visit, I have established the following assessments:  1)   I have the pleasure of meeting today [name], meanwhile 84 year old Caucasian right-handed female who must have suffered a stroke and not just a TIA as she has significant left-sided residual symptoms.  The origin or cause of the stroke is unknown at this time.  Echocardiogram and cardiac monitor did not give evidence of atrial fibrillation or source of embolism.  Her fatigue and sleepiness at very little to do with the stroke and seems to have preceded the cerebrovascular event by at least a  year.  She noted becoming increasingly fatigued and drained of energy by her physicians try to control her blood pressure with various medications.  She also has noted that her handwriting has changed and she is right-hand dominant but should not have gotten this way if only her left body was affected.  The increasing fatigue is much more prominent than true excessive sleepiness.  One heart is of course medication side effects, fatigue can also be related to depression and it is often a part of autoimmune or rheumatological diseases.  Chronic pain will also lead to significant fatigue.  In order to rule out a sleep disorder I will invite the patient for an attended sleep study based on her fairly recent cerebrovascular incident.  This would entail a overnight stay at the sleep lab.  Arriving either at 8 or 9 PM and  the studies usually ends between 5 and 6 AM.  If the patient is used to taking any sleep aids I will ask her to bring them with her to the lab but not take them before she has informed the sleep tach.   The patient would prefer a HST - I will order both tests..    My Plan is to proceed with I would like to thank Dr Leonie Man, MD  for allowing me to meet with and to take care of this pleasant patient.   I plan to follow up either personally or through our NP within 2 month if the HST/ PSG is positive .   CC: I will share my notes with PCP.  Electronically signed by: Larey Seat, MD 06/08/2019 10:23 AM  [facility] Board certified by The [employer] of Sleep Medicine and Diplomate of the [employer] of Sleep Medicine. Board certified In Neurology through the [place], Fellow of the [employer] of Neurology. Medical Director of [facility].

## 2019-06-13 ENCOUNTER — Ambulatory Visit
Admission: RE | Admit: 2019-06-13 | Discharge: 2019-06-13 | Disposition: A | Discharge status: Home / Self Care | Payer: Medicare Other | Source: Ambulatory Visit | Attending: Adult Health | Admitting: Adult Health

## 2019-06-13 ENCOUNTER — Other Ambulatory Visit: Payer: Self-pay

## 2019-06-13 DIAGNOSIS — R29818 Other symptoms and signs involving the nervous system: Secondary | ICD-10-CM | POA: Diagnosis not present

## 2019-06-14 ENCOUNTER — Telehealth: Payer: Self-pay | Admitting: *Deleted

## 2019-06-14 NOTE — Telephone Encounter (Signed)
-----   Message from Venancio Poisson, NP sent at 06/14/2019  8:33 AM EDT ----- Please notify patient that her recent CT head did not show any acute strokes or findings.  It did continue to show old strokes that were also present on imaging while she was in the hospital.  No indication at this time for further imaging.

## 2019-06-14 NOTE — Telephone Encounter (Signed)
I called pt and relayed that her CT head showed no acute finding or new strokes.  Did show old stroke findings per whne she was in the hospital. No further imaging needed at this time. She verbalized understanding.

## 2019-06-15 ENCOUNTER — Other Ambulatory Visit: Payer: Self-pay

## 2019-06-15 ENCOUNTER — Other Ambulatory Visit: Payer: Medicare Other

## 2019-06-15 DIAGNOSIS — E785 Hyperlipidemia, unspecified: Secondary | ICD-10-CM

## 2019-06-16 LAB — HEPATIC FUNCTION PANEL
ALT: 10 IU/L (ref 0–32)
AST: 13 IU/L (ref 0–40)
Albumin: 3.9 g/dL (ref 3.6–4.6)
Alkaline Phosphatase: 50 IU/L (ref 39–117)
Bilirubin Total: 0.6 mg/dL (ref 0.0–1.2)
Bilirubin, Direct: 0.21 mg/dL (ref 0.00–0.40)
Total Protein: 5.7 g/dL — ABNORMAL LOW (ref 6.0–8.5)

## 2019-06-16 LAB — LIPID PANEL
Chol/HDL Ratio: 1.8 ratio (ref 0.0–4.4)
Cholesterol, Total: 116 mg/dL (ref 100–199)
HDL: 65 mg/dL (ref >39)
LDL Calculated: 37 mg/dL (ref 0–99)
Triglycerides: 69 mg/dL (ref 0–149)
VLDL Cholesterol Cal: 14 mg/dL (ref 5–40)

## 2019-06-24 ENCOUNTER — Ambulatory Visit (INDEPENDENT_AMBULATORY_CARE_PROVIDER_SITE_OTHER): Payer: Medicare Other | Admitting: Neurology

## 2019-06-24 DIAGNOSIS — G4761 Periodic limb movement disorder: Secondary | ICD-10-CM

## 2019-06-24 DIAGNOSIS — E876 Hypokalemia: Secondary | ICD-10-CM

## 2019-06-24 DIAGNOSIS — I129 Hypertensive chronic kidney disease with stage 1 through stage 4 chronic kidney disease, or unspecified chronic kidney disease: Secondary | ICD-10-CM | POA: Diagnosis not present

## 2019-06-24 DIAGNOSIS — I69354 Hemiplegia and hemiparesis following cerebral infarction affecting left non-dominant side: Secondary | ICD-10-CM

## 2019-06-24 DIAGNOSIS — R531 Weakness: Secondary | ICD-10-CM

## 2019-06-24 DIAGNOSIS — E878 Other disorders of electrolyte and fluid balance, not elsewhere classified: Secondary | ICD-10-CM

## 2019-06-24 DIAGNOSIS — E871 Hypo-osmolality and hyponatremia: Secondary | ICD-10-CM

## 2019-06-24 DIAGNOSIS — Z6823 Body mass index (BMI) 23.0-23.9, adult: Secondary | ICD-10-CM | POA: Diagnosis not present

## 2019-06-24 DIAGNOSIS — R5383 Other fatigue: Secondary | ICD-10-CM | POA: Diagnosis not present

## 2019-07-04 NOTE — Procedures (Signed)
PATIENT'S NAME:  Tammy Allison, Tammy Allison DOB:      12-May-1935      MR#:    [id number]     DATE OF RECORDING: 06/24/2019 REFERRING M.D.:  Antony Contras, MD/ [name] MD Study Performed:   Expanded EEG Polysomnogram HISTORY:  Tammy Allison is a 84 y.o. year old White or Caucasian female patient seen here as a referral on 06/08/2019 from Dr. Leonie Allison. Chief concern according to patient: "I am so weak I can hardly function" I have the pleasure of seeing Tammy Allison today, a right-handed Caucasian female with a possible sleep disorder.  She has a past medical history of Arthritis, GERD (gastroesophageal reflux disease), H/O hiatal hernia, Hyperlipidemia, Hypertension, Hypothyroidism, Irritable bowel syndrome, and Macular degeneration.  The patient reportedly had a TIA about 5 weeks prior and was seen by stroke MD in follow up. She noted upon waking up that her left arm, hand and leg didn't work. She called her husband who helped her to sit up. She slipped of the bed and fell. Feeling returned to the left side before strength returned. .   She reported that her fatigue began about 12 month ago with attempts to control her BP, and several medications were used. She is currently on 3 medications for HTN control.  The patient was seen by Dr Tammy Allison- Stroke MD and Venancio Poisson on 06-02-2019: HPI: Tammy Allison who is being seen today for initial in office hospital follow-up regarding right brain TIA secondary to small vessel disease on 04/25/2019. History obtained from patient, daughter and chart review. Reviewed all radiology images and labs personally.    She reports not feeling refreshed or restored in AM, with symptoms such as dry mouth, but no headaches- just residual fatigue. Naps are taken frequently, can be 11 AM and lasting from 30-40 minutes and are more refreshing than nocturnal sleep. She naps in a recliner.    The patient endorsed the Epworth Sleepiness Scale at 6/24 points.   The patient's  weight 134 pounds with a height of 64 (inches), resulting in a BMI of 23.0 kg/m2. The patient's neck circumference measured 14 inches.  CURRENT MEDICATIONS: Aspirin, Zyrtec, Vitamin D, Apresoline, Synthroid, Cozaar, Lopressor, Protonix, Align, Crestor.   PROCEDURE:  This is a multichannel digital polysomnogram utilizing the Somnostar 11.2 system.  Electrodes and sensors were applied and monitored per AASM Specifications.   EEG, EOG, Chin and Limb EMG, were sampled at 200 Hz.  ECG, Snore and Nasal Pressure, Thermal Airflow, Respiratory Effort, CPAP Flow and Pressure, Oximetry was sampled at 50 Hz. Digital video and audio were recorded.      BASELINE STUDY: Lights Out was at 21:50 and Lights On at 05:02.  Total recording time (TRT) was 433 minutes, with a total sleep time (TST) of 75.5 minutes.   The patient's sleep latency was 368 minutes.  REM latency was 0 minutes.  The sleep efficiency was extremely poor at 17.4 %.     SLEEP ARCHITECTURE: WASO (Wake after sleep onset) was 338 minutes.  There were 3 minutes in Stage N1, 72.5 minutes Stage N2, 0 minutes Stage N3 and 0 minutes in Stage REM.  The percentage of Stage N1 was 4.0%, Stage N2 was 96%, Stage N3 was 0% and Stage R (REM sleep) was 0%.   RESPIRATORY ANALYSIS:  There were a total of 0 respiratory events:   The total APNEA/HYPOPNEA INDEX (AHI) was 0/hour.  0 events occurred in REM sleep and 0 events in NREM.  The REM AHI was 0 /hour, versus a non-REM AHI of 0. The patient spent 20.5 minutes of total sleep time in the supine position and 55 minutes in non-supine. The supine AHI was 0.0 versus a non-supine AHI of 0.0.  OXYGEN SATURATION & C02:  The Wake baseline 02 saturation was 92%, with the lowest being 76%. Time spent below 89% saturation equaled 1 minutes.   The arousals were noted as: 5 were spontaneous, 6 were associated with PLMs, and 0 were associated with respiratory events. The patient had a total of 66 Periodic Limb Movements.  The  Periodic Limb Movement (PLM) index was 52.5 and the PLM Arousal index was 4.8/hour.   Audio and video analysis did not show any abnormal or unusual movements, behaviors, phonations or vocalizations. There was just very little sleep.   EKG was in keeping with normal sinus rhythm (NSR). Post-study, the patient indicated that sleep was interrupted by lower back pain.   IMPRESSION:  1. According to patient, pain induced insomnia.  2. Periodic Limb Movement Disorder (PLMD)  RECOMMENDATIONS:  1. Advise primary neurologist/ PCP to evaluate for back pain and to optimize therapy. The PLMs are likely related.  2. There was no evidence of sleep related Apnea, hypoxia or abnormal EEG.   3. The patient does not need to follow up in the sleep clinic.    I certify that I have reviewed the entire raw data recording prior to the issuance of this report in accordance with the Standards of Accreditation of the American Academy of Sleep Medicine (AASM)   Larey Seat, MD   07-04-2019 Diplomat, American Board of Psychiatry and Neurology  Diplomat, American Board of [place] Director, [place] [facility] [employer]

## 2019-07-05 ENCOUNTER — Telehealth: Payer: Self-pay | Admitting: Neurology

## 2019-07-05 NOTE — Telephone Encounter (Signed)
-----   Message from Larey Seat, MD sent at 07/04/2019  8:53 AM EDT ----- IMPRESSION:   1. According to patient, pain induced insomnia.  2. Periodic Limb Movement Disorder (PLMD)   RECOMMENDATIONS:   1. Advise primary neurologist/ PCP to evaluate for back pain and  to optimize therapy. The PLMs are likely related.  2. There was no evidence of sleep related Apnea, hypoxia or  abnormal EEG.   3. The patient does not need to follow up in the sleep clinic.    I certify that I have reviewed the entire raw data recording  prior to the issuance of this report in accordance with the  Standards of Accreditation of the [employer] of Sleep  Medicine (AASM)    Larey Seat, MD  07-04-2019

## 2019-07-05 NOTE — Telephone Encounter (Signed)
Called the patient and reviewed the sleep study with her. Informed her there was very poor sleep efficiency the night of the study with only 75.5 min of recorded sleep In that time frame we didn't note any sleep apnea of sleep related hypoxia. Heart rhythm stayed in NSR as well as the EEG was normal. Advised the pt that there was PLM's noted but could likely be related to pain or discomfort. Patient didn't mention having pain only that she was unable to get comfortable. Pt verbalized understanding and asked a copy be sent to PCP. Sent for the pt. Pt verbalized understanding.

## 2019-07-26 NOTE — Progress Notes (Signed)
[facility] Note  07/27/2019     CHIEF COMPLAINT Patient presents for Retina Follow Up   HISTORY OF PRESENT ILLNESS: Tammy Allison is a 84 y.o. female who presents to the clinic today for:   HPI    Retina Follow Up    Patient presents with  Dry AMD.  In right eye.  This started 5.  Duration of months.  Since onset it is stable.  I, the attending physician,  performed the HPI with the patient and updated documentation appropriately.          Comments    F/U Exu ARMD OD. Patient states she feels her vision "not as good as it once was"denies new visual issues/onsets. Denies eye gtt's. Pt reports she has been having extreme fatigue, not sure why.       Last edited by Bernarda Caffey, MD on 07/27/2019  9:51 AM. (History)    pt states she feels like her right eye vision is down, she states she does not see any floaters, she states she has had a CT scan recently, and it did not show another TIA, she states she has been very weak lately   Referring physician: [name], MD [place],  [place] [address]  HISTORICAL INFORMATION:   Selected notes from the [facility] Referred by Dr. Quentin Ore for exu ARMD OD LEE: 07.01.19 Read Drivers) [BCVA: OD: 20/40+2 OS: 20/25] Ocular Hx-non-exu ARMD OS, open angle glaucoma, pseudophakia OU, PVD, corneal guttata, miosis, asteroid hyalosis, posterior capsular opacification OU PMH-HTN, high cholesterol, thyroid disease    CURRENT MEDICATIONS: No current outpatient medications on file. (Ophthalmic Drugs)   No current facility-administered medications for this visit.  (Ophthalmic Drugs)   Current Outpatient Medications (Other)  Medication Sig  . aspirin EC 81 MG EC tablet Take 1 tablet (81 mg total) by mouth daily.  . cetirizine (ZYRTEC) 10 MG tablet Take 5 mg by mouth at bedtime.   . cholecalciferol (VITAMIN D) 1000 UNITS tablet Take 1,000 Units by mouth every morning.   . hydrALAZINE  (APRESOLINE) 50 MG tablet Take 1 tablet (50 mg total) by mouth 3 (three) times daily.  Marland Kitchen levothyroxine (SYNTHROID) 75 MCG tablet Take 75 mcg by mouth daily before breakfast.  . losartan (COZAAR) 100 MG tablet Take 1 tablet (100 mg total) by mouth daily.  . pantoprazole (PROTONIX) 40 MG tablet Take 40 mg by mouth every morning.   . Probiotic Product (ALIGN) 4 MG CAPS Take 4 mg by mouth daily.  . rosuvastatin (CRESTOR) 5 MG tablet Take 1 tablet (5 mg total) by mouth at bedtime.  . metoprolol tartrate (LOPRESSOR) 50 MG tablet Take 50 mg by mouth 2 (two) times daily.   No current facility-administered medications for this visit.  (Other)      REVIEW OF SYSTEMS: ROS    Positive for: Eyes   Negative for: Constitutional, Gastrointestinal, Neurological, Skin, Genitourinary, Musculoskeletal, HENT, Endocrine, Cardiovascular, Respiratory, Psychiatric, Allergic/Imm, Heme/Lymph   Last edited by Zenovia Jordan, LPN on [id number]  624THL AM. (History)       ALLERGIES Allergies  Allergen Reactions  . Sulfa Antibiotics Hives  . Carvedilol Rash    PAST MEDICAL HISTORY Past Medical History:  Diagnosis Date  . Arthritis   . GERD (gastroesophageal reflux disease)   . H/O hiatal hernia   . Hyperlipidemia   . Hypertension   . Hypothyroidism   . Irritable bowel syndrome   . Macular degeneration  Exudative - OD   Past Surgical History:  Procedure Laterality Date  . CATARACT EXTRACTION    . EYE SURGERY Bilateral    cataract surgery w/ lens implant  . FRACTURE SURGERY     right hip  . LUMBAR LAMINECTOMY/DECOMPRESSION MICRODISCECTOMY Left 05/25/2014   Procedure: LUMBAR LAMINECTOMY/DECOMPRESSION MICRODISCECTOMY 1 LEVEL   lumbar three;  Surgeon: Hosie Spangle, MD;  Location: [facility];  Service: Neurosurgery;  Laterality: Left;  . THYROID SURGERY      FAMILY HISTORY Family History  Problem Relation Age of Onset  . Macular degeneration Mother     SOCIAL HISTORY Social History    Tobacco Use  . Smoking status: Never Smoker  . Smokeless tobacco: Never Used  Substance Use Topics  . Alcohol use: No  . Drug use: Never         OPHTHALMIC EXAM:  Base Eye Exam    Visual Acuity (Snellen - Linear)      Right Left   Dist cc 20/60 +1 20/25 -1   Dist ph cc 20/60 +2 NI       Tonometry (Tonopen, 9:16 AM)      Right Left   Pressure 15 12       Pupils      Dark Light Shape React APD   Right 3 2 Round Brisk None   Left 3 2 Round Brisk None       Visual Fields (Counting fingers)      Left Right    Full Full       Extraocular Movement      Right Left    Full, Ortho Full, Ortho       Neuro/Psych    Oriented x3: Yes   Mood/Affect: Normal       Dilation    Both eyes: 1.0% Mydriacyl, 2.5% Phenylephrine @ 9:16 AM        Slit Lamp and Fundus Exam    Slit Lamp Exam      Right Left   Lids/Lashes Dermatochalasis - upper lid Dermatochalasis - upper lid   Conjunctiva/Sclera White and quiet White and quiet   Cornea Arcus, 1+ Punctate epithelial erosions, 1-2+ Guttata Arcus, 1+ Punctate epithelial erosions   Anterior Chamber Deep and quiet Deep and quiet   Iris Round and dilated Round and dilated   Lens PC IOL in good position with open PC PC IOL in good position with open PC   Vitreous Vitreous syneresis, Posterior vitreous detachment Vitreous syneresis, Posterior vitreous detachment       Fundus Exam      Right Left   Disc Sharp rim, mild Pallor sharp rim, trace pallor, Tilted disc   C/D Ratio 0.4 0.4   Macula Flat, blunted foveal reflex, Large central PEDs with interval improvement in SRF overlying, pigment mottling and clumping, Drusen, no heme flat, Blunted foveal reflex, +PED, Drusen, Retinal pigment epithelial mottling and clumping, no heme   Vessels Vascular attenuation, Tortuous, AV crossing changes Vascular attenuation, Tortuous, AV crossing changes, Copper wiring   Periphery Attached Attached             IMAGING AND PROCEDURES   Imaging and Procedures for @TODAY @  OCT, Retina - OU - Both Eyes       Right Eye Quality was good. Central Foveal Thickness: 387. Progression has improved. Findings include abnormal foveal contour, no IRF, pigment epithelial detachment, retinal drusen , subretinal fluid, outer retinal atrophy (Interval improvement in SRF overlying persistent PED).   Left  Eye Quality was good. Central Foveal Thickness: 293. Progression has been stable. Findings include normal foveal contour, pigment epithelial detachment, no IRF, no SRF.   Notes *Images captured and stored on drive  Diagnosis / Impression:  OD: Exudative ARMD -- interval improvement in SRF overlying persistent PED  OS: Non-Exudative ARMD w/ stable PEDs  Clinical management:  See below  Abbreviations: NFP - Normal foveal profile. CME - cystoid macular edema. PED - pigment epithelial detachment. IRF - intraretinal fluid. SRF - subretinal fluid. EZ - ellipsoid zone. ERM - epiretinal membrane. ORA - outer retinal atrophy. ORT - outer retinal tubulation. SRHM - subretinal hyper-reflective material                  ASSESSMENT/PLAN:    ICD-10-CM   1. Exudative age-related macular degeneration of right eye with active choroidal neovascularization ([place])  H35.3211   2. 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 Retinal edema  H35.81 OCT, Retina - OU - Both Eyes  4. Posterior vitreous detachment of both eyes  H43.813   5. Pseudophakia of both eyes  Z96.1   6. PCO (posterior capsular opacification), bilateral  H26.493   7. Dry eyes  H04.123     1,3. Exudative age related macular degeneration, OD              - initial BCVA OD 20/30             - initial OCT w/ PEDs OU (OD > OS); mild SRF overlying PED OD  - initial FA with mild CNVM OD  - S/P IVA OD #1 (07.12.19), #2 (08.12.19), #3 (09.09.19)  - S/P IVE #1 OD (10.07.19), #2 (11.04.19), #3 (12.02.19), #4 (12.30.19), #5  (01.27.20), #6 (03.09.20), #7 (05.04.20)  - today, BCVA decreased further to 20/60+2 from 20/50, but OCT shows interval improvement in SRF overlying PEDs  - pt had a TIA on June 2 causing left sided weakness but no vision or speech changes  - discussed potential increase in risk of stroke/TIA/MI with intravitreal anti-VEGF injections  - recommend holding off on IVA today as SRF is improved  - [place] paperwork signed and benefits investigation started on 09.09.19 -- approved for Good Days 2020  - f/u in 6-8 weeks, sooner PRN  2. Age related macular degeneration, non-exudative, OS  - The incidence, anatomy, and pathology of dry AMD, risk of progression, and the AREDS and AREDS 2 study including smoking risks discussed with patient  - continue amsler grid monitoring  4. PVD / vitreous syneresis OU  Discussed findings and prognosis  No RT or RD on 360 scleral depressed exam  Reviewed s/s of RT/RD  Strict return precautions for any such RT/RD signs/symptoms  5. Pseudophakia OU  - s/p CE/IOL OU  - beautiful surgeries, doing well  - monitor  6. PCO OU (OS > OD)  - s/p YAG cap OU (09/2018) w/ Dr. Kathlen Mody  - doing well, VA improved OU  7. Dry eyes OU - recommend artificial tears and lubricating ointment as needed    Ophthalmic Meds Ordered this visit:  No orders of the defined types were placed in this encounter.      Return for f/u 6-8 weeks, exu ARMD OD, DFE, OCT.  There are no Patient Instructions on file for this visit.   Explained the diagnoses, plan, and follow up with the patient and they expressed understanding.  Patient expressed understanding of the importance of proper follow up  care.   This document serves as a record of services personally performed by Gardiner Sleeper, MD, PhD. It was created on their behalf by Ernest Mallick, OA, an ophthalmic assistant. The creation of this record is the provider's dictation and/or activities during the visit.    Electronically signed  by: Ernest Mallick, OA  09.08.2020 1:41 PM     Gardiner Sleeper, M.D., Ph.D. Diseases & Surgery of the Retina and Vitreous [facility]  I have reviewed the above documentation for accuracy and completeness, and I agree with the above. Gardiner Sleeper, M.D., Ph.D. 07/27/19 1:41 PM     Abbreviations: M myopia (nearsighted); A astigmatism; H hyperopia (farsighted); P presbyopia; Mrx spectacle prescription;  CTL contact lenses; OD right eye; OS left eye; OU both eyes  XT exotropia; ET esotropia; PEK punctate epithelial keratitis; PEE punctate epithelial erosions; DES dry eye syndrome; MGD meibomian gland dysfunction; ATs artificial tears; PFAT's preservative free artificial tears; [place] nuclear sclerotic cataract; PSC posterior subcapsular cataract; ERM epi-retinal membrane; PVD posterior vitreous detachment; RD retinal detachment; DM diabetes mellitus; DR diabetic retinopathy; NPDR non-proliferative diabetic retinopathy; PDR proliferative diabetic retinopathy; CSME clinically significant macular edema; DME diabetic macular edema; dbh dot blot hemorrhages; CWS cotton wool spot; POAG primary open angle glaucoma; C/D cup-to-disc ratio; HVF humphrey visual field; GVF goldmann visual field; OCT optical coherence tomography; IOP intraocular pressure; BRVO Branch retinal vein occlusion; CRVO central retinal vein occlusion; CRAO central retinal artery occlusion; BRAO branch retinal artery occlusion; RT retinal tear; SB scleral buckle; PPV pars plana vitrectomy; VH Vitreous hemorrhage; PRP panretinal laser photocoagulation; IVK intravitreal kenalog; VMT vitreomacular traction; MH Macular hole;  NVD neovascularization of the disc; NVE neovascularization elsewhere; AREDS age related eye disease study; ARMD age related macular degeneration; POAG primary open angle glaucoma; EBMD epithelial/anterior basement membrane dystrophy; ACIOL anterior chamber intraocular lens; IOL intraocular lens; PCIOL  posterior chamber intraocular lens; Phaco/IOL phacoemulsification with intraocular lens placement; [place] photorefractive keratectomy; LASIK laser assisted in situ keratomileusis; HTN hypertension; DM diabetes mellitus; COPD chronic obstructive pulmonary disease

## 2019-07-27 ENCOUNTER — Encounter (INDEPENDENT_AMBULATORY_CARE_PROVIDER_SITE_OTHER): Payer: Self-pay | Admitting: Ophthalmology

## 2019-07-27 ENCOUNTER — Ambulatory Visit (INDEPENDENT_AMBULATORY_CARE_PROVIDER_SITE_OTHER): Payer: Medicare Other | Admitting: Ophthalmology

## 2019-07-27 ENCOUNTER — Other Ambulatory Visit: Payer: Self-pay

## 2019-07-27 DIAGNOSIS — H3581 Retinal edema: Secondary | ICD-10-CM | POA: Diagnosis not present

## 2019-07-27 DIAGNOSIS — Z961 Presence of intraocular lens: Secondary | ICD-10-CM

## 2019-07-27 DIAGNOSIS — H04123 Dry eye syndrome of bilateral lacrimal glands: Secondary | ICD-10-CM

## 2019-07-27 DIAGNOSIS — H43813 Vitreous degeneration, bilateral: Secondary | ICD-10-CM

## 2019-07-27 DIAGNOSIS — H353211 Exudative age-related macular degeneration, right eye, with active choroidal neovascularization: Secondary | ICD-10-CM | POA: Diagnosis not present

## 2019-07-27 DIAGNOSIS — H26493 Other secondary cataract, bilateral: Secondary | ICD-10-CM

## 2019-07-27 DIAGNOSIS — H353122 Nonexudative age-related macular degeneration, left eye, intermediate dry stage: Secondary | ICD-10-CM

## 2019-08-03 DIAGNOSIS — G4761 Periodic limb movement disorder: Secondary | ICD-10-CM | POA: Diagnosis not present

## 2019-08-03 DIAGNOSIS — Z23 Encounter for immunization: Secondary | ICD-10-CM | POA: Diagnosis not present

## 2019-08-03 DIAGNOSIS — I129 Hypertensive chronic kidney disease with stage 1 through stage 4 chronic kidney disease, or unspecified chronic kidney disease: Secondary | ICD-10-CM | POA: Diagnosis not present

## 2019-08-03 DIAGNOSIS — Z6822 Body mass index (BMI) 22.0-22.9, adult: Secondary | ICD-10-CM | POA: Diagnosis not present

## 2019-08-03 DIAGNOSIS — E222 Syndrome of inappropriate secretion of antidiuretic hormone: Secondary | ICD-10-CM | POA: Diagnosis not present

## 2019-08-09 ENCOUNTER — Ambulatory Visit (INDEPENDENT_AMBULATORY_CARE_PROVIDER_SITE_OTHER): Payer: Medicare Other | Admitting: Adult Health

## 2019-08-09 ENCOUNTER — Other Ambulatory Visit: Payer: Self-pay

## 2019-08-09 ENCOUNTER — Encounter: Payer: Self-pay | Admitting: Adult Health

## 2019-08-09 VITALS — BP 133/66 | HR 102 | Temp 97.5°F | Ht 64.0 in | Wt 132.8 lb

## 2019-08-09 DIAGNOSIS — G459 Transient cerebral ischemic attack, unspecified: Secondary | ICD-10-CM | POA: Diagnosis not present

## 2019-08-09 DIAGNOSIS — I10 Essential (primary) hypertension: Secondary | ICD-10-CM

## 2019-08-09 DIAGNOSIS — G4761 Periodic limb movement disorder: Secondary | ICD-10-CM

## 2019-08-09 DIAGNOSIS — G4700 Insomnia, unspecified: Secondary | ICD-10-CM

## 2019-08-09 DIAGNOSIS — E785 Hyperlipidemia, unspecified: Secondary | ICD-10-CM | POA: Diagnosis not present

## 2019-08-09 MED ORDER — GABAPENTIN 300 MG PO CAPS: 300.0000 mg | ORAL_CAPSULE | Freq: Every day | ORAL | 3 refills | Status: DC

## 2019-08-09 NOTE — Progress Notes (Signed)
[facility] [address] [place]. [place] [address] [phone]       HOSPITAL FOLLOW UP NOTE  Tammy Allison Date of Birth:  01-Nov-1935 Medical Record Number:  [id number]   Reason for visit: TIA 04/25/2019    CHIEF COMPLAINT:  Chief Complaint  Patient presents with   Follow-up    Rm 9, alone   Transient Ischemic Attack    just really fatigued (cannot find reason).      HPI:  Stroke/TIA admission 04/25/2019: [name] is a 84 y.o. female with history of HTN and HLD  who presented to [facility] ED on 04/25/2019 with L sided weakness and numbness.  CT head negative for acute abnormality but did show old basal ganglia infarcts.  CTA head/neck showed left P2 high-grade stenosis but no evidence of LVO.  MRI negative for acute abnormalities but again showed evidence of dominant basal ganglia and hemispheric white matter ischemic changes.  2D echo unremarkable.  LDL 143 and A1c 5.3.  Initiated DAPT for 3 weeks and aspirin alone.  HTN stable.  Initiated Crestor as she declined atorvastatin for HLD management.  Other stroke risk factors include advanced age and prior history of stroke on imaging.  Initial visit 06/02/2019: Stable from neurological standpoint until 2 days ago when she was out shopping with her daughter and she started to a pulling sensation towards her right.  She denies any weakness, dizziness or lightheadedness sensation at that time.  She was able to continue walking and attempts to find a seat but ended up leaning on a counter until her daughter came to help her.  She was able to ambulate independently back into the car out from a store and sensation resolved after approximately 20 minutes.  She denies any associated neurological symptoms.  She also endorses generalized fatigue/weakness feeling which has been present over the past 6 to 12 months and believes this is due to continued change in blood pressure medications by cardiology.  She continues to  be followed by Dr. [name] who has attempted to encourage her to undergo sleep study but previously declined as she felt as though symptoms were due to medications.  She is willing at this time to undergo sleep study at this time.  She does endorse nighttime snoring, insomnia and excessive daytime fatigue.  Blood pressure has been stable with today's reading 137/64.  She continues on Crestor without myalgias.  Completed 3 weeks DAPT and continues on aspirin alone without bleeding or bruising.  She was initially doing home health therapy which has since been discontinued.  She continues to live with her husband and is able to maintain ADLs and IADLs independently.  No further concerns at this time.  *Upon evaluation/assessment, left hemiparesis noted along with left facial droop.  Upon further questioning, she does endorse noticing slight difficulty with raising left leg such as when she puts pants on.  She denies difficulty using left arm/hand.  She is right-hand dominant.  She has been experiencing increasing mouth breathing at night and will wake up with a dry mouth which she has not experienced previously.  She denies numbness/tingling or speech difficulty.  Update 08/09/2019: Tammy Allison is being seen today for stroke follow-up.  She has been stable from a neurological standpoint.  CT head obtained due to left-sided weakness but imaging unremarkable for acute etiology.  She did undergo sleep study on 06/24/2019 with difficulty sleeping with only 75 minutes of recorded sleep time but during that time,  no evidence of sleep apnea.  Felt insomnia was related to pain and PLM.  She continues to report insomnia with excessive daytime fatigue.  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  She has not noticed much benefit at this time.  She is overly frustrated with ongoing daytime fatigue.  She reports having no energy for daily functioning and can even interfere with ADL/IADLs.  She did have recent lab work done by PCP  but is unsure of what tests were performed.  She continues on aspirin and Crestor for secondary stroke prevention without side effects.  Blood pressure today stable.  No further concerns at this time.         ROS:   14 system review of systems performed and negative with exception of fatigue  PMH:  Past Medical History:  Diagnosis Date   Arthritis    GERD (gastroesophageal reflux disease)    H/O hiatal hernia    Hyperlipidemia    Hypertension    Hypothyroidism    Irritable bowel syndrome    Macular degeneration    Exudative - OD    PSH:  Past Surgical History:  Procedure Laterality Date   CATARACT EXTRACTION     EYE SURGERY Bilateral    cataract surgery w/ lens implant   FRACTURE SURGERY     right hip   LUMBAR LAMINECTOMY/DECOMPRESSION MICRODISCECTOMY Left 05/25/2014   Procedure: LUMBAR LAMINECTOMY/DECOMPRESSION MICRODISCECTOMY 1 LEVEL   lumbar three;  Surgeon: Hosie Spangle, MD;  Location: [facility];  Service: Neurosurgery;  Laterality: Left;   THYROID SURGERY      Social History:  Social History   Socioeconomic History   Marital status: Married    Spouse name: Not on file   Number of children: Not on file   Years of education: Not on file   Highest education level: Not on file  Occupational History   Not on file  Social Needs   Financial resource strain: Not on file   Food insecurity    Worry: Not on file    Inability: Not on file   Transportation needs    Medical: Not on file    Non-medical: Not on file  Tobacco Use   Smoking status: Never Smoker   Smokeless tobacco: Never Used  Substance and Sexual Activity   Alcohol use: No   Drug use: Never   Sexual activity: Not on file  Lifestyle   Physical activity    Days per week: Not on file    Minutes per session: Not on file   Stress: Not on file  Relationships   Social connections    Talks on phone: Not on file    Gets together: Not on file    Attends religious  service: Not on file    Active member of club or organization: Not on file    Attends meetings of clubs or organizations: Not on file    Relationship status: Not on file   Intimate partner violence    Fear of current or ex partner: Not on file    Emotionally abused: Not on file    Physically abused: Not on file    Forced sexual activity: Not on file  Other Topics Concern   Not on file  Social History Narrative   Not on file    Family History:  Family History  Problem Relation Age of Onset   Macular degeneration Mother     Medications:   Current Outpatient Medications on File Prior to  Visit  Medication Sig Dispense Refill   aspirin EC 81 MG EC tablet Take 1 tablet (81 mg total) by mouth daily.     cetirizine (ZYRTEC) 10 MG tablet Take 5 mg by mouth at bedtime.      cholecalciferol (VITAMIN D) 1000 UNITS tablet Take 1,000 Units by mouth every morning.      hydrALAZINE (APRESOLINE) 50 MG tablet Take 1 tablet (50 mg total) by mouth 3 (three) times daily. 270 tablet 3   levothyroxine (SYNTHROID) 88 MCG tablet      losartan (COZAAR) 100 MG tablet Take 1 tablet (100 mg total) by mouth daily.     pantoprazole (PROTONIX) 40 MG tablet Take 40 mg by mouth every morning.      Probiotic Product (ALIGN) 4 MG CAPS Take 4 mg by mouth daily.     rosuvastatin (CRESTOR) 5 MG tablet Take 1 tablet (5 mg total) by mouth at bedtime. 30 tablet 0   pramipexole (MIRAPEX) 0.5 MG tablet TK 1 T PO QD     No current facility-administered medications on file prior to visit.     Allergies:   Allergies  Allergen Reactions   Sulfa Antibiotics Hives   Carvedilol Rash     Physical Exam  Vitals:   08/09/19 1018  BP: 133/66  Pulse: (!) 102  Temp: (!) 97.5 F (36.4 C)  SpO2: 97%  Weight: 132 lb 12.8 oz (60.2 kg)  Height: 5\' 4"  (1.626 m)   Body mass index is 22.8 kg/m. No exam data present  No flowsheet data found.   General: well developed, well nourished,  pleasant elderly  Caucasian female, seated, in no evident distress Head: head normocephalic and atraumatic.   Neck: supple with no carotid or supraclavicular bruits Cardiovascular: regular rate and rhythm, no murmurs Musculoskeletal: no deformity Skin:  no rash/petichiae Vascular:  Normal pulses all extremities   Neurologic Exam Mental Status: Awake and fully alert.   Speech normal.  Oriented to place and time. Recent and remote memory intact. Attention span, concentration and fund of knowledge appropriate. Mood and affect appropriate.  Cranial Nerves: Fundoscopic exam reveals sharp disc margins. Pupils equal, briskly reactive to light. Extraocular movements full without nystagmus. Visual fields full to confrontation. Hearing intact. Facial sensation intact.   Motor: Normal bulk and tone.  LUE: 4+/5 grip strength; LLE: 4+/5 hip flexor; full strength in right upper and lower extremity Sensory.: intact to touch , pinpri ck , position and vibratory sensation.  Coordination: Mildly decreased finger tapping left hand.  No evidence of abnormality on right side Gait and Station: Arises from chair without difficulty. Stance is normal.  Gait abnormality with mild favoring of left leg Reflexes: 1+ and symmetric. Toes downgoing.      Diagnostic Data (Labs, Imaging, Testing)  CT HEAD WO CONTRAST 06/13/2019 IMPRESSION: This CT scan of the head without contrast shows the following: 1.  Chronic lacunar infarctions in the thalamus bilaterally and in the right basal ganglia.  These were also noted on the brain MRI from earlier this year. 2.  Chronic microvascular ischemic changes in the hemisphere, also seen on the previous brain MRI. 3.  Right mastoid effusion that could be due to eustachian tube dysfunction, also present on the recent brain MRI. 4.  There are no acute findings.    ASSESSMENT: Tammy Allison is a 84 y.o. year old female who presented to [facility] ED on 04/25/2019 with left-sided weakness and numbness with  diagnosis of right brain TIA secondary to  small vessel disease.  Vascular risk factors include HTN, HLD and prior stroke on imaging.  Mild left-sided weakness unchanged from prior examination.  CT head unremarkable.  She continues to have excessive daytime fatigue and insomnia.  Recent sleep study negative for sleep apnea but did show PLMD.    PLAN:  1. TIA: Continue aspirin 81 mg daily  and Crestor for secondary stroke prevention. Maintain strict control of hypertension with blood pressure goal below 130/90, diabetes with hemoglobin A1c goal below 6.5% and cholesterol with LDL cholesterol (bad cholesterol) goal below 70 mg/dL.  I also advised the patient to eat a healthy diet with plenty of whole grains, cereals, fruits and vegetables, exercise regularly with at least 30 minutes of continuous activity daily and maintain ideal body weight. 2. Insomnia/fatigue: Continues to be followed by PCP for evaluation regarding ongoing concerns.  She did have recent lab work done but unsure of what this included.  QuestionED if iron level, TSH and vitamin B12 checked but she is unsure.  She is currently being treated for hyponatremia with fluid restriction and reports repeat levels will be done next week.  Discussion regarding chance of excessive fatigue being related to ongoing insomnia 3. PLMD: Initiate gabapentin 300 mg nightly along with continuation of Mirapex (prescribed by PCP) 4. HTN: Advised to continue current treatment regimen.  Advised to continue to monitor at home along with continued follow-up with cardiology 5. HLD: Advised to continue current treatment regimen along with continued follow-up with PCP for future prescribing and monitoring of lipid panel   Follow up in 2 months or call earlier if needed   Greater than 50% of time during this 25 minute visit was spent on counseling, explanation of diagnosis of TIA, reviewing risk factor management of HTN and HLD, discussion regarding ongoing daytime  fatigue and insomnia, discussion regarding recent sleep study results and recommendations, planning of further management along with potential future management, and discussion with patient answering all questions.    Frann Rider, AGNP-BC  [facility] [address] [place], [place] [address]  Phone [phone] Fax [phone] Note: This document was prepared with digital dictation and possible smart phrase technology. Any transcriptional errors that result from this process are unintentional.

## 2019-08-09 NOTE — Progress Notes (Addendum)
Fax confirmation for Dr. Sabra Heck for ofv note, lab tests received. Received notes and labs today 08-11-19 to JM/NP desk.

## 2019-08-09 NOTE — Progress Notes (Signed)
I agree with the above plan 

## 2019-08-09 NOTE — Patient Instructions (Signed)
Start gabapentin 300 mg nightly to help with periodic limb movement disorder along with possible benefit of insomnia  Continue to follow with PCP for ongoing evaluation of daytime fatigue -this could be related to your insomnia and lack of sleep or other abnormality that should be further evaluated  Continue aspirin 81 mg daily  and Crestor for secondary stroke prevention  Continue to follow up with PCP regarding cholesterol and blood pressure management   Continue to monitor blood pressure at home  Maintain strict control of hypertension with blood pressure goal below 130/90, diabetes with hemoglobin A1c goal below 6.5% and cholesterol with LDL cholesterol (bad cholesterol) goal below 70 mg/dL. I also advised the patient to eat a healthy diet with plenty of whole grains, cereals, fruits and vegetables, exercise regularly and maintain ideal body weight.  Followup in the future with me in 2 months or call earlier if needed       Thank you for coming to see [place] at [facility]. I hope we have been able to provide you high quality care today.  You may receive a patient satisfaction survey over the next few weeks. We would appreciate your feedback and comments so that we may continue to improve ourselves and the health of our patients.

## 2019-08-15 ENCOUNTER — Telehealth: Payer: Self-pay | Admitting: Adult Health

## 2019-08-15 DIAGNOSIS — Z1231 Encounter for screening mammogram for malignant neoplasm of breast: Secondary | ICD-10-CM | POA: Diagnosis not present

## 2019-08-15 NOTE — Telephone Encounter (Signed)
I called pt and she relayed that since taking the gabapentin 300mg  po qhs (she states she has felt stronger). She still is having trouble sleeping over other night.  ( this taking gabapentin alone or with mirapex).  Does she need to take both?  She does not have anxiety or stress, does not work outside home.  She is asking about labs, will see Dr. Sabra Heck.  Does she need some from [place]?  Fe??

## 2019-08-15 NOTE — Telephone Encounter (Signed)
I called and could not LM. (memory full).

## 2019-08-15 NOTE — Telephone Encounter (Signed)
Pt is calling in wanting to discuss her Gabapentin, pt states she isnt having side effects she just wants to discuss it

## 2019-08-15 NOTE — Telephone Encounter (Signed)
Pt as called RN Lovey Newcomer back to discuss her concerns [phone] please call mobile

## 2019-08-16 NOTE — Telephone Encounter (Signed)
I called pt back relayed that per JM/NP labs reviewed from pcp, no iron levels done.  (may discuss with pcp if feels like it is needed).  May increase gabapentin if she feels like this is working, and feels like she needs more of it.  She will monitor for now and call us back as needed.  I relayed that per recommendation of JM/NP that it might improve sleep quality  By taking both the gabapentin and mirapex.  Pt verbalized understanding.

## 2019-08-16 NOTE — Telephone Encounter (Signed)
Please advise patient that I have reviewed her lab work obtained by PCP.  Her overall blood counts have been stable therefore unlikely low iron levels but she can further discuss this with her PCP.  We can increase gabapentin dose if she is interested to see if this helps her further.  I would recommend continuation of Mirapex in addition to her gabapentin which will help with her limb movement disorder that can be affecting her sleep quality.  Please let me know if she is interested in increasing dose of gabapentin.

## 2019-08-17 DIAGNOSIS — E871 Hypo-osmolality and hyponatremia: Secondary | ICD-10-CM | POA: Diagnosis not present

## 2019-08-31 ENCOUNTER — Telehealth: Payer: Self-pay | Admitting: Adult Health

## 2019-08-31 NOTE — Telephone Encounter (Signed)
Pt called wanting to talk to the RN because she has been experiencing SOB in the last couple of days and she would like to know if it has to do with her gabapentin (NEURONTIN) 300 MG capsule Please advise.

## 2019-08-31 NOTE — Telephone Encounter (Signed)
Spoke with patient who stated she's been  taking gabapentin 300 mg at night for about 3 -4 weeks. She has experienced several episodes of SOB. She was taking Mirapex at night too, but the next day she couldn't function, so she stopped the mirapex. She stated she is sleeping better, and the gabapentin is working. I advised will send this message to work in dr and call her back with recommendation. Patient verbalized understanding, appreciation.

## 2019-08-31 NOTE — Telephone Encounter (Signed)
I have never seen SOB with Gabapentin but if it is a possible side effect and the SOB started with the gabapentin  then I guess I would stop it for a few days or a week and see if the SOB gets better. If the SOB does not get better (or if it gets worse) then she has to go see her pcp about it asap. She can let us know next week how it is going and we may restart the gabapentin. thanks

## 2019-09-01 NOTE — Telephone Encounter (Signed)
Spoke with patient and advised her of Dr Cathren Laine reply and recommendations. She agreed to plan to stop gabapentin, monitor her SOB, call PCP if needed and call us next week with update. Patient verbalized understanding, appreciation.

## 2019-09-19 NOTE — Progress Notes (Signed)
[facility] Note  09/21/2019     CHIEF COMPLAINT Patient presents for Retina Follow Up   HISTORY OF PRESENT ILLNESS: Tammy Allison is a 84 y.o. female who presents to the clinic today for:   HPI    Retina Follow Up    Patient presents with  Wet AMD.  In right eye.  This started 8 weeks ago.  Severity is moderate.  I, the attending physician,  performed the HPI with the patient and updated documentation appropriately.          Comments    Patient here for 8 weeks retina follow up for exu ARMD OD. Patient states vision doing ok. Not great. Trouble reading a book. Can read some things. No eye pain. OD some days is swollen in am. Not today.        Last edited by Bernarda Caffey, MD on 09/21/2019  9:36 AM. (History)    pt states she is doing well   Referring physician: Hortencia Pilar, MD [place],  [place] [id number]  HISTORICAL INFORMATION:   Selected notes from the MEDICAL RECORD NUMBER Referred by Dr. Quentin Ore for exu ARMD OD LEE: 07.01.19 Read Drivers) [BCVA: OD: 20/40+2 OS: 20/25] Ocular Hx-non-exu ARMD OS, open angle glaucoma, pseudophakia OU, PVD, corneal guttata, miosis, asteroid hyalosis, posterior capsular opacification OU PMH-HTN, high cholesterol, thyroid disease    CURRENT MEDICATIONS: No current outpatient medications on file. (Ophthalmic Drugs)   No current facility-administered medications for this visit.  (Ophthalmic Drugs)   Current Outpatient Medications (Other)  Medication Sig  . aspirin EC 81 MG EC tablet Take 1 tablet (81 mg total) by mouth daily.  . cetirizine (ZYRTEC) 10 MG tablet Take 5 mg by mouth at bedtime.   . cholecalciferol (VITAMIN D) 1000 UNITS tablet Take 1,000 Units by mouth every morning.   . gabapentin (NEURONTIN) 300 MG capsule Take 1 capsule (300 mg total) by mouth at bedtime.  . hydrALAZINE (APRESOLINE) 50 MG tablet Take 1 tablet (50 mg total) by mouth 3 (three) times daily.   Marland Kitchen levothyroxine (SYNTHROID) 88 MCG tablet   . losartan (COZAAR) 100 MG tablet Take 1 tablet (100 mg total) by mouth daily.  . pantoprazole (PROTONIX) 40 MG tablet Take 40 mg by mouth every morning.   . pramipexole (MIRAPEX) 0.5 MG tablet TK 1 T PO QD  . Probiotic Product (ALIGN) 4 MG CAPS Take 4 mg by mouth daily.  . rosuvastatin (CRESTOR) 5 MG tablet Take 1 tablet (5 mg total) by mouth at bedtime.   No current facility-administered medications for this visit.  (Other)      REVIEW OF SYSTEMS: ROS    Positive for: Neurological, Eyes   Negative for: Constitutional, Gastrointestinal, Skin, Genitourinary, Musculoskeletal, HENT, Endocrine, Cardiovascular, Respiratory, Psychiatric, Allergic/Imm, Heme/Lymph   Last edited by Theodore Demark, COA on 09/21/2019  9:04 AM. (History)       ALLERGIES Allergies  Allergen Reactions  . Sulfa Antibiotics Hives  . Carvedilol Rash    PAST MEDICAL HISTORY Past Medical History:  Diagnosis Date  . Arthritis   . GERD (gastroesophageal reflux disease)   . H/O hiatal hernia   . Hyperlipidemia   . Hypertension   . Hypothyroidism   . Irritable bowel syndrome   . Macular degeneration    Exudative - OD   Past Surgical History:  Procedure Laterality Date  . CATARACT EXTRACTION    . EYE SURGERY Bilateral  cataract surgery w/ lens implant  . FRACTURE SURGERY     right hip  . LUMBAR LAMINECTOMY/DECOMPRESSION MICRODISCECTOMY Left 05/25/2014   Procedure: LUMBAR LAMINECTOMY/DECOMPRESSION MICRODISCECTOMY 1 LEVEL   lumbar three;  Surgeon: Hosie Spangle, MD;  Location: [facility];  Service: Neurosurgery;  Laterality: Left;  . THYROID SURGERY      FAMILY HISTORY Family History  Problem Relation Age of Onset  . Macular degeneration Mother     SOCIAL HISTORY Social History   Tobacco Use  . Smoking status: Never Smoker  . Smokeless tobacco: Never Used  Substance Use Topics  . Alcohol use: No  . Drug use: Never         OPHTHALMIC  EXAM:  Base Eye Exam    Visual Acuity (Snellen - Linear)      Right Left   Dist cc 20/50 +2 20/30 +2   Dist ph cc NI 20/25 -1   Correction: Glasses       Tonometry (Tonopen, 9:00 AM)      Right Left   Pressure 12 12       Pupils      Dark Light Shape React APD   Right 3 2 Round Brisk None   Left 3 2 Round Brisk None       Visual Fields (Counting fingers)      Left Right    Full Full       Extraocular Movement      Right Left    Full, Ortho Full, Ortho       Neuro/Psych    Oriented x3: Yes   Mood/Affect: Normal       Dilation    Both eyes: 1.0% Mydriacyl, 2.5% Phenylephrine @ 9:00 AM        Slit Lamp and Fundus Exam    Slit Lamp Exam      Right Left   Lids/Lashes Dermatochalasis - upper lid Dermatochalasis - upper lid   Conjunctiva/Sclera White and quiet White and quiet   Cornea Arcus, 1+ Punctate epithelial erosions, mild Debris in tear film Arcus, 1+ Punctate epithelial erosions, trace Debris in tear film   Anterior Chamber Deep and quiet Deep and quiet   Iris Round and dilated Round and dilated   Lens PC IOL in good position with open PC PC IOL in good position with open PC   Vitreous Vitreous syneresis, Posterior vitreous detachment Vitreous syneresis, Posterior vitreous detachment       Fundus Exam      Right Left   Disc Sharp rim, mild Pallor sharp rim, trace pallor, Tilted disc, temporal Peripapillary atrophy   C/D Ratio 0.4 0.4   Macula Flat, blunted foveal reflex, Large central PEDs with trace persistent SRF overlying -- stable from prior, pigment mottling and clumping, Drusen, no heme flat, Blunted foveal reflex, +PED, Drusen, Retinal pigment epithelial mottling and clumping, no heme   Vessels Vascular attenuation, Tortuous, AV crossing changes Vascular attenuation, Tortuous, AV crossing changes, Copper wiring   Periphery Attached, no heme Attached, no heme        Refraction    Wearing Rx      Sphere Cylinder Axis Add   Right -0.75 +0.50 089  +2.50   Left -0.75 Sphere  +2.50   Type: Bifocal          IMAGING AND PROCEDURES  Imaging and Procedures for @TODAY @  OCT, Retina - OU - Both Eyes       Right Eye Quality was good. Central Foveal  Thickness: 385. Progression has been stable. Findings include abnormal foveal contour, no IRF, pigment epithelial detachment, retinal drusen , subretinal fluid, outer retinal atrophy (Persistent SRF overlying persistent PED).   Left Eye Quality was good. Central Foveal Thickness: 293. Progression has been stable. Findings include normal foveal contour, pigment epithelial detachment, no IRF, no SRF.   Notes *Images captured and stored on drive  Diagnosis / Impression:  OD: Exudative ARMD --tr persistent SRF overlying persistent PED -- stable from prior OS: Non-Exudative ARMD w/ stable PEDs  Clinical management:  See below  Abbreviations: NFP - Normal foveal profile. CME - cystoid macular edema. PED - pigment epithelial detachment. IRF - intraretinal fluid. SRF - subretinal fluid. EZ - ellipsoid zone. ERM - epiretinal membrane. ORA - outer retinal atrophy. ORT - outer retinal tubulation. SRHM - subretinal hyper-reflective material                  ASSESSMENT/PLAN:    ICD-10-CM   1. Exudative age-related macular degeneration of right eye with active choroidal neovascularization (HCC)  H35.3211 CANCELED: Intravitreal Injection, Pharmacologic Agent - OD - Right Eye  2. 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 Retinal edema  H35.81 OCT, Retina - OU - Both Eyes  4. Posterior vitreous detachment of both eyes  H43.813   5. Pseudophakia of both eyes  Z96.1   6. PCO (posterior capsular opacification), bilateral  H26.493   7. Dry eyes  H04.123     1,3. Exudative age related macular degeneration, OD              - initial BCVA OD 20/30             - initial OCT w/ PEDs OU (OD > OS); mild SRF overlying PED OD  - initial FA with  mild CNVM OD  - S/P IVA OD #1 (07.12.19), #2 (08.12.19), #3 (09.09.19)  - S/P IVE #1 OD (10.07.19), #2 (11.04.19), #3 (12.02.19), #4 (12.30.19), #5 (01.27.20), #6 (03.09.20), #7 (05.04.20)  - today, BCVA stable at 20/50, and OCT shows tr persistent SRF overlying PEDs -- no significant worsening despite no IVE since May 2020  - review of prior OCTs shows no significant change in SRF overlying PEDs with or without anti-VEGF therapy  - recommend holding off on IVE  - [place] paperwork signed and benefits investigation started on 09.09.19 -- approved for Good Days 2020  - f/u in 4 months, sooner PRN  2. Age related macular degeneration, non-exudative, OS  - The incidence, anatomy, and pathology of dry AMD, risk of progression, and the AREDS and AREDS 2 study including smoking risks discussed with patient  - continue amsler grid monitoring  4. PVD / vitreous syneresis OU  - Discussed findings and prognosis  - No RT or RD on 360 scleral depressed exam  - Reviewed s/s of RT/RD  - Strict return precautions for any such RT/RD signs/symptoms  5. Pseudophakia OU  - s/p CE/IOL OU  - beautiful surgeries, doing well  - monitor  6. PCO OU (OS > OD)  - s/p YAG cap OU (09/2018) w/ Dr. Kathlen Mody  - doing well, VA improved OU  7. Dry eyes OU - recommend artificial tears and lubricating ointment as needed    Ophthalmic Meds Ordered this visit:  No orders of the defined types were placed in this encounter.      Return in about 4 months (around 01/19/2020) for f/u exu ARMD OD, DFE, OCT.  There are no Patient Instructions on file for this visit.   Explained the diagnoses, plan, and follow up with the patient and they expressed understanding.  Patient expressed understanding of the importance of proper follow up care.   This document serves as a record of services personally performed by Gardiner Sleeper, MD, PhD. It was created on their behalf by Roselee Nova, COMT. The creation of this record is the  provider's dictation and/or activities during the visit.  Electronically signed by: Roselee Nova, COMT 09/22/19 10:46 PM   Gardiner Sleeper, M.D., Ph.D. Diseases & Surgery of the Retina and Vitreous [facility]   I have reviewed the above documentation for accuracy and completeness, and I agree with the above. Gardiner Sleeper, M.D., Ph.D. 09/22/19 10:46 PM    Abbreviations: M myopia (nearsighted); A astigmatism; H hyperopia (farsighted); P presbyopia; Mrx spectacle prescription;  CTL contact lenses; OD right eye; OS left eye; OU both eyes  XT exotropia; ET esotropia; PEK punctate epithelial keratitis; PEE punctate epithelial erosions; DES dry eye syndrome; MGD meibomian gland dysfunction; ATs artificial tears; PFAT's preservative free artificial tears; [place] nuclear sclerotic cataract; PSC posterior subcapsular cataract; ERM epi-retinal membrane; PVD posterior vitreous detachment; RD retinal detachment; DM diabetes mellitus; DR diabetic retinopathy; NPDR non-proliferative diabetic retinopathy; PDR proliferative diabetic retinopathy; CSME clinically significant macular edema; DME diabetic macular edema; dbh dot blot hemorrhages; CWS cotton wool spot; POAG primary open angle glaucoma; C/D cup-to-disc ratio; HVF humphrey visual field; GVF goldmann visual field; OCT optical coherence tomography; IOP intraocular pressure; BRVO Branch retinal vein occlusion; CRVO central retinal vein occlusion; CRAO central retinal artery occlusion; BRAO branch retinal artery occlusion; RT retinal tear; SB scleral buckle; PPV pars plana vitrectomy; VH Vitreous hemorrhage; PRP panretinal laser photocoagulation; IVK intravitreal kenalog; VMT vitreomacular traction; MH Macular hole;  NVD neovascularization of the disc; NVE neovascularization elsewhere; AREDS age related eye disease study; ARMD age related macular degeneration; POAG primary open angle glaucoma; EBMD epithelial/anterior basement membrane  dystrophy; ACIOL anterior chamber intraocular lens; IOL intraocular lens; PCIOL posterior chamber intraocular lens; Phaco/IOL phacoemulsification with intraocular lens placement; [place] photorefractive keratectomy; LASIK laser assisted in situ keratomileusis; HTN hypertension; DM diabetes mellitus; COPD chronic obstructive pulmonary disease

## 2019-09-21 ENCOUNTER — Encounter (INDEPENDENT_AMBULATORY_CARE_PROVIDER_SITE_OTHER): Payer: Self-pay | Admitting: Ophthalmology

## 2019-09-21 ENCOUNTER — Ambulatory Visit (INDEPENDENT_AMBULATORY_CARE_PROVIDER_SITE_OTHER): Payer: Medicare Other | Admitting: Ophthalmology

## 2019-09-21 DIAGNOSIS — H3581 Retinal edema: Secondary | ICD-10-CM | POA: Diagnosis not present

## 2019-09-21 DIAGNOSIS — H353211 Exudative age-related macular degeneration, right eye, with active choroidal neovascularization: Secondary | ICD-10-CM | POA: Diagnosis not present

## 2019-09-21 DIAGNOSIS — H43813 Vitreous degeneration, bilateral: Secondary | ICD-10-CM | POA: Diagnosis not present

## 2019-09-21 DIAGNOSIS — H04123 Dry eye syndrome of bilateral lacrimal glands: Secondary | ICD-10-CM

## 2019-09-21 DIAGNOSIS — H353122 Nonexudative age-related macular degeneration, left eye, intermediate dry stage: Secondary | ICD-10-CM | POA: Diagnosis not present

## 2019-09-21 DIAGNOSIS — H26493 Other secondary cataract, bilateral: Secondary | ICD-10-CM | POA: Diagnosis not present

## 2019-09-21 DIAGNOSIS — Z961 Presence of intraocular lens: Secondary | ICD-10-CM

## 2019-09-28 DIAGNOSIS — H353211 Exudative age-related macular degeneration, right eye, with active choroidal neovascularization: Secondary | ICD-10-CM | POA: Diagnosis not present

## 2019-09-28 DIAGNOSIS — H04123 Dry eye syndrome of bilateral lacrimal glands: Secondary | ICD-10-CM | POA: Diagnosis not present

## 2019-09-28 DIAGNOSIS — H40013 Open angle with borderline findings, low risk, bilateral: Secondary | ICD-10-CM | POA: Diagnosis not present

## 2019-09-28 DIAGNOSIS — H353122 Nonexudative age-related macular degeneration, left eye, intermediate dry stage: Secondary | ICD-10-CM | POA: Diagnosis not present

## 2019-10-10 ENCOUNTER — Encounter: Payer: Self-pay | Admitting: Adult Health

## 2019-10-10 ENCOUNTER — Ambulatory Visit (INDEPENDENT_AMBULATORY_CARE_PROVIDER_SITE_OTHER): Payer: Medicare Other | Admitting: Adult Health

## 2019-10-10 ENCOUNTER — Other Ambulatory Visit: Payer: Self-pay

## 2019-10-10 VITALS — BP 136/58 | HR 95 | Temp 97.5°F | Ht 64.0 in | Wt 130.8 lb

## 2019-10-10 DIAGNOSIS — E785 Hyperlipidemia, unspecified: Secondary | ICD-10-CM

## 2019-10-10 DIAGNOSIS — R5382 Chronic fatigue, unspecified: Secondary | ICD-10-CM | POA: Diagnosis not present

## 2019-10-10 DIAGNOSIS — E611 Iron deficiency: Secondary | ICD-10-CM

## 2019-10-10 DIAGNOSIS — G4761 Periodic limb movement disorder: Secondary | ICD-10-CM

## 2019-10-10 DIAGNOSIS — E871 Hypo-osmolality and hyponatremia: Secondary | ICD-10-CM | POA: Diagnosis not present

## 2019-10-10 DIAGNOSIS — I10 Essential (primary) hypertension: Secondary | ICD-10-CM

## 2019-10-10 DIAGNOSIS — Z8673 Personal history of transient ischemic attack (TIA), and cerebral infarction without residual deficits: Secondary | ICD-10-CM

## 2019-10-10 MED ORDER — GABAPENTIN 300 MG PO CAPS: 600.0000 mg | ORAL_CAPSULE | Freq: Every day | ORAL | 3 refills | Status: DC

## 2019-10-10 NOTE — Progress Notes (Signed)
[facility] [address] [place]. [place] [address] [phone]       HOSPITAL FOLLOW UP NOTE  Tammy Allison Date of Birth:  06/04/1935 Medical Record Number:  [id number]   Reason for visit: TIA 04/25/2019    CHIEF COMPLAINT:  Chief Complaint  Patient presents with   Follow-up    Treatment room, Alone. Weak. States she gets short of breath easily.    HPI:  Stroke/TIA admission 04/25/2019: Tammy Allison is a 84 y.o. female with history of HTN and HLD  who presented to [facility] ED on 04/25/2019 with L sided weakness and numbness.  CT head negative for acute abnormality but did show old basal ganglia infarcts.  CTA head/neck showed left P2 high-grade stenosis but no evidence of LVO.  MRI negative for acute abnormalities but again showed evidence of dominant basal ganglia and hemispheric white matter ischemic changes.  2D echo unremarkable.  LDL 143 and A1c 5.3.  Initiated DAPT for 3 weeks and aspirin alone.  HTN stable.  Initiated Crestor as she declined atorvastatin for HLD management.  Other stroke risk factors include advanced age and prior history of stroke on imaging.  Initial visit 06/02/2019: Stable from neurological standpoint until 2 days ago when she was out shopping with her daughter and she started to a pulling sensation towards her right.  She denies any weakness, dizziness or lightheadedness sensation at that time.  She was able to continue walking and attempts to find a seat but ended up leaning on a counter until her daughter came to help her.  She was able to ambulate independently back into the car out from a store and sensation resolved after approximately 20 minutes.  She denies any associated neurological symptoms.  She also endorses generalized fatigue/weakness feeling which has been present over the past 6 to 12 months and believes this is due to continued change in blood pressure medications by cardiology.  She continues to be followed by Dr.  [name] who has attempted to encourage her to undergo sleep study but previously declined as she felt as though symptoms were due to medications.  She is willing at this time to undergo sleep study at this time.  She does endorse nighttime snoring, insomnia and excessive daytime fatigue.  Blood pressure has been stable with today's reading 137/64.  She continues on Crestor without myalgias.  Completed 3 weeks DAPT and continues on aspirin alone without bleeding or bruising.  She was initially doing home health therapy which has since been discontinued.  She continues to live with her husband and is able to maintain ADLs and IADLs independently.  No further concerns at this time.  *Upon evaluation/assessment, left hemiparesis noted along with left facial droop.  Upon further questioning, she does endorse noticing slight difficulty with raising left leg such as when she puts pants on.  She denies difficulty using left arm/hand.  She is right-hand dominant.  She has been experiencing increasing mouth breathing at night and will wake up with a dry mouth which she has not experienced previously.  She denies numbness/tingling or speech difficulty.  Update 08/09/2019: Tammy Allison is being seen today for stroke follow-up.  She has been stable from a neurological standpoint.  CT head obtained due to left-sided weakness but imaging unremarkable for acute etiology.  She did undergo sleep study on 06/24/2019 with difficulty sleeping with only 75 minutes of recorded sleep time but during that time, no evidence of sleep apnea.  Felt insomnia  was related to pain and PLM.  She continues to report insomnia with excessive daytime fatigue.  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  She has not noticed much benefit at this time.  She is overly frustrated with ongoing daytime fatigue.  She reports having no energy for daily functioning and can even interfere with ADL/IADLs.  She did have recent lab work done by PCP but is unsure of what  tests were performed.  She continues on aspirin and Crestor for secondary stroke prevention without side effects.  Blood pressure today stable.  No further concerns at this time.  Update 10/10/2019: Tammy Allison is a 84 year old female who is being seen today for stroke follow up.  She has been stable from a neurological standpoint with continued logic complaining of excessive daytime fatigue.  Prior sleep study did show evidence of PLM.  She was started on Mirapex back in September by PCP without great benefit.  Recommended initiation of gabapentin due to max dose of Mirapex and ongoing PLMD symptoms.  She has concerns regarding dyspnea on exertion potential side effect to gabapentin.  She was advised that this is typically not a side effect of gabapentin but can trial discontinuing.  She discontinued for 1 night and noticed a great difference of her sleep quality while off her gabapentin.  She restarted and discontinue Mirapex due to increased fatigue in the morning.  She does endorse improvement with use of gabapentin 3 mg nightly which she is sleeping more consistently throughout the night and feels improvement of her daytime fatigue.  She will continue to have occasional nights of insomnia and return will have increased daytime fatigue.  She has continued to experience dyspnea on exertion or with conversation but has not followed up with cardiology at this time.  Blood pressure today 136/58.  Reviewed test results from PCP which were largely unremarkable but iron level not obtained.  She does report recent lab work by PCP (1 to 2 months prior) with improvement of her sodium levels but she continues to be fluid restricted.  Unable to personally view results.  She remains on aspirin and Crestor for secondary stroke prevention without side effects. No further concerns at this time.        ROS:   14 system review of systems performed and negative with exception of fatigue  PMH:  Past Medical History:    Diagnosis Date   Arthritis    GERD (gastroesophageal reflux disease)    H/O hiatal hernia    Hyperlipidemia    Hypertension    Hypothyroidism    Irritable bowel syndrome    Macular degeneration    Exudative - OD    PSH:  Past Surgical History:  Procedure Laterality Date   CATARACT EXTRACTION     EYE SURGERY Bilateral    cataract surgery w/ lens implant   FRACTURE SURGERY     right hip   LUMBAR LAMINECTOMY/DECOMPRESSION MICRODISCECTOMY Left 05/25/2014   Procedure: LUMBAR LAMINECTOMY/DECOMPRESSION MICRODISCECTOMY 1 LEVEL   lumbar three;  Surgeon: Hosie Spangle, MD;  Location: [facility];  Service: Neurosurgery;  Laterality: Left;   THYROID SURGERY      Social History:  Social History   Socioeconomic History   Marital status: Married    Spouse name: Not on file   Number of children: Not on file   Years of education: Not on file   Highest education level: Not on file  Occupational History   Not on file  Social Needs  Financial resource strain: Not on file   Food insecurity    Worry: Not on file    Inability: Not on file   Transportation needs    Medical: Not on file    Non-medical: Not on file  Tobacco Use   Smoking status: Never Smoker   Smokeless tobacco: Never Used  Substance and Sexual Activity   Alcohol use: No   Drug use: Never   Sexual activity: Not on file  Lifestyle   Physical activity    Days per week: Not on file    Minutes per session: Not on file   Stress: Not on file  Relationships   Social connections    Talks on phone: Not on file    Gets together: Not on file    Attends religious service: Not on file    Active member of club or organization: Not on file    Attends meetings of clubs or organizations: Not on file    Relationship status: Not on file   Intimate partner violence    Fear of current or ex partner: Not on file    Emotionally abused: Not on file    Physically abused: Not on file    Forced  sexual activity: Not on file  Other Topics Concern   Not on file  Social History Narrative   Not on file    Family History:  Family History  Problem Relation Age of Onset   Macular degeneration Mother     Medications:   Current Outpatient Medications on File Prior to Visit  Medication Sig Dispense Refill   aspirin EC 81 MG EC tablet Take 1 tablet (81 mg total) by mouth daily.     cetirizine (ZYRTEC) 10 MG tablet Take 5 mg by mouth at bedtime.      cholecalciferol (VITAMIN D) 1000 UNITS tablet Take 1,000 Units by mouth every morning.      hydrALAZINE (APRESOLINE) 50 MG tablet Take 1 tablet (50 mg total) by mouth 3 (three) times daily. 270 tablet 3   levothyroxine (SYNTHROID) 88 MCG tablet      losartan (COZAAR) 100 MG tablet Take 1 tablet (100 mg total) by mouth daily.     pantoprazole (PROTONIX) 40 MG tablet Take 40 mg by mouth every morning.      Probiotic Product (ALIGN) 4 MG CAPS Take 4 mg by mouth daily.     rosuvastatin (CRESTOR) 5 MG tablet Take 1 tablet (5 mg total) by mouth at bedtime. 30 tablet 0   vitamin B-12 (CYANOCOBALAMIN) 100 MCG tablet Take 100 mcg by mouth daily.     pramipexole (MIRAPEX) 0.5 MG tablet TK 1 T PO QD     No current facility-administered medications on file prior to visit.     Allergies:   Allergies  Allergen Reactions   Sulfa Antibiotics Hives   Carvedilol Rash     Physical Exam  Vitals:   10/10/19 1016  BP: (!) 136/58  Pulse: 95  Temp: (!) 97.5 F (36.4 C)  Weight: 130 lb 12.8 oz (59.3 kg)  Height: 5\' 4"  (1.626 m)   Body mass index is 22.45 kg/m. No exam data present  General: well developed, well nourished,  pleasant elderly Caucasian female, seated, in no evident distress Head: head normocephalic and atraumatic.   Neck: supple with no carotid or supraclavicular bruits Cardiovascular: regular rate and rhythm, no murmurs Musculoskeletal: no deformity Skin:  no rash/petichiae Vascular:  Normal pulses all  extremities   Neurologic Exam Mental  Status: Awake and fully alert.   Normal speech/language.  Oriented to place and time. Recent and remote memory intact. Attention span, concentration and fund of knowledge appropriate. Mood and affect appropriate.  Cranial Nerves:  Pupils equal, briskly reactive to light. Extraocular movements full without nystagmus. Visual fields full to confrontation. Hearing intact. Facial sensation intact.   Motor: Normal bulk and tone.  Mild left hemiparesis Sensory.: intact to touch , pinpri ck , position and vibratory sensation.  Coordination: Mildly decreased finger tapping left hand.  No evidence of abnormality on right side Gait and Station: Arises from chair without difficulty. Stance is normal.  Gait abnormality with mild favoring of left leg Reflexes: 1+ and symmetric. Toes downgoing.      Diagnostic Data (Labs, Imaging, Testing)  CT HEAD WO CONTRAST 06/13/2019 IMPRESSION: This CT scan of the head without contrast shows the following: 1.  Chronic lacunar infarctions in the thalamus bilaterally and in the right basal ganglia.  These were also noted on the brain MRI from earlier this year. 2.  Chronic microvascular ischemic changes in the hemisphere, also seen on the previous brain MRI. 3.  Right mastoid effusion that could be due to eustachian tube dysfunction, also present on the recent brain MRI. 4.  There are no acute findings.    ASSESSMENT: Tammy Allison is a 84 y.o. year old female who presented to [facility] ED on 04/25/2019 with left-sided weakness and numbness with diagnosis of right brain TIA secondary to small vessel disease.  Vascular risk factors include HTN, HLD and prior stroke on imaging.  Mild left-sided weakness post stroke.  She underwent sleep study due to excessive daytime fatigue and insomnia which was negative for sleep apnea but did show evidence of PLMD.  Started on Mirapex by PCP with little benefit.  Initiated on gabapentin with  improvement of quality of sleep and daytime energy levels.    PLAN:  1. TIA: Continue aspirin 81 mg daily  and Crestor for secondary stroke prevention. Maintain strict control of hypertension with blood pressure goal below 130/90, diabetes with hemoglobin A1c goal below 6.5% and cholesterol with LDL cholesterol (bad cholesterol) goal below 70 mg/dL.  I also advised the patient to eat a healthy diet with plenty of whole grains, cereals, fruits and vegetables, exercise regularly with at least 30 minutes of continuous activity daily and maintain ideal body weight. 2. PLMD: Likely contributing to insomnia and daytime fatigue.  Recommend increasing gabapentin dose to 600 mg nightly but to notify office with any potential side effects.  Recommend obtaining ferritin level and repeat CMP 3. HTN: Advised to continue current treatment regimen.  Advised to continue to monitor at home along with continued follow-up with cardiology 4. HLD: Advised to continue current treatment regimen along with continued follow-up with PCP for future prescribing and monitoring of lipid panel 5. Complains today of dyspnea on exertion or with conversation and recommended scheduling sooner follow-up with cardiology   Follow up in 4 months or call earlier if needed   Greater than 50% of time during this 25 minute visit was spent on counseling, discussion regarding PLMD potentially etiology of insomnia and daytime fatigue, discussion regarding prior TIA and reviewing risk factor management of HTN and HLD, planning of further management along with potential future management, and discussion with patient answering all questions.    Frann Rider, AGNP-BC  [facility] [address] [place], [place] [address]  Phone [phone] Fax [phone] Note: This document was prepared with  digital dictation and possible smart Company secretary. Any transcriptional errors that result from this process  are unintentional.

## 2019-10-10 NOTE — Patient Instructions (Signed)
Increase gabapentin 300 mg to 600 mg nightly for continued improvement of daytime fatigue and periodic limb movement disorder.  We will also check your iron levels to ensure satisfactory levels.  Continue aspirin 81 mg daily  and Crestor for secondary stroke prevention  Continue to follow up with PCP regarding cholesterol and blood pressure management   Continue to monitor blood pressure at home  Maintain strict control of hypertension with blood pressure goal below 130/90, diabetes with hemoglobin A1c goal below 6.5% and cholesterol with LDL cholesterol (bad cholesterol) goal below 70 mg/dL. I also advised the patient to eat a healthy diet with plenty of whole grains, cereals, fruits and vegetables, exercise regularly and maintain ideal body weight.  Followup in the future with me in 4 months or call earlier if needed       Thank you for coming to see [place] at [facility]. I hope we have been able to provide you high quality care today.  You may receive a patient satisfaction survey over the next few weeks. We would appreciate your feedback and comments so that we may continue to improve ourselves and the health of our patients.

## 2019-10-11 ENCOUNTER — Other Ambulatory Visit: Payer: Self-pay | Admitting: Adult Health

## 2019-10-11 ENCOUNTER — Telehealth: Payer: Self-pay | Admitting: *Deleted

## 2019-10-11 DIAGNOSIS — G4761 Periodic limb movement disorder: Secondary | ICD-10-CM

## 2019-10-11 DIAGNOSIS — E611 Iron deficiency: Secondary | ICD-10-CM

## 2019-10-11 LAB — COMPREHENSIVE METABOLIC PANEL
ALT: 10 IU/L (ref 0–32)
AST: 14 IU/L (ref 0–40)
Albumin/Globulin Ratio: 1.6 (ref 1.2–2.2)
Albumin: 3.7 g/dL (ref 3.6–4.6)
Alkaline Phosphatase: 71 IU/L (ref 39–117)
BUN/Creatinine Ratio: 17 (ref 12–28)
BUN: 16 mg/dL (ref 8–27)
Bilirubin Total: 0.4 mg/dL (ref 0.0–1.2)
CO2: 25 mmol/L (ref 20–29)
Calcium: 9 mg/dL (ref 8.7–10.3)
Chloride: 97 mmol/L (ref 96–106)
Creatinine, Ser: 0.93 mg/dL (ref 0.57–1.00)
GFR calc Af Amer: 66 mL/min/{1.73_m2} (ref >59)
GFR calc non Af Amer: 57 mL/min/{1.73_m2} — ABNORMAL LOW (ref >59)
Globulin, Total: 2.3 g/dL (ref 1.5–4.5)
Glucose: 107 mg/dL — ABNORMAL HIGH (ref 65–99)
Potassium: 3.4 mmol/L — ABNORMAL LOW (ref 3.5–5.2)
Sodium: 135 mmol/L (ref 134–144)
Total Protein: 6 g/dL (ref 6.0–8.5)

## 2019-10-11 LAB — FERRITIN: Ferritin: 14 ng/mL — ABNORMAL LOW (ref 15–150)

## 2019-10-11 MED ORDER — FERROUS SULFATE 325 (65 FE) MG PO TBEC: 325.0000 mg | DELAYED_RELEASE_TABLET | Freq: Two times a day (BID) | ORAL | 3 refills | Status: DC

## 2019-10-11 MED ORDER — VITAMIN C 100 MG PO TABS: 100.0000 mg | ORAL_TABLET | Freq: Two times a day (BID) | ORAL | 3 refills | Status: DC

## 2019-10-11 NOTE — Telephone Encounter (Signed)
I called pt and relayed that her lab results per JM/NP showed low iron, she recommended taking ferrous sulfate 325mg  po BID and Vit C 100mg  po bid.  Can take both together.  Repeat level in 2 months.  I gave her M-TH 8-12 then 1-4 to lab draws. She verbalized understanding.  Lab order placed.

## 2019-10-11 NOTE — Telephone Encounter (Signed)
-----   Message from Frann Rider, NP sent at 10/11/2019  1:44 PM EST ----- ADDENDUM: Please advise patient to take ferrous sulfate and vitamin C supplement together (disregard 1 hour prior recommendation).  She can buy over-the-counter if cheaper than prescriptions sent to her pharmacy

## 2019-10-26 ENCOUNTER — Telehealth: Payer: Self-pay | Admitting: Adult Health

## 2019-10-26 ENCOUNTER — Other Ambulatory Visit: Payer: Self-pay | Admitting: Adult Health

## 2019-10-26 MED ORDER — GABAPENTIN 300 MG PO CAPS: 600.0000 mg | ORAL_CAPSULE | Freq: Every day | ORAL | 0 refills | Status: DC

## 2019-10-26 NOTE — Progress Notes (Signed)
Short refill of gabapentin 600 mg nightly placed as recently increased dosage but has not received new dosage from mail order pharmacy.

## 2019-10-26 NOTE — Telephone Encounter (Signed)
1) Medication(s) Requested (by name): gabapentin (NEURONTIN) 300 MG capsule   2) Pharmacy of Choice: [facility] on [address] 3) Special Requests:   Patient called to ask if she could be sent 8 capsules of gabapentin sent to her pharmacy states that she has not received her medication in the mail and has not been able to sleep at night and is also out of medication. Please follow up.

## 2019-10-26 NOTE — Telephone Encounter (Signed)
Drug registry checked.  No listing.  (getting out of state via Mail order).

## 2019-10-26 NOTE — Telephone Encounter (Signed)
Please advise patient that I sent 1 week of new dosage of gabapentin to her [place].  Please ensure she is reaching out to her mail order pharmacy to ensure they have received new dose prescription and will be receiving it by next week

## 2019-10-27 NOTE — Telephone Encounter (Signed)
  I called pt and relayed that JM/NP did renew for about 1 wk to get her by until her mail order gets to her.  She has been in touch, but it tells her delayed.   Hopefully she will get this soon.

## 2019-11-20 NOTE — Progress Notes (Signed)
Cardiology Office Note:    Date:  11/21/2019   ID:  Tammy Allison, DOB December 02, 1934, MRN [id number]  PCP:  [name], MD  Cardiologist:  Sinclair Grooms, MD   Referring MD: [name], MD   Chief Complaint  Patient presents with  . Coronary Artery Disease  . Chest Pain    History of Present Illness:    Tammy Allison is a 85 y.o. female with a hx of HTN, HLD, GERD, palpitations, TIA, and atypical chest pain.  She is doing okay.  She feels a lot better than she did last summer.  Not having chest pain.  Much more energy.  Has had a sleep study but results are not yet documented.  Past Medical History:  Diagnosis Date  . Arthritis   . GERD (gastroesophageal reflux disease)   . H/O hiatal hernia   . Hyperlipidemia   . Hypertension   . Hypothyroidism   . Irritable bowel syndrome   . Macular degeneration    Exudative - OD    Past Surgical History:  Procedure Laterality Date  . CATARACT EXTRACTION    . EYE SURGERY Bilateral    cataract surgery w/ lens implant  . FRACTURE SURGERY     right hip  . LUMBAR LAMINECTOMY/DECOMPRESSION MICRODISCECTOMY Left 05/25/2014   Procedure: LUMBAR LAMINECTOMY/DECOMPRESSION MICRODISCECTOMY 1 LEVEL   lumbar three;  Surgeon: Hosie Spangle, MD;  Location: [facility];  Service: Neurosurgery;  Laterality: Left;  . THYROID SURGERY      Current Medications: Current Meds  Medication Sig  . Ascorbic Acid (VITAMIN C) 100 MG tablet Take 1 tablet (100 mg total) by mouth 2 (two) times daily. Take 1 tablet twice daily with ferrous sulfate  . aspirin EC 81 MG EC tablet Take 1 tablet (81 mg total) by mouth daily.  . cetirizine (ZYRTEC) 10 MG tablet Take 5 mg by mouth at bedtime.   . cholecalciferol (VITAMIN D) 1000 UNITS tablet Take 1,000 Units by mouth every morning.   . ferrous sulfate 325 (65 FE) MG EC tablet Take 1 tablet (325 mg total) by mouth 2 (two) times daily.  Marland Kitchen gabapentin (NEURONTIN) 300 MG capsule Take 2 capsules (600 mg  total) by mouth at bedtime.  . hydrALAZINE (APRESOLINE) 50 MG tablet Take 1 tablet (50 mg total) by mouth 3 (three) times daily.  Marland Kitchen levothyroxine (SYNTHROID) 88 MCG tablet   . losartan (COZAAR) 100 MG tablet Take 1 tablet (100 mg total) by mouth daily.  . pantoprazole (PROTONIX) 40 MG tablet Take 40 mg by mouth every morning.   . Probiotic Product (ALIGN) 4 MG CAPS Take 4 mg by mouth daily.  . rosuvastatin (CRESTOR) 5 MG tablet Take 1 tablet (5 mg total) by mouth at bedtime.  . vitamin B-12 (CYANOCOBALAMIN) 100 MCG tablet Take 100 mcg by mouth daily.     Allergies:   Sulfa antibiotics and Carvedilol   Social History   Socioeconomic History  . Marital status: Married    Spouse name: Not on file  . Number of children: Not on file  . Years of education: Not on file  . Highest education level: Not on file  Occupational History  . Not on file  Tobacco Use  . Smoking status: Never Smoker  . Smokeless tobacco: Never Used  Substance and Sexual Activity  . Alcohol use: No  . Drug use: Never  . Sexual activity: Not on file  Other Topics Concern  . Not on file  Social History Narrative  . Not on file   Social Determinants of Health   Financial Resource Strain:   . Difficulty of Paying Living Expenses: Not on file  Food Insecurity:   . Worried About Charity fundraiser in the Last Year: Not on file  . Ran Out of Food in the Last Year: Not on file  Transportation Needs:   . Lack of Transportation (Medical): Not on file  . Lack of Transportation (Non-Medical): Not on file  Physical Activity:   . Days of Exercise per Week: Not on file  . Minutes of Exercise per Session: Not on file  Stress:   . Feeling of Stress : Not on file  Social Connections:   . Frequency of Communication with Friends and Family: Not on file  . Frequency of Social Gatherings with Friends and Family: Not on file  . Attends Religious Services: Not on file  . Active Member of Clubs or Organizations: Not on  file  . Attends Archivist Meetings: Not on file  . Marital Status: Not on file     Family History: The patient's family history includes Macular degeneration in her mother.  ROS:   Please see the history of present illness.    Iron levels are low.  Stools are dark from iron.  Causes GI upset.  All other systems reviewed and are negative.  EKGs/Labs/Other Studies Reviewed:    The following studies were reviewed today: Coronary CT with morphology: Performed June 2020 IMPRESSION: 1. Coronary calcium score of 192. This was 66 percentile for age and sex matched control.  2. Normal coronary origin with right dominance.  3. Minimal non-obstructive CAD. CAD RADS 1, medical management is recommended.  4. Dilated pulmonary artery measuring 34 mm suggestive of pulmonary hypertension.   Electronically Signed   By: Ena Dawley   On: 05/14/2019 12:11  SLEEP study August 2020: MPRESSION:   1. According to patient, pain induced insomnia.  2. Periodic Limb Movement Disorder (PLMD)   RECOMMENDATIONS:   1. Advise primary neurologist/ PCP to evaluate for back pain and  to optimize therapy. The PLMs are likely related.  2. There was no evidence of sleep related Apnea, hypoxia or  abnormal EEG.   3. The patient does not need to follow up in the sleep clinic.   EKG:  EKG no new data  Recent Labs: 04/25/2019: Hemoglobin 12.6; Platelets 214 04/26/2019: Magnesium 2.0 10/10/2019: ALT 10; BUN 16; Creatinine, Ser 0.93; Potassium 3.4; Sodium 135  Recent Lipid Panel    Component Value Date/Time   CHOL 116 06/15/2019 0848   TRIG 69 06/15/2019 0848   HDL 65 06/15/2019 0848   CHOLHDL 1.8 06/15/2019 0848   CHOLHDL 3.5 04/26/2019 0341   VLDL 15 04/26/2019 0341   LDLCALC 37 06/15/2019 0848    Physical Exam:    VS:  BP 132/72   Pulse 85   Ht 5\' 4"  (1.626 m)   Wt 128 lb 6.4 oz (58.2 kg)   SpO2 99%   BMI 22.04 kg/m     Wt Readings from Last 3 Encounters:    11/21/19 128 lb 6.4 oz (58.2 kg)  10/10/19 130 lb 12.8 oz (59.3 kg)  08/09/19 132 lb 12.8 oz (60.2 kg)     GEN: Healthy-appearing. No acute distress HEENT: Normal NECK: No JVD. LYMPHATICS: No lymphadenopathy CARDIAC:  RRR without murmur, gallop, or edema. VASCULAR:  Normal Pulses. No bruits. RESPIRATORY:  Clear to auscultation without rales, wheezing or rhonchi  ABDOMEN: Soft, non-tender, non-distended, No pulsatile mass, MUSCULOSKELETAL: No deformity  SKIN: Warm and dry NEUROLOGIC:  Alert and oriented x 3 PSYCHIATRIC:  Normal affect   ASSESSMENT:    1. Chest pain of uncertain etiology   2. Hyperlipidemia LDL goal <100   3. Benign essential hypertension   4. TIA (transient ischemic attack)   5. Snoring   6. Educated about COVID-19 virus infection    PLAN:    In order of problems listed above:  1. Resolved.  Nonobstructive coronary disease by CT.  Preventive therapy recommended. 2. Most recent LDL was 77 3. Blood pressure is under excellent control on current therapy.  130/80 mmHg is target. 4. She had left arm and leg weakness that was transient.  No recurrence since this occurred in June 2020. 5. Sleep study result as outlined above.  Periodic limb movement disorder but no evidence of sleep apnea. 6. 3W's is being observed to avoid COVID-19 infection.    Medication Adjustments/Labs and Tests Ordered: Current medicines are reviewed at length with the patient today.  Concerns regarding medicines are outlined above.  No orders of the defined types were placed in this encounter.  No orders of the defined types were placed in this encounter.   Patient Instructions  Medication Instructions:  Your physician recommends that you continue on your current medications as directed. Please refer to the Current Medication list given to you today.  *If you need a refill on your cardiac medications before your next appointment, please call your pharmacy*  Lab Work: None If  you have labs (blood work) drawn today and your tests are completely normal, you will receive your results only by: Marland Kitchen MyChart Message (if you have MyChart) OR . A paper copy in the mail If you have any lab test that is abnormal or we need to change your treatment, we will call you to review the results.  Testing/Procedures: None  Follow-Up: At [facility], you and your health needs are our priority.  As part of our continuing mission to provide you with exceptional heart care, we have created designated Provider Care Teams.  These Care Teams include your primary Cardiologist (physician) and Advanced Practice Providers (APPs -  Physician Assistants and Nurse Practitioners) who all work together to provide you with the care you need, when you need it.  Your next appointment:   As needed   The format for your next appointment:   In Person  Provider:   You may see Sinclair Grooms, MD or one of the following Advanced Practice Providers on your designated Care Team:    Truitt Merle, NP  Cecilie Kicks, NP  Kathyrn Drown, NP   Other Instructions      Signed, Sinclair Grooms, MD  11/21/2019 11:28 AM    [place]

## 2019-11-21 ENCOUNTER — Other Ambulatory Visit: Payer: Self-pay

## 2019-11-21 ENCOUNTER — Encounter: Payer: Self-pay | Admitting: Interventional Cardiology

## 2019-11-21 ENCOUNTER — Ambulatory Visit (INDEPENDENT_AMBULATORY_CARE_PROVIDER_SITE_OTHER): Payer: Medicare Other | Admitting: Interventional Cardiology

## 2019-11-21 VITALS — BP 132/72 | HR 85 | Ht 64.0 in | Wt 128.4 lb

## 2019-11-21 DIAGNOSIS — R079 Chest pain, unspecified: Secondary | ICD-10-CM

## 2019-11-21 DIAGNOSIS — I10 Essential (primary) hypertension: Secondary | ICD-10-CM

## 2019-11-21 DIAGNOSIS — Z7189 Other specified counseling: Secondary | ICD-10-CM

## 2019-11-21 DIAGNOSIS — R0683 Snoring: Secondary | ICD-10-CM

## 2019-11-21 DIAGNOSIS — G459 Transient cerebral ischemic attack, unspecified: Secondary | ICD-10-CM | POA: Diagnosis not present

## 2019-11-21 DIAGNOSIS — E785 Hyperlipidemia, unspecified: Secondary | ICD-10-CM | POA: Diagnosis not present

## 2019-11-21 NOTE — Patient Instructions (Signed)
Medication Instructions:  Your physician recommends that you continue on your current medications as directed. Please refer to the Current Medication list given to you today.  *If you need a refill on your cardiac medications before your next appointment, please call your pharmacy*  Lab Work: None If you have labs (blood work) drawn today and your tests are completely normal, you will receive your results only by: . MyChart Message (if you have MyChart) OR . A paper copy in the mail If you have any lab test that is abnormal or we need to change your treatment, we will call you to review the results.  Testing/Procedures: None  Follow-Up: At [facility], you and your health needs are our priority.  As part of our continuing mission to provide you with exceptional heart care, we have created designated Provider Care Teams.  These Care Teams include your primary Cardiologist (physician) and Advanced Practice Providers (APPs -  Physician Assistants and Nurse Practitioners) who all work together to provide you with the care you need, when you need it.  Your next appointment:   As needed   The format for your next appointment:   In Person  Provider:   You may see Henry W Smith III, MD or one of the following Advanced Practice Providers on your designated Care Team:    Lori Gerhardt, NP  Laura Ingold, NP  Jill McDaniel, NP   Other Instructions   

## 2019-12-10 ENCOUNTER — Other Ambulatory Visit: Payer: Self-pay | Admitting: Adult Health

## 2019-12-14 ENCOUNTER — Other Ambulatory Visit: Payer: Self-pay

## 2019-12-14 ENCOUNTER — Other Ambulatory Visit (INDEPENDENT_AMBULATORY_CARE_PROVIDER_SITE_OTHER): Payer: Self-pay

## 2019-12-14 DIAGNOSIS — G4761 Periodic limb movement disorder: Secondary | ICD-10-CM

## 2019-12-14 DIAGNOSIS — E611 Iron deficiency: Secondary | ICD-10-CM

## 2019-12-14 DIAGNOSIS — Z0289 Encounter for other administrative examinations: Secondary | ICD-10-CM

## 2019-12-15 ENCOUNTER — Telehealth: Payer: Self-pay | Admitting: *Deleted

## 2019-12-15 LAB — FERRITIN: Ferritin: 32 ng/mL (ref 15–150)

## 2019-12-15 NOTE — Telephone Encounter (Signed)
Spoke with patient and informed her that her repeat ferritin level has improved and to continue current regimen. We will repeat levels at follow-up visit. Patient verbalized understanding, appreciation.

## 2020-01-11 ENCOUNTER — Other Ambulatory Visit: Payer: Self-pay | Admitting: Interventional Cardiology

## 2020-01-11 ENCOUNTER — Other Ambulatory Visit: Payer: Self-pay | Admitting: Adult Health

## 2020-01-17 NOTE — Progress Notes (Signed)
[facility] Note  01/20/2020     CHIEF COMPLAINT Patient presents for Retina Follow Up   HISTORY OF PRESENT ILLNESS: Tammy Allison is a 85 y.o. female who presents to the clinic today for:   HPI    Retina Follow Up    Patient presents with  Wet AMD.  In right eye.  This started weeks ago.  Severity is moderate.  Duration of weeks.  Since onset it is stable.  I, the attending physician,  performed the HPI with the patient and updated documentation appropriately.          Comments    Patient states her vision is about the same.  She denies eye pain or discomfort and denies any new or worsening floaters or fol OU.       Last edited by Bernarda Caffey, MD on 01/20/2020 10:36 AM. (History)    pt states she is doing well   Referring physician: [name], MD [place],  [place] [id number]  HISTORICAL INFORMATION:   Selected notes from the MEDICAL RECORD NUMBER Referred by Dr. Quentin Ore for exu ARMD OD    CURRENT MEDICATIONS: No current outpatient medications on file. (Ophthalmic Drugs)   No current facility-administered medications for this visit. (Ophthalmic Drugs)   Current Outpatient Medications (Other)  Medication Sig  . Ascorbic Acid (VITAMIN C) 100 MG tablet Take 1 tablet (100 mg total) by mouth 2 (two) times daily. Take 1 tablet twice daily with ferrous sulfate  . aspirin EC 81 MG EC tablet Take 1 tablet (81 mg total) by mouth daily.  . cetirizine (ZYRTEC) 10 MG tablet Take 5 mg by mouth at bedtime.   . cholecalciferol (VITAMIN D) 1000 UNITS tablet Take 1,000 Units by mouth every morning.   . ferrous sulfate 325 (65 FE) MG EC tablet Take 1 tablet (325 mg total) by mouth 2 (two) times daily.  Marland Kitchen gabapentin (NEURONTIN) 300 MG capsule TAKE 2 CAPSULES BY MOUTH AT BEDTIME  . hydrALAZINE (APRESOLINE) 50 MG tablet TAKE 1 TABLET BY MOUTH 3  TIMES DAILY  . levothyroxine (SYNTHROID) 88 MCG tablet   . losartan (COZAAR) 100 MG  tablet Take 1 tablet (100 mg total) by mouth daily.  . pantoprazole (PROTONIX) 40 MG tablet Take 40 mg by mouth every morning.   . Probiotic Product (ALIGN) 4 MG CAPS Take 4 mg by mouth daily.  . rosuvastatin (CRESTOR) 5 MG tablet Take 1 tablet (5 mg total) by mouth at bedtime.  . vitamin B-12 (CYANOCOBALAMIN) 100 MCG tablet Take 100 mcg by mouth daily.   No current facility-administered medications for this visit. (Other)      REVIEW OF SYSTEMS: ROS    Positive for: Neurological, Eyes   Negative for: Constitutional, Gastrointestinal, Skin, Genitourinary, Musculoskeletal, HENT, Endocrine, Cardiovascular, Respiratory, Psychiatric, Allergic/Imm, Heme/Lymph   Last edited by Doneen Poisson on 01/20/2020 10:12 AM. (History)       ALLERGIES Allergies  Allergen Reactions  . Sulfa Antibiotics Hives  . Carvedilol Rash    PAST MEDICAL HISTORY Past Medical History:  Diagnosis Date  . Arthritis   . GERD (gastroesophageal reflux disease)   . H/O hiatal hernia   . Hyperlipidemia   . Hypertension   . Hypothyroidism   . Irritable bowel syndrome   . Macular degeneration    Exudative - OD   Past Surgical History:  Procedure Laterality Date  . CATARACT EXTRACTION    . EYE SURGERY Bilateral  cataract surgery w/ lens implant  . FRACTURE SURGERY     right hip  . LUMBAR LAMINECTOMY/DECOMPRESSION MICRODISCECTOMY Left 05/25/2014   Procedure: LUMBAR LAMINECTOMY/DECOMPRESSION MICRODISCECTOMY 1 LEVEL   lumbar three;  Surgeon: Hosie Spangle, MD;  Location: [facility];  Service: Neurosurgery;  Laterality: Left;  . THYROID SURGERY      FAMILY HISTORY Family History  Problem Relation Age of Onset  . Macular degeneration Mother     SOCIAL HISTORY Social History   Tobacco Use  . Smoking status: Never Smoker  . Smokeless tobacco: Never Used  Substance Use Topics  . Alcohol use: No  . Drug use: Never         OPHTHALMIC EXAM:  Base Eye Exam    Visual Acuity (Snellen -  Linear)      Right Left   Dist cc 20/60 20/30 +1   Dist ph cc 20/50 NI       Tonometry (Tonopen, 10:20 AM)      Right Left   Pressure 11 10       Pupils      Dark Light Shape React APD   Right 2 1 Round Minimal 0   Left 3 2 Round Minimal 0       Visual Fields      Left Right    Full Full       Extraocular Movement      Right Left    Full Full       Neuro/Psych    Oriented x3: Yes   Mood/Affect: Normal       Dilation    Both eyes: 1.0% Mydriacyl, 2.5% Phenylephrine @ 10:20 AM        Slit Lamp and Fundus Exam    Slit Lamp Exam      Right Left   Lids/Lashes Dermatochalasis - upper lid Dermatochalasis - upper lid   Conjunctiva/Sclera White and quiet White and quiet   Cornea Arcus, 1+ Punctate epithelial erosions, mild Debris in tear film Arcus, 1+ Punctate epithelial erosions, trace Debris in tear film   Anterior Chamber Deep and quiet Deep and quiet   Iris Round and dilated Round and dilated   Lens PC IOL in good position with open PC PC IOL in good position with open PC   Vitreous Vitreous syneresis, Posterior vitreous detachment Vitreous syneresis, Posterior vitreous detachment, vitreous condensations       Fundus Exam      Right Left   Disc Sharp rim, mild Pallor sharp rim, trace pallor, Tilted disc, temporal Peripapillary atrophy   C/D Ratio 0.4 0.4   Macula Flat, blunted foveal reflex, Large central PEDs with interval resolution of SRF overlying -- slight increase in PED size, pigment mottling and clumping, Drusen, no heme flat, Blunted foveal reflex, +PED, Drusen, Retinal pigment epithelial mottling and clumping, no heme   Vessels Vascular attenuation, Tortuous, AV crossing changes Vascular attenuation, Tortuous, AV crossing changes, Copper wiring   Periphery Attached, no heme Attached, no heme        Refraction    Wearing Rx      Sphere Cylinder Axis Add   Right -0.75 +0.50 089 +2.50   Left -0.75 Sphere  +2.50   Type: Bifocal          IMAGING  AND PROCEDURES  Imaging and Procedures for @TODAY @  OCT, Retina - OU - Both Eyes       Right Eye Quality was good. Central Foveal Thickness: 433. Progression has no  prior data. Findings include abnormal foveal contour, no IRF, pigment epithelial detachment, retinal drusen , outer retinal atrophy, no SRF (Interval increase in PED size, interval improvement in SRF).   Left Eye Quality was good. Central Foveal Thickness: 287. Progression has been stable. Findings include normal foveal contour, pigment epithelial detachment, no IRF, no SRF, retinal drusen  (Stable from prior).   Notes *Images captured and stored on drive  Diagnosis / Impression:  OD: Exudative ARMD -- Interval increase in PED size; interval improvement in SRF OS: Non-Exudative ARMD w/ stable PEDs -- stable from prior  Clinical management:  See below  Abbreviations: NFP - Normal foveal profile. CME - cystoid macular edema. PED - pigment epithelial detachment. IRF - intraretinal fluid. SRF - subretinal fluid. EZ - ellipsoid zone. ERM - epiretinal membrane. ORA - outer retinal atrophy. ORT - outer retinal tubulation. SRHM - subretinal hyper-reflective material                  ASSESSMENT/PLAN:    ICD-10-CM   1. Exudative age-related macular degeneration of right eye with active choroidal neovascularization ([place])  H35.3211   2. 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 Retinal edema  H35.81 OCT, Retina - OU - Both Eyes  4. Posterior vitreous detachment of both eyes  H43.813   5. Pseudophakia of both eyes  Z96.1   6. PCO (posterior capsular opacification), bilateral  H26.493   7. Dry eyes  H04.123     1,3. Exudative age related macular degeneration, OD              - initial BCVA OD 20/30             - initial OCT w/ PEDs OU (OD > OS); mild SRF overlying PED OD  - initial FA with mild CNVM OD  - S/P IVA OD #1 (07.12.19), #2 (08.12.19), #3 (09.09.19)  - S/P  IVE #1 OD (10.07.19), #2 (11.04.19), #3 (12.02.19), #4 (12.30.19), #5 (01.27.20), #6 (03.09.20), #7 (05.04.20)  - BCVA stable at 20/50   - OCT shows interval improvement in SRF, but slight increase in PED size  - no IVE since May 2020  - review of prior OCTs shows no significant change in SRF overlying PEDs with or without anti-VEGF therapy  - recommend holding off on IVE  - [place] paperwork signed and benefits investigation started on 09.09.19 -- approved for Good Days 2021  - f/u in 6-8 wks, sooner PRN  2. Age related macular degeneration, non-exudative, OS  - The incidence, anatomy, and pathology of dry AMD, risk of progression, and the AREDS and AREDS 2 study including smoking risks discussed with patient  - continue amsler grid monitoring  4. PVD / vitreous syneresis OU  - Discussed findings and prognosis  - No RT or RD on 360 scleral depressed exam  - Reviewed s/s of RT/RD  - Strict return precautions for any such RT/RD signs/symptoms  5. Pseudophakia OU  - s/p CE/IOL OU  - beautiful surgeries, doing well  - monitor  6. PCO OU (OS > OD)  - s/p YAG cap OU (09/2018) w/ Dr. Kathlen Mody  - doing well, VA improved OU  7. Dry eyes OU - recommend artificial tears and lubricating ointment as needed    Ophthalmic Meds Ordered this visit:  No orders of the defined types were placed in this encounter.      Return in about 6 weeks (around 03/02/2020) for 6-8 wk  f/u for exu ARMD OD w/DFE & OCT OU.  There are no Patient Instructions on file for this visit.   Explained the diagnoses, plan, and follow up with the patient and they expressed understanding.  Patient expressed understanding of the importance of proper follow up care.   This document serves as a record of services personally performed by Gardiner Sleeper, MD, PhD. It was created on their behalf by Ernest Mallick, OA, an ophthalmic assistant. The creation of this record is the provider's dictation and/or activities during the  visit.    Electronically signed by: Ernest Mallick, OA 03.02.2021 11:15 PM   Gardiner Sleeper, M.D., Ph.D. Diseases & Surgery of the Retina and Vitreous [facility]  I have reviewed the above documentation for accuracy and completeness, and I agree with the above. Gardiner Sleeper, M.D., Ph.D. 01/21/20 11:15 PM    Abbreviations: M myopia (nearsighted); A astigmatism; H hyperopia (farsighted); P presbyopia; Mrx spectacle prescription;  CTL contact lenses; OD right eye; OS left eye; OU both eyes  XT exotropia; ET esotropia; PEK punctate epithelial keratitis; PEE punctate epithelial erosions; DES dry eye syndrome; MGD meibomian gland dysfunction; ATs artificial tears; PFAT's preservative free artificial tears; [place] nuclear sclerotic cataract; PSC posterior subcapsular cataract; ERM epi-retinal membrane; PVD posterior vitreous detachment; RD retinal detachment; DM diabetes mellitus; DR diabetic retinopathy; NPDR non-proliferative diabetic retinopathy; PDR proliferative diabetic retinopathy; CSME clinically significant macular edema; DME diabetic macular edema; dbh dot blot hemorrhages; CWS cotton wool spot; POAG primary open angle glaucoma; C/D cup-to-disc ratio; HVF humphrey visual field; GVF goldmann visual field; OCT optical coherence tomography; IOP intraocular pressure; BRVO Branch retinal vein occlusion; CRVO central retinal vein occlusion; CRAO central retinal artery occlusion; BRAO branch retinal artery occlusion; RT retinal tear; SB scleral buckle; PPV pars plana vitrectomy; VH Vitreous hemorrhage; PRP panretinal laser photocoagulation; IVK intravitreal kenalog; VMT vitreomacular traction; MH Macular hole;  NVD neovascularization of the disc; NVE neovascularization elsewhere; AREDS age related eye disease study; ARMD age related macular degeneration; POAG primary open angle glaucoma; EBMD epithelial/anterior basement membrane dystrophy; ACIOL anterior chamber intraocular lens;  IOL intraocular lens; PCIOL posterior chamber intraocular lens; Phaco/IOL phacoemulsification with intraocular lens placement; [place] photorefractive keratectomy; LASIK laser assisted in situ keratomileusis; HTN hypertension; DM diabetes mellitus; COPD chronic obstructive pulmonary disease

## 2020-01-20 ENCOUNTER — Other Ambulatory Visit: Payer: Self-pay

## 2020-01-20 ENCOUNTER — Ambulatory Visit (INDEPENDENT_AMBULATORY_CARE_PROVIDER_SITE_OTHER): Payer: Medicare Other | Admitting: Ophthalmology

## 2020-01-20 DIAGNOSIS — Z961 Presence of intraocular lens: Secondary | ICD-10-CM

## 2020-01-20 DIAGNOSIS — H43813 Vitreous degeneration, bilateral: Secondary | ICD-10-CM

## 2020-01-20 DIAGNOSIS — H353211 Exudative age-related macular degeneration, right eye, with active choroidal neovascularization: Secondary | ICD-10-CM

## 2020-01-20 DIAGNOSIS — H3581 Retinal edema: Secondary | ICD-10-CM

## 2020-01-20 DIAGNOSIS — H04123 Dry eye syndrome of bilateral lacrimal glands: Secondary | ICD-10-CM

## 2020-01-20 DIAGNOSIS — H353122 Nonexudative age-related macular degeneration, left eye, intermediate dry stage: Secondary | ICD-10-CM

## 2020-01-20 DIAGNOSIS — H26493 Other secondary cataract, bilateral: Secondary | ICD-10-CM

## 2020-01-21 ENCOUNTER — Encounter (INDEPENDENT_AMBULATORY_CARE_PROVIDER_SITE_OTHER): Payer: Self-pay | Admitting: Ophthalmology

## 2020-02-07 ENCOUNTER — Other Ambulatory Visit: Payer: Self-pay

## 2020-02-07 ENCOUNTER — Encounter: Payer: Self-pay | Admitting: Adult Health

## 2020-02-07 ENCOUNTER — Ambulatory Visit: Payer: Medicare Other | Admitting: Adult Health

## 2020-02-07 VITALS — BP 137/70 | HR 84 | Temp 96.8°F | Ht 64.0 in | Wt 124.8 lb

## 2020-02-07 DIAGNOSIS — E785 Hyperlipidemia, unspecified: Secondary | ICD-10-CM

## 2020-02-07 DIAGNOSIS — I10 Essential (primary) hypertension: Secondary | ICD-10-CM

## 2020-02-07 DIAGNOSIS — Z8673 Personal history of transient ischemic attack (TIA), and cerebral infarction without residual deficits: Secondary | ICD-10-CM

## 2020-02-07 DIAGNOSIS — G4761 Periodic limb movement disorder: Secondary | ICD-10-CM | POA: Diagnosis not present

## 2020-02-07 MED ORDER — GABAPENTIN 300 MG PO CAPS: 900.0000 mg | ORAL_CAPSULE | Freq: Every day | ORAL | 3 refills | Status: DC

## 2020-02-07 NOTE — Patient Instructions (Signed)
Continue gabapentin 900mg  nightly to help with sleep and periodic limb movement disorder  Continue aspirin 81 mg daily  and Crestor  for secondary stroke prevention  Continue to follow up with PCP regarding cholesterol and blood pressure management   Continue to monitor blood pressure at home  Maintain strict control of hypertension with blood pressure goal below 130/90, diabetes with hemoglobin A1c goal below 6.5% and cholesterol with LDL cholesterol (bad cholesterol) goal below 70 mg/dL. I also advised the patient to eat a healthy diet with plenty of whole grains, cereals, fruits and vegetables, exercise regularly and maintain ideal body weight.  Followup in the future with me in 6 months or call earlier if needed       Thank you for coming to see [place] at [facility]. I hope we have been able to provide you high quality care today.  You may receive a patient satisfaction survey over the next few weeks. We would appreciate your feedback and comments so that we may continue to improve ourselves and the health of our patients.

## 2020-02-07 NOTE — Progress Notes (Signed)
[facility] [address] [place] [address] [phone]       OFFICE FOLLOW UP NOTE  [name] Date of Birth:  1935-10-19 Medical Record Number:  [id number]   Reason for visit: TIA 04/25/2019    CHIEF COMPLAINT:  Chief Complaint  Patient presents with  . Follow-up    Stroke follow up pt now taking 3  gabapentin at qhs she could not sleep pcp recomemnd it and told her to follow up with our ofifice pt alone    HPI:   Tammy Allison is a 85 year old female who is being seen today, 02/07/2020, for follow-up regarding history of stroke and PLMD and insomnia symptoms.  She has been stable from a stroke standpoint since prior visit.  Continues on a if you see continued weight okay she states she was going in there that spirin 81 mg daily and Crestor 5 mg daily for secondary stroke prevention without side effects.  She did have recent lab work by PCP including lipid panel but not able to personally review through epic.  Blood pressure today 137/70.  Prior sleep study for excessive fatigue and insomnia negative for sleep apnea but did show PLMD possibly contributing to insomnia and daytime fatigue.  Ferritin level obtained at prior visit at 14 therefore ferrous sulfate 325 mg daily and vitamin C initiated.  38-month repeat showed improvement of ferritin at 32.  Unfortunately, she self discontinued approximately 1 week ago due to side effects of constipation, nausea and change in taste.  She does report improvement of symptoms since discontinuing.  She did not notice any benefit in regards to her symptoms.  Gabapentin dosage also increased after prior visit currently on 600 mg nightly and again increase last week by PCP currently 900 mg nightly.  She does report improvement of insomnia sleeping more consistently throughout the night which has improved prior daytime fatigue.  Recent lab work by PCP included repeat ferritin and TSH but unable to personally review results.   No further concerns at this time.     History Provided for reference purposes only Stroke/TIA admission 04/25/2019: Tammy Allison is a 85 y.o. female with history of HTN and HLD  who presented to [facility] ED on 04/25/2019 with L sided weakness and numbness.  CT head negative for acute abnormality but did show old basal ganglia infarcts.  CTA head/neck showed left P2 high-grade stenosis but no evidence of LVO.  MRI negative for acute abnormalities but again showed evidence of dominant basal ganglia and hemispheric white matter ischemic changes.  2D echo unremarkable.  LDL 143 and A1c 5.3.  Initiated DAPT for 3 weeks and aspirin alone.  HTN stable.  Initiated Crestor as she declined atorvastatin for HLD management.  Other stroke risk factors include advanced age and prior history of stroke on imaging.  Initial visit 06/02/2019: Stable from neurological standpoint until 2 days ago when she was out shopping with her daughter and she started to a pulling sensation towards her right.  She denies any weakness, dizziness or lightheadedness sensation at that time.  She was able to continue walking and attempts to find a seat but ended up leaning on a counter until her daughter came to help her.  She was able to ambulate independently back into the car out from a store and sensation resolved after approximately 20 minutes.  She denies any associated neurological symptoms.  She also endorses generalized fatigue/weakness feeling which has been present over the past 6  to 12 months and believes this is due to continued change in blood pressure medications by cardiology.  She continues to be followed by Dr. [name] who has attempted to encourage her to undergo sleep study but previously declined as she felt as though symptoms were due to medications.  She is willing at this time to undergo sleep study at this time.  She does endorse nighttime snoring, insomnia and excessive daytime fatigue.  Blood pressure has been stable  with today's reading 137/64.  She continues on Crestor without myalgias.  Completed 3 weeks DAPT and continues on aspirin alone without bleeding or bruising.  She was initially doing home health therapy which has since been discontinued.  She continues to live with her husband and is able to maintain ADLs and IADLs independently.  No further concerns at this time.  *Upon evaluation/assessment, left hemiparesis noted along with left facial droop.  Upon further questioning, she does endorse noticing slight difficulty with raising left leg such as when she puts pants on.  She denies difficulty using left arm/hand.  She is right-hand dominant.  She has been experiencing increasing mouth breathing at night and will wake up with a dry mouth which she has not experienced previously.  She denies numbness/tingling or speech difficulty.  Update 08/09/2019: Tammy Allison is being seen today for stroke follow-up.  She has been stable from a neurological standpoint.  CT head obtained due to left-sided weakness but imaging unremarkable for acute etiology.  She did undergo sleep study on 06/24/2019 with difficulty sleeping with only 75 minutes of recorded sleep time but during that time, no evidence of sleep apnea.  Felt insomnia was related to pain and PLM.  She continues to report insomnia with excessive daytime fatigue.  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  She has not noticed much benefit at this time.  She is overly frustrated with ongoing daytime fatigue.  She reports having no energy for daily functioning and can even interfere with ADL/IADLs.  She did have recent lab work done by PCP but is unsure of what tests were performed.  She continues on aspirin and Crestor for secondary stroke prevention without side effects.  Blood pressure today stable.  No further concerns at this time.  Update 10/10/2019: Tammy Allison is a 85 year old female who is being seen today for stroke follow up.  She has been stable from a  neurological standpoint with continued logic complaining of excessive daytime fatigue.  Prior sleep study did show evidence of PLM.  She was started on Mirapex back in September by PCP without great benefit.  Recommended initiation of gabapentin due to max dose of Mirapex and ongoing PLMD symptoms.  She has concerns regarding dyspnea on exertion potential side effect to gabapentin.  She was advised that this is typically not a side effect of gabapentin but can trial discontinuing.  She discontinued for 1 night and noticed a great difference of her sleep quality while off her gabapentin.  She restarted and discontinue Mirapex due to increased fatigue in the morning.  She does endorse improvement with use of gabapentin 3 mg nightly which she is sleeping more consistently throughout the night and feels improvement of her daytime fatigue.  She will continue to have occasional nights of insomnia and return will have increased daytime fatigue.  She has continued to experience dyspnea on exertion or with conversation but has not followed up with cardiology at this time.  Blood pressure today 136/58.  Reviewed test results from  PCP which were largely unremarkable but iron level not obtained.  She does report recent lab work by PCP (1 to 2 months prior) with improvement of her sodium levels but she continues to be fluid restricted.  Unable to personally view results.  She remains on aspirin and Crestor for secondary stroke prevention without side effects. No further concerns at this time.       ROS:   14 system review of systems performed and negative with exception of fatigue and insomnia  PMH:  Past Medical History:  Diagnosis Date  . Arthritis   . GERD (gastroesophageal reflux disease)   . H/O hiatal hernia   . Hyperlipidemia   . Hypertension   . Hypothyroidism   . Irritable bowel syndrome   . Macular degeneration    Exudative - OD    PSH:  Past Surgical History:  Procedure Laterality Date  .  CATARACT EXTRACTION    . EYE SURGERY Bilateral    cataract surgery w/ lens implant  . FRACTURE SURGERY     right hip  . LUMBAR LAMINECTOMY/DECOMPRESSION MICRODISCECTOMY Left 05/25/2014   Procedure: LUMBAR LAMINECTOMY/DECOMPRESSION MICRODISCECTOMY 1 LEVEL   lumbar three;  Surgeon: Hosie Spangle, MD;  Location: [facility];  Service: Neurosurgery;  Laterality: Left;  . THYROID SURGERY      Social History:  Social History   Socioeconomic History  . Marital status: Married    Spouse name: Not on file  . Number of children: Not on file  . Years of education: Not on file  . Highest education level: Not on file  Occupational History  . Not on file  Tobacco Use  . Smoking status: Never Smoker  . Smokeless tobacco: Never Used  Substance and Sexual Activity  . Alcohol use: No  . Drug use: Never  . Sexual activity: Not on file  Other Topics Concern  . Not on file  Social History Narrative  . Not on file   Social Determinants of Health   Financial Resource Strain:   . Difficulty of Paying Living Expenses:   Food Insecurity:   . Worried About Charity fundraiser in the Last Year:   . Arboriculturist in the Last Year:   Transportation Needs:   . Film/video editor (Medical):   Marland Kitchen Lack of Transportation (Non-Medical):   Physical Activity:   . Days of Exercise per Week:   . Minutes of Exercise per Session:   Stress:   . Feeling of Stress :   Social Connections:   . Frequency of Communication with Friends and Family:   . Frequency of Social Gatherings with Friends and Family:   . Attends Religious Services:   . Active Member of Clubs or Organizations:   . Attends Archivist Meetings:   Marland Kitchen Marital Status:   Intimate Partner Violence:   . Fear of Current or Ex-Partner:   . Emotionally Abused:   Marland Kitchen Physically Abused:   . Sexually Abused:     Family History:  Family History  Problem Relation Age of Onset  . Macular degeneration Mother     Medications:     Current Outpatient Medications on File Prior to Visit  Medication Sig Dispense Refill  . Ascorbic Acid (VITAMIN C) 100 MG tablet Take 1 tablet (100 mg total) by mouth 2 (two) times daily. Take 1 tablet twice daily with ferrous sulfate 60 tablet 3  . aspirin EC 81 MG EC tablet Take 1 tablet (81 mg total)  by mouth daily.    . cetirizine (ZYRTEC) 10 MG tablet Take 5 mg by mouth at bedtime.     . cholecalciferol (VITAMIN D) 1000 UNITS tablet Take 1,000 Units by mouth every morning.     . gabapentin (NEURONTIN) 300 MG capsule TAKE 2 CAPSULES BY MOUTH AT BEDTIME (Patient taking differently: Take 3 at bedtime change was made by PCP) 60 capsule 11  . hydrALAZINE (APRESOLINE) 50 MG tablet TAKE 1 TABLET BY MOUTH 3  TIMES DAILY 270 tablet 3  . levothyroxine (SYNTHROID) 88 MCG tablet     . losartan (COZAAR) 100 MG tablet Take 1 tablet (100 mg total) by mouth daily.    . pantoprazole (PROTONIX) 40 MG tablet Take 40 mg by mouth every morning.     . Probiotic Product (ALIGN) 4 MG CAPS Take 4 mg by mouth daily.    . rosuvastatin (CRESTOR) 5 MG tablet Take 1 tablet (5 mg total) by mouth at bedtime. 30 tablet 0  . vitamin B-12 (CYANOCOBALAMIN) 100 MCG tablet Take 100 mcg by mouth daily.     No current facility-administered medications on file prior to visit.    Allergies:   Allergies  Allergen Reactions  . Sulfa Antibiotics Hives  . Carvedilol Rash     Physical Exam  Vitals:   02/07/20 0949  BP: 137/70  Pulse: 84  Temp: (!) 96.8 F (36 C)  Weight: 124 lb 12.8 oz (56.6 kg)  Height: 5\' 4"  (1.626 m)   Body mass index is 21.42 kg/m. No exam data present  General: well developed, well nourished,  pleasant elderly Caucasian female, seated, in no evident distress Head: head normocephalic and atraumatic.   Neck: supple with no carotid or supraclavicular bruits Cardiovascular: regular rate and rhythm, no murmurs Musculoskeletal: no deformity Skin:  no rash/petichiae Vascular:  Normal pulses all  extremities   Neurologic Exam Mental Status: Awake and fully alert.   Normal speech/language.  Oriented to place and time. Recent and remote memory intact. Attention span, concentration and fund of knowledge appropriate. Mood and affect appropriate.  Cranial Nerves:  Pupils equal, briskly reactive to light. Extraocular movements full without nystagmus. Visual fields full to confrontation. Hearing intact. Facial sensation intact.   Motor: Normal bulk and tone.  Mild left hemiparesis stable Sensory.: intact to touch , pinpri ck , position and vibratory sensation.  Coordination: Mildly decreased finger tapping left hand.  No evidence of abnormality on right side Gait and Station: Arises from chair without difficulty. Stance is normal.  Gait abnormality with mild favoring of left leg Reflexes: 1+ and symmetric. Toes downgoing.      Diagnostic Data (Labs, Imaging, Testing)  CT HEAD WO CONTRAST 06/13/2019 IMPRESSION: This CT scan of the head without contrast shows the following: 1.  Chronic lacunar infarctions in the thalamus bilaterally and in the right basal ganglia.  These were also noted on the brain MRI from earlier this year. 2.  Chronic microvascular ischemic changes in the hemisphere, also seen on the previous brain MRI. 3.  Right mastoid effusion that could be due to eustachian tube dysfunction, also present on the recent brain MRI. 4.  There are no acute findings.    ASSESSMENT: Tammy Allison is a 85 y.o. year old female who presented to [facility] ED on 04/25/2019 with left-sided weakness and numbness with diagnosis of right brain TIA secondary to small vessel disease.  Vascular risk factors include HTN, HLD and prior stroke on imaging.  Mild left-sided weakness post stroke.  She underwent sleep study due to excessive daytime fatigue and insomnia which was negative for sleep apnea but did show evidence of PLMD.  Reports improvement of insomnia and daytime fatigue after initiation of  gabapentin and recent increase by PCP to 900 mg nightly    PLAN:  1. TIA: Continue aspirin 81 mg daily  and Crestor for secondary stroke prevention. Maintain strict control of hypertension with blood pressure goal below 130/90, diabetes with hemoglobin A1c goal below 6.5% and cholesterol with LDL cholesterol (bad cholesterol) goal below 70 mg/dL.  I also advised the patient to eat a healthy diet with plenty of whole grains, cereals, fruits and vegetables, exercise regularly with at least 30 minutes of continuous activity daily and maintain ideal body weight. 2. PLMD: Likely contributing to insomnia and daytime fatigue.  Continuation of gabapentin 900 mg nightly with ongoing benefit.  Prior ferritin level low and initiated ferrous sulfate but unable to tolerate due to side effects therefore self discontinued.  Advised to monitor symptoms and if worsens, may need further evaluation.  Recent lab work by PCP repeat ferritin level but unable to personally viewed 3. HTN: Advised to continue current treatment regimen.  Advised to continue to monitor at home along with continued follow-up with cardiology 4. HLD: Advised to continue current treatment regimen along with continued follow-up with PCP for future prescribing and monitoring of lipid panel  Follow-up in 6 months or call earlier if needed   I spent 23 minutes of face-to-face and non-face-to-face time with patient.  This included previsit chart review, lab review, study review, order entry, electronic health record documentation, patient education     Frann Rider, [facility]  [facility] [address] [place], [place] [address]  Phone [phone] Fax [phone] Note: This document was prepared with digital dictation and possible smart phrase technology. Any transcriptional errors that result from this process are unintentional.

## 2020-02-08 ENCOUNTER — Telehealth: Payer: Self-pay | Admitting: *Deleted

## 2020-02-08 NOTE — Telephone Encounter (Signed)
I did send a message to PCP in regards to further assistance with iron deficiency as unable to tolerate ferrous sulfate.  Agree with your recommendations for patient to discuss symptoms further with PCP for further evaluation

## 2020-02-08 NOTE — Telephone Encounter (Signed)
I called pt to let her know to connect back with pcp about her iron.  She then mentioned that since senn yesterday, has noted spells, lasting few seconds, not feeling right, cant explain it, (not exerting herself, sometimes sitting, or standing, has had 3 since seen yesterday.  Taking 81mg  aspirin,  Is limited on her fluid intake per pcp due to sodium level.  I relayed will let her know about the spells.

## 2020-02-08 NOTE — Telephone Encounter (Signed)
Received las results from [facility]. From Dr. Sabra Heck. To JM/NP for review.

## 2020-02-08 NOTE — Telephone Encounter (Signed)
I called pt and relayed to contact pcp for dicuss sx and she stated she would.  I relayed if has sx of stroke then call 911 or go to ED she verbalized understanding.

## 2020-02-08 NOTE — Telephone Encounter (Signed)
Received fax from [facility] regarding patient's recent lab work completed on 02/03/2020.  Lipid panel: Cholesterol 124 Trig 103 HDL 66 LDL 40  Ferritin: 21.8  -was advised to stay on iron daily but at follow-up visit, patient unable to tolerate ongoing oral formulation due to side effects.  Will advise patient to follow-up with PCP for further recommendations  TSH: 0.76  CMP: At goal  CBC: At goal

## 2020-02-17 NOTE — Progress Notes (Signed)
I agree with the above plan 

## 2020-03-13 NOTE — Progress Notes (Signed)
[facility] Note  03/16/2020     CHIEF COMPLAINT Patient presents for Retina Follow Up   HISTORY OF PRESENT ILLNESS: Tammy Allison is a 85 y.o. female who presents to the clinic today for:   HPI    Retina Follow Up    Patient presents with  Wet AMD.  In right eye.  This started 8 weeks ago.  Severity is moderate.  I, the attending physician,  performed the HPI with the patient and updated documentation appropriately.          Comments    Patient here for 8 weeks retina follow up for exu ARMD OD. Patient states vision doing ok. No eye pain.        Last edited by Bernarda Caffey, MD on 03/16/2020 11:03 AM. (History)    pt states vision is doing well   Referring physician: [name], MD [place],  [place] [address]  HISTORICAL INFORMATION:   Selected notes from the MEDICAL RECORD NUMBER Referred by Dr. Quentin Ore for exu ARMD OD    CURRENT MEDICATIONS: No current outpatient medications on file. (Ophthalmic Drugs)   No current facility-administered medications for this visit. (Ophthalmic Drugs)   Current Outpatient Medications (Other)  Medication Sig  . Ascorbic Acid (VITAMIN C) 100 MG tablet Take 1 tablet (100 mg total) by mouth 2 (two) times daily. Take 1 tablet twice daily with ferrous sulfate  . aspirin EC 81 MG EC tablet Take 1 tablet (81 mg total) by mouth daily.  . cetirizine (ZYRTEC) 10 MG tablet Take 5 mg by mouth at bedtime.   . cholecalciferol (VITAMIN D) 1000 UNITS tablet Take 1,000 Units by mouth every morning.   . gabapentin (NEURONTIN) 300 MG capsule Take 3 capsules (900 mg total) by mouth at bedtime.  . hydrALAZINE (APRESOLINE) 50 MG tablet TAKE 1 TABLET BY MOUTH 3  TIMES DAILY  . levothyroxine (SYNTHROID) 88 MCG tablet   . losartan (COZAAR) 100 MG tablet Take 1 tablet (100 mg total) by mouth daily.  . pantoprazole (PROTONIX) 40 MG tablet Take 40 mg by mouth every morning.   . Probiotic Product (ALIGN)  4 MG CAPS Take 4 mg by mouth daily.  . rosuvastatin (CRESTOR) 5 MG tablet Take 1 tablet (5 mg total) by mouth at bedtime.  . vitamin B-12 (CYANOCOBALAMIN) 100 MCG tablet Take 100 mcg by mouth daily.   No current facility-administered medications for this visit. (Other)      REVIEW OF SYSTEMS: ROS    Positive for: Neurological, Eyes   Negative for: Constitutional, Gastrointestinal, Skin, Genitourinary, Musculoskeletal, HENT, Endocrine, Cardiovascular, Respiratory, Psychiatric, Allergic/Imm, Heme/Lymph   Last edited by Theodore Demark, COA on 03/16/2020  9:42 AM. (History)       ALLERGIES Allergies  Allergen Reactions  . Sulfa Antibiotics Hives  . Carvedilol Rash    PAST MEDICAL HISTORY Past Medical History:  Diagnosis Date  . Arthritis   . GERD (gastroesophageal reflux disease)   . H/O hiatal hernia   . Hyperlipidemia   . Hypertension   . Hypothyroidism   . Irritable bowel syndrome   . Macular degeneration    Exudative - OD   Past Surgical History:  Procedure Laterality Date  . CATARACT EXTRACTION    . EYE SURGERY Bilateral    cataract surgery w/ lens implant  . FRACTURE SURGERY     right hip  . LUMBAR LAMINECTOMY/DECOMPRESSION MICRODISCECTOMY Left 05/25/2014   Procedure: LUMBAR LAMINECTOMY/DECOMPRESSION  MICRODISCECTOMY 1 LEVEL   lumbar three;  Surgeon: Hosie Spangle, MD;  Location: [facility];  Service: Neurosurgery;  Laterality: Left;  . THYROID SURGERY      FAMILY HISTORY Family History  Problem Relation Age of Onset  . Macular degeneration Mother     SOCIAL HISTORY Social History   Tobacco Use  . Smoking status: Never Smoker  . Smokeless tobacco: Never Used  Substance Use Topics  . Alcohol use: No  . Drug use: Never         OPHTHALMIC EXAM:  Base Eye Exam    Visual Acuity (Snellen - Linear)      Right Left   Dist cc 20/50 -1 20/20   Dist ph cc NI    Correction: Glasses       Tonometry (Tonopen, 9:39 AM)      Right Left    Pressure 13 12       Pupils      Dark Light Shape React APD   Right 2 1 Round Minimal None   Left 3 2 Round Minimal None       Visual Fields (Counting fingers)      Left Right    Full Full       Extraocular Movement      Right Left    Full, Ortho Full, Ortho       Neuro/Psych    Oriented x3: Yes   Mood/Affect: Normal       Dilation    Both eyes: 1.0% Mydriacyl, 2.5% Phenylephrine @ 9:39 AM        Slit Lamp and Fundus Exam    Slit Lamp Exam      Right Left   Lids/Lashes Dermatochalasis - upper lid Dermatochalasis - upper lid   Conjunctiva/Sclera White and quiet White and quiet   Cornea Arcus, 1+ Punctate epithelial erosions, mild Debris in tear film Arcus, 1+ Punctate epithelial erosions, mild Debris in tear film   Anterior Chamber Deep and quiet Deep and quiet   Iris Round and dilated Round and dilated   Lens PC IOL in good position with open PC PC IOL in good position with open PC   Vitreous Vitreous syneresis, Posterior vitreous detachment Vitreous syneresis, Posterior vitreous detachment, vitreous condensations       Fundus Exam      Right Left   Disc Sharp rim, mild Pallor sharp rim, trace pallor, Tilted disc, temporal Peripapillary atrophy   C/D Ratio 0.4 0.4   Macula Flat, blunted foveal reflex, Large central PEDs with stable resolution of SRF overlying, pigment mottling and clumping, Drusen, no heme flat, Blunted foveal reflex, +PED, Drusen, Retinal pigment epithelial mottling and clumping, no heme   Vessels Vascular attenuation, Tortuous, AV crossing changes Vascular attenuation, mild Tortuousity   Periphery Attached, no heme, peripheral drusen Attached, no heme        Refraction    Wearing Rx      Sphere Cylinder Axis Add   Right -0.75 +0.50 089 +2.50   Left -0.75 Sphere  +2.50   Type: Bifocal          IMAGING AND PROCEDURES  Imaging and Procedures for @TODAY @  OCT, Retina - OU - Both Eyes       Right Eye Quality was good. Central Foveal  Thickness: 430. Progression has been stable. Findings include abnormal foveal contour, no IRF, pigment epithelial detachment, retinal drusen , outer retinal atrophy, no SRF (Stable PED size).   Left Eye Quality  was good. Central Foveal Thickness: 293. Progression has been stable. Findings include normal foveal contour, pigment epithelial detachment, no IRF, no SRF, retinal drusen  (Stable from prior).   Notes *Images captured and stored on drive  Diagnosis / Impression:  OD: Exudative ARMD -- stable resolution of SRF overlying PED OS: Non-Exudative ARMD w/ stable PEDs -- stable from prior  Clinical management:  See below  Abbreviations: NFP - Normal foveal profile. CME - cystoid macular edema. PED - pigment epithelial detachment. IRF - intraretinal fluid. SRF - subretinal fluid. EZ - ellipsoid zone. ERM - epiretinal membrane. ORA - outer retinal atrophy. ORT - outer retinal tubulation. SRHM - subretinal hyper-reflective material                  ASSESSMENT/PLAN:    ICD-10-CM   1. Exudative age-related macular degeneration of right eye with active choroidal neovascularization ([place])  H35.3211   2. 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 Retinal edema  H35.81 OCT, Retina - OU - Both Eyes  4. Posterior vitreous detachment of both eyes  H43.813   5. Pseudophakia of both eyes  Z96.1   6. PCO (posterior capsular opacification), bilateral  H26.493   7. Dry eyes  H04.123     1,3. Exudative age related macular degeneration, OD              - initial BCVA OD 20/30             - initial OCT w/ PEDs OU (OD > OS); mild SRF overlying PED OD  - initial FA with mild CNVM OD  - S/P IVA OD #1 (07.12.19), #2 (08.12.19), #3 (09.09.19)  - S/P IVE #1 OD (10.07.19), #2 (11.04.19), #3 (12.02.19), #4 (12.30.19), #5 (01.27.20), #6 (03.09.20), #7 (05.04.20)  - BCVA stable at 20/50   - OCT shows stable improvement in SRF, but persistent central  PED  - no IVE since May 2020  - review of prior OCTs shows no significant change in SRF overlying PEDs with or without anti-VEGF therapy  - recommend holding off on IVE  - [place] paperwork signed and benefits investigation started on 09.09.19 -- approved for Good Days 2021  - f/u in 3-4 months, sooner PRN  2. Age related macular degeneration, non-exudative, OS  - The incidence, anatomy, and pathology of dry AMD, risk of progression, and the AREDS and AREDS 2 study including smoking risks discussed with patient  - continue amsler grid monitoring  4. PVD / vitreous syneresis OU  - Discussed findings and prognosis  - No RT or RD on 360 scleral depressed exam  - Reviewed s/s of RT/RD  - Strict return precautions for any such RT/RD signs/symptoms  5. Pseudophakia OU  - s/p CE/IOL OU  - beautiful surgeries, doing well  - monitor  6. PCO OU (OS > OD)  - s/p YAG cap OU (09/2018) w/ Dr. Kathlen Mody  - doing well, VA improved OU  7. Dry eyes OU - recommend artificial tears and lubricating ointment as needed    Ophthalmic Meds Ordered this visit:  No orders of the defined types were placed in this encounter.      Return for f/u 3-4 months, exu ARMD OD, DFE, OCT.  There are no Patient Instructions on file for this visit.   Explained the diagnoses, plan, and follow up with the patient and they expressed understanding.  Patient expressed understanding of the importance of proper follow up care.  This document serves as a record of services personally performed by Gardiner Sleeper, MD, PhD. It was created on their behalf by Ernest Mallick, OA, an ophthalmic assistant. The creation of this record is the provider's dictation and/or activities during the visit.    Electronically signed by: Ernest Mallick, OA 04.27.2021 12:49 PM   Gardiner Sleeper, M.D., Ph.D. Diseases & Surgery of the Retina and Vitreous [facility]  I have reviewed the above documentation for accuracy  and completeness, and I agree with the above. Gardiner Sleeper, M.D., Ph.D. 03/16/20 12:49 PM   Abbreviations: M myopia (nearsighted); A astigmatism; H hyperopia (farsighted); P presbyopia; Mrx spectacle prescription;  CTL contact lenses; OD right eye; OS left eye; OU both eyes  XT exotropia; ET esotropia; PEK punctate epithelial keratitis; PEE punctate epithelial erosions; DES dry eye syndrome; MGD meibomian gland dysfunction; ATs artificial tears; PFAT's preservative free artificial tears; [place] nuclear sclerotic cataract; PSC posterior subcapsular cataract; ERM epi-retinal membrane; PVD posterior vitreous detachment; RD retinal detachment; DM diabetes mellitus; DR diabetic retinopathy; NPDR non-proliferative diabetic retinopathy; PDR proliferative diabetic retinopathy; CSME clinically significant macular edema; DME diabetic macular edema; dbh dot blot hemorrhages; CWS cotton wool spot; POAG primary open angle glaucoma; C/D cup-to-disc ratio; HVF humphrey visual field; GVF goldmann visual field; OCT optical coherence tomography; IOP intraocular pressure; BRVO Branch retinal vein occlusion; CRVO central retinal vein occlusion; CRAO central retinal artery occlusion; BRAO branch retinal artery occlusion; RT retinal tear; SB scleral buckle; PPV pars plana vitrectomy; VH Vitreous hemorrhage; PRP panretinal laser photocoagulation; IVK intravitreal kenalog; VMT vitreomacular traction; MH Macular hole;  NVD neovascularization of the disc; NVE neovascularization elsewhere; AREDS age related eye disease study; ARMD age related macular degeneration; POAG primary open angle glaucoma; EBMD epithelial/anterior basement membrane dystrophy; ACIOL anterior chamber intraocular lens; IOL intraocular lens; PCIOL posterior chamber intraocular lens; Phaco/IOL phacoemulsification with intraocular lens placement; [place] photorefractive keratectomy; LASIK laser assisted in situ keratomileusis; HTN hypertension; DM diabetes mellitus; COPD  chronic obstructive pulmonary disease

## 2020-03-16 ENCOUNTER — Ambulatory Visit (INDEPENDENT_AMBULATORY_CARE_PROVIDER_SITE_OTHER): Payer: Medicare Other | Admitting: Ophthalmology

## 2020-03-16 ENCOUNTER — Encounter (INDEPENDENT_AMBULATORY_CARE_PROVIDER_SITE_OTHER): Payer: Self-pay | Admitting: Ophthalmology

## 2020-03-16 DIAGNOSIS — H353211 Exudative age-related macular degeneration, right eye, with active choroidal neovascularization: Secondary | ICD-10-CM

## 2020-03-16 DIAGNOSIS — H43813 Vitreous degeneration, bilateral: Secondary | ICD-10-CM | POA: Diagnosis not present

## 2020-03-16 DIAGNOSIS — Z961 Presence of intraocular lens: Secondary | ICD-10-CM

## 2020-03-16 DIAGNOSIS — H3581 Retinal edema: Secondary | ICD-10-CM | POA: Diagnosis not present

## 2020-03-16 DIAGNOSIS — H04123 Dry eye syndrome of bilateral lacrimal glands: Secondary | ICD-10-CM

## 2020-03-16 DIAGNOSIS — H353122 Nonexudative age-related macular degeneration, left eye, intermediate dry stage: Secondary | ICD-10-CM | POA: Diagnosis not present

## 2020-03-16 DIAGNOSIS — H26493 Other secondary cataract, bilateral: Secondary | ICD-10-CM

## 2020-05-10 ENCOUNTER — Emergency Department (HOSPITAL_COMMUNITY): Payer: Medicare Other

## 2020-05-10 ENCOUNTER — Emergency Department (HOSPITAL_COMMUNITY)
Admission: EM | Admit: 2020-05-10 | Discharge: 2020-05-10 | Disposition: A | Discharge status: Home / Self Care | Payer: Medicare Other | Attending: Emergency Medicine | Admitting: Emergency Medicine

## 2020-05-10 ENCOUNTER — Other Ambulatory Visit: Payer: Self-pay

## 2020-05-10 ENCOUNTER — Encounter (HOSPITAL_COMMUNITY): Payer: Self-pay | Admitting: Emergency Medicine

## 2020-05-10 ENCOUNTER — Other Ambulatory Visit: Payer: Self-pay | Admitting: Family Medicine

## 2020-05-10 DIAGNOSIS — Z79899 Other long term (current) drug therapy: Secondary | ICD-10-CM | POA: Insufficient documentation

## 2020-05-10 DIAGNOSIS — Z7982 Long term (current) use of aspirin: Secondary | ICD-10-CM | POA: Insufficient documentation

## 2020-05-10 DIAGNOSIS — R110 Nausea: Secondary | ICD-10-CM

## 2020-05-10 DIAGNOSIS — R140 Abdominal distension (gaseous): Secondary | ICD-10-CM | POA: Insufficient documentation

## 2020-05-10 DIAGNOSIS — I10 Essential (primary) hypertension: Secondary | ICD-10-CM | POA: Diagnosis not present

## 2020-05-10 DIAGNOSIS — E871 Hypo-osmolality and hyponatremia: Secondary | ICD-10-CM | POA: Diagnosis not present

## 2020-05-10 DIAGNOSIS — K589 Irritable bowel syndrome without diarrhea: Secondary | ICD-10-CM | POA: Diagnosis not present

## 2020-05-10 DIAGNOSIS — R531 Weakness: Secondary | ICD-10-CM | POA: Insufficient documentation

## 2020-05-10 DIAGNOSIS — E039 Hypothyroidism, unspecified: Secondary | ICD-10-CM | POA: Diagnosis not present

## 2020-05-10 DIAGNOSIS — R109 Unspecified abdominal pain: Secondary | ICD-10-CM

## 2020-05-10 DIAGNOSIS — R7989 Other specified abnormal findings of blood chemistry: Secondary | ICD-10-CM | POA: Diagnosis present

## 2020-05-10 LAB — CBC WITH DIFFERENTIAL/PLATELET
Abs Immature Granulocytes: 0.03 10*3/uL (ref 0.00–0.07)
Basophils Absolute: 0.1 10*3/uL (ref 0.0–0.1)
Basophils Relative: 1 %
Eosinophils Absolute: 0 10*3/uL (ref 0.0–0.5)
Eosinophils Relative: 0 %
HCT: 36.8 % (ref 36.0–46.0)
Hemoglobin: 12.4 g/dL (ref 12.0–15.0)
Immature Granulocytes: 0 %
Lymphocytes Relative: 10 %
Lymphs Abs: 0.7 10*3/uL (ref 0.7–4.0)
MCH: 31 pg (ref 26.0–34.0)
MCHC: 33.7 g/dL (ref 30.0–36.0)
MCV: 92 fL (ref 80.0–100.0)
Monocytes Absolute: 0.9 10*3/uL (ref 0.1–1.0)
Monocytes Relative: 12 %
Neutro Abs: 5.8 10*3/uL (ref 1.7–7.7)
Neutrophils Relative %: 77 %
Platelets: 219 10*3/uL (ref 150–400)
RBC: 4 MIL/uL (ref 3.87–5.11)
RDW: 13.2 % (ref 11.5–15.5)
WBC: 7.6 10*3/uL (ref 4.0–10.5)
nRBC: 0 % (ref 0.0–0.2)

## 2020-05-10 LAB — COMPREHENSIVE METABOLIC PANEL
ALT: 10 U/L (ref 0–44)
AST: 23 U/L (ref 15–41)
Albumin: 3 g/dL — ABNORMAL LOW (ref 3.5–5.0)
Alkaline Phosphatase: 45 U/L (ref 38–126)
Anion gap: 11 (ref 5–15)
BUN: 6 mg/dL — ABNORMAL LOW (ref 8–23)
CO2: 23 mmol/L (ref 22–32)
Calcium: 8.3 mg/dL — ABNORMAL LOW (ref 8.9–10.3)
Chloride: 94 mmol/L — ABNORMAL LOW (ref 98–111)
Creatinine, Ser: 0.67 mg/dL (ref 0.44–1.00)
GFR calc Af Amer: >60 mL/min (ref >60)
GFR calc non Af Amer: >60 mL/min (ref >60)
Glucose, Bld: 114 mg/dL — ABNORMAL HIGH (ref 70–99)
Potassium: 3.4 mmol/L — ABNORMAL LOW (ref 3.5–5.1)
Sodium: 128 mmol/L — ABNORMAL LOW (ref 135–145)
Total Bilirubin: 1.1 mg/dL (ref 0.3–1.2)
Total Protein: 5.8 g/dL — ABNORMAL LOW (ref 6.5–8.1)

## 2020-05-10 LAB — LIPASE, BLOOD: Lipase: 24 U/L (ref 11–51)

## 2020-05-10 MED ORDER — SODIUM CHLORIDE 0.9 % IV SOLN: INTRAVENOUS | Status: DC

## 2020-05-10 MED ORDER — IOHEXOL 300 MG/ML  SOLN
100.0000 mL | Freq: Once | INTRAMUSCULAR | Status: AC | PRN
  Administered 2020-05-10: 100 mL via INTRAVENOUS

## 2020-05-10 MED ORDER — METOCLOPRAMIDE HCL 5 MG/ML IJ SOLN
10.0000 mg | Freq: Once | INTRAMUSCULAR | Status: AC
  Administered 2020-05-10: 10 mg via INTRAVENOUS
  Filled 2020-05-10: qty 2

## 2020-05-10 MED ORDER — SODIUM CHLORIDE 0.9 % IV BOLUS
1000.0000 mL | Freq: Once | INTRAVENOUS | Status: AC
  Administered 2020-05-10: 1000 mL via INTRAVENOUS

## 2020-05-10 NOTE — ED Triage Notes (Signed)
Pt BIB [facility] from home, c/o generalized weakness x 1 week, seen by her PCP this morning, had blood work done and told to come to the ED due to low sodium. Pt reports she has been nauseous all night, denies vomiting/diarrhea. EMS VSS.

## 2020-05-10 NOTE — ED Provider Notes (Signed)
[facility] EMERGENCY DEPARTMENT Provider Note   CSN: [id number] Arrival date & time: 05/10/20  1743     History Chief Complaint  Patient presents with  . Abnormal Lab    Tammy Allison is a 85 y.o. female.  85 year old female presents with increased weakness times several weeks.  She has had nausea but no vomiting.  Denies any fever or abdominal discomfort.  Was seen by her doctor today and was sent here for further management.  Patient does endorse whole body weakness.  No focality to it.  Patient apparently had blood work done which showed a low sodium.  Patient does not know what that value is.  Denies any diarrhea.  No urinary symptoms.  No treatment use prior to arrival        Past Medical History:  Diagnosis Date  . Arthritis   . GERD (gastroesophageal reflux disease)   . H/O hiatal hernia   . Hyperlipidemia   . Hypertension   . Hypothyroidism   . Irritable bowel syndrome   . Macular degeneration    Exudative - OD    Patient Active Problem List   Diagnosis Date Noted  . Hypocalcemia 04/25/2019  . Cerebral thrombosis with transient ischemic attack (TIA) 04/25/2019  . Snoring 03/17/2019  . Nausea alone 05/31/2014  . Malnutrition of moderate degree ([place]) 05/30/2014  . Weakness 05/29/2014  . Tachycardia 05/29/2014  . HNP (herniated nucleus pulposus), lumbar 05/25/2014  . Electrolyte abnormality 05/12/2014  . Hypokalemia 05/12/2014  . Hyponatremia 05/12/2014  . Lumbosacral radiculopathy 05/12/2014  . Hematuria 05/12/2014  . Benign essential hypertension 05/12/2014  . Hyperlipidemia LDL goal <100 05/12/2014  . Hypothyroid 05/12/2014    Past Surgical History:  Procedure Laterality Date  . CATARACT EXTRACTION    . EYE SURGERY Bilateral    cataract surgery w/ lens implant  . FRACTURE SURGERY     right hip  . LUMBAR LAMINECTOMY/DECOMPRESSION MICRODISCECTOMY Left 05/25/2014   Procedure: LUMBAR LAMINECTOMY/DECOMPRESSION MICRODISCECTOMY 1  LEVEL   lumbar three;  Surgeon: Hosie Spangle, MD;  Location: [facility];  Service: Neurosurgery;  Laterality: Left;  . THYROID SURGERY       OB History   No obstetric history on file.     Family History  Problem Relation Age of Onset  . Macular degeneration Mother     Social History   Tobacco Use  . Smoking status: Never Smoker  . Smokeless tobacco: Never Used  Vaping Use  . Vaping Use: Never used  Substance Use Topics  . Alcohol use: No  . Drug use: Never    Home Medications Prior to Admission medications   Medication Sig Start Date End Date Taking? Authorizing Provider  aspirin EC 81 MG EC tablet Take 1 tablet (81 mg total) by mouth daily. 04/27/19  Yes Vann, Jessica U, DO  cetirizine (ZYRTEC) 10 MG tablet Take 5 mg by mouth at bedtime as needed for allergies.    Yes [provider]  cholecalciferol (VITAMIN D) 1000 UNITS tablet Take 1,000 Units by mouth every morning.    Yes [provider]  gabapentin (NEURONTIN) 300 MG capsule Take 3 capsules (900 mg total) by mouth at bedtime. Patient taking differently: Take 600 mg by mouth at bedtime.  02/07/20  Yes McCue, Janett Billow, NP  hydrALAZINE (APRESOLINE) 50 MG tablet TAKE 1 TABLET BY MOUTH 3  TIMES DAILY Patient taking differently: Take 50 mg by mouth in the morning and at bedtime.  01/11/20  Yes [name],  Lynnell Dike, MD  levothyroxine (SYNTHROID) 88 MCG tablet Take 88 mcg by mouth daily before breakfast.  07/20/19  Yes [provider]  losartan (COZAAR) 100 MG tablet Take 1 tablet (100 mg total) by mouth daily. Patient taking differently: Take 100 mg by mouth every evening.  04/29/19  Yes Vann, Jessica U, DO  melatonin 3 MG TABS tablet Take 6 mg by mouth at bedtime as needed (sleep).   Yes [provider]  Multiple Vitamins-Minerals (PRESERVISION AREDS) TABS Take 1 tablet by mouth in the morning and at bedtime.   Yes [provider]  pantoprazole (PROTONIX) 40 MG tablet Take 40 mg by  mouth daily as needed (indigestion).    Yes [provider]  Probiotic Product (ALIGN) 4 MG CAPS Take 4 mg by mouth daily.   Yes [provider]  rosuvastatin (CRESTOR) 5 MG tablet Take 5 mg by mouth at bedtime.   Yes [provider]  vitamin B-12 (CYANOCOBALAMIN) 100 MCG tablet Take 100 mcg by mouth daily.   Yes [provider]  Ascorbic Acid (VITAMIN C) 100 MG tablet Take 1 tablet (100 mg total) by mouth 2 (two) times daily. Take 1 tablet twice daily with ferrous sulfate Patient not taking: Reported on 05/10/2020 10/11/19   Frann Rider, NP  ondansetron (ZOFRAN) 4 MG tablet Take 4 mg by mouth at bedtime. 05/10/20   [provider]  rosuvastatin (CRESTOR) 5 MG tablet Take 1 tablet (5 mg total) by mouth at bedtime. 04/26/19 04/25/20  Geradine Girt, DO    Allergies    Sulfa antibiotics and Carvedilol  Review of Systems   Review of Systems  All other systems reviewed and are negative.   Physical Exam Updated Vital Signs BP (!) 181/75 (BP Location: Right Arm)   Pulse 89   Temp 98 F (36.7 C) (Oral)   Resp 15   Ht 1.626 m (5\' 4" )   Wt 53.1 kg   SpO2 100%   BMI 20.08 kg/m   Physical Exam Vitals and nursing note reviewed.  Constitutional:      General: She is not in acute distress.    Appearance: Normal appearance. She is well-developed. She is not toxic-appearing.  HENT:     Head: Normocephalic and atraumatic.  Eyes:     General: Lids are normal.     Conjunctiva/sclera: Conjunctivae normal.     Pupils: Pupils are equal, round, and reactive to light.  Neck:     Thyroid: No thyroid mass.     Trachea: No tracheal deviation.  Cardiovascular:     Rate and Rhythm: Normal rate and regular rhythm.     Heart sounds: Normal heart sounds. No murmur heard.  No gallop.   Pulmonary:     Effort: Pulmonary effort is normal. No respiratory distress.     Breath sounds: Normal breath sounds. No stridor. No decreased breath sounds, wheezing,  rhonchi or rales.  Abdominal:     General: Bowel sounds are normal. There is no distension.     Palpations: Abdomen is soft.     Tenderness: There is no abdominal tenderness. There is no rebound.  Musculoskeletal:        General: No tenderness. Normal range of motion.     Cervical back: Normal range of motion and neck supple.  Skin:    General: Skin is warm and dry.     Findings: No abrasion or rash.  Neurological:     Mental Status: She is alert and oriented  to person, place, and time.     GCS: GCS eye subscore is 4. GCS verbal subscore is 5. GCS motor subscore is 6.     Cranial Nerves: No cranial nerve deficit.     Sensory: No sensory deficit.  Psychiatric:        Speech: Speech normal.        Behavior: Behavior normal.     ED Results / Procedures / Treatments   Labs (all labs ordered are listed, but only abnormal results are displayed) Labs Reviewed  CBC WITH DIFFERENTIAL/PLATELET  COMPREHENSIVE METABOLIC PANEL  LIPASE, BLOOD    EKG None  Radiology No results found.  Procedures Procedures (including critical care time)  Medications Ordered in ED Medications  0.9 %  sodium chloride infusion (has no administration in time range)  sodium chloride 0.9 % bolus 1,000 mL (has no administration in time range)  metoCLOPramide (REGLAN) injection 10 mg (has no administration in time range)    ED Course  I have reviewed the triage vital signs and the nursing notes.  Pertinent labs & imaging results that were available during my care of the patient were reviewed by me and considered in my medical decision making (see chart for details).    MDM Rules/Calculators/A&P                          Patient relates to me that she is asked that weakness for several months up to a year.  Sodium here is 128 and she was given IV fluids and does feel unchanged from her baseline.  Patient has PCP at ordering outpatient abdominal CT which was done here which was negative for acute  intra-abdominal or pelvic pathology.  Will discharge home and she can follow-up with her doctor Final Clinical Impression(s) / ED Diagnoses Final diagnoses:  None    Rx / DC Orders ED Discharge Orders    None       Lacretia Leigh, MD 05/10/20 2119

## 2020-05-10 NOTE — ED Notes (Signed)
Pt transported to CT ?

## 2020-05-10 NOTE — Discharge Instructions (Addendum)
Your sodium today was 128.  Follow-up with your doctor for a repeat value next week

## 2020-05-10 NOTE — ED Notes (Signed)
Discharge instructions discussed with pt. And daughter. Pt to follow up with pcp to recheck Na level. Pt ambulatory at discharge.

## 2020-06-27 ENCOUNTER — Encounter (INDEPENDENT_AMBULATORY_CARE_PROVIDER_SITE_OTHER): Payer: Medicare Other | Admitting: Ophthalmology

## 2020-06-28 ENCOUNTER — Telehealth: Payer: Self-pay | Admitting: *Deleted

## 2020-06-28 NOTE — Telephone Encounter (Signed)
° °  [facility] Pre-operative Risk Assessment    HEARTCARE STAFF: - Please ensure there is not already an duplicate clearance open for this procedure. - Under Visit Info/Reason for Call, type in Other and utilize the format Clearance MM/DD/YY or Clearance TBD. Do not use dashes or single digits. - If request is for dental extraction, please clarify the # of teeth to be extracted.  Request for surgical clearance:  1. What type of surgery is being performed? T11 KYPHOPLASTY   2. When is this surgery scheduled? 07/03/20   3. What type of clearance is required (medical clearance vs. Pharmacy clearance to hold med vs. Both)? MEDICAL  4. Are there any medications that need to be held prior to surgery and how long? ASA    5. Practice name and name of physician performing surgery? [facility]; DR. Mallie Mussel POOL   6. What is the office phone number? [phone]   7.   What is the office fax number? [phone]   8.   Anesthesia type (None, local, MAC, general) ? GENERAL   Julaine Hua 06/28/2020, 2:25 PM  _________________________________________________________________   (provider comments below)

## 2020-06-28 NOTE — Telephone Encounter (Signed)
   Primary Cardiologist: Sinclair Grooms, MD  Chart reviewed and the patient was contacted today by phone as part of pre-operative protocol coverage. Given past medical history and time since last visit, based on ACC/AHA guidelines, Tammy Allison would be at acceptable risk for the planned procedure without further cardiovascular testing.   Okay to hold aspirin 5 days preop, resume as soon as safe postop.  I will route this recommendation to the requesting party via Epic fax function and remove from pre-op pool.  Please call with questions.  Kerin Ransom, PA-C 06/28/2020, 2:42 PM

## 2020-08-09 ENCOUNTER — Ambulatory Visit: Payer: Medicare Other | Admitting: Adult Health

## 2020-08-14 ENCOUNTER — Encounter: Payer: Self-pay | Admitting: Adult Health

## 2020-08-14 ENCOUNTER — Other Ambulatory Visit: Payer: Self-pay

## 2020-08-14 ENCOUNTER — Ambulatory Visit: Payer: Medicare Other | Admitting: Adult Health

## 2020-08-14 VITALS — BP 122/64 | HR 113 | Ht 64.0 in | Wt 113.0 lb

## 2020-08-14 DIAGNOSIS — E785 Hyperlipidemia, unspecified: Secondary | ICD-10-CM | POA: Diagnosis not present

## 2020-08-14 DIAGNOSIS — I10 Essential (primary) hypertension: Secondary | ICD-10-CM

## 2020-08-14 DIAGNOSIS — Z8673 Personal history of transient ischemic attack (TIA), and cerebral infarction without residual deficits: Secondary | ICD-10-CM | POA: Diagnosis not present

## 2020-08-14 DIAGNOSIS — G4761 Periodic limb movement disorder: Secondary | ICD-10-CM | POA: Diagnosis not present

## 2020-08-14 MED ORDER — GABAPENTIN 300 MG PO CAPS: 900.0000 mg | ORAL_CAPSULE | Freq: Every day | ORAL | 3 refills | Status: DC

## 2020-08-14 NOTE — Patient Instructions (Signed)
We will request lab work from PCP to be sent to office for review - important to stabilize electrolytes as these abnormalities would be contributing to your symptoms   Increase your gabapentin to 900mg  nightly - this will help you with sleeping better through the night - please cal office after 2 weeks if no benefit   Continue aspirin 81 mg daily  and Crestor 5mg  daily  for secondary stroke prevention  Continue to follow up with PCP regarding cholesterol and blood pressure management  Maintain strict control of hypertension with blood pressure goal below 130/90 and cholesterol with LDL cholesterol (bad cholesterol) goal below 70 mg/dL.       Followup in the future with me in 6 months or call earlier if needed       Thank you for coming to see [place] at [facility]. I hope we have been able to provide you high quality care today.  You may receive a patient satisfaction survey over the next few weeks. We would appreciate your feedback and comments so that we may continue to improve ourselves and the health of our patients.

## 2020-08-14 NOTE — Progress Notes (Signed)
[facility] [address] [place]. [place] [address] [phone]       OFFICE FOLLOW UP NOTE  Ms. SHAQUAVIA WHISONANT Date of Birth:  26-Aug-1935 Medical Record Number:  [id number]   Reason for visit: TIA 04/25/2019    CHIEF COMPLAINT:  Chief Complaint  Patient presents with  . Follow-up    rm 9  . Transient Ischemic Attack    pt said she has had no new sx her last visit.    HPI:  Today, 08/14/2020, Ms. Rossbach returns for 41-month follow-up accompanied by her daughter.  She has been stable from a stroke standpoint since prior visit without new or reoccurring stroke/TIA symptoms.  Remains on aspirin 81 mg daily and Crestor 5 mg daily without side effects.  Blood pressure today 122/64.  She unfortunately suffered a thoracic compression fracture after a fall back in June and underwent surgical kyphoplasty in July.  Currently working with therapy and ambulating with rolling walker.  Denies any reoccurring falls.  Does continue to experience daytime fatigue and insomnia.  Currently taking gabapentin 600 mg nightly -PCP increased dosage to 900 mg shortly before prior visit but she is hesitant to increase dosage due to concern of her body becoming used to it and having to further increase.  She is tolerating well without side effects.  She also had a ED admission back in June due to increased generalized weakness and found to have multiple electrolyte abnormalities.  Reports lab work completed by PCP yesterday but unable to verify via epic.  No further concerns at this time.     History provided for reference purposes only Update 02/07/2020 JM: Ms. Eber is a 84 year old female who is being seen today, 02/07/2020, for follow-up regarding history of stroke and PLMD and insomnia symptoms.  She has been stable from a stroke standpoint since prior visit.  Continues on a if you see continued weight okay she states she was going in there that spirin 81 mg daily and Crestor 5 mg daily  for secondary stroke prevention without side effects.  She did have recent lab work by PCP including lipid panel but not able to personally review through epic.  Blood pressure today 137/70.  Prior sleep study for excessive fatigue and insomnia negative for sleep apnea but did show PLMD possibly contributing to insomnia and daytime fatigue.  Ferritin level obtained at prior visit at 14 therefore ferrous sulfate 325 mg daily and vitamin C initiated.  4-month repeat showed improvement of ferritin at 32.  Unfortunately, she self discontinued approximately 1 week ago due to side effects of constipation, nausea and change in taste.  She does report improvement of symptoms since discontinuing.  She did not notice any benefit in regards to her symptoms.  Gabapentin dosage also increased after prior visit currently on 600 mg nightly and again increase last week by PCP currently 900 mg nightly.  She does report improvement of insomnia sleeping more consistently throughout the night which has improved prior daytime fatigue.  Recent lab work by PCP included repeat ferritin and TSH but unable to personally review results.  No further concerns at this time.  Update 10/10/2019: Ms. Base is a 85 year old female who is being seen today for stroke follow up.  She has been stable from a neurological standpoint with continued logic complaining of excessive daytime fatigue.  Prior sleep study did show evidence of PLM.  She was started on Mirapex back in September by PCP without great benefit.  Recommended initiation  of gabapentin due to max dose of Mirapex and ongoing PLMD symptoms.  She has concerns regarding dyspnea on exertion potential side effect to gabapentin.  She was advised that this is typically not a side effect of gabapentin but can trial discontinuing.  She discontinued for 1 night and noticed a great difference of her sleep quality while off her gabapentin.  She restarted and discontinue Mirapex due to increased  fatigue in the morning.  She does endorse improvement with use of gabapentin 3 mg nightly which she is sleeping more consistently throughout the night and feels improvement of her daytime fatigue.  She will continue to have occasional nights of insomnia and return will have increased daytime fatigue.  She has continued to experience dyspnea on exertion or with conversation but has not followed up with cardiology at this time.  Blood pressure today 136/58.  Reviewed test results from PCP which were largely unremarkable but iron level not obtained.  She does report recent lab work by PCP (1 to 2 months prior) with improvement of her sodium levels but she continues to be fluid restricted.  Unable to personally view results.  She remains on aspirin and Crestor for secondary stroke prevention without side effects. No further concerns at this time.  Update 08/09/2019: Ms. Delpriore is being seen today for stroke follow-up.  She has been stable from a neurological standpoint.  CT head obtained due to left-sided weakness but imaging unremarkable for acute etiology.  She did undergo sleep study on 06/24/2019 with difficulty sleeping with only 75 minutes of recorded sleep time but during that time, no evidence of sleep apnea.  Felt insomnia was related to pain and PLM.  She continues to report insomnia with excessive daytime fatigue.  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  She has not noticed much benefit at this time.  She is overly frustrated with ongoing daytime fatigue.  She reports having no energy for daily functioning and can even interfere with ADL/IADLs.  She did have recent lab work done by PCP but is unsure of what tests were performed.  She continues on aspirin and Crestor for secondary stroke prevention without side effects.  Blood pressure today stable.  No further concerns at this time.  Initial visit 06/02/2019: Stable from neurological standpoint until 2 days ago when she was out shopping with her daughter  and she started to a pulling sensation towards her right.  She denies any weakness, dizziness or lightheadedness sensation at that time.  She was able to continue walking and attempts to find a seat but ended up leaning on a counter until her daughter came to help her.  She was able to ambulate independently back into the car out from a store and sensation resolved after approximately 20 minutes.  She denies any associated neurological symptoms.  She also endorses generalized fatigue/weakness feeling which has been present over the past 6 to 12 months and believes this is due to continued change in blood pressure medications by cardiology.  She continues to be followed by Dr. [name] who has attempted to encourage her to undergo sleep study but previously declined as she felt as though symptoms were due to medications.  She is willing at this time to undergo sleep study at this time.  She does endorse nighttime snoring, insomnia and excessive daytime fatigue.  Blood pressure has been stable with today's reading 137/64.  She continues on Crestor without myalgias.  Completed 3 weeks DAPT and continues on aspirin alone without  bleeding or bruising.  She was initially doing home health therapy which has since been discontinued.  She continues to live with her husband and is able to maintain ADLs and IADLs independently.  No further concerns at this time.  *Upon evaluation/assessment, left hemiparesis noted along with left facial droop.  Upon further questioning, she does endorse noticing slight difficulty with raising left leg such as when she puts pants on.  She denies difficulty using left arm/hand.  She is right-hand dominant.  She has been experiencing increasing mouth breathing at night and will wake up with a dry mouth which she has not experienced previously.  She denies numbness/tingling or speech difficulty.  Stroke/TIA admission 04/25/2019: Ms. SHERAE SANTINO is a 85 y.o. female with history of HTN and HLD   who presented to [facility] ED on 04/25/2019 with L sided weakness and numbness.  CT head negative for acute abnormality but did show old basal ganglia infarcts.  CTA head/neck showed left P2 high-grade stenosis but no evidence of LVO.  MRI negative for acute abnormalities but again showed evidence of dominant basal ganglia and hemispheric white matter ischemic changes.  2D echo unremarkable.  LDL 143 and A1c 5.3.  Initiated DAPT for 3 weeks and aspirin alone.  HTN stable.  Initiated Crestor as she declined atorvastatin for HLD management.  Other stroke risk factors include advanced age and prior history of stroke on imaging.     ROS:   14 system review of systems performed and negative with exception of those listed in HPI  PMH:  Past Medical History:  Diagnosis Date  . Arthritis   . GERD (gastroesophageal reflux disease)   . H/O hiatal hernia   . Hyperlipidemia   . Hypertension   . Hypothyroidism   . Irritable bowel syndrome   . Macular degeneration    Exudative - OD    PSH:  Past Surgical History:  Procedure Laterality Date  . CATARACT EXTRACTION    . EYE SURGERY Bilateral    cataract surgery w/ lens implant  . FRACTURE SURGERY     right hip  . LUMBAR LAMINECTOMY/DECOMPRESSION MICRODISCECTOMY Left 05/25/2014   Procedure: LUMBAR LAMINECTOMY/DECOMPRESSION MICRODISCECTOMY 1 LEVEL   lumbar three;  Surgeon: Hosie Spangle, MD;  Location: [facility];  Service: Neurosurgery;  Laterality: Left;  . THYROID SURGERY      Social History:  Social History   Socioeconomic History  . Marital status: Married    Spouse name: Not on file  . Number of children: Not on file  . Years of education: Not on file  . Highest education level: Not on file  Occupational History  . Not on file  Tobacco Use  . Smoking status: Never Smoker  . Smokeless tobacco: Never Used  Vaping Use  . Vaping Use: Never used  Substance and Sexual Activity  . Alcohol use: No  . Drug use: Never  . Sexual activity:  Not on file  Other Topics Concern  . Not on file  Social History Narrative  . Not on file   Social Determinants of Health   Financial Resource Strain:   . Difficulty of Paying Living Expenses: Not on file  Food Insecurity:   . Worried About Charity fundraiser in the Last Year: Not on file  . Ran Out of Food in the Last Year: Not on file  Transportation Needs:   . Lack of Transportation (Medical): Not on file  . Lack of Transportation (Non-Medical): Not on file  Physical Activity:   . Days of Exercise per Week: Not on file  . Minutes of Exercise per Session: Not on file  Stress:   . Feeling of Stress : Not on file  Social Connections:   . Frequency of Communication with Friends and Family: Not on file  . Frequency of Social Gatherings with Friends and Family: Not on file  . Attends Religious Services: Not on file  . Active Member of Clubs or Organizations: Not on file  . Attends Archivist Meetings: Not on file  . Marital Status: Not on file  Intimate Partner Violence:   . Fear of Current or Ex-Partner: Not on file  . Emotionally Abused: Not on file  . Physically Abused: Not on file  . Sexually Abused: Not on file    Family History:  Family History  Problem Relation Age of Onset  . Macular degeneration Mother     Medications:   Current Outpatient Medications on File Prior to Visit  Medication Sig Dispense Refill  . Ascorbic Acid (VITAMIN C) 100 MG tablet Take 1 tablet (100 mg total) by mouth 2 (two) times daily. Take 1 tablet twice daily with ferrous sulfate 60 tablet 3  . aspirin EC 81 MG EC tablet Take 1 tablet (81 mg total) by mouth daily.    . cetirizine (ZYRTEC) 10 MG tablet Take 5 mg by mouth at bedtime as needed for allergies.     . cholecalciferol (VITAMIN D) 1000 UNITS tablet Take 1,000 Units by mouth every morning.     . gabapentin (NEURONTIN) 300 MG capsule Take 3 capsules (900 mg total) by mouth at bedtime. (Patient taking differently: Take 600 mg  by mouth at bedtime. ) 270 capsule 3  . hydrALAZINE (APRESOLINE) 50 MG tablet TAKE 1 TABLET BY MOUTH 3  TIMES DAILY (Patient taking differently: Take 50 mg by mouth in the morning and at bedtime. ) 270 tablet 3  . levothyroxine (SYNTHROID) 88 MCG tablet Take 88 mcg by mouth daily before breakfast.     . losartan (COZAAR) 100 MG tablet Take 1 tablet (100 mg total) by mouth daily. (Patient taking differently: Take 100 mg by mouth every evening. )    . Multiple Vitamins-Minerals (PRESERVISION AREDS) TABS Take 1 tablet by mouth in the morning and at bedtime.    . ondansetron (ZOFRAN) 4 MG tablet Take 4 mg by mouth at bedtime.    . pantoprazole (PROTONIX) 40 MG tablet Take 40 mg by mouth daily as needed (indigestion).     . Probiotic Product (ALIGN) 4 MG CAPS Take 4 mg by mouth daily.    . rosuvastatin (CRESTOR) 5 MG tablet Take 5 mg by mouth at bedtime.    . vitamin B-12 (CYANOCOBALAMIN) 100 MCG tablet Take 100 mcg by mouth daily.    . rosuvastatin (CRESTOR) 5 MG tablet Take 1 tablet (5 mg total) by mouth at bedtime. 30 tablet 0   No current facility-administered medications on file prior to visit.    Allergies:   Allergies  Allergen Reactions  . Sulfa Antibiotics Hives  . Carvedilol Rash     Physical Exam  Vitals:   08/14/20 0945  BP: 122/64  Pulse: (!) 113  Weight: 113 lb (51.3 kg)  Height: 5\' 4"  (1.626 m)   Body mass index is 19.4 kg/m. No exam data present  General: Frail very pleasant elderly Caucasian female, seated, in no evident distress Head: head normocephalic and atraumatic.   Neck: supple with no carotid or  supraclavicular bruits Cardiovascular: regular rate and rhythm, no murmurs Musculoskeletal: no deformity Skin:  no rash/petichiae Vascular:  Normal pulses all extremities   Neurologic Exam Mental Status: Awake and fully alert.   Fluent speech/language.  Oriented to place and time. Recent and remote memory intact. Attention span, concentration and fund of  knowledge appropriate. Mood and affect appropriate.  Cranial Nerves:  Pupils equal, briskly reactive to light. Extraocular movements full without nystagmus. Visual fields full to confrontation. Hearing intact. Facial sensation intact.   Motor: Normal bulk and tone.  Full strength in all tested extremities except mild bilateral hip flexor weakness Sensory.: intact to touch , pinpri ck , position and vibratory sensation.  Coordination: Rapid movements symmetrical.  Finger-to-nose performed accurately bilaterally but difficulty performing heel-to-shin due to lower extremity weakness Gait and Station: Arises from chair with mild difficulty. Stance is slightly hunched.  Gait abnormality with slow cautious steps and use of rolling walker Reflexes: 1+ and symmetric. Toes downgoing.         ASSESSMENT: KINSEY KARCH is a 85 y.o. year old female who presented to [facility] ED on 04/25/2019 with left-sided weakness and numbness with diagnosis of right brain TIA secondary to small vessel disease.  Vascular risk factors include HTN, HLD and prior stroke on imaging.  Recent thoracic fracture in June requiring kyphoplasty -mild deconditioning but overall improving    PLAN:  1. TIA: Stable without new recurrent stroke/TIA symptoms.  Continue aspirin 81 mg daily  and Crestor for secondary stroke prevention.  Discussed importance of close PCP follow-up for aggressive stroke risk factor management. 2. PLMD: Likely contributing to insomnia and daytime fatigue.  Recommend taking prescribed dose of gabapentin 900 mg nightly.  Previously had low ferritin levels but difficulty tolerating iron supplement.  Continued difficulty with hyponatremia which may also be contributing.  Will request recent lab work by PCP.  3. HTN: BP goal<130/90.  Stable.  Managed by PCP. 4. HLD: LDL goal<70.  Recent LDL 40 (01/2020).  Remains on Crestor.  Managed and prescribed by PCP.    Follow-up in 6 months or call earlier if needed   I  spent 30 minutes of face-to-face and non-face-to-face time with patient and daughter.  This included previsit chart review, lab review, study review, order entry, electronic health record documentation, patient education and discussion regarding history of TIA, importance of managing stroke risk factors, PLMD and continue insomnia with daytime fatigue, and answered all questions to patient and daughters satisfaction   Frann Rider, AGNP-BC  [facility] [address] [place], [place] [address]  Phone [phone] Fax [phone] Note: This document was prepared with digital dictation and possible smart phrase technology. Any transcriptional errors that result from this process are unintentional.

## 2020-08-15 ENCOUNTER — Telehealth: Payer: Self-pay | Admitting: *Deleted

## 2020-08-15 NOTE — Telephone Encounter (Signed)
Request faxed over to Dr [name] [phone]

## 2020-08-15 NOTE — Progress Notes (Signed)
I agree with the above plan 

## 2020-09-03 ENCOUNTER — Telehealth: Payer: Self-pay | Admitting: Adult Health

## 2020-09-03 NOTE — Telephone Encounter (Signed)
R/c notes From Dr Sabra Heck. Notes on sandy desk.

## 2020-09-03 NOTE — Telephone Encounter (Signed)
I called pt and she is asking to go off of the gabapentin due to toes feel funny, feels like skin peeling, extra tight feeling.  upt to ankle and leg.  She states she does not have any swelling (I asked if she had any indentation when presses finger there she states no.,).  She has ben on this since last year.  Would like to try coming off it and see if it would resolve.  I relayed that she is oof today will be back to tomorrow and can address then.  She was ok to do.

## 2020-09-03 NOTE — Telephone Encounter (Signed)
Pt is wanting to discuss a symptom that she is having with her gabapentin (NEURONTIN) 300 MG capsule Pt states she is feeling strange in her legs and is wanting to know if it has to do with the medication and if so if she can stop the medication. Please advise.

## 2020-09-04 NOTE — Telephone Encounter (Signed)
As she has been on gabapentin for a longer duration, would recommend decreasing to 600 mg nightly for 1 week and then decrease to 300 mg nightly for 1 week and then can discontinue.

## 2020-09-04 NOTE — Telephone Encounter (Signed)
She can trial decreasing dosage but low suspicion for medication side effect as she has been on this medication for some time.  She can try to decrease dose and see if symptoms resolve.  If they do not resolve, recommend she follow-up with PCP for further evaluation

## 2020-09-04 NOTE — Telephone Encounter (Signed)
I spoke to pt.  Relayed that due to being on gabapentin for sometime now, did not think this causing the issue.  She can try tapering the gabepentin from 900mg  po qhs, to 600mg  po qhs for 2 days then 300mg  po qhs for 2 days then stop.  If sx do not resolve she is to see pcp for evaluation.  She verbalized understanding.

## 2020-09-04 NOTE — Telephone Encounter (Signed)
I called pt and reinstructed on the taper of the gabapentin 600mg  po for once week then 300mg  po for one week then stop.  She did verbalized understanding.

## 2020-09-15 ENCOUNTER — Emergency Department (HOSPITAL_COMMUNITY): Payer: Medicare Other

## 2020-09-15 ENCOUNTER — Other Ambulatory Visit: Payer: Self-pay

## 2020-09-15 ENCOUNTER — Inpatient Hospital Stay (HOSPITAL_COMMUNITY)
Admission: EM | Admit: 2020-09-15 | Discharge: 2020-09-19 | DRG: 393 | Disposition: A | Discharge status: Home / Self Care | Payer: Medicare Other | Attending: Internal Medicine | Admitting: Internal Medicine

## 2020-09-15 DIAGNOSIS — K5731 Diverticulosis of large intestine without perforation or abscess with bleeding: Secondary | ICD-10-CM | POA: Diagnosis present

## 2020-09-15 DIAGNOSIS — E785 Hyperlipidemia, unspecified: Secondary | ICD-10-CM | POA: Diagnosis present

## 2020-09-15 DIAGNOSIS — K219 Gastro-esophageal reflux disease without esophagitis: Secondary | ICD-10-CM | POA: Diagnosis present

## 2020-09-15 DIAGNOSIS — R319 Hematuria, unspecified: Secondary | ICD-10-CM | POA: Diagnosis present

## 2020-09-15 DIAGNOSIS — E871 Hypo-osmolality and hyponatremia: Secondary | ICD-10-CM | POA: Diagnosis present

## 2020-09-15 DIAGNOSIS — Z7982 Long term (current) use of aspirin: Secondary | ICD-10-CM

## 2020-09-15 DIAGNOSIS — K589 Irritable bowel syndrome without diarrhea: Secondary | ICD-10-CM | POA: Diagnosis present

## 2020-09-15 DIAGNOSIS — Z66 Do not resuscitate: Secondary | ICD-10-CM | POA: Diagnosis present

## 2020-09-15 DIAGNOSIS — Z888 Allergy status to other drugs, medicaments and biological substances status: Secondary | ICD-10-CM

## 2020-09-15 DIAGNOSIS — E43 Unspecified severe protein-calorie malnutrition: Secondary | ICD-10-CM | POA: Diagnosis present

## 2020-09-15 DIAGNOSIS — J90 Pleural effusion, not elsewhere classified: Secondary | ICD-10-CM | POA: Diagnosis present

## 2020-09-15 DIAGNOSIS — D122 Benign neoplasm of ascending colon: Principal | ICD-10-CM | POA: Diagnosis present

## 2020-09-15 DIAGNOSIS — H3530 Unspecified macular degeneration: Secondary | ICD-10-CM | POA: Diagnosis present

## 2020-09-15 DIAGNOSIS — Z6821 Body mass index (BMI) 21.0-21.9, adult: Secondary | ICD-10-CM

## 2020-09-15 DIAGNOSIS — K56699 Other intestinal obstruction unspecified as to partial versus complete obstruction: Secondary | ICD-10-CM | POA: Diagnosis present

## 2020-09-15 DIAGNOSIS — R002 Palpitations: Secondary | ICD-10-CM | POA: Diagnosis present

## 2020-09-15 DIAGNOSIS — D509 Iron deficiency anemia, unspecified: Secondary | ICD-10-CM | POA: Diagnosis present

## 2020-09-15 DIAGNOSIS — I340 Nonrheumatic mitral (valve) insufficiency: Secondary | ICD-10-CM | POA: Diagnosis not present

## 2020-09-15 DIAGNOSIS — R9431 Abnormal electrocardiogram [ECG] [EKG]: Secondary | ICD-10-CM | POA: Diagnosis not present

## 2020-09-15 DIAGNOSIS — Z882 Allergy status to sulfonamides status: Secondary | ICD-10-CM

## 2020-09-15 DIAGNOSIS — I2510 Atherosclerotic heart disease of native coronary artery without angina pectoris: Secondary | ICD-10-CM | POA: Diagnosis present

## 2020-09-15 DIAGNOSIS — I471 Supraventricular tachycardia: Secondary | ICD-10-CM | POA: Diagnosis present

## 2020-09-15 DIAGNOSIS — R627 Adult failure to thrive: Secondary | ICD-10-CM | POA: Diagnosis present

## 2020-09-15 DIAGNOSIS — I4581 Long QT syndrome: Secondary | ICD-10-CM | POA: Diagnosis present

## 2020-09-15 DIAGNOSIS — I10 Essential (primary) hypertension: Secondary | ICD-10-CM | POA: Diagnosis present

## 2020-09-15 DIAGNOSIS — R54 Age-related physical debility: Secondary | ICD-10-CM | POA: Diagnosis present

## 2020-09-15 DIAGNOSIS — Z20822 Contact with and (suspected) exposure to covid-19: Secondary | ICD-10-CM | POA: Diagnosis present

## 2020-09-15 DIAGNOSIS — E039 Hypothyroidism, unspecified: Secondary | ICD-10-CM | POA: Diagnosis present

## 2020-09-15 DIAGNOSIS — Z79899 Other long term (current) drug therapy: Secondary | ICD-10-CM

## 2020-09-15 DIAGNOSIS — R0682 Tachypnea, not elsewhere classified: Secondary | ICD-10-CM | POA: Diagnosis present

## 2020-09-15 DIAGNOSIS — M1990 Unspecified osteoarthritis, unspecified site: Secondary | ICD-10-CM | POA: Diagnosis present

## 2020-09-15 DIAGNOSIS — I498 Other specified cardiac arrhythmias: Secondary | ICD-10-CM | POA: Diagnosis not present

## 2020-09-15 DIAGNOSIS — D120 Benign neoplasm of cecum: Secondary | ICD-10-CM | POA: Diagnosis present

## 2020-09-15 DIAGNOSIS — R195 Other fecal abnormalities: Secondary | ICD-10-CM | POA: Diagnosis present

## 2020-09-15 DIAGNOSIS — Z79890 Hormone replacement therapy (postmenopausal): Secondary | ICD-10-CM

## 2020-09-15 DIAGNOSIS — R634 Abnormal weight loss: Secondary | ICD-10-CM | POA: Diagnosis present

## 2020-09-15 DIAGNOSIS — E876 Hypokalemia: Secondary | ICD-10-CM | POA: Diagnosis present

## 2020-09-15 DIAGNOSIS — Z8673 Personal history of transient ischemic attack (TIA), and cerebral infarction without residual deficits: Secondary | ICD-10-CM

## 2020-09-15 DIAGNOSIS — K449 Diaphragmatic hernia without obstruction or gangrene: Secondary | ICD-10-CM | POA: Diagnosis present

## 2020-09-15 DIAGNOSIS — T50B95A Adverse effect of other viral vaccines, initial encounter: Secondary | ICD-10-CM | POA: Diagnosis present

## 2020-09-15 LAB — CBC WITH DIFFERENTIAL/PLATELET
Abs Immature Granulocytes: 0.04 10*3/uL (ref 0.00–0.07)
Basophils Absolute: 0.1 10*3/uL (ref 0.0–0.1)
Basophils Relative: 1 %
Eosinophils Absolute: 0 10*3/uL (ref 0.0–0.5)
Eosinophils Relative: 0 %
HCT: 32 % — ABNORMAL LOW (ref 36.0–46.0)
Hemoglobin: 10.3 g/dL — ABNORMAL LOW (ref 12.0–15.0)
Immature Granulocytes: 0 %
Lymphocytes Relative: 9 %
Lymphs Abs: 0.8 10*3/uL (ref 0.7–4.0)
MCH: 30.7 pg (ref 26.0–34.0)
MCHC: 32.2 g/dL (ref 30.0–36.0)
MCV: 95.5 fL (ref 80.0–100.0)
Monocytes Absolute: 1.1 10*3/uL — ABNORMAL HIGH (ref 0.1–1.0)
Monocytes Relative: 12 %
Neutro Abs: 7.3 10*3/uL (ref 1.7–7.7)
Neutrophils Relative %: 78 %
Platelets: 313 10*3/uL (ref 150–400)
RBC: 3.35 MIL/uL — ABNORMAL LOW (ref 3.87–5.11)
RDW: 14.7 % (ref 11.5–15.5)
WBC: 9.3 10*3/uL (ref 4.0–10.5)
nRBC: 0 % (ref 0.0–0.2)

## 2020-09-15 LAB — URINALYSIS, ROUTINE W REFLEX MICROSCOPIC
Bilirubin Urine: NEGATIVE
Glucose, UA: NEGATIVE mg/dL
Ketones, ur: NEGATIVE mg/dL
Leukocytes,Ua: NEGATIVE
Nitrite: NEGATIVE
Protein, ur: 30 mg/dL — AB
RBC / HPF: >50 RBC/hpf — ABNORMAL HIGH (ref 0–5)
Specific Gravity, Urine: 1.011 (ref 1.005–1.030)
pH: 8 (ref 5.0–8.0)

## 2020-09-15 LAB — COMPREHENSIVE METABOLIC PANEL
ALT: 13 U/L (ref 0–44)
AST: 19 U/L (ref 15–41)
Albumin: 2.1 g/dL — ABNORMAL LOW (ref 3.5–5.0)
Alkaline Phosphatase: 58 U/L (ref 38–126)
Anion gap: 8 (ref 5–15)
BUN: 15 mg/dL (ref 8–23)
CO2: 26 mmol/L (ref 22–32)
Calcium: 7.8 mg/dL — ABNORMAL LOW (ref 8.9–10.3)
Chloride: 99 mmol/L (ref 98–111)
Creatinine, Ser: 0.88 mg/dL (ref 0.44–1.00)
GFR, Estimated: >60 mL/min (ref >60)
Glucose, Bld: 111 mg/dL — ABNORMAL HIGH (ref 70–99)
Potassium: 3.3 mmol/L — ABNORMAL LOW (ref 3.5–5.1)
Sodium: 133 mmol/L — ABNORMAL LOW (ref 135–145)
Total Bilirubin: 0.5 mg/dL (ref 0.3–1.2)
Total Protein: 5.4 g/dL — ABNORMAL LOW (ref 6.5–8.1)

## 2020-09-15 LAB — RESPIRATORY PANEL BY RT PCR (FLU A&B, COVID)
Influenza A by PCR: NEGATIVE
Influenza B by PCR: NEGATIVE
SARS Coronavirus 2 by RT PCR: NEGATIVE

## 2020-09-15 LAB — LIPASE, BLOOD: Lipase: 51 U/L (ref 11–51)

## 2020-09-15 LAB — TROPONIN I (HIGH SENSITIVITY)
Troponin I (High Sensitivity): 14 ng/L (ref <18)
Troponin I (High Sensitivity): 15 ng/L (ref <18)

## 2020-09-15 LAB — MAGNESIUM: Magnesium: 1.7 mg/dL (ref 1.7–2.4)

## 2020-09-15 LAB — BRAIN NATRIURETIC PEPTIDE: B Natriuretic Peptide: 223.8 pg/mL — ABNORMAL HIGH (ref 0.0–100.0)

## 2020-09-15 LAB — POC OCCULT BLOOD, ED: Fecal Occult Bld: POSITIVE — AB

## 2020-09-15 MED ORDER — HYDRALAZINE HCL 50 MG PO TABS
50.0000 mg | ORAL_TABLET | Freq: Two times a day (BID) | ORAL | Status: DC
  Administered 2020-09-15 – 2020-09-19 (×8): 50 mg via ORAL
  Filled 2020-09-15 (×4): qty 1
  Filled 2020-09-15: qty 2
  Filled 2020-09-15 (×2): qty 1
  Filled 2020-09-15: qty 2

## 2020-09-15 MED ORDER — ONDANSETRON HCL 4 MG/2ML IJ SOLN
4.0000 mg | Freq: Four times a day (QID) | INTRAMUSCULAR | Status: DC | PRN
  Administered 2020-09-17 (×2): 4 mg via INTRAVENOUS
  Filled 2020-09-15 (×2): qty 2

## 2020-09-15 MED ORDER — ONDANSETRON HCL 4 MG PO TABS: 4.0000 mg | ORAL_TABLET | Freq: Four times a day (QID) | ORAL | Status: DC | PRN

## 2020-09-15 MED ORDER — ONDANSETRON HCL 4 MG/2ML IJ SOLN
4.0000 mg | Freq: Once | INTRAMUSCULAR | Status: AC
  Administered 2020-09-15: 4 mg via INTRAVENOUS
  Filled 2020-09-15: qty 2

## 2020-09-15 MED ORDER — ACETAMINOPHEN 325 MG PO TABS: 650.0000 mg | ORAL_TABLET | Freq: Four times a day (QID) | ORAL | Status: DC | PRN

## 2020-09-15 MED ORDER — ADULT MULTIVITAMIN W/MINERALS CH
1.0000 | ORAL_TABLET | Freq: Every day | ORAL | Status: DC
  Administered 2020-09-15 – 2020-09-19 (×5): 1 via ORAL
  Filled 2020-09-15 (×5): qty 1

## 2020-09-15 MED ORDER — VITAMIN B-12 100 MCG PO TABS
100.0000 ug | ORAL_TABLET | Freq: Every day | ORAL | Status: DC
  Administered 2020-09-15 – 2020-09-16 (×2): 100 ug via ORAL
  Filled 2020-09-15 (×3): qty 1

## 2020-09-15 MED ORDER — LOSARTAN POTASSIUM 50 MG PO TABS
100.0000 mg | ORAL_TABLET | Freq: Every evening | ORAL | Status: DC
  Administered 2020-09-15 – 2020-09-18 (×4): 100 mg via ORAL
  Filled 2020-09-15 (×4): qty 2

## 2020-09-15 MED ORDER — PANTOPRAZOLE SODIUM 40 MG IV SOLR
40.0000 mg | Freq: Two times a day (BID) | INTRAVENOUS | Status: DC
  Administered 2020-09-15 – 2020-09-17 (×4): 40 mg via INTRAVENOUS
  Filled 2020-09-15 (×4): qty 40

## 2020-09-15 MED ORDER — THIAMINE HCL 100 MG PO TABS
100.0000 mg | ORAL_TABLET | Freq: Every day | ORAL | Status: DC
  Administered 2020-09-15 – 2020-09-19 (×5): 100 mg via ORAL
  Filled 2020-09-15 (×5): qty 1

## 2020-09-15 MED ORDER — ENSURE ENLIVE PO LIQD
237.0000 mL | Freq: Two times a day (BID) | ORAL | Status: DC
  Administered 2020-09-16 – 2020-09-18 (×3): 237 mL via ORAL
  Filled 2020-09-15 (×3): qty 237

## 2020-09-15 MED ORDER — ROSUVASTATIN CALCIUM 5 MG PO TABS
5.0000 mg | ORAL_TABLET | Freq: Every day | ORAL | Status: DC
  Administered 2020-09-15 – 2020-09-18 (×4): 5 mg via ORAL
  Filled 2020-09-15 (×4): qty 1

## 2020-09-15 MED ORDER — PANTOPRAZOLE SODIUM 40 MG PO TBEC: 40.0000 mg | DELAYED_RELEASE_TABLET | Freq: Every day | ORAL | Status: DC | PRN

## 2020-09-15 MED ORDER — LORATADINE 10 MG PO TABS
10.0000 mg | ORAL_TABLET | Freq: Every day | ORAL | Status: DC
  Filled 2020-09-15 (×2): qty 1

## 2020-09-15 MED ORDER — POTASSIUM CHLORIDE CRYS ER 20 MEQ PO TBCR
40.0000 meq | EXTENDED_RELEASE_TABLET | Freq: Once | ORAL | Status: AC
  Administered 2020-09-15: 40 meq via ORAL
  Filled 2020-09-15: qty 2

## 2020-09-15 MED ORDER — OXYCODONE HCL 5 MG PO TABS: 5.0000 mg | ORAL_TABLET | ORAL | Status: DC | PRN

## 2020-09-15 MED ORDER — LEVOTHYROXINE SODIUM 88 MCG PO TABS: 88.0000 ug | ORAL_TABLET | Freq: Every day | ORAL | Status: DC

## 2020-09-15 MED ORDER — SODIUM CHLORIDE 0.9 % IV BOLUS
1000.0000 mL | Freq: Once | INTRAVENOUS | Status: AC
  Administered 2020-09-15: 1000 mL via INTRAVENOUS

## 2020-09-15 MED ORDER — ACETAMINOPHEN 650 MG RE SUPP: 650.0000 mg | Freq: Four times a day (QID) | RECTAL | Status: DC | PRN

## 2020-09-15 MED ORDER — TRAZODONE HCL 50 MG PO TABS: 25.0000 mg | ORAL_TABLET | Freq: Every evening | ORAL | Status: DC | PRN

## 2020-09-15 MED ORDER — GABAPENTIN 300 MG PO CAPS
900.0000 mg | ORAL_CAPSULE | Freq: Every day | ORAL | Status: DC
  Administered 2020-09-15: 900 mg via ORAL
  Filled 2020-09-15 (×2): qty 3

## 2020-09-15 MED ORDER — POLYETHYLENE GLYCOL 3350 17 G PO PACK: 17.0000 g | PACK | Freq: Every day | ORAL | Status: DC | PRN

## 2020-09-15 NOTE — ED Triage Notes (Signed)
Pt arrives via EMS from home with complaints of generalized weakness, fatigue and nausea. Pt got Covid booster 09/13/2020. EMS reports pt acute onset of  of Afib with rate of 80-150  Pt is alert and oriented X4  192/108/ 98.2 97%^

## 2020-09-15 NOTE — ED Notes (Signed)
Kristine Linea daughter [phone] would like an update on the pt

## 2020-09-15 NOTE — ED Provider Notes (Signed)
°  Physical Exam  BP (!) 187/92 (BP Location: Right Arm)    Pulse 95    Temp 97.7 F (36.5 C) (Oral)    Resp 19    SpO2 98%   Physical Exam Exam conducted with a chaperone present.  Cardiovascular:     Rate and Rhythm: Normal rate.     Heart sounds: No murmur heard.  No gallop.   Pulmonary:     Effort: Pulmonary effort is normal.     Breath sounds: Normal breath sounds. No wheezing or rales.  Abdominal:     General: There is no distension.     Palpations: Abdomen is soft.     Tenderness: There is no abdominal tenderness.  Genitourinary:    Rectum: Guaiac result positive. No mass or tenderness.  Skin:    Coloration: Skin is pale.     ED Course/Procedures      Procedures  MDM  Assumed care from offgoing provider at 1500. Patient presented for a couple days of fatigue, malaise. With EMS, had variations of HR and rhythm concerning for possible afib. EKG here with sinus rhythm with occasional PACs.   CXR with new bilateral pleural effusions as interpreted by myself and radiology. CBC with new anemia, CMP with worsening hypoalbuminemia and hypokalemia, provided K repletion. Elevated BNP.  Reevaluated patient, rectal exam with hemoccult positive stool. Patient orthostatic with marked increase in HR. Repeat EKG with possible SVT.  Patient also reports several months of no appetite, decreased PO intake, weight loss of 40lbs over last 3 months or so. Unclear etiology of symptoms, possibly malignancy  Consulted hospitalist for admission for FTT, new anemia with orthostatic tachycardia, hypokalemia. Patient accepted to their service with no further acute events while under my care.   The care of this patient was overseen by Dr. Ralene Bathe, ED attending, who agreed with evaluation and management.    Launa Flight, MD 09/16/20 Mayra Reel, MD 09/16/20 1840

## 2020-09-15 NOTE — ED Provider Notes (Signed)
[facility] Provider Note   CSN: [id number] Arrival date & time: 09/15/20  1415     History Chief Complaint  Patient presents with  . Weakness    Tammy Allison is a 85 y.o. female with past medical history of hypertension, GERD, TIA, hyponatremia requiring admission, presenting to the emergency department with complaint of generalized weakness and fatigue that began this morning.  She states this morning she felt all of a sudden nauseated with generalized weakness and fatigue.  She also has had diarrhea today.  She denies associated chest pain, shortness of breath, palpitations, abdominal pain, urinary symptoms, fevers or chills.  She states she had the Covid booster 2 days ago on the 28th, felt fine yesterday.  She did not have any significant reaction to the first 2 vaccines.  EMS reports she had sudden arrhythmia in route which was thought to be A. fib, however converted back to sinus rhythm without intervention.  No known history of A. fib.  Not on anticoagulation.     EMS ECG strips -- my read appears to be sinus rhythm with PACs.  The history is provided by the patient.       Past Medical History:  Diagnosis Date  . Arthritis   . GERD (gastroesophageal reflux disease)   . H/O hiatal hernia   . Hyperlipidemia   . Hypertension   . Hypothyroidism   . Irritable bowel syndrome   . Macular degeneration    Exudative - OD    Patient Active Problem List   Diagnosis Date Noted  . Hypocalcemia 04/25/2019  . Cerebral thrombosis with transient ischemic attack (TIA) 04/25/2019  . Snoring 03/17/2019  . Nausea alone 05/31/2014  . Malnutrition of moderate degree ([place]) 05/30/2014  . Weakness 05/29/2014  . Tachycardia 05/29/2014  . HNP (herniated nucleus pulposus), lumbar 05/25/2014  . Electrolyte abnormality 05/12/2014  . Hypokalemia 05/12/2014  . Hyponatremia 05/12/2014  . Lumbosacral radiculopathy 05/12/2014  . Hematuria  05/12/2014  . Benign essential hypertension 05/12/2014  . Hyperlipidemia LDL goal <100 05/12/2014  . Hypothyroid 05/12/2014    Past Surgical History:  Procedure Laterality Date  . CATARACT EXTRACTION    . EYE SURGERY Bilateral    cataract surgery w/ lens implant  . FRACTURE SURGERY     right hip  . LUMBAR LAMINECTOMY/DECOMPRESSION MICRODISCECTOMY Left 05/25/2014   Procedure: LUMBAR LAMINECTOMY/DECOMPRESSION MICRODISCECTOMY 1 LEVEL   lumbar three;  Surgeon: Hosie Spangle, MD;  Location: [facility];  Service: Neurosurgery;  Laterality: Left;  . THYROID SURGERY       OB History   No obstetric history on file.     Family History  Problem Relation Age of Onset  . Macular degeneration Mother     Social History   Tobacco Use  . Smoking status: Never Smoker  . Smokeless tobacco: Never Used  Vaping Use  . Vaping Use: Never used  Substance Use Topics  . Alcohol use: No  . Drug use: Never    Home Medications Prior to Admission medications   Medication Sig Start Date End Date Taking? Authorizing Provider  Ascorbic Acid (VITAMIN C) 100 MG tablet Take 1 tablet (100 mg total) by mouth 2 (two) times daily. Take 1 tablet twice daily with ferrous sulfate 10/11/19   Frann Rider, NP  aspirin EC 81 MG EC tablet Take 1 tablet (81 mg total) by mouth daily. 04/27/19   Geradine Girt, DO  cetirizine (ZYRTEC) 10 MG tablet Take 5 mg  by mouth at bedtime as needed for allergies.     [provider]  cholecalciferol (VITAMIN D) 1000 UNITS tablet Take 1,000 Units by mouth every morning.     [provider]  gabapentin (NEURONTIN) 300 MG capsule Take 3 capsules (900 mg total) by mouth at bedtime. 08/14/20   Frann Rider, NP  hydrALAZINE (APRESOLINE) 50 MG tablet TAKE 1 TABLET BY MOUTH 3  TIMES DAILY Patient taking differently: Take 50 mg by mouth in the morning and at bedtime.  01/11/20   Belva Crome, MD  levothyroxine (SYNTHROID) 88 MCG tablet Take 88 mcg by mouth daily  before breakfast.  07/20/19   [provider]  losartan (COZAAR) 100 MG tablet Take 1 tablet (100 mg total) by mouth daily. Patient taking differently: Take 100 mg by mouth every evening.  04/29/19   Geradine Girt, DO  Multiple Vitamins-Minerals (PRESERVISION AREDS) TABS Take 1 tablet by mouth in the morning and at bedtime.    [provider]  ondansetron (ZOFRAN) 4 MG tablet Take 4 mg by mouth at bedtime. 05/10/20   [provider]  pantoprazole (PROTONIX) 40 MG tablet Take 40 mg by mouth daily as needed (indigestion).     [provider]  Probiotic Product (ALIGN) 4 MG CAPS Take 4 mg by mouth daily.    [provider]  rosuvastatin (CRESTOR) 5 MG tablet Take 5 mg by mouth at bedtime.    [provider]  vitamin B-12 (CYANOCOBALAMIN) 100 MCG tablet Take 100 mcg by mouth daily.    [provider]    Allergies    Sulfa antibiotics and Carvedilol  Review of Systems   Review of Systems  Constitutional: Positive for fatigue.  Gastrointestinal: Positive for diarrhea and nausea.  All other systems reviewed and are negative.   Physical Exam Updated Vital Signs BP (!) 187/92 (BP Location: Right Arm)   Pulse 95   Temp 97.7 F (36.5 C) (Oral)   Resp 19   SpO2 98%   Physical Exam Vitals and nursing note reviewed.  Constitutional:      Appearance: She is well-developed. She is ill-appearing.  HENT:     Head: Normocephalic and atraumatic.  Eyes:     Conjunctiva/sclera: Conjunctivae normal.  Cardiovascular:     Rate and Rhythm: Normal rate and regular rhythm.     Comments: Frequent PACs, sinus rhythm Pulmonary:     Effort: Pulmonary effort is normal. No respiratory distress.     Breath sounds: Normal breath sounds.  Abdominal:     General: Bowel sounds are normal.     Palpations: Abdomen is soft.     Tenderness: There is no abdominal tenderness. There is no guarding or rebound.  Musculoskeletal:     Comments: Trace edema  BLE  Skin:    General: Skin is warm.     Coloration: Skin is pale.  Neurological:     Mental Status: She is alert.  Psychiatric:        Behavior: Behavior normal.     ED Results / Procedures / Treatments   Labs (all labs ordered are listed, but only abnormal results are displayed) Labs Reviewed  COMPREHENSIVE METABOLIC PANEL  CBC WITH DIFFERENTIAL/PLATELET  URINALYSIS, ROUTINE W REFLEX MICROSCOPIC  LIPASE, BLOOD    EKG EKG Interpretation  Date/Time:  Saturday September 15 2020 14:42:21 EDT Ventricular Rate:  98 PR Interval:    QRS Duration: 76 QT Interval:  363 QTC Calculation: 464 R Axis:   59  Text Interpretation: Sinus tachycardia Atrial premature complex Confirmed by Quintella Reichert [phone]) on 09/15/2020 3:07:35 PM   Radiology No results found.  Procedures Procedures (including critical care time)  Medications Ordered in ED Medications  sodium chloride 0.9 % bolus 1,000 mL (has no administration in time range)  ondansetron (ZOFRAN) injection 4 mg (has no administration in time range)    ED Course  I have reviewed the triage vital signs and the nursing notes.  Pertinent labs & imaging results that were available during my care of the patient were reviewed by me and considered in my medical decision making (see chart for details).    MDM Rules/Calculators/A&P                          Patient is an 85 year old female presenting by EMS for generalized weakness and fatigue with nausea and diarrhea that began today.  She had Covid booster 2 days ago, was asymptomatic yesterday.  No fevers or chills.  No abdominal pain, chest pain or shortness of breath.  EMS reported arrhythmia in route which was thought to be A. fib, however on my read of strips it appears to be sinus rhythm with PACs.  She is in sinus rhythm on evaluation with frequent PACs.  Labs ordered, chest x-ray, EKG.  IV fluids and antiemetic ordered.  Differential diagnosis includes viral GI illness  versus less likely delayed immune response to COVID booster.  Care assumed at shift change by Dr. Soyla Dryer to follow labs, reevaluate, and determine appropriate disposition.   Final Clinical Impression(s) / ED Diagnoses Final diagnoses:  None    Rx / DC Orders ED Discharge Orders    None       Calib Wadhwa, [place] N, PA-C 09/15/20 1538    Isla Pence, MD 09/16/20 [phone]

## 2020-09-15 NOTE — ED Notes (Signed)
Called pt daughter to provide update. Spoke to son-in-law. Family verbalized understanding of plan to admit.

## 2020-09-15 NOTE — ED Notes (Signed)
Juanda Crumble, husband, [phone] would like an update when available

## 2020-09-15 NOTE — ED Notes (Signed)
Ambulated not preformed due to pt heart rate when standing during orthostatic vitals.

## 2020-09-15 NOTE — H&P (Signed)
TRH H&P    Patient Demographics:    Tammy Allison, is a 85 y.o. female  MRN: [id number]  DOB - [birth year]  Admit Date - 09/15/2020  Referring MD/NP/PA: Everardo Beals  Outpatient Primary MD for the patient is [name], MD  Patient coming from: Home  Chief complaint-generalized weakness, fatigue, malaise   HPI:    Tammy Allison  is a 85 y.o. female, with history of macular degeneration, irritable bowel syndrome, hypothyroidism, hypertension, hyperlipidemia, GERD presents to the ER with a chief complaint of generalized weakness.  Patient reports that she has not been feeling well for months.  The thing that was different about today was that she felt nauseous and more weak than normal.  She reports this all started when she fell in July and had kyphoplasty.  She reports she has not been able to walk without a walker since then.  She has been in PT 2 times per week for the last 6 weeks.  She reports that she has had a steady decline since then.  She eats breakfast, and then she has no appetite and does not eat for the rest of the day.  She reports she lost 40 pounds in 1 year, but does not attribute all that to not eating as she reports she was eating normally up until July.  Patient reports that during the day she drinks one boost and maybe 8 ounces of propel and does not drink any other fluids throughout the day.  Today she reports that her nausea and weakness were gradual in onset.  They progressed throughout the day.  She is not in any pain.  Patient reports that she has been fatigued for months, but that seemed worse today to.  Patient denies any chest pain, palpitations, shortness of breath, paresthesias in her extremities.  She denies any belly pain, pain or burning when she pees, or pain in general.  She does report that she has had urinary urgency and incontinence at night but that is been going on for about 2  months.  She has not seen any gross hematuria.  She reports that she has been constipated, then today she was incontinent with diarrhea with EMS.  She reports that she had been taking stool softeners and Dulcolax Gummies-this is likely the cause of the diarrhea today.  She denies any hemorrhoids, seeing blood in her stool, and or melena.  Of note patient did have her Covid booster 2 days ago.  She also had tetanus vaccine at that time.  These could be contributing to why she has not felt well.  Chart review Last admission was June 2021.  She had presented for generalized weakness at that time and was admitted for multiple electrolyte abnormalities including hyponatremia. Last echo was in June 2020 showed a 60 to 65% ejection fraction, "impaired relaxation." Patient follows with neurology and has not had any new stroke/TIA symptoms as of her last appointment September 28.  She remains on aspirin and Crestor daily.  She complained about daytime fatigue and insomnia at that appointment, so  gabapentin dosage was increased to 900 mg. Patient has followed with cardiologist Dr. Daneen Schick.  She was referred to cardiology because of palpitations and chest pain.  Her diagnosis at that appointment was chest pain of uncertain etiology.  She had nonobstructive coronary artery disease and preventative therapy was recommended.  In the ED Temperature 97.7, heart rate 103, respiratory rate 23, blood pressure 201/90, satting at 97% White blood cell count 9.3 Sodium 133, hypokalemia at 3.3, BUN to creatinine is 15/0.88 Albumin low at 2.1 BNP elevated at 223.8 with no comparison Initial troponin is 14 repeat is pending UA was borderline showed hematuria, but 11-20 white blood cells-urine culture pending Fecal occult blood was positive Chest x-ray showed bilateral moderate pleural effusions left greater than right EKG initial showed SVT heart rate 150 4 repeat was sinus tach with a regular rate 104 1 L NaCl  given    Review of systems:    In addition to the HPI above,  Review of Systems  Constitutional: Positive for malaise/fatigue and weight loss. Negative for chills and fever.  HENT: Negative for congestion, ear pain, sinus pain and sore throat.   Eyes: Negative for pain.  Respiratory: Negative for cough, sputum production and shortness of breath.   Cardiovascular: Negative for chest pain, palpitations and leg swelling.  Gastrointestinal: Positive for constipation, diarrhea and nausea. Negative for abdominal pain, blood in stool, melena and vomiting.  Genitourinary: Positive for urgency. Negative for dysuria, frequency and hematuria.  Musculoskeletal: Negative for joint pain, myalgias and neck pain.  Skin: Negative for itching and rash.  Neurological: Positive for weakness. Negative for dizziness, focal weakness, loss of consciousness and headaches.  Psychiatric/Behavioral: Negative for substance abuse.     All other systems reviewed and are negative.    Past History of the following :    Past Medical History:  Diagnosis Date  . Arthritis   . GERD (gastroesophageal reflux disease)   . H/O hiatal hernia   . Hyperlipidemia   . Hypertension   . Hypothyroidism   . Irritable bowel syndrome   . Macular degeneration    Exudative - OD      Past Surgical History:  Procedure Laterality Date  . CATARACT EXTRACTION    . EYE SURGERY Bilateral    cataract surgery w/ lens implant  . FRACTURE SURGERY     right hip  . LUMBAR LAMINECTOMY/DECOMPRESSION MICRODISCECTOMY Left 05/25/2014   Procedure: LUMBAR LAMINECTOMY/DECOMPRESSION MICRODISCECTOMY 1 LEVEL   lumbar three;  Surgeon: Hosie Spangle, MD;  Location: [facility];  Service: Neurosurgery;  Laterality: Left;  . THYROID SURGERY        Social History:      Social History   Tobacco Use  . Smoking status: Never Smoker  . Smokeless tobacco: Never Used  Substance Use Topics  . Alcohol use: No       Family History :      Family History  Problem Relation Age of Onset  . Macular degeneration Mother       Home Medications:   Prior to Admission medications   Medication Sig Start Date End Date Taking? Authorizing Provider  Ascorbic Acid (VITAMIN C) 100 MG tablet Take 1 tablet (100 mg total) by mouth 2 (two) times daily. Take 1 tablet twice daily with ferrous sulfate 10/11/19   Frann Rider, NP  aspirin EC 81 MG EC tablet Take 1 tablet (81 mg total) by mouth daily. 04/27/19   Geradine Girt, DO  cetirizine (ZYRTEC) 10  MG tablet Take 5 mg by mouth at bedtime as needed for allergies.     [provider]  cholecalciferol (VITAMIN D) 1000 UNITS tablet Take 1,000 Units by mouth every morning.     [provider]  gabapentin (NEURONTIN) 300 MG capsule Take 3 capsules (900 mg total) by mouth at bedtime. 08/14/20   Frann Rider, NP  hydrALAZINE (APRESOLINE) 50 MG tablet TAKE 1 TABLET BY MOUTH 3  TIMES DAILY Patient taking differently: Take 50 mg by mouth in the morning and at bedtime.  01/11/20   Belva Crome, MD  levothyroxine (SYNTHROID) 88 MCG tablet Take 88 mcg by mouth daily before breakfast.  07/20/19   [provider]  losartan (COZAAR) 100 MG tablet Take 1 tablet (100 mg total) by mouth daily. Patient taking differently: Take 100 mg by mouth every evening.  04/29/19   Geradine Girt, DO  Multiple Vitamins-Minerals (PRESERVISION AREDS) TABS Take 1 tablet by mouth in the morning and at bedtime.    [provider]  ondansetron (ZOFRAN) 4 MG tablet Take 4 mg by mouth at bedtime. 05/10/20   [provider]  pantoprazole (PROTONIX) 40 MG tablet Take 40 mg by mouth daily as needed (indigestion).     [provider]  Probiotic Product (ALIGN) 4 MG CAPS Take 4 mg by mouth daily.    [provider]  rosuvastatin (CRESTOR) 5 MG tablet Take 5 mg by mouth at bedtime.    [provider]  vitamin B-12 (CYANOCOBALAMIN) 100 MCG tablet Take 100 mcg by mouth  daily.    [provider]     Allergies:     Allergies  Allergen Reactions  . Sulfa Antibiotics Hives  . Carvedilol Rash     Physical Exam:   Vitals  Blood pressure (!) 159/130, pulse 97, temperature 97.7 F (36.5 C), temperature source Oral, resp. rate (!) 21, height 5\' 4"  (1.626 m), weight 52.2 kg, SpO2 94 %.  1.  General: Lying supine in bed no acute distress, nontoxic-appearing  2. Psychiatric: Mood is anxious, behavior is normal for situation Seems to have good insight into own healthcare  3. Neurologic: Cranial nerves II through XII intact, moves all 4 extremities voluntarily, hypersensitivity to the dorsal aspect of bilateral feet -no asymmetric sensory change Alert and oriented x3  4. HEENMT:  Head is atraumatic, normocephalic, pupils reactive to light, neck is supple, trachea is midline  5. Respiratory : Lungs are clear to auscultation bilaterally  6. Cardiovascular : Heart rate is tachycardic, rhythm is regular, no murmurs rubs or gallops, peripheral pulses palpated  7. Gastrointestinal:  Abdomen is soft, nondistended, nontender to palpation  8. Skin:  No acute lesion on limited skin exam  9.Musculoskeletal:  No peripheral edema or acute deformity    Data Review:    CBC Recent Labs  Lab 09/15/20 1525  WBC 9.3  HGB 10.3*  HCT 32.0*  PLT 313  MCV 95.5  MCH 30.7  MCHC 32.2  RDW 14.7  LYMPHSABS 0.8  MONOABS 1.1*  EOSABS 0.0  BASOSABS 0.1   ------------------------------------------------------------------------------------------------------------------  Results for orders placed or performed during the hospital encounter of 09/15/20 (from the past 48 hour(s))  Comprehensive metabolic panel     Status: Abnormal   Collection Time: 09/15/20  3:25 PM  Result Value Ref Range   Sodium 133 (L) 135 - 145 mmol/L   Potassium 3.3 (L) 3.5 - 5.1 mmol/L   Chloride 99 98 - 111 mmol/L   CO2  26 22 - 32 mmol/L   Glucose, Bld 111 (H) 70 - 99  mg/dL    Comment: Glucose reference range applies only to samples taken after fasting for at least 8 hours.   BUN 15 8 - 23 mg/dL   Creatinine, Ser 0.88 0.44 - 1.00 mg/dL   Calcium 7.8 (L) 8.9 - 10.3 mg/dL   Total Protein 5.4 (L) 6.5 - 8.1 g/dL   Albumin 2.1 (L) 3.5 - 5.0 g/dL   AST 19 15 - 41 U/L   ALT 13 0 - 44 U/L   Alkaline Phosphatase 58 38 - 126 U/L   Total Bilirubin 0.5 0.3 - 1.2 mg/dL   GFR, Estimated >60 >60 mL/min    Comment: (NOTE) Calculated using the CKD-EPI Creatinine Equation (2021)    Anion gap 8 5 - 15    Comment: Performed at [facility], [place] [address], [place], [place] [address]  CBC with Differential     Status: Abnormal   Collection Time: 09/15/20  3:25 PM  Result Value Ref Range   WBC 9.3 4.0 - 10.5 K/uL   RBC 3.35 (L) 3.87 - 5.11 MIL/uL   Hemoglobin 10.3 (L) 12.0 - 15.0 g/dL   HCT 32.0 (L) 36 - 46 %   MCV 95.5 80.0 - 100.0 fL   MCH 30.7 26.0 - 34.0 pg   MCHC 32.2 30.0 - 36.0 g/dL   RDW 14.7 11.5 - 15.5 %   Platelets 313 150 - 400 K/uL   nRBC 0.0 0.0 - 0.2 %   Neutrophils Relative % 78 %   Neutro Abs 7.3 1.7 - 7.7 K/uL   Lymphocytes Relative 9 %   Lymphs Abs 0.8 0.7 - 4.0 K/uL   Monocytes Relative 12 %   Monocytes Absolute 1.1 (H) 0.1 - 1.0 K/uL   Eosinophils Relative 0 %   Eosinophils Absolute 0.0 0.0 - 0.5 K/uL   Basophils Relative 1 %   Basophils Absolute 0.1 0.0 - 0.1 K/uL   Immature Granulocytes 0 %   Abs Immature Granulocytes 0.04 0.00 - 0.07 K/uL    Comment: Performed at [facility], [place] [address], [place], [place] [address]  Lipase, blood     Status: None   Collection Time: 09/15/20  3:25 PM  Result Value Ref Range   Lipase 51 11 - 51 U/L    Comment: Performed at [facility] [address], [place], [place] [address]  Brain natriuretic peptide     Status: Abnormal   Collection Time: 09/15/20  4:50 PM  Result Value Ref Range   B Natriuretic Peptide 223.8 (H) 0.0 - 100.0 pg/mL    Comment: Performed at [facility] [address], [place], [place] [address]  Troponin I (High Sensitivity)     Status: None   Collection Time: 09/15/20  4:50 PM  Result Value Ref Range   Troponin I (High Sensitivity) 14 <18 ng/L    Comment: (NOTE) Elevated high sensitivity troponin I (hsTnI) values and significant  changes across serial measurements may suggest ACS but many other  chronic and acute conditions are known to elevate hsTnI results.  Refer to the "Links" section for chest pain algorithms and additional  guidance. Performed at [facility], [place] [address] [place], [place] [address]   Urinalysis, Routine w reflex microscopic Urine, Clean Catch     Status: Abnormal   Collection Time: 09/15/20  4:52 PM  Result Value Ref Range  Color, Urine YELLOW YELLOW   APPearance CLOUDY (A) CLEAR   Specific Gravity, Urine 1.011 1.005 - 1.030   pH 8.0 5.0 - 8.0   Glucose, UA NEGATIVE NEGATIVE mg/dL   Hgb urine dipstick LARGE (A) NEGATIVE   Bilirubin Urine NEGATIVE NEGATIVE   Ketones, ur NEGATIVE NEGATIVE mg/dL   Protein, ur 30 (A) NEGATIVE mg/dL   Nitrite NEGATIVE NEGATIVE   Leukocytes,Ua NEGATIVE NEGATIVE   RBC / HPF >50 (H) 0 - 5 RBC/hpf   WBC, UA 11-20 0 - 5 WBC/hpf   Bacteria, UA FEW (A) NONE SEEN   Mucus PRESENT     Comment: Performed at [facility] [address], [place], [place] [address]  POC occult blood, ED     Status: Abnormal   Collection Time: 09/15/20  5:49 PM  Result Value Ref Range   Fecal Occult Bld POSITIVE (A) NEGATIVE    Chemistries  Recent Labs  Lab 09/15/20 1525  NA 133*  K 3.3*  CL 99  CO2 26  GLUCOSE 111*  BUN 15  CREATININE 0.88  CALCIUM 7.8*  AST 19  ALT 13  ALKPHOS 58  BILITOT 0.5   ------------------------------------------------------------------------------------------------------------------  ------------------------------------------------------------------------------------------------------------------ GFR: Estimated Creatinine  Clearance: 39.9 mL/min (by C-G formula based on SCr of 0.88 mg/dL). Liver Function Tests: Recent Labs  Lab 09/15/20 1525  AST 19  ALT 13  ALKPHOS 58  BILITOT 0.5  PROT 5.4*  ALBUMIN 2.1*   Recent Labs  Lab 09/15/20 1525  LIPASE 51   No results for input(s): AMMONIA in the last 168 hours. Coagulation Profile: No results for input(s): INR, PROTIME in the last 168 hours. Cardiac Enzymes: No results for input(s): CKTOTAL, CKMB, CKMBINDEX, TROPONINI in the last 168 hours. BNP (last 3 results) No results for input(s): PROBNP in the last 8760 hours. HbA1C: No results for input(s): HGBA1C in the last 72 hours. CBG: No results for input(s): GLUCAP in the last 168 hours. Lipid Profile: No results for input(s): CHOL, HDL, LDLCALC, TRIG, CHOLHDL, LDLDIRECT in the last 72 hours. Thyroid Function Tests: No results for input(s): TSH, T4TOTAL, FREET4, T3FREE, THYROIDAB in the last 72 hours. Anemia Panel: No results for input(s): VITAMINB12, FOLATE, FERRITIN, TIBC, IRON, RETICCTPCT in the last 72 hours.  --------------------------------------------------------------------------------------------------------------- Urine analysis:    Component Value Date/Time   COLORURINE YELLOW 09/15/2020 1652   APPEARANCEUR CLOUDY (A) 09/15/2020 1652   LABSPEC 1.011 09/15/2020 1652   PHURINE 8.0 09/15/2020 1652   GLUCOSEU NEGATIVE 09/15/2020 1652   HGBUR LARGE (A) 09/15/2020 [place] 09/15/2020 1652   [place] 09/15/2020 1652   PROTEINUR 30 (A) 09/15/2020 1652   UROBILINOGEN 0.2 05/29/2014 1122   NITRITE NEGATIVE 09/15/2020 1652   LEUKOCYTESUR NEGATIVE 09/15/2020 1652      Imaging Results:    DG Chest 2 View  Result Date: 09/15/2020 CLINICAL DATA:  Atrial fibrillation. EXAM: CHEST - 2 VIEW COMPARISON:  Mar 23, 2020 FINDINGS: Cardiomediastinal silhouette is normal. Mediastinal contours appear intact. There is no evidence of focal airspace consolidation, or  pneumothorax. Bilateral moderate pleural effusions, left greater than right. Interval vertebroplasty of the lower thoracic spine. Soft tissues are grossly normal. IMPRESSION: Bilateral moderate pleural effusions, left greater than right. Electronically Signed   By: Fidela Salisbury M.D.   On: 09/15/2020 15:53    My personal review of EKG: Rhythm sinus tach with a regular rate, Rate 104/min, QTc 486 ,no Acute ST changes   Assessment & Plan:    Active  Problems:   Failure to thrive in adult   1. Failure to thrive 1. Weakness, poor p.o. intake, fatigue, weight loss 40 pounds 2. Patient does have history of thyroid disease on Synthroid-check TSH 3. She has known B12 deficiency as well-check B12 level 4. Patient has never had a colonoscopy due to stricture per her report, now FOBT positive, hemoglobin dropped from 12.4-10.3 -consult gastro 5. Consult PT evaluate and treat 6. Protein shakes between meals 7. Encourage p.o. intake 2. Arrhythmia 1. 1 EKG shows SVT with a rate of 154, spontaneously resolved, another EKG shows sinus arrhythmia 2. Echo in the a.m. 3. Consult cardiology 4. Electrolyte disturbance could be contributing, replace and monitor electrolytes 5. Monitor on telemetry 3. Hypokalemia 1. Replace and recheck 4. Symptomatic normocytic anemia 1. MCV 95.5, hemoglobin 10.3 2. Complaining of fatigue 3. 4 months ago hemoglobin was 12.4 4. FOBT positive 5. Hold aspirin, and DVT prophylaxis 6. No other signs or symptoms of bleeding 7. Monitor H&H 8. Continue Protonix 9. N.p.o. after midnight in case gastro would like to scope 10. Continue to monitor 5. Protein calorie malnutrition 1. Albumin 2.1 2. Protein shakes between meals 3. Encourage nutrient dense food choices 4. Continue to monitor 6. Pleural effusions 1. Holding off on Lasix, as clinically patient appears dry 2. 1 L fluid given in the ED 3. Recheck chest x-ray in a.m.  4. Thoracentesis ordered 7. Thyroid  disease 1. Continue Synthroid 2. Check TSH 8. Hypertension hyperlipidemia 1. Continue home medications   DVT Prophylaxis-   SCDs   AM Labs Ordered, also please review Full Orders  Family Communication: No family at bedside  Code Status:  DNR  Admission status: Inpatient :The appropriate admission status for this patient is INPATIENT. Inpatient status is judged to be reasonable and necessary in order to provide the required intensity of service to ensure the patient's safety. The patient's presenting symptoms, physical exam findings, and initial radiographic and laboratory data in the context of their chronic comorbidities is felt to place them at high risk for further clinical deterioration. Furthermore, it is not anticipated that the patient will be medically stable for discharge from the hospital within 2 midnights of admission. The following factors support the admission status of inpatient.     The patient's presenting symptoms include fatigue, malaise, and generalized weakness The worrisome physical exam findings include tachycardia The initial radiographic and laboratory data are worrisome because of protein cal mal, hypokalemia, EKG = SVT and repeat = sinus arrhythmia  The chronic co-morbidities include IBS, hypothyroid, HTN, HLD, GERD       * I certify that at the point of admission it is my clinical judgment that the patient will require inpatient hospital care spanning beyond 2 midnights from the point of admission due to high intensity of service, high risk for further deterioration and high frequency of surveillance required.*  Time spent in minutes : [place]

## 2020-09-16 ENCOUNTER — Inpatient Hospital Stay (HOSPITAL_COMMUNITY): Payer: Medicare Other

## 2020-09-16 DIAGNOSIS — I471 Supraventricular tachycardia: Secondary | ICD-10-CM

## 2020-09-16 DIAGNOSIS — R9431 Abnormal electrocardiogram [ECG] [EKG]: Secondary | ICD-10-CM | POA: Diagnosis not present

## 2020-09-16 DIAGNOSIS — R627 Adult failure to thrive: Secondary | ICD-10-CM

## 2020-09-16 DIAGNOSIS — I340 Nonrheumatic mitral (valve) insufficiency: Secondary | ICD-10-CM

## 2020-09-16 LAB — CBC WITH DIFFERENTIAL/PLATELET
Abs Immature Granulocytes: 0.03 10*3/uL (ref 0.00–0.07)
Basophils Absolute: 0.1 10*3/uL (ref 0.0–0.1)
Basophils Relative: 1 %
Eosinophils Absolute: 0 10*3/uL (ref 0.0–0.5)
Eosinophils Relative: 0 %
HCT: 29.1 % — ABNORMAL LOW (ref 36.0–46.0)
Hemoglobin: 9.5 g/dL — ABNORMAL LOW (ref 12.0–15.0)
Immature Granulocytes: 0 %
Lymphocytes Relative: 7 %
Lymphs Abs: 0.7 10*3/uL (ref 0.7–4.0)
MCH: 31.5 pg (ref 26.0–34.0)
MCHC: 32.6 g/dL (ref 30.0–36.0)
MCV: 96.4 fL (ref 80.0–100.0)
Monocytes Absolute: 1.1 10*3/uL — ABNORMAL HIGH (ref 0.1–1.0)
Monocytes Relative: 12 %
Neutro Abs: 7.3 10*3/uL (ref 1.7–7.7)
Neutrophils Relative %: 80 %
Platelets: 279 10*3/uL (ref 150–400)
RBC: 3.02 MIL/uL — ABNORMAL LOW (ref 3.87–5.11)
RDW: 15.1 % (ref 11.5–15.5)
WBC: 9.2 10*3/uL (ref 4.0–10.5)
nRBC: 0 % (ref 0.0–0.2)

## 2020-09-16 LAB — IRON AND TIBC
Iron: 29 ug/dL (ref 28–170)
Saturation Ratios: 15 % (ref 10.4–31.8)
TIBC: 199 ug/dL — ABNORMAL LOW (ref 250–450)
UIBC: 170 ug/dL

## 2020-09-16 LAB — VITAMIN B12: Vitamin B-12: 985 pg/mL — ABNORMAL HIGH (ref 180–914)

## 2020-09-16 LAB — COMPREHENSIVE METABOLIC PANEL
ALT: 13 U/L (ref 0–44)
AST: 19 U/L (ref 15–41)
Albumin: 1.8 g/dL — ABNORMAL LOW (ref 3.5–5.0)
Alkaline Phosphatase: 47 U/L (ref 38–126)
Anion gap: 7 (ref 5–15)
BUN: 14 mg/dL (ref 8–23)
CO2: 25 mmol/L (ref 22–32)
Calcium: 7.7 mg/dL — ABNORMAL LOW (ref 8.9–10.3)
Chloride: 103 mmol/L (ref 98–111)
Creatinine, Ser: 0.94 mg/dL (ref 0.44–1.00)
GFR, Estimated: >60 mL/min (ref >60)
Glucose, Bld: 101 mg/dL — ABNORMAL HIGH (ref 70–99)
Potassium: 4 mmol/L (ref 3.5–5.1)
Sodium: 135 mmol/L (ref 135–145)
Total Bilirubin: 0.6 mg/dL (ref 0.3–1.2)
Total Protein: 4.7 g/dL — ABNORMAL LOW (ref 6.5–8.1)

## 2020-09-16 LAB — ECHOCARDIOGRAM COMPLETE
AR max vel: 2.51 cm2
AV Area VTI: 2.67 cm2
AV Area mean vel: 2.52 cm2
AV Mean grad: 4 mmHg
AV Peak grad: 6.9 mmHg
Ao pk vel: 1.31 m/s
Area-P 1/2: 2.5 cm2
Height: 64 in
S' Lateral: 3 cm
Weight: 1840 oz

## 2020-09-16 LAB — VITAMIN D 25 HYDROXY (VIT D DEFICIENCY, FRACTURES): Vit D, 25-Hydroxy: 48.63 ng/mL (ref 30–100)

## 2020-09-16 LAB — FERRITIN: Ferritin: 24 ng/mL (ref 11–307)

## 2020-09-16 LAB — T4, FREE: Free T4: 0.85 ng/dL (ref 0.61–1.12)

## 2020-09-16 LAB — MAGNESIUM: Magnesium: 1.7 mg/dL (ref 1.7–2.4)

## 2020-09-16 LAB — TSH: TSH: 19.135 u[IU]/mL — ABNORMAL HIGH (ref 0.350–4.500)

## 2020-09-16 MED ORDER — LEVOTHYROXINE SODIUM 100 MCG PO TABS
100.0000 ug | ORAL_TABLET | Freq: Every day | ORAL | Status: DC
  Administered 2020-09-16 – 2020-09-19 (×4): 100 ug via ORAL
  Filled 2020-09-16 (×4): qty 1

## 2020-09-16 MED ORDER — SODIUM CHLORIDE 0.9 % IV SOLN: INTRAVENOUS | Status: DC

## 2020-09-16 MED ORDER — GABAPENTIN 300 MG PO CAPS
300.0000 mg | ORAL_CAPSULE | Freq: Every day | ORAL | Status: DC
  Administered 2020-09-16 – 2020-09-18 (×3): 300 mg via ORAL
  Filled 2020-09-16 (×3): qty 1

## 2020-09-16 MED ORDER — METOPROLOL TARTRATE 25 MG PO TABS
25.0000 mg | ORAL_TABLET | Freq: Two times a day (BID) | ORAL | Status: DC
  Administered 2020-09-16 – 2020-09-19 (×7): 25 mg via ORAL
  Filled 2020-09-16 (×7): qty 1

## 2020-09-16 MED ORDER — PEG 3350-KCL-NA BICARB-NACL 420 G PO SOLR
4000.0000 mL | Freq: Once | ORAL | Status: AC
  Administered 2020-09-16: 4000 mL via ORAL
  Filled 2020-09-16 (×2): qty 4000

## 2020-09-16 NOTE — ED Notes (Signed)
Pt moved to Room 38; report given to [place], [place].

## 2020-09-16 NOTE — Progress Notes (Signed)
  Echocardiogram 2D Echocardiogram has been performed.  Tammy Allison 09/16/2020, 2:24 PM

## 2020-09-16 NOTE — Progress Notes (Signed)
PROGRESS NOTE    Tammy Allison  YTK:[id number] DOB: 11-12-1935 DOA: 09/15/2020 PCP: [name], MD   Brief Narrative:  Patient is 85 year old female with past medical history of macular degeneration, IBS, hypothyroidism, hypertension, hyperlipidemia, GERD presents to emergency department with generalized weakness, fatigue and malaise.  Denies hematemesis, melena, over-the-counter use of NSAIDs and not on any anticoagulation.  In ED: Patient was afebrile, tachycardic, tachypneic, blood pressure: 201/90, maintaining oxygen saturation on room air, no leukocytosis, hypokalemia with potassium 3.3.  Kidney function within normal limit.  Albumin: 2.1.  BNP: 223.8.  Initial troponin XIV.  UA was borderline showed hematuria.  Fecal occult blood positive.  Chest x-ray showed bilateral moderate pleural effusion left greater than right.  Initial EKG showed SVT with heart rate in 150s.  Repeat showed sinus tach with heart rate of 104.  Patient was given IV fluids in ED.  GI and cardiology was consulted.  Patient admitted her for further evaluation and management of symptomatic anemia and SVT.  Assessment & Plan:  Symptomatic anemia: -Patient presented with generalized weakness, fatigue, malaise, weight loss and failure to thrive.  Concern for GI malignancy. -H&H trended down from 12.4/36.8 to 9.5/29.1 in 4 months. -POC occult blood is positive. -GI consulted-appreciate help-plan is for EGD and colonoscopy -Clear liquid diet today. -We will keep her n.p.o. after midnight.  Monitor H&H closely and transfuse as needed. -Iron studies, B12 is pending -Continue Protonix IV twice daily. -Monitor vitals closely.  SVT: -Reviewed EKG and echo.  Initial troponin negative.  Patient denies ACS symptoms. -Appreciate cardiology input. -Start on metoprolol 25 mg twice daily.  Monitor on telemetry.  Protein calorie malnutrition: -Low albumin-2.1.  Protein shakes between meals. -We will consult  dietitian  Hypokalemia: Replenished -Repeat BMP tomorrow a.m.  Hypothyroidism: TSH is elevated. -Compliance issue? -We will check free T4. -Continue levothyroxine  Bilateral moderate pleural effusion: Left greater than right: -Hypoalbuminemia could be a contributing factor -Strict INO's and daily weight.  Lasix was hold at the time of admission as patient appears dry on exam. -Echo shows ejection fraction of 60 to 65% with grade 1 diastolic dysfunction.    Hypertension: Blood pressure elevated upon arrival -Continue home meds hydralazine, losartan -Monitor blood pressure closely  Hyperlipidemia: Continue Crestor  DVT prophylaxis: SCD  code Status: DNR Family Communication: None present at bedside.  Plan of care discussed with patient in length and she verbalized understanding and agreed with it. Disposition Plan: Home versus SNF  Consultants:   GI  Cardiology  Procedures:   None  Antimicrobials:   None  Status is: Inpatient   Dispo: The patient is from: Home              Anticipated d/c is to: Home versus SNF?              Anticipated d/c date is: 09/19/2020              Patient currently not medically stable for the discharge.   Subjective: Patient seen and examined.  Resting comfortably on the bed.  Tells me that she feels better this morning and slept well.  Denies chest pain, shortness of breath, vomiting, epigastric pain, hematemesis or melena.  Objective: Vitals:   09/16/20 1303 09/16/20 1330 09/16/20 1400 09/16/20 1430  BP: 133/64 (!) 146/63 (!) 137/58 134/62  Pulse: 81 81 81 84  Resp: 15 (!) 24 19 (!) 25  Temp:      TempSrc:      SpO2: 97%  99% 99% 98%  Weight:      Height:        Intake/Output Summary (Last 24 hours) at 09/16/2020 1453 Last data filed at 09/15/2020 1836 Gross per 24 hour  Intake 1000 ml  Output --  Net 1000 ml   Filed Weights   09/15/20 1743  Weight: 52.2 kg    Examination:  General exam: Appears calm and  comfortable, on room air, communicating well, appears pale and weak. Respiratory system: Clear to auscultation. Respiratory effort normal. Cardiovascular system: S1 & S2 heard, RRR. No JVD, murmurs, rubs, gallops or clicks. No pedal edema. Gastrointestinal system: Abdomen is nondistended, soft and nontender. No organomegaly or masses felt. Normal bowel sounds heard. Central nervous system: Alert and oriented. No focal neurological deficits. Extremities: Symmetric 5 x 5 power. Skin: No rashes, lesions or ulcers Psychiatry: Judgement and insight appear normal. Mood & affect appropriate.    Data Reviewed: I have personally reviewed following labs and imaging studies  CBC: Recent Labs  Lab 09/15/20 1525 09/16/20 0500  WBC 9.3 9.2  NEUTROABS 7.3 7.3  HGB 10.3* 9.5*  HCT 32.0* 29.1*  MCV 95.5 96.4  PLT 313 130   Basic Metabolic Panel: Recent Labs  Lab 09/15/20 1525 09/15/20 1929 09/16/20 0500  NA 133*  --  135  K 3.3*  --  4.0  CL 99  --  103  CO2 26  --  25  GLUCOSE 111*  --  101*  BUN 15  --  14  CREATININE 0.88  --  0.94  CALCIUM 7.8*  --  7.7*  MG  --  1.7 1.7   GFR: Estimated Creatinine Clearance: 37.4 mL/min (by C-G formula based on SCr of 0.94 mg/dL). Liver Function Tests: Recent Labs  Lab 09/15/20 1525 09/16/20 0500  AST 19 19  ALT 13 13  ALKPHOS 58 47  BILITOT 0.5 0.6  PROT 5.4* 4.7*  ALBUMIN 2.1* 1.8*   Recent Labs  Lab 09/15/20 1525  LIPASE 51   No results for input(s): AMMONIA in the last 168 hours. Coagulation Profile: No results for input(s): INR, PROTIME in the last 168 hours. Cardiac Enzymes: No results for input(s): CKTOTAL, CKMB, CKMBINDEX, TROPONINI in the last 168 hours. BNP (last 3 results) No results for input(s): PROBNP in the last 8760 hours. HbA1C: No results for input(s): HGBA1C in the last 72 hours. CBG: No results for input(s): GLUCAP in the last 168 hours. Lipid Profile: No results for input(s): CHOL, HDL, LDLCALC, TRIG,  CHOLHDL, LDLDIRECT in the last 72 hours. Thyroid Function Tests: Recent Labs    09/16/20 0500  TSH 19.135*   Anemia Panel: Recent Labs    09/16/20 0500  VITAMINB12 985*   Sepsis Labs: No results for input(s): PROCALCITON, LATICACIDVEN in the last 168 hours.  Recent Results (from the past 240 hour(s))  Respiratory Panel by RT PCR (Flu A&B, Covid) - Nasopharyngeal Swab     Status: None   Collection Time: 09/15/20  7:58 PM   Specimen: Nasopharyngeal Swab  Result Value Ref Range Status   SARS Coronavirus 2 by RT PCR NEGATIVE NEGATIVE Final    Comment: (NOTE) SARS-CoV-2 target nucleic acids are NOT DETECTED.  The SARS-CoV-2 RNA is generally detectable in upper respiratoy specimens during the acute phase of infection. The lowest concentration of SARS-CoV-2 viral copies this assay can detect is 131 copies/mL. A negative result does not preclude SARS-Cov-2 infection and should not be used as the sole basis for treatment or other patient  management decisions. A negative result may occur with  improper specimen collection/handling, submission of specimen other than nasopharyngeal swab, presence of viral mutation(s) within the areas targeted by this assay, and inadequate number of viral copies (<131 copies/mL). A negative result must be combined with clinical observations, patient history, and epidemiological information. The expected result is Negative.  Fact Sheet for Patients:  PinkCheek.be  Fact Sheet for Healthcare Providers:  GravelBags.it  This test is no t yet approved or cleared by the [place] FDA and  has been authorized for detection and/or diagnosis of SARS-CoV-2 by FDA under an Emergency Use Authorization (EUA). This EUA will remain  in effect (meaning this test can be used) for the duration of the COVID-19 declaration under Section 564(b)(1) of the Act, 21 U.S.C. section 360bbb-3(b)(1), unless the  authorization is terminated or revoked sooner.     Influenza A by PCR NEGATIVE NEGATIVE Final   Influenza B by PCR NEGATIVE NEGATIVE Final    Comment: (NOTE) The Xpert Xpress SARS-CoV-2/FLU/RSV assay is intended as an aid in  the diagnosis of influenza from Nasopharyngeal swab specimens and  should not be used as a sole basis for treatment. Nasal washings and  aspirates are unacceptable for Xpert Xpress SARS-CoV-2/FLU/RSV  testing.  Fact Sheet for Patients: PinkCheek.be  Fact Sheet for Healthcare Providers: GravelBags.it  This test is not yet approved or cleared by the [place] FDA and  has been authorized for detection and/or diagnosis of SARS-CoV-2 by  FDA under an Emergency Use Authorization (EUA). This EUA will remain  in effect (meaning this test can be used) for the duration of the  Covid-19 declaration under Section 564(b)(1) of the Act, 21  U.S.C. section 360bbb-3(b)(1), unless the authorization is  terminated or revoked. Performed at [facility], [place] [address], [place], [place] [address]       Radiology Studies: DG Chest 2 View  Result Date: 09/15/2020 CLINICAL DATA:  Atrial fibrillation. EXAM: CHEST - 2 VIEW COMPARISON:  Mar 23, 2020 FINDINGS: Cardiomediastinal silhouette is normal. Mediastinal contours appear intact. There is no evidence of focal airspace consolidation, or pneumothorax. Bilateral moderate pleural effusions, left greater than right. Interval vertebroplasty of the lower thoracic spine. Soft tissues are grossly normal. IMPRESSION: Bilateral moderate pleural effusions, left greater than right. Electronically Signed   By: Fidela Salisbury M.D.   On: 09/15/2020 15:53   ECHOCARDIOGRAM COMPLETE  Result Date: 09/16/2020    ECHOCARDIOGRAM REPORT   Patient Name:   CALLIE FACEY Date of Exam: 09/16/2020 Medical Rec #:  [id number]         Height:       64.0 in Accession #:    [id number]         Weight:       115.0 lb Date of Birth:  Dec 31, 1934          BSA:          1.546 m Patient Age:    44 years          BP:           133/64 mmHg Patient Gender: F                 HR:           81 bpm. Exam Location:  Inpatient Procedure: 2D Echo, Cardiac Doppler and Color Doppler Indications:    Abnormal ECG  History:        Patient has prior history of Echocardiogram examinations,  most                 recent 04/25/2019. Risk Factors:Hypertension and Dyslipidemia.                 GERD. H/O TIA.  Sonographer:    Clayton Lefort RDCS (AE) Referring Phys: [id number] ASIA B [place]  1. Left ventricular ejection fraction, by estimation, is 60 to 65%. The left ventricle has normal function. The left ventricle has no regional wall motion abnormalities. Left ventricular diastolic parameters are consistent with Grade I diastolic dysfunction (impaired relaxation).  2. Right ventricular systolic function is normal. The right ventricular size is normal. There is mildly elevated pulmonary artery systolic pressure. The estimated right ventricular systolic pressure is 45.4 mmHg.  3. Left atrial size was mildly dilated.  4. Large pleural effusion in the left lateral region.  5. The mitral valve is degenerative. Mild mitral valve regurgitation. No evidence of mitral stenosis.  6. The aortic valve is normal in structure. Aortic valve regurgitation is trivial. No aortic stenosis is present.  7. The inferior vena cava is normal in size with greater than 50% respiratory variability, suggesting right atrial pressure of 3 mmHg. FINDINGS  Left Ventricle: Left ventricular ejection fraction, by estimation, is 60 to 65%. The left ventricle has normal function. The left ventricle has no regional wall motion abnormalities. The left ventricular internal cavity size was normal in size. There is  no left ventricular hypertrophy. Left ventricular diastolic parameters are consistent with Grade I diastolic dysfunction (impaired relaxation).  Normal left ventricular filling pressure. Right Ventricle: The right ventricular size is normal. No increase in right ventricular wall thickness. Right ventricular systolic function is normal. There is mildly elevated pulmonary artery systolic pressure. The tricuspid regurgitant velocity is 2.85  m/s, and with an assumed right atrial pressure of 3 mmHg, the estimated right ventricular systolic pressure is 09.8 mmHg. Left Atrium: Left atrial size was mildly dilated. Right Atrium: Right atrial size was normal in size. Pericardium: There is no evidence of pericardial effusion. Mitral Valve: The mitral valve is degenerative in appearance. There is moderate thickening of the mitral valve leaflet(s). Mild mitral annular calcification. Mild mitral valve regurgitation. No evidence of mitral valve stenosis. Tricuspid Valve: The tricuspid valve is normal in structure. Tricuspid valve regurgitation is trivial. No evidence of tricuspid stenosis. Aortic Valve: The aortic valve is normal in structure. Aortic valve regurgitation is trivial. No aortic stenosis is present. Aortic valve mean gradient measures 4.0 mmHg. Aortic valve peak gradient measures 6.9 mmHg. Aortic valve area, by VTI measures 2.67 cm. Pulmonic Valve: The pulmonic valve was normal in structure. Pulmonic valve regurgitation is not visualized. No evidence of pulmonic stenosis. Aorta: The aortic root is normal in size and structure. Venous: The inferior vena cava is normal in size with greater than 50% respiratory variability, suggesting right atrial pressure of 3 mmHg. IAS/Shunts: The interatrial septum appears to be lipomatous. No atrial level shunt detected by color flow Doppler. Additional Comments: There is a large pleural effusion in the left lateral region.  LEFT VENTRICLE PLAX 2D LVIDd:         3.90 cm  Diastology LVIDs:         3.00 cm  LV e' medial:    7.62 cm/s LV PW:         1.00 cm  LV E/e' medial:  12.7 LV IVS:        1.00 cm  LV e' lateral:   9.03  cm/s LVOT diam:     1.90 cm  LV E/e' lateral: 10.7 LV SV:         75 LV SV Index:   49 LVOT Area:     2.84 cm  RIGHT VENTRICLE             IVC RV Basal diam:  3.30 cm     IVC diam: 1.80 cm RV S prime:     14.10 cm/s TAPSE (M-mode): 1.8 cm LEFT ATRIUM             Index       RIGHT ATRIUM           Index LA diam:        3.20 cm 2.07 cm/m  RA Area:     15.50 cm LA Vol (A2C):   62.8 ml 40.61 ml/m RA Volume:   35.50 ml  22.96 ml/m LA Vol (A4C):   57.1 ml 36.93 ml/m LA Biplane Vol: 62.9 ml 40.68 ml/m  AORTIC VALVE AV Area (Vmax):    2.51 cm AV Area (Vmean):   2.52 cm AV Area (VTI):     2.67 cm AV Vmax:           131.00 cm/s AV Vmean:          93.300 cm/s AV VTI:            0.281 m AV Peak Grad:      6.9 mmHg AV Mean Grad:      4.0 mmHg LVOT Vmax:         116.00 cm/s LVOT Vmean:        82.900 cm/s LVOT VTI:          0.265 m LVOT/AV VTI ratio: 0.94  AORTA Ao Root diam: 3.30 cm MITRAL VALVE                TRICUSPID VALVE MV Area (PHT): 2.50 cm     TR Peak grad:   32.5 mmHg MV Decel Time: 304 msec     TR Vmax:        285.00 cm/s MV E velocity: 96.70 cm/s MV A velocity: 133.00 cm/s  SHUNTS MV E/A ratio:  0.73         Systemic VTI:  0.26 m                             Systemic Diam: 1.90 cm Fransico Him MD Electronically signed by Fransico Him MD Signature Date/Time: 09/16/2020/2:35:03 PM    Final     Scheduled Meds: . feeding supplement  237 mL Oral BID BM  . gabapentin  300 mg Oral QHS  . hydrALAZINE  50 mg Oral BID  . levothyroxine  100 mcg Oral QAC breakfast  . losartan  100 mg Oral QPM  . metoprolol tartrate  25 mg Oral BID  . multivitamin with minerals  1 tablet Oral Daily  . pantoprazole (PROTONIX) IV  40 mg Intravenous Q12H  . rosuvastatin  5 mg Oral QHS  . thiamine  100 mg Oral Daily   Continuous Infusions:   LOS: 1 day   Time spent: 40 minutes   Gardiner Espana Loann Quill, MD [facility]  If 7PM-7AM, please contact night-coverage [email or web address] 09/16/2020, 2:53 PM

## 2020-09-16 NOTE — Consult Note (Signed)
Referring Provider:  Clint Primary Care Physician:  [name], MD Primary Gastroenterologist:  Dr. Penelope Coop   Reason for Consultation: Anemia, heme positive stool  HPI: Tammy Allison is a 85 y.o. female with past medical history of irritable bowel syndrome, GERD, TIA,  and macular degeneration presented to the hospital with fatigue and weakness. Upon initial evaluation yesterday, she was found to have hemoglobin of 10.3 compared to previous hemoglobin of 12.4 in June 2021.  Normal LFTs.  Normal lipase.  Occult blood was positive.  GI is consulted for further evaluation.  Further work-up revealed, elevated TSH at 19.13, normal vitamin B12 and vitamin D level.  Chest x-ray yesterday showed bilateral moderate pleural effusion.  Patient seen and examined bedside in the emergency room.  She is denying seeing any black stool or bright blood in the stool.  She is complaining of decreased appetite around 40 pound weight loss in the last 1 year.  Complaining of occasional dark stools.  Usually have constipation but complaining of occasional diarrhea in between.  Denies any reflux, trouble swallowing or pain while swallowing.  CT abdomen pelvis with contrast in June 2021 showed diffuse diverticular disease without any acute changes.  Colonoscopy in 2005 was incomplete probably because of severe diverticular disease.  Last virtual colonoscopy in 2012 showed severe diverticular disease in the sigmoid colon as well as scattered diverticulosis in the entire colon.  Past Medical History:  Diagnosis Date  . Arthritis   . GERD (gastroesophageal reflux disease)   . H/O hiatal hernia   . Hyperlipidemia   . Hypertension   . Hypothyroidism   . Irritable bowel syndrome   . Macular degeneration    Exudative - OD    Past Surgical History:  Procedure Laterality Date  . CATARACT EXTRACTION    . EYE SURGERY Bilateral    cataract surgery w/ lens implant  . FRACTURE SURGERY     right hip  . LUMBAR  LAMINECTOMY/DECOMPRESSION MICRODISCECTOMY Left 05/25/2014   Procedure: LUMBAR LAMINECTOMY/DECOMPRESSION MICRODISCECTOMY 1 LEVEL   lumbar three;  Surgeon: Hosie Spangle, MD;  Location: [facility];  Service: Neurosurgery;  Laterality: Left;  . THYROID SURGERY      Prior to Admission medications   Medication Sig Start Date End Date Taking? Authorizing Provider  aspirin EC 81 MG EC tablet Take 1 tablet (81 mg total) by mouth daily. 04/27/19  Yes Vann, Jessica U, DO  gabapentin (NEURONTIN) 300 MG capsule Take 3 capsules (900 mg total) by mouth at bedtime. Patient taking differently: Take 300 mg by mouth at bedtime.  08/14/20  Yes McCue, Janett Billow, NP  hydrALAZINE (APRESOLINE) 50 MG tablet TAKE 1 TABLET BY MOUTH 3  TIMES DAILY Patient taking differently: Take 50 mg by mouth in the morning and at bedtime.  01/11/20  Yes Belva Crome, MD  levothyroxine (SYNTHROID) 100 MCG tablet Take 100 mcg by mouth daily before breakfast. 08/21/20  Yes [provider]  losartan (COZAAR) 100 MG tablet Take 1 tablet (100 mg total) by mouth daily. Patient taking differently: Take 100 mg by mouth every evening.  04/29/19  Yes Geradine Girt, DO  Multiple Vitamin (MULTIVITAMIN WITH MINERALS) TABS tablet Take 1 tablet by mouth daily.   Yes [provider]  Multiple Vitamins-Minerals (PRESERVISION AREDS) TABS Take 1 capsule by mouth 2 (two) times daily.    Yes [provider]  ondansetron (ZOFRAN) 4 MG tablet Take 4 mg by mouth daily as needed for nausea or vomiting.  05/10/20  Yes [provider]  pantoprazole (PROTONIX) 40 MG tablet Take 40 mg by mouth daily as needed (indigestion/acid reflux/stomach pain).    Yes [provider]  rosuvastatin (CRESTOR) 5 MG tablet Take 5 mg by mouth every evening.    Yes [provider]  alendronate (FOSAMAX) 70 MG tablet Take 70 mg by mouth once a week. 09/13/20   [provider]  cetirizine (ZYRTEC) 10 MG tablet Take 5 mg by  mouth at bedtime as needed for allergies.  Patient not taking: Reported on 09/15/2020    [provider]  levothyroxine (SYNTHROID) 88 MCG tablet Take 88 mcg by mouth daily before breakfast.  Patient not taking: Reported on 09/15/2020 07/20/19   [provider]  vitamin B-12 (CYANOCOBALAMIN) 100 MCG tablet Take 100 mcg by mouth daily. Patient not taking: Reported on 09/15/2020    [provider]    Scheduled Meds: . feeding supplement  237 mL Oral BID BM  . gabapentin  900 mg Oral QHS  . hydrALAZINE  50 mg Oral BID  . levothyroxine  100 mcg Oral QAC breakfast  . loratadine  10 mg Oral Daily  . losartan  100 mg Oral QPM  . multivitamin with minerals  1 tablet Oral Daily  . pantoprazole (PROTONIX) IV  40 mg Intravenous Q12H  . rosuvastatin  5 mg Oral QHS  . thiamine  100 mg Oral Daily  . vitamin B-12  100 mcg Oral Daily   Continuous Infusions: PRN Meds:.acetaminophen **OR** acetaminophen, ondansetron **OR** ondansetron (ZOFRAN) IV, oxyCODONE, polyethylene glycol, traZODone  Allergies as of 09/15/2020 - Review Complete 09/15/2020  Allergen Reaction Noted  . Sulfa antibiotics Hives 05/12/2014  . Carvedilol Rash 11/03/2018    Family History  Problem Relation Age of Onset  . Macular degeneration Mother     Social History   Socioeconomic History  . Marital status: Married    Spouse name: Not on file  . Number of children: Not on file  . Years of education: Not on file  . Highest education level: Not on file  Occupational History  . Not on file  Tobacco Use  . Smoking status: Never Smoker  . Smokeless tobacco: Never Used  Vaping Use  . Vaping Use: Never used  Substance and Sexual Activity  . Alcohol use: No  . Drug use: Never  . Sexual activity: Not on file  Other Topics Concern  . Not on file  Social History Narrative  . Not on file   Social Determinants of Health   Financial Resource Strain:   . Difficulty of Paying Living Expenses: Not  on file  Food Insecurity:   . Worried About Charity fundraiser in the Last Year: Not on file  . Ran Out of Food in the Last Year: Not on file  Transportation Needs:   . Lack of Transportation (Medical): Not on file  . Lack of Transportation (Non-Medical): Not on file  Physical Activity:   . Days of Exercise per Week: Not on file  . Minutes of Exercise per Session: Not on file  Stress:   . Feeling of Stress : Not on file  Social Connections:   . Frequency of Communication with Friends and Family: Not on file  . Frequency of Social Gatherings with Friends and Family: Not on file  . Attends Religious Services: Not on file  . Active Member of Clubs or Organizations: Not on file  . Attends Archivist Meetings: Not on file  .  Marital Status: Not on file  Intimate Partner Violence:   . Fear of Current or Ex-Partner: Not on file  . Emotionally Abused: Not on file  . Physically Abused: Not on file  . Sexually Abused: Not on file    Review of Systems: All negative except as stated above in HPI.  Complaining of fatigue, weakness, weight loss, palpitation.  Physical Exam: Vital signs: Vitals:   09/16/20 0900 09/16/20 0915  BP: (!) 149/63 (!) 164/76  Pulse: 96 97  Resp: (!) 21 (!) 24  Temp:    SpO2: 95% 98%     Physical Exam Vitals and nursing note reviewed.  Constitutional:      Appearance: Normal appearance.  HENT:     Head: Normocephalic and atraumatic.     Nose: Nose normal.  Eyes:     General: No scleral icterus.    Extraocular Movements: Extraocular movements intact.  Cardiovascular:     Rate and Rhythm: Normal rate.     Heart sounds: Normal heart sounds. No murmur heard.   Pulmonary:     Effort: Pulmonary effort is normal. No respiratory distress.     Breath sounds: Rales present.  Abdominal:     General: Bowel sounds are normal. There is no distension.     Palpations: Abdomen is soft.     Tenderness: There is no abdominal tenderness. There is no  guarding.  Musculoskeletal:        General: No swelling. Normal range of motion.     Cervical back: Normal range of motion and neck supple.     Right lower leg: No edema.     Left lower leg: No edema.  Skin:    General: Skin is warm.     Coloration: Skin is not jaundiced.  Neurological:     Mental Status: She is alert and oriented to person, place, and time.  Psychiatric:        Thought Content: Thought content normal.        Judgment: Judgment normal.    GI:  Lab Results: Recent Labs    09/15/20 1525 09/16/20 0500  WBC 9.3 9.2  HGB 10.3* 9.5*  HCT 32.0* 29.1*  PLT 313 279   BMET Recent Labs    09/15/20 1525 09/16/20 0500  NA 133* 135  K 3.3* 4.0  CL 99 103  CO2 26 25  GLUCOSE 111* 101*  BUN 15 14  CREATININE 0.88 0.94  CALCIUM 7.8* 7.7*   LFT Recent Labs    09/16/20 0500  PROT 4.7*  ALBUMIN 1.8*  AST 19  ALT 13  ALKPHOS 47  BILITOT 0.6   PT/INR No results for input(s): LABPROT, INR in the last 72 hours.   Studies/Results: DG Chest 2 View  Result Date: 09/15/2020 CLINICAL DATA:  Atrial fibrillation. EXAM: CHEST - 2 VIEW COMPARISON:  Mar 23, 2020 FINDINGS: Cardiomediastinal silhouette is normal. Mediastinal contours appear intact. There is no evidence of focal airspace consolidation, or pneumothorax. Bilateral moderate pleural effusions, left greater than right. Interval vertebroplasty of the lower thoracic spine. Soft tissues are grossly normal. IMPRESSION: Bilateral moderate pleural effusions, left greater than right. Electronically Signed   By: Fidela Salisbury M.D.   On: 09/15/2020 15:53    Impression/Plan: - Anemia with heme positive stool.  Occasional dark stools.  She is complaining of 40 pound weight loss. - History of colonic stricture from severe diverticulosis.  Incomplete colonoscopy in 2005.  She has been getting virtual colonoscopy since then.  Last virtual  colonoscopy was in 2012.  CT abdomen pelvis with contrast in June 2021 showed  severe diverticulosis otherwise no acute changes. -History of TIA -Thyroid disease -History of palpitation.  Followed by cardiology.  She has nonobstructive coronary artery disease.  Currently denies any chest pain or shortness of breath.   Recommendations --------------------------- -Given patient's anemia, weight loss and heme positive stool, I think is reasonable to proceed with EGD and colonoscopy.  May consider doing colonoscopy with ultraslim scope given her severe diverticular disease. -Okay to have clear liquid diet today.  Keep n.p.o. past midnight. -Repeat labs in the morning.  GI will follow  Risks (bleeding, infection, bowel perforation that could require surgery, sedation-related changes in cardiopulmonary systems), benefits (identification and possible treatment of source of symptoms, exclusion of certain causes of symptoms), and alternatives (watchful waiting, radiographic imaging studies, empiric medical treatment)  were explained to patient in detail and patient wishes to proceed.    LOS: 1 day   Otis Brace  MD, FACP 09/16/2020, 9:25 AM  Contact #  [phone]

## 2020-09-16 NOTE — ED Notes (Signed)
Attempted x2 to give report to 4E25.

## 2020-09-16 NOTE — Progress Notes (Signed)
PT Cancellation Note  Patient Details Name: NAEVIA UNTERREINER MRN: [id number] DOB: 27-Nov-1934   Cancelled Treatment:    Reason Eval/Treat Not Completed: Other (comment). Attempted earlier and pt just brought to room from ED and nurse and tech working with pt.    Shary Decamp [facility] 09/16/2020, 4:37 PM [place] Pager [phone] Office [phone]

## 2020-09-16 NOTE — Consult Note (Addendum)
Cardiology Consultation:   Patient ID: BETRICE WANAT MRN: [id number]; DOB: 23-Mar-1935  Admit date: 09/15/2020 Date of Consult: 09/16/2020  Primary Care Provider: [name], MD [facility] Cardiologist: Sinclair Grooms, MD  [place] Electrophysiologist:  None    Patient Profile:   DESHANNON SEIDE is a 85 y.o. female with a hx of HTN, HLD, GERD, palpitations, TIA, atypical chest pain who is being seen today for the evaluation of PAFi at the request of Dr. Doristine Bosworth.  History of Present Illness:   Ms. Tindol with above hx and cardiac CTA done 05/13/19 with calcium score of 192, normal coronary origin with Right dominance, minimal non obstructive CAD, medical management recommended, dilated pulmonary artery measuring 34 mm suggestive of pulmonary htn.  Echo then with EF 60-65%  Aortic root 37 mm, LA mildly dilated.  Now presents to ER 09/15/20 with weakness and found to be in SR with PACs then SVT mild ST depression. She has had fatigue for months, decreased appetite has lost 40 lbs in a year. She did have COVID boost 2 days prior to admit.   Stool Heme+, hx of thyroid disease on synthroid, B12 def. Has had drop in hgb from 12 to 10.   Given fluids in ER 1 L.   EKG:  The EKG was personally reviewed and demonstrates:  09/15/20 SR at 98 with PACs.  Then at  1816 SVT at 154 with lat ST depression.  Follow up with SR and PACs and mild ST depression lat leads.   Telemetry:  Telemetry was personally reviewed and demonstrates:  SR with shourt burst of SVT last one 0935  Na 135, K+ 4.0 CL 103, Cr 0.94, mg+ 1.71, albumin 1.8  BNP 223 Hs troponin 14,15  Vit D 48, B12 985 hgb 9.5, WBC 9.2 plts 279  TSH 19.135 on synthroid Urine + hgb 2V CXR  FINDINGS: Cardiomediastinal silhouette is normal. Mediastinal contours appear intact.  There is no evidence of focal airspace consolidation, or pneumothorax. Bilateral moderate pleural effusions, left greater than  right.  Interval vertebroplasty of the lower thoracic spine. Soft tissues are grossly normal.  IMPRESSION: Bilateral moderate pleural effusions, left greater than right.  Currently BP 164/76 SR feels well no complaints.  Past Medical History:  Diagnosis Date  . Arthritis   . GERD (gastroesophageal reflux disease)   . H/O hiatal hernia   . Hyperlipidemia   . Hypertension   . Hypothyroidism   . Irritable bowel syndrome   . Macular degeneration    Exudative - OD    Past Surgical History:  Procedure Laterality Date  . CATARACT EXTRACTION    . EYE SURGERY Bilateral    cataract surgery w/ lens implant  . FRACTURE SURGERY     right hip  . LUMBAR LAMINECTOMY/DECOMPRESSION MICRODISCECTOMY Left 05/25/2014   Procedure: LUMBAR LAMINECTOMY/DECOMPRESSION MICRODISCECTOMY 1 LEVEL   lumbar three;  Surgeon: Hosie Spangle, MD;  Location: [place] [facility];  Service: Neurosurgery;  Laterality: Left;  . THYROID SURGERY       Home Medications:  Prior to Admission medications   Medication Sig Start Date End Date Taking? Authorizing Provider  aspirin EC 81 MG EC tablet Take 1 tablet (81 mg total) by mouth daily. 04/27/19  Yes Vann, Jessica U, DO  gabapentin (NEURONTIN) 300 MG capsule Take 3 capsules (900 mg total) by mouth at bedtime. Patient taking differently: Take 300 mg by mouth at bedtime.  08/14/20  Yes Frann Rider, NP  hydrALAZINE (APRESOLINE) 50  MG tablet TAKE 1 TABLET BY MOUTH 3  TIMES DAILY Patient taking differently: Take 50 mg by mouth in the morning and at bedtime.  01/11/20  Yes Belva Crome, MD  levothyroxine (SYNTHROID) 100 MCG tablet Take 100 mcg by mouth daily before breakfast. 08/21/20  Yes [provider]  losartan (COZAAR) 100 MG tablet Take 1 tablet (100 mg total) by mouth daily. Patient taking differently: Take 100 mg by mouth every evening.  04/29/19  Yes Geradine Girt, DO  Multiple Vitamin (MULTIVITAMIN WITH MINERALS) TABS tablet Take 1 tablet by mouth daily.    Yes [provider]  Multiple Vitamins-Minerals (PRESERVISION AREDS) TABS Take 1 capsule by mouth 2 (two) times daily.    Yes [provider]  ondansetron (ZOFRAN) 4 MG tablet Take 4 mg by mouth daily as needed for nausea or vomiting.  05/10/20  Yes [provider]  pantoprazole (PROTONIX) 40 MG tablet Take 40 mg by mouth daily as needed (indigestion/acid reflux/stomach pain).    Yes [provider]  rosuvastatin (CRESTOR) 5 MG tablet Take 5 mg by mouth every evening.    Yes [provider]  alendronate (FOSAMAX) 70 MG tablet Take 70 mg by mouth once a week. 09/13/20   [provider]  cetirizine (ZYRTEC) 10 MG tablet Take 5 mg by mouth at bedtime as needed for allergies.  Patient not taking: Reported on 09/15/2020    [provider]  levothyroxine (SYNTHROID) 88 MCG tablet Take 88 mcg by mouth daily before breakfast.  Patient not taking: Reported on 09/15/2020 07/20/19   [provider]  vitamin B-12 (CYANOCOBALAMIN) 100 MCG tablet Take 100 mcg by mouth daily. Patient not taking: Reported on 09/15/2020    [provider]    Inpatient Medications: Scheduled Meds: . feeding supplement  237 mL Oral BID BM  . gabapentin  900 mg Oral QHS  . hydrALAZINE  50 mg Oral BID  . levothyroxine  100 mcg Oral QAC breakfast  . loratadine  10 mg Oral Daily  . losartan  100 mg Oral QPM  . multivitamin with minerals  1 tablet Oral Daily  . pantoprazole (PROTONIX) IV  40 mg Intravenous Q12H  . rosuvastatin  5 mg Oral QHS  . thiamine  100 mg Oral Daily  . vitamin B-12  100 mcg Oral Daily   Continuous Infusions:  PRN Meds: acetaminophen **OR** acetaminophen, ondansetron **OR** ondansetron (ZOFRAN) IV, oxyCODONE, polyethylene glycol, traZODone  Allergies:    Allergies  Allergen Reactions  . Sulfa Antibiotics Hives  . Carvedilol Rash    Social History:   Social History   Socioeconomic History  . Marital status: Married     Spouse name: Not on file  . Number of children: Not on file  . Years of education: Not on file  . Highest education level: Not on file  Occupational History  . Not on file  Tobacco Use  . Smoking status: Never Smoker  . Smokeless tobacco: Never Used  Vaping Use  . Vaping Use: Never used  Substance and Sexual Activity  . Alcohol use: No  . Drug use: Never  . Sexual activity: Not on file  Other Topics Concern  . Not on file  Social History Narrative  . Not on file   Social Determinants of Health   Financial Resource Strain:   . Difficulty of Paying Living Expenses: Not on file  Food Insecurity:   . Worried About Charity fundraiser in the Last Year:  Not on file  . Ran Out of Food in the Last Year: Not on file  Transportation Needs:   . Lack of Transportation (Medical): Not on file  . Lack of Transportation (Non-Medical): Not on file  Physical Activity:   . Days of Exercise per Week: Not on file  . Minutes of Exercise per Session: Not on file  Stress:   . Feeling of Stress : Not on file  Social Connections:   . Frequency of Communication with Friends and Family: Not on file  . Frequency of Social Gatherings with Friends and Family: Not on file  . Attends Religious Services: Not on file  . Active Member of Clubs or Organizations: Not on file  . Attends Archivist Meetings: Not on file  . Marital Status: Not on file  Intimate Partner Violence:   . Fear of Current or Ex-Partner: Not on file  . Emotionally Abused: Not on file  . Physically Abused: Not on file  . Sexually Abused: Not on file    Family History:    Family History  Problem Relation Age of Onset  . Macular degeneration Mother      ROS:  Please see the history of present illness.  General:no colds or fevers, + weight changes Skin:no rashes or ulcers HEENT:no blurred vision, no congestion CV:see HPI PUL:see HPI GI:no diarrhea constipation or melena, no indigestion GU:no hematuria, no  dysuria MS:no joint pain, no claudication Neuro:no syncope, no lightheadedness Endo:no diabetes, + thyroid disease  All other ROS reviewed and negative.     Physical Exam/Data:   Vitals:   09/16/20 0700 09/16/20 0715 09/16/20 0800 09/16/20 0815  BP: 114/81 135/71 (!) 139/56 (!) 146/69  Pulse: 96 96 97 95  Resp: 20 (!) 24 (!) 25 (!) 23  Temp:      TempSrc:      SpO2: 95% 94% 95% 95%  Weight:      Height:        Intake/Output Summary (Last 24 hours) at 09/16/2020 0851 Last data filed at 09/15/2020 1836 Gross per 24 hour  Intake 1000 ml  Output --  Net 1000 ml   Last 3 Weights 09/15/2020 08/14/2020 05/10/2020  Weight (lbs) 115 lb 113 lb 117 lb  Weight (kg) 52.164 kg 51.256 kg 53.071 kg     Body mass index is 19.74 kg/m.  General:  Well nourished, well developed, in no acute distress HEENT: normal Lymph: no adenopathy Neck: no JVD Endocrine:  No thryomegaly Vascular: No carotid bruits; pedal pulses 2+ bilaterally  Cardiac:  normal S1, S2; RRR; no murmur gallup rub or click Lungs:  clear to auscultation bilaterally, no wheezing, rhonchi or rales  Abd: soft, nontender, no hepatomegaly  Ext: no edema Musculoskeletal:  No deformities, BUE and BLE strength normal and equal Skin: warm and dry  Neuro:  Alert and oriented X 3 MAE, follows commands, no focal abnormalities noted Psych:  Normal affect    Relevant CV Studies: Cardiac CTA  05/12/20  Coronary Arteries:  Normal coronary origin.  Right dominance.  RCA is a large dominant artery that gives rise to PDA and PLVB. There is minimal calcified plaque in the ostium with stenosis 0-25%.  Left main is a large artery that gives rise to LAD and LCX arteries. Left main has minimal calcified plaque in the ostium with stenosis 0-25%.  LAD is a large vessel that gives rise to one large diagonal artery, there is minimal calcified plaque in the proximal and mid LAD  with stenosis 0-25%.  LCX is a non-dominant artery that  gives rise to one large OM1 branch. There is no plaque.  Other findings:  Normal pulmonary vein drainage into the left atrium.  Normal let atrial appendage without a thrombus.  IMPRESSION: 1. Coronary calcium score of 192. This was 46 percentile for age and sex matched control.  2. Normal coronary origin with right dominance.  3. Minimal non-obstructive CAD. CAD RADS 1, medical management is recommended.  4. Dilated pulmonary artery measuring 34 mm suggestive of pulmonary hypertension.   Laboratory Data:  High Sensitivity Troponin:   Recent Labs  Lab 09/15/20 1650 09/15/20 1929  TROPONINIHS 14 15     Chemistry Recent Labs  Lab 09/15/20 1525 09/16/20 0500  NA 133* 135  K 3.3* 4.0  CL 99 103  CO2 26 25  GLUCOSE 111* 101*  BUN 15 14  CREATININE 0.88 0.94  CALCIUM 7.8* 7.7*  GFRNONAA >60 >60  ANIONGAP 8 7    Recent Labs  Lab 09/15/20 1525 09/16/20 0500  PROT 5.4* 4.7*  ALBUMIN 2.1* 1.8*  AST 19 19  ALT 13 13  ALKPHOS 58 47  BILITOT 0.5 0.6   Hematology Recent Labs  Lab 09/15/20 1525 09/16/20 0500  WBC 9.3 9.2  RBC 3.35* 3.02*  HGB 10.3* 9.5*  HCT 32.0* 29.1*  MCV 95.5 96.4  MCH 30.7 31.5  MCHC 32.2 32.6  RDW 14.7 15.1  PLT 313 279   BNP Recent Labs  Lab 09/15/20 1650  BNP 223.8*    DDimer No results for input(s): DDIMER in the last 168 hours.   Radiology/Studies:  DG Chest 2 View  Result Date: 09/15/2020 CLINICAL DATA:  Atrial fibrillation. EXAM: CHEST - 2 VIEW COMPARISON:  Mar 23, 2020 FINDINGS: Cardiomediastinal silhouette is normal. Mediastinal contours appear intact. There is no evidence of focal airspace consolidation, or pneumothorax. Bilateral moderate pleural effusions, left greater than right. Interval vertebroplasty of the lower thoracic spine. Soft tissues are grossly normal. IMPRESSION: Bilateral moderate pleural effusions, left greater than right. Electronically Signed   By: Fidela Salisbury M.D.   On: 09/15/2020  15:53     Assessment and Plan:   1. SVT may be related to hypothyroid, hypokalemia on arrival, had covid vaccine 2 days prior and have seen rapid HR post vaccine.  Non obstructive CAD on Cardiac CTA last year. 2. Failure to thrive, may be related to hypothyroid, B12 def is stable. Now with heme + stool and anemia, along with wt loss, IM following. 3. Hypokalemia replaced and stable. 4. Protein calorie malnutrition with low albumin. Per IM. 5. Pleural effusions, CXR this am pending 6. HTN controlled on home meds 7. HLD controlled on home meds. --LDL was 37 in 05/2019                 For questions or updates, please contact [place] Please consult [email or web address] for contact info under    Signed, Cecilie Kicks, NP  09/16/2020 8:51 AM   Personally seen and examined. Agree with above.   85 year old with episode of tachycardia, appears to be a long RP tachycardia likely either sinus tachycardia or paroxysmal atrial tachycardia in the setting of extreme weight loss over the past year.  Heme positive stool.  Telemetry personally reviewed as well as EKGs-EKG appears to be sinus tachycardia.  Once again cannot exclude paroxysmal atrial tachycardia.  Same with short burst of SVT earlier today.  Telemetry was labeled as atrial fibrillation but this is not atrial  fibrillation.  She does currently have sinus rhythm with PACs.  Albumin 1.8. Potassium 3.3 improved to 4.0.  With normal ejection fraction last year.  CT scan showed mild nonobstructive coronary artery disease last year.  Assessment and plan  SVT -Likely sinus tachycardia versus paroxysmal atrial tachycardia. -Metoprolol tartrate 25 mg twice a day seems reasonable. -Repeat echocardiogram pending.  Failure to thrive with anemia and profound weight loss -Concerning for potential underlying colonic malignancy.  GI on board.  Protein calorie malnutrition -With low albumin, this leads to third spacing, pleural  effusions for instance.    Candee Furbish, MD

## 2020-09-17 ENCOUNTER — Encounter (HOSPITAL_COMMUNITY)
Admission: EM | Disposition: A | Discharge status: Home / Self Care | Payer: Self-pay | Source: Home / Self Care | Attending: Internal Medicine

## 2020-09-17 ENCOUNTER — Encounter (HOSPITAL_COMMUNITY): Payer: Self-pay | Admitting: Family Medicine

## 2020-09-17 ENCOUNTER — Inpatient Hospital Stay (HOSPITAL_COMMUNITY): Payer: Medicare Other

## 2020-09-17 ENCOUNTER — Inpatient Hospital Stay (HOSPITAL_COMMUNITY): Payer: Medicare Other | Admitting: Certified Registered Nurse Anesthetist

## 2020-09-17 DIAGNOSIS — I471 Supraventricular tachycardia: Secondary | ICD-10-CM | POA: Diagnosis not present

## 2020-09-17 HISTORY — PX: BIOPSY: SHX5522

## 2020-09-17 HISTORY — PX: COLONOSCOPY WITH PROPOFOL: SHX5780

## 2020-09-17 HISTORY — PX: SUBMUCOSAL TATTOO INJECTION: SHX6856

## 2020-09-17 HISTORY — PX: ESOPHAGOGASTRODUODENOSCOPY (EGD) WITH PROPOFOL: SHX5813

## 2020-09-17 LAB — MAGNESIUM: Magnesium: 1.6 mg/dL — ABNORMAL LOW (ref 1.7–2.4)

## 2020-09-17 LAB — COMPREHENSIVE METABOLIC PANEL
ALT: 12 U/L (ref 0–44)
AST: 19 U/L (ref 15–41)
Albumin: 1.8 g/dL — ABNORMAL LOW (ref 3.5–5.0)
Alkaline Phosphatase: 50 U/L (ref 38–126)
Anion gap: 6 (ref 5–15)
BUN: 13 mg/dL (ref 8–23)
CO2: 24 mmol/L (ref 22–32)
Calcium: 7.4 mg/dL — ABNORMAL LOW (ref 8.9–10.3)
Chloride: 101 mmol/L (ref 98–111)
Creatinine, Ser: 0.83 mg/dL (ref 0.44–1.00)
GFR, Estimated: >60 mL/min (ref >60)
Glucose, Bld: 103 mg/dL — ABNORMAL HIGH (ref 70–99)
Potassium: 3.7 mmol/L (ref 3.5–5.1)
Sodium: 131 mmol/L — ABNORMAL LOW (ref 135–145)
Total Bilirubin: 0.8 mg/dL (ref 0.3–1.2)
Total Protein: 4.7 g/dL — ABNORMAL LOW (ref 6.5–8.1)

## 2020-09-17 LAB — CBC
HCT: 28.9 % — ABNORMAL LOW (ref 36.0–46.0)
Hemoglobin: 9.4 g/dL — ABNORMAL LOW (ref 12.0–15.0)
MCH: 31.1 pg (ref 26.0–34.0)
MCHC: 32.5 g/dL (ref 30.0–36.0)
MCV: 95.7 fL (ref 80.0–100.0)
Platelets: 281 10*3/uL (ref 150–400)
RBC: 3.02 MIL/uL — ABNORMAL LOW (ref 3.87–5.11)
RDW: 14.9 % (ref 11.5–15.5)
WBC: 11.1 10*3/uL — ABNORMAL HIGH (ref 4.0–10.5)
nRBC: 0 % (ref 0.0–0.2)

## 2020-09-17 SURGERY — ESOPHAGOGASTRODUODENOSCOPY (EGD) WITH PROPOFOL
Anesthesia: Monitor Anesthesia Care

## 2020-09-17 MED ORDER — IOHEXOL 9 MG/ML PO SOLN
ORAL | Status: AC
  Administered 2020-09-17: 500 mL
  Filled 2020-09-17: qty 500

## 2020-09-17 MED ORDER — SPOT INK MARKER SYRINGE KIT
PACK | SUBMUCOSAL | Status: AC
  Filled 2020-09-17: qty 5

## 2020-09-17 MED ORDER — IOHEXOL 9 MG/ML PO SOLN
ORAL | Status: AC
  Filled 2020-09-17: qty 500

## 2020-09-17 MED ORDER — SODIUM CHLORIDE 0.9 % IV SOLN
INTRAVENOUS | Status: AC | PRN
  Administered 2020-09-17: 500 mL via INTRAVENOUS

## 2020-09-17 MED ORDER — MAGNESIUM SULFATE 2 GM/50ML IV SOLN
2.0000 g | Freq: Once | INTRAVENOUS | Status: AC
  Administered 2020-09-17: 2 g via INTRAVENOUS
  Filled 2020-09-17: qty 50

## 2020-09-17 MED ORDER — IOHEXOL 300 MG/ML  SOLN
100.0000 mL | Freq: Once | INTRAMUSCULAR | Status: AC | PRN
  Administered 2020-09-17: 100 mL via INTRAVENOUS

## 2020-09-17 MED ORDER — PROPOFOL 10 MG/ML IV BOLUS
INTRAVENOUS | Status: DC | PRN
  Administered 2020-09-17: 20 mg via INTRAVENOUS

## 2020-09-17 MED ORDER — SPOT INK MARKER SYRINGE KIT
PACK | SUBMUCOSAL | Status: DC | PRN
  Administered 2020-09-17: 7 mL via SUBMUCOSAL

## 2020-09-17 MED ORDER — IOHEXOL 9 MG/ML PO SOLN
500.0000 mL | ORAL | Status: AC
  Administered 2020-09-17 (×2): 500 mL via ORAL

## 2020-09-17 MED ORDER — PROPOFOL 500 MG/50ML IV EMUL
INTRAVENOUS | Status: DC | PRN
  Administered 2020-09-17: 50 ug/kg/min via INTRAVENOUS

## 2020-09-17 MED ORDER — PANTOPRAZOLE SODIUM 40 MG PO TBEC
40.0000 mg | DELAYED_RELEASE_TABLET | Freq: Every day | ORAL | Status: DC
  Administered 2020-09-18 – 2020-09-19 (×2): 40 mg via ORAL
  Filled 2020-09-17 (×2): qty 1

## 2020-09-17 MED ORDER — PHENYLEPHRINE 40 MCG/ML (10ML) SYRINGE FOR IV PUSH (FOR BLOOD PRESSURE SUPPORT)
PREFILLED_SYRINGE | INTRAVENOUS | Status: DC | PRN
  Administered 2020-09-17 (×2): 80 ug via INTRAVENOUS

## 2020-09-17 MED ORDER — PROPOFOL 500 MG/50ML IV EMUL: INTRAVENOUS | Status: DC | PRN

## 2020-09-17 SURGICAL SUPPLY — 25 items

## 2020-09-17 NOTE — Transfer of Care (Signed)
Immediate Anesthesia Transfer of Care Note  Patient: Tammy Allison  Procedure(s) Performed: ESOPHAGOGASTRODUODENOSCOPY (EGD) WITH PROPOFOL (N/A ) COLONOSCOPY WITH PROPOFOL (N/A ) BIOPSY SUBMUCOSAL TATTOO INJECTION  Patient Location: Endoscopy Unit  Anesthesia Type:MAC  Level of Consciousness: drowsy and patient cooperative  Airway & Oxygen Therapy: Patient Spontanous Breathing and Patient connected to nasal cannula oxygen  Post-op Assessment: Report given to RN, Post -op Vital signs reviewed and stable and Patient moving all extremities X 4  Post vital signs: Reviewed and stable  Last Vitals:  Vitals Value Taken Time  BP    Temp    Pulse    Resp    SpO2      Last Pain:  Vitals:   09/17/20 0815  TempSrc: Oral  PainSc: 5          Complications: No complications documented.

## 2020-09-17 NOTE — Anesthesia Postprocedure Evaluation (Signed)
Anesthesia Post Note  Patient: [name]  Procedure(s) Performed: ESOPHAGOGASTRODUODENOSCOPY (EGD) WITH PROPOFOL (N/A ) COLONOSCOPY WITH PROPOFOL (N/A ) BIOPSY SUBMUCOSAL TATTOO INJECTION     Patient location during evaluation: PACU Anesthesia Type: MAC Level of consciousness: awake and alert Pain management: pain level controlled Vital Signs Assessment: post-procedure vital signs reviewed and stable Respiratory status: spontaneous breathing and respiratory function stable Cardiovascular status: stable Postop Assessment: no apparent nausea or vomiting Anesthetic complications: no   No complications documented.  Last Vitals:  Vitals:   09/17/20 1030 09/17/20 1119  BP: (!) 171/71 (!) 164/72  Pulse: 85 83  Resp: 16 15  Temp:  36.7 C  SpO2: 97% 96%    Last Pain:  Vitals:   09/17/20 1119  TempSrc: Oral  PainSc:                  Merlinda Frederick

## 2020-09-17 NOTE — Progress Notes (Signed)
Primary Cardiologist:  Marlou Porch  Subjective:  Denies SSCP, palpitations or Dyspnea Going to endo for EGD with Dr Watt Climes   Objective:  Vitals:   09/16/20 1939 09/16/20 2315 09/17/20 0344 09/17/20 0815  BP: (!) 174/94 (!) 153/77 (!) 178/76 (!) 154/72  Pulse: 95 85 87   Resp: 14 (!) 21 18 13   Temp:  98.1 F (36.7 C) 98 F (36.7 C)   TempSrc:  Oral Oral Oral  SpO2: 97% 97% 96% 97%  Weight:   53.9 kg   Height:        Intake/Output from previous day:  Intake/Output Summary (Last 24 hours) at 09/17/2020 0867 Last data filed at 09/16/2020 2339 Gross per 24 hour  Intake 3512.03 ml  Output --  Net 3512.03 ml    Physical Exam: Affect appropriate Frail elderly female  HEENT: normal Neck supple with no adenopathy JVP normal no bruits no thyromegaly Lungs no wheezing decreased BS bases  Heart:  S1/S2 no murmur, no rub, gallop or click PMI normal Abdomen: benighn, BS positve, no tenderness, no AAA no bruit.  No HSM or HJR Distal pulses intact with no bruits No edema Neuro non-focal Skin warm and dry No muscular weakness   Lab Results: Basic Metabolic Panel: Recent Labs    09/16/20 0500 09/17/20 0152  NA 135 131*  K 4.0 3.7  CL 103 101  CO2 25 24  GLUCOSE 101* 103*  BUN 14 13  CREATININE 0.94 0.83  CALCIUM 7.7* 7.4*  MG 1.7 1.6*   Liver Function Tests: Recent Labs    09/16/20 0500 09/17/20 0152  AST 19 19  ALT 13 12  ALKPHOS 47 50  BILITOT 0.6 0.8  PROT 4.7* 4.7*  ALBUMIN 1.8* 1.8*   Recent Labs    09/15/20 1525  LIPASE 51   CBC: Recent Labs    09/15/20 1525 09/15/20 1525 09/16/20 0500 09/17/20 0152  WBC 9.3   < > 9.2 11.1*  NEUTROABS 7.3  --  7.3  --   HGB 10.3*   < > 9.5* 9.4*  HCT 32.0*   < > 29.1* 28.9*  MCV 95.5   < > 96.4 95.7  PLT 313   < > 279 281   < > = values in this interval not displayed.   Thyroid Function Tests: Recent Labs    09/16/20 0500  TSH 19.135*   Anemia Panel: Recent Labs    09/16/20 0500  VITAMINB12  985*  FERRITIN 24  TIBC 199*  IRON 29    Imaging: DG Chest 2 View  Result Date: 09/15/2020 CLINICAL DATA:  Atrial fibrillation. EXAM: CHEST - 2 VIEW COMPARISON:  Mar 23, 2020 FINDINGS: Cardiomediastinal silhouette is normal. Mediastinal contours appear intact. There is no evidence of focal airspace consolidation, or pneumothorax. Bilateral moderate pleural effusions, left greater than right. Interval vertebroplasty of the lower thoracic spine. Soft tissues are grossly normal. IMPRESSION: Bilateral moderate pleural effusions, left greater than right. Electronically Signed   By: Fidela Salisbury M.D.   On: 09/15/2020 15:53   Portable chest 1 View  Result Date: 09/16/2020 CLINICAL DATA:  Pleural effusion. EXAM: PORTABLE CHEST 1 VIEW COMPARISON:  09/15/2020 FINDINGS: Heart is enlarged. Lungs are hyperinflated. LEFT pleural effusion appears slightly smaller. RIGHT pleural effusion appears stable. There is persistent dense opacity at the LEFT lung base which obscures the LEFT hemidiaphragm. There is no pneumothorax. RIGHT lung is clear. IMPRESSION: 1. Slight decrease in LEFT pleural effusion. 2. Persistent dense opacity at the LEFT lung  base. Electronically Signed   By: Nolon Nations M.D.   On: 09/16/2020 17:29   ECHOCARDIOGRAM COMPLETE  Result Date: 09/16/2020    ECHOCARDIOGRAM REPORT   Patient Name:   Tammy Allison Date of Exam: 09/16/2020 Medical Rec #:  [id number]         Height:       64.0 in Accession #:    [id number]        Weight:       115.0 lb Date of Birth:  [birth year]          BSA:          1.546 m Patient Age:    85 years          BP:           133/64 mmHg Patient Gender: F                 HR:           81 bpm. Exam Location:  Inpatient Procedure: 2D Echo, Cardiac Doppler and Color Doppler Indications:    Abnormal ECG  History:        Patient has prior history of Echocardiogram examinations, most                 recent 04/25/2019. Risk Factors:Hypertension and Dyslipidemia.                  GERD. H/O TIA.  Sonographer:    Clayton Lefort RDCS (AE) Referring Phys: [id number] ASIA B [place]  1. Left ventricular ejection fraction, by estimation, is 60 to 65%. The left ventricle has normal function. The left ventricle has no regional wall motion abnormalities. Left ventricular diastolic parameters are consistent with Grade I diastolic dysfunction (impaired relaxation).  2. Right ventricular systolic function is normal. The right ventricular size is normal. There is mildly elevated pulmonary artery systolic pressure. The estimated right ventricular systolic pressure is 67.2 mmHg.  3. Left atrial size was mildly dilated.  4. Large pleural effusion in the left lateral region.  5. The mitral valve is degenerative. Mild mitral valve regurgitation. No evidence of mitral stenosis.  6. The aortic valve is normal in structure. Aortic valve regurgitation is trivial. No aortic stenosis is present.  7. The inferior vena cava is normal in size with greater than 50% respiratory variability, suggesting right atrial pressure of 3 mmHg. FINDINGS  Left Ventricle: Left ventricular ejection fraction, by estimation, is 60 to 65%. The left ventricle has normal function. The left ventricle has no regional wall motion abnormalities. The left ventricular internal cavity size was normal in size. There is  no left ventricular hypertrophy. Left ventricular diastolic parameters are consistent with Grade I diastolic dysfunction (impaired relaxation). Normal left ventricular filling pressure. Right Ventricle: The right ventricular size is normal. No increase in right ventricular wall thickness. Right ventricular systolic function is normal. There is mildly elevated pulmonary artery systolic pressure. The tricuspid regurgitant velocity is 2.85  m/s, and with an assumed right atrial pressure of 3 mmHg, the estimated right ventricular systolic pressure is 09.4 mmHg. Left Atrium: Left atrial size was mildly dilated. Right  Atrium: Right atrial size was normal in size. Pericardium: There is no evidence of pericardial effusion. Mitral Valve: The mitral valve is degenerative in appearance. There is moderate thickening of the mitral valve leaflet(s). Mild mitral annular calcification. Mild mitral valve regurgitation. No evidence of mitral valve stenosis. Tricuspid Valve: The tricuspid valve is normal in structure. Tricuspid  valve regurgitation is trivial. No evidence of tricuspid stenosis. Aortic Valve: The aortic valve is normal in structure. Aortic valve regurgitation is trivial. No aortic stenosis is present. Aortic valve mean gradient measures 4.0 mmHg. Aortic valve peak gradient measures 6.9 mmHg. Aortic valve area, by VTI measures 2.67 cm. Pulmonic Valve: The pulmonic valve was normal in structure. Pulmonic valve regurgitation is not visualized. No evidence of pulmonic stenosis. Aorta: The aortic root is normal in size and structure. Venous: The inferior vena cava is normal in size with greater than 50% respiratory variability, suggesting right atrial pressure of 3 mmHg. IAS/Shunts: The interatrial septum appears to be lipomatous. No atrial level shunt detected by color flow Doppler. Additional Comments: There is a large pleural effusion in the left lateral region.  LEFT VENTRICLE PLAX 2D LVIDd:         3.90 cm  Diastology LVIDs:         3.00 cm  LV e' medial:    7.62 cm/s LV PW:         1.00 cm  LV E/e' medial:  12.7 LV IVS:        1.00 cm  LV e' lateral:   9.03 cm/s LVOT diam:     1.90 cm  LV E/e' lateral: 10.7 LV SV:         75 LV SV Index:   49 LVOT Area:     2.84 cm  RIGHT VENTRICLE             IVC RV Basal diam:  3.30 cm     IVC diam: 1.80 cm RV S prime:     14.10 cm/s TAPSE (M-mode): 1.8 cm LEFT ATRIUM             Index       RIGHT ATRIUM           Index LA diam:        3.20 cm 2.07 cm/m  RA Area:     15.50 cm LA Vol (A2C):   62.8 ml 40.61 ml/m RA Volume:   35.50 ml  22.96 ml/m LA Vol (A4C):   57.1 ml 36.93 ml/m LA  Biplane Vol: 62.9 ml 40.68 ml/m  AORTIC VALVE AV Area (Vmax):    2.51 cm AV Area (Vmean):   2.52 cm AV Area (VTI):     2.67 cm AV Vmax:           131.00 cm/s AV Vmean:          93.300 cm/s AV VTI:            0.281 m AV Peak Grad:      6.9 mmHg AV Mean Grad:      4.0 mmHg LVOT Vmax:         116.00 cm/s LVOT Vmean:        82.900 cm/s LVOT VTI:          0.265 m LVOT/AV VTI ratio: 0.94  AORTA Ao Root diam: 3.30 cm MITRAL VALVE                TRICUSPID VALVE MV Area (PHT): 2.50 cm     TR Peak grad:   32.5 mmHg MV Decel Time: 304 msec     TR Vmax:        285.00 cm/s MV E velocity: 96.70 cm/s MV A velocity: 133.00 cm/s  SHUNTS MV E/A ratio:  0.73         Systemic VTI:  0.26 m  Systemic Diam: 1.90 cm Fransico Him MD Electronically signed by Fransico Him MD Signature Date/Time: 09/16/2020/2:35:03 PM    Final     Cardiac Studies:  ECG: SR PAC;s no acute ST changes    Telemetry: off tele currently   Echo: 09/16/20 EF 60-65%  Mild MR mild LAE   Medications:   . [MAR Hold] feeding supplement  237 mL Oral BID BM  . [MAR Hold] gabapentin  300 mg Oral QHS  . [MAR Hold] hydrALAZINE  50 mg Oral BID  . [MAR Hold] levothyroxine  100 mcg Oral QAC breakfast  . [MAR Hold] losartan  100 mg Oral QPM  . [MAR Hold] metoprolol tartrate  25 mg Oral BID  . [MAR Hold] multivitamin with minerals  1 tablet Oral Daily  . [MAR Hold] pantoprazole (PROTONIX) IV  40 mg Intravenous Q12H  . [MAR Hold] rosuvastatin  5 mg Oral QHS  . [MAR Hold] thiamine  100 mg Oral Daily     . sodium chloride 20 mL/hr at 09/16/20 2101  . magnesium sulfate bolus IVPB 2 g (09/17/20 0748)    Assessment/Plan:   1. Arrhythmia:  Not PAF more SVT ? Related to COVID vaccine continue low dose lopressor  2. Thyroid:  ? Needs increased synthroid dose with TSH  19 but free T4 normal .085 ? Check free T3 3. HTN:  Well controlled.  Continue current medications and low sodium Dash type diet.   4. Failure to thrive:  With  weight loss and anemia Hct 29.1 having EGD today CT abdomen pending but was [facility] other than diverticular disease 05/10/20   Jenkins Rouge 09/17/2020, 8:17 AM

## 2020-09-17 NOTE — Progress Notes (Signed)
PROGRESS NOTE    Tammy Allison  YNW:[id number] DOB: 1935-10-30 DOA: 09/15/2020 PCP: [name], MD   Brief Narrative:  Patient is 85 year old female with past medical history of macular degeneration, IBS, hypothyroidism, hypertension, hyperlipidemia, GERD presents to emergency department with generalized weakness, fatigue and malaise.  Denies hematemesis, melena, over-the-counter use of NSAIDs and not on any anticoagulation.  In ED: Patient was afebrile, tachycardic, tachypneic, blood pressure: 201/90, maintaining oxygen saturation on room air, no leukocytosis, hypokalemia with potassium 3.3.  Kidney function within normal limit.  Albumin: 2.1.  BNP: 223.8.  Initial troponin XIV.  UA was borderline showed hematuria.  Fecal occult blood positive.  Chest x-ray showed bilateral moderate pleural effusion left greater than right.  Initial EKG showed SVT with heart rate in 150s.  Repeat showed sinus tach with heart rate of 104.  Patient was given IV fluids in ED.  GI and cardiology was consulted.  Patient admitted her for further evaluation and management of symptomatic anemia and SVT. and management of symptomatic anemia and SVT.  Assessment & Plan:  Symptomatic anemia: -Patient presented with generalized weakness, fatigue, malaise, weight loss and failure to thrive.  Concern for GI malignancy. -H&H trended down from 12.4/36.8 to 9.5/29.1 in 4 months. -POC occult blood is positive. -GI consulted-appreciate help-patient underwent upper GI endoscopy which came back within normal limit and colonoscopy shows multiple polyps which were biopsied.  Refer to colorectal surgeon pending pathology and CT findings. -CT abdomen/pelvis with contrast is ordered and is pending. -Clear liquid diet today. -Iron studies: Iron, saturation: WNL, TIBC: Low, B12: Elevated -Monitor H&H and vitals closely.  Arrhythmias: -SVT? -Reviewed EKG and echo.  Initial troponin negative.  Patient denies ACS symptoms. -Appreciate cardiology input. -Continue metoprolol  25 mg twice daily.  Monitor on telemetry.  Protein calorie malnutrition: -Low albumin-2.1.  Protein shakes between meals. -We will consult dietitian  Hypokalemia: Resolved  Hypomagnesemia: Replenished.  Repeat magnesium level tomorrow AM.  Hypothyroidism: TSH is elevated-19.135 -Compliance issue? -Free T4: WNL, will check T3. -Continue levothyroxine  Bilateral moderate pleural effusion: Left greater than right: -Hypoalbuminemia could be a contributing factor -Strict INO's and daily weight.  Lasix was hold at the time of admission as patient appears dry on exam. -Echo shows ejection fraction of 60 to 65% with grade 1 diastolic dysfunction.   -Patient is maintaining oxygen saturation on room air. -Repeat chest x-ray shows slight decrease in left pleural effusion.  Persistent dense opacity at the left lung base.  Hypertension: Stable this morning -Continue home meds hydralazine, losartan -Monitor blood pressure closely  Hyperlipidemia: Continue Crestor  DVT prophylaxis: SCD  code Status: DNR Family Communication: None present at bedside.  Plan of care discussed with patient in length and she verbalized understanding and agreed with it.  Disposition Plan: Home versus SNF  Consultants:   GI  Cardiology  Procedures:   Echo  EGD  Colonoscopy  CT abdomen/pelvis  Antimicrobials:   None  Status is: Inpatient   Dispo: The patient is from: Home              Anticipated d/c is to: Home versus SNF?              Anticipated d/c date is: 09/19/2020              Patient currently not medically stable for the discharge.   Subjective: Patient seen and examined.  Returned back from the procedure.  Tells me that she is not feeling well overall.  Asking me when she can go home.  Denies abdominal pain, melena or hematemesis.  Objective: Vitals:   09/17/20 1030 09/17/20 1119 09/17/20 1200 09/17/20 1230  BP: (!) 171/71 (!) 164/72 (!) 130/93 (!) 158/73  Pulse: 85 83 80 85   Resp: 16 15 (!) 22 17  Temp:  98 F (36.7 C)    TempSrc:  Oral    SpO2: 97% 96% 90% 97%  Weight:      Height:        Intake/Output Summary (Last 24 hours) at 09/17/2020 1458 Last data filed at 09/17/2020 0954 Gross per 24 hour  Intake 3877.82 ml  Output --  Net 3877.82 ml   Filed Weights   09/15/20 1743 09/17/20 0344  Weight: 52.2 kg 53.9 kg    Examination:  General exam: Appears calm and comfortable, on room air, appears weak and lethargic. Respiratory system: Clear to auscultation. Respiratory effort normal. Cardiovascular system: S1 & S2 heard, RRR. No JVD, murmurs, rubs, gallops or clicks. No pedal edema. Gastrointestinal system: Abdomen is nondistended, soft and nontender. No organomegaly or masses felt. Normal bowel sounds heard. Central nervous system: Alert and oriented. No focal neurological deficits. Extremities: Symmetric 5 x 5 power. Skin: No rashes, lesions or ulcers. Psychiatry: Judgement and insight appear normal. Mood & affect appropriate.   Data Reviewed: I have personally reviewed following labs and imaging studies  CBC: Recent Labs  Lab 09/15/20 1525 09/16/20 0500 09/17/20 0152  WBC 9.3 9.2 11.1*  NEUTROABS 7.3 7.3  --   HGB 10.3* 9.5* 9.4*  HCT 32.0* 29.1* 28.9*  MCV 95.5 96.4 95.7  PLT 313 279 503   Basic Metabolic Panel: Recent Labs  Lab 09/15/20 1525 09/15/20 1929 09/16/20 0500 09/17/20 0152  NA 133*  --  135 131*  K 3.3*  --  4.0 3.7  CL 99  --  103 101  CO2 26  --  25 24  GLUCOSE 111*  --  101* 103*  BUN 15  --  14 13  CREATININE 0.88  --  0.94 0.83  CALCIUM 7.8*  --  7.7* 7.4*  MG  --  1.7 1.7 1.6*   GFR: Estimated Creatinine Clearance: 43.7 mL/min (by C-G formula based on SCr of 0.83 mg/dL). Liver Function Tests: Recent Labs  Lab 09/15/20 1525 09/16/20 0500 09/17/20 0152  AST 19 19 19   ALT 13 13 12   ALKPHOS 58 47 50  BILITOT 0.5 0.6 0.8  PROT 5.4* 4.7* 4.7*  ALBUMIN 2.1* 1.8* 1.8*   Recent Labs  Lab  09/15/20 1525  LIPASE 51   No results for input(s): AMMONIA in the last 168 hours. Coagulation Profile: No results for input(s): INR, PROTIME in the last 168 hours. Cardiac Enzymes: No results for input(s): CKTOTAL, CKMB, CKMBINDEX, TROPONINI in the last 168 hours. BNP (last 3 results) No results for input(s): PROBNP in the last 8760 hours. HbA1C: No results for input(s): HGBA1C in the last 72 hours. CBG: No results for input(s): GLUCAP in the last 168 hours. Lipid Profile: No results for input(s): CHOL, HDL, LDLCALC, TRIG, CHOLHDL, LDLDIRECT in the last 72 hours. Thyroid Function Tests: Recent Labs    09/16/20 0500  TSH 19.135*  FREET4 0.85   Anemia Panel: Recent Labs    09/16/20 0500  VITAMINB12 985*  FERRITIN 24  TIBC 199*  IRON 29   Sepsis Labs: No results for input(s): PROCALCITON, LATICACIDVEN in the last 168 hours.  Recent Results (from the past 240 hour(s))  Urine culture     Status: Abnormal (Preliminary result)  Collection Time: 09/15/20  4:52 PM   Specimen: Urine, Random  Result Value Ref Range Status   Specimen Description URINE, RANDOM  Final   Special Requests NONE  Final   Culture (A)  Final    >=100,000 COLONIES/mL ESCHERICHIA COLI SUSCEPTIBILITIES TO FOLLOW Performed at [facility], [address], [place], [place] [address]    Report Status PENDING  Incomplete  Respiratory Panel by RT PCR (Flu A&B, Covid) - Nasopharyngeal Swab     Status: None   Collection Time: 09/15/20  7:58 PM   Specimen: Nasopharyngeal Swab  Result Value Ref Range Status   SARS Coronavirus 2 by RT PCR NEGATIVE NEGATIVE Final    Comment: (NOTE) SARS-CoV-2 target nucleic acids are NOT DETECTED.  The SARS-CoV-2 RNA is generally detectable in upper respiratoy specimens during the acute phase of infection. The lowest concentration of SARS-CoV-2 viral copies this assay can detect is 131 copies/mL. A negative result does not preclude SARS-Cov-2 infection and should  not be used as the sole basis for treatment or other patient management decisions. A negative result may occur with  improper specimen collection/handling, submission of specimen other than nasopharyngeal swab, presence of viral mutation(s) within the areas targeted by this assay, and inadequate number of viral copies (<131 copies/mL). A negative result must be combined with clinical observations, patient history, and epidemiological information. The expected result is Negative.  Fact Sheet for Patients:  PinkCheek.be  Fact Sheet for Healthcare Providers:  GravelBags.it  This test is no t yet approved or cleared by the [place] FDA and  has been authorized for detection and/or diagnosis of SARS-CoV-2 by FDA under an Emergency Use Authorization (EUA). This EUA will remain  in effect (meaning this test can be used) for the duration of the COVID-19 declaration under Section 564(b)(1) of the Act, 21 U.S.C. section 360bbb-3(b)(1), unless the authorization is terminated or revoked sooner.     Influenza A by PCR NEGATIVE NEGATIVE Final   Influenza B by PCR NEGATIVE NEGATIVE Final    Comment: (NOTE) The Xpert Xpress SARS-CoV-2/FLU/RSV assay is intended as an aid in  the diagnosis of influenza from Nasopharyngeal swab specimens and  should not be used as a sole basis for treatment. Nasal washings and  aspirates are unacceptable for Xpert Xpress SARS-CoV-2/FLU/RSV  testing.  Fact Sheet for Patients: PinkCheek.be  Fact Sheet for Healthcare Providers: GravelBags.it  This test is not yet approved or cleared by the [place] FDA and  has been authorized for detection and/or diagnosis of SARS-CoV-2 by  FDA under an Emergency Use Authorization (EUA). This EUA will remain  in effect (meaning this test can be used) for the duration of the  Covid-19 declaration under  Section 564(b)(1) of the Act, 21  U.S.C. section 360bbb-3(b)(1), unless the authorization is  terminated or revoked. Performed at [facility], [place] [address] [place], [place] [address]       Radiology Studies: DG Chest 2 View  Result Date: 09/15/2020 CLINICAL DATA:  Atrial fibrillation. EXAM: CHEST - 2 VIEW COMPARISON:  Mar 23, 2020 FINDINGS: Cardiomediastinal silhouette is normal. Mediastinal contours appear intact. There is no evidence of focal airspace consolidation, or pneumothorax. Bilateral moderate pleural effusions, left greater than right. Interval vertebroplasty of the lower thoracic spine. Soft tissues are grossly normal. IMPRESSION: Bilateral moderate pleural effusions, left greater than right. Electronically Signed   By: Fidela Salisbury M.D.   On: 09/15/2020 15:53   Portable chest 1 View  Result Date:  09/16/2020 CLINICAL DATA:  Pleural effusion. EXAM: PORTABLE CHEST 1 VIEW COMPARISON:  09/15/2020 FINDINGS: Heart is enlarged. Lungs are hyperinflated. LEFT pleural effusion appears slightly smaller. RIGHT pleural effusion appears stable. There is persistent dense opacity at the LEFT lung base which obscures the LEFT hemidiaphragm. There is no pneumothorax. RIGHT lung is clear. IMPRESSION: 1. Slight decrease in LEFT pleural effusion. 2. Persistent dense opacity at the LEFT lung base. Electronically Signed   By: Nolon Nations M.D.   On: 09/16/2020 17:29   ECHOCARDIOGRAM COMPLETE  Result Date: 09/16/2020    ECHOCARDIOGRAM REPORT   Patient Name:   KESHA HURRELL Date of Exam: 09/16/2020 Medical Rec #:  [id number]         Height:       64.0 in Accession #:    [id number]        Weight:       115.0 lb Date of Birth:  06-20-1935          BSA:          1.546 m Patient Age:    62 years          BP:           133/64 mmHg Patient Gender: F                 HR:           81 bpm. Exam Location:  Inpatient Procedure: 2D Echo, Cardiac Doppler and Color Doppler Indications:    Abnormal  ECG  History:        Patient has prior history of Echocardiogram examinations, most                 recent 04/25/2019. Risk Factors:Hypertension and Dyslipidemia.                 GERD. H/O TIA.  Sonographer:    Clayton Lefort RDCS (AE) Referring Phys: [id number] ASIA B [place]  1. Left ventricular ejection fraction, by estimation, is 60 to 65%. The left ventricle has normal function. The left ventricle has no regional wall motion abnormalities. Left ventricular diastolic parameters are consistent with Grade I diastolic dysfunction (impaired relaxation).  2. Right ventricular systolic function is normal. The right ventricular size is normal. There is mildly elevated pulmonary artery systolic pressure. The estimated right ventricular systolic pressure is 11.5 mmHg.  3. Left atrial size was mildly dilated.  4. Large pleural effusion in the left lateral region.  5. The mitral valve is degenerative. Mild mitral valve regurgitation. No evidence of mitral stenosis.  6. The aortic valve is normal in structure. Aortic valve regurgitation is trivial. No aortic stenosis is present.  7. The inferior vena cava is normal in size with greater than 50% respiratory variability, suggesting right atrial pressure of 3 mmHg. FINDINGS  Left Ventricle: Left ventricular ejection fraction, by estimation, is 60 to 65%. The left ventricle has normal function. The left ventricle has no regional wall motion abnormalities. The left ventricular internal cavity size was normal in size. There is  no left ventricular hypertrophy. Left ventricular diastolic parameters are consistent with Grade I diastolic dysfunction (impaired relaxation). Normal left ventricular filling pressure. Right Ventricle: The right ventricular size is normal. No increase in right ventricular wall thickness. Right ventricular systolic function is normal. There is mildly elevated pulmonary artery systolic pressure. The tricuspid regurgitant velocity is 2.85  m/s, and  with an assumed right atrial pressure of 3 mmHg, the estimated right ventricular systolic  pressure is 35.5 mmHg. Left Atrium: Left atrial size was mildly dilated. Right Atrium: Right atrial size was normal in size. Pericardium: There is no evidence of pericardial effusion. Mitral Valve: The mitral valve is degenerative in appearance. There is moderate thickening of the mitral valve leaflet(s). Mild mitral annular calcification. Mild mitral valve regurgitation. No evidence of mitral valve stenosis. Tricuspid Valve: The tricuspid valve is normal in structure. Tricuspid valve regurgitation is trivial. No evidence of tricuspid stenosis. Aortic Valve: The aortic valve is normal in structure. Aortic valve regurgitation is trivial. No aortic stenosis is present. Aortic valve mean gradient measures 4.0 mmHg. Aortic valve peak gradient measures 6.9 mmHg. Aortic valve area, by VTI measures 2.67 cm. Pulmonic Valve: The pulmonic valve was normal in structure. Pulmonic valve regurgitation is not visualized. No evidence of pulmonic stenosis. Aorta: The aortic root is normal in size and structure. Venous: The inferior vena cava is normal in size with greater than 50% respiratory variability, suggesting right atrial pressure of 3 mmHg. IAS/Shunts: The interatrial septum appears to be lipomatous. No atrial level shunt detected by color flow Doppler. Additional Comments: There is a large pleural effusion in the left lateral region.  LEFT VENTRICLE PLAX 2D LVIDd:         3.90 cm  Diastology LVIDs:         3.00 cm  LV e' medial:    7.62 cm/s LV PW:         1.00 cm  LV E/e' medial:  12.7 LV IVS:        1.00 cm  LV e' lateral:   9.03 cm/s LVOT diam:     1.90 cm  LV E/e' lateral: 10.7 LV SV:         75 LV SV Index:   49 LVOT Area:     2.84 cm  RIGHT VENTRICLE             IVC RV Basal diam:  3.30 cm     IVC diam: 1.80 cm RV S prime:     14.10 cm/s TAPSE (M-mode): 1.8 cm LEFT ATRIUM             Index       RIGHT ATRIUM           Index LA  diam:        3.20 cm 2.07 cm/m  RA Area:     15.50 cm LA Vol (A2C):   62.8 ml 40.61 ml/m RA Volume:   35.50 ml  22.96 ml/m LA Vol (A4C):   57.1 ml 36.93 ml/m LA Biplane Vol: 62.9 ml 40.68 ml/m  AORTIC VALVE AV Area (Vmax):    2.51 cm AV Area (Vmean):   2.52 cm AV Area (VTI):     2.67 cm AV Vmax:           131.00 cm/s AV Vmean:          93.300 cm/s AV VTI:            0.281 m AV Peak Grad:      6.9 mmHg AV Mean Grad:      4.0 mmHg LVOT Vmax:         116.00 cm/s LVOT Vmean:        82.900 cm/s LVOT VTI:          0.265 m LVOT/AV VTI ratio: 0.94  AORTA Ao Root diam: 3.30 cm MITRAL VALVE  TRICUSPID VALVE MV Area (PHT): 2.50 cm     TR Peak grad:   32.5 mmHg MV Decel Time: 304 msec     TR Vmax:        285.00 cm/s MV E velocity: 96.70 cm/s MV A velocity: 133.00 cm/s  SHUNTS MV E/A ratio:  0.73         Systemic VTI:  0.26 m                             Systemic Diam: 1.90 cm Fransico Him MD Electronically signed by Fransico Him MD Signature Date/Time: 09/16/2020/2:35:03 PM    Final     Scheduled Meds: . feeding supplement  237 mL Oral BID BM  . gabapentin  300 mg Oral QHS  . hydrALAZINE  50 mg Oral BID  . levothyroxine  100 mcg Oral QAC breakfast  . losartan  100 mg Oral QPM  . metoprolol tartrate  25 mg Oral BID  . multivitamin with minerals  1 tablet Oral Daily  . pantoprazole  40 mg Oral Daily  . rosuvastatin  5 mg Oral QHS  . thiamine  100 mg Oral Daily   Continuous Infusions:   LOS: 2 days   Time spent: 40 minutes   Sherwood Castilla Loann Quill, MD [facility]  If 7PM-7AM, please contact night-coverage [email or web address] 09/17/2020, 2:58 PM

## 2020-09-17 NOTE — Op Note (Signed)
[facility] Patient Name: Tammy Allison Procedure Date : 09/17/2020 MRN: [id number] Attending MD: Clarene Essex , MD Date of Birth: 1934-12-02 CSN: [id number] Age: 85 Admit Type: Inpatient Procedure:                Upper GI endoscopy Indications:              Generalized abdominal pain, Unexplained iron                            deficiency anemia, Heme positive stool, Weight loss Providers:                Clarene Essex, MD, Baird Cancer, RN, Elspeth Cho                            Tech., Technician, Lesia Sago, Technician Referring MD:              Medicines:                Propofol total dose 45 mg IV Complications:            No immediate complications. Estimated Blood Loss:     Estimated blood loss: none. Procedure:                Pre-Anesthesia Assessment:                           - Prior to the procedure, a History and Physical                            was performed, and patient medications and                            allergies were reviewed. The patient's tolerance of                            previous anesthesia was also reviewed. The risks                            and benefits of the procedure and the sedation                            options and risks were discussed with the patient.                            All questions were answered, and informed consent                            was obtained. Prior Anticoagulants: The patient has                            taken no previous anticoagulant or antiplatelet                            agents except for aspirin. ASA Grade Assessment:  III - A patient with severe systemic disease. After                            reviewing the risks and benefits, the patient was                            deemed in satisfactory condition to undergo the                            procedure.                           After obtaining informed consent, the endoscope was                             passed under direct vision. Throughout the                            procedure, the patient's blood pressure, pulse, and                            oxygen saturations were monitored continuously. The                            GIF-H190 ([id number] Olympus gastroscope was                            introduced through the mouth, and advanced to the                            third part of duodenum. The patient tolerated the                            procedure well. The upper GI endoscopy was                            accomplished without difficulty. Scope In: Scope Out: Findings:      The larynx was normal.      A small hiatal hernia was present.      The entire examined stomach was normal.      The duodenal bulb, first portion of the duodenum, second portion of the       duodenum and third portion of the duodenum were normal.      The exam was otherwise without abnormality. Impression:               - Normal larynx.                           - Small hiatal hernia.                           - Normal stomach.                           - Normal duodenal bulb, first portion of the  duodenum, second portion of the duodenum and third                            portion of the duodenum.                           - The examination was otherwise normal.                           - No specimens collected. Recommendation:           - Clear liquid diet today.                           - Continue present medications.                           - Return to GI clinic PRN.                           - Telephone GI clinic if symptomatic PRN.                           - Perform a colonoscopy today. Procedure Code(s):        --- Professional ---                           [phone], Esophagogastroduodenoscopy, flexible,                            transoral; diagnostic, including collection of                            specimen(s) by brushing or washing, when performed                             (separate procedure) Diagnosis Code(s):        --- Professional ---                           K44.9, Diaphragmatic hernia without obstruction or                            gangrene                           R10.84, Generalized abdominal pain                           D50.9, Iron deficiency anemia, unspecified                           R19.5, Other fecal abnormalities                           R63.4, Abnormal weight loss CPT copyright 2019 [employer] Association. All rights reserved. The codes documented in this report are preliminary and upon coder review may  be revised to meet  current compliance requirements. Clarene Essex, MD 09/17/2020 10:16:42 AM This report has been signed electronically. Number of Addenda: 0

## 2020-09-17 NOTE — Anesthesia Preprocedure Evaluation (Addendum)
Anesthesia Evaluation  Patient identified by MRN, date of birth, ID band Patient awake    Reviewed: Allergy & Precautions, NPO status , Patient's Chart, lab work & pertinent test results  Airway Mallampati: II  TM Distance: >3 FB Neck ROM: Full    Dental no notable dental hx.    Pulmonary neg pulmonary ROS,    Pulmonary exam normal breath sounds clear to auscultation       Cardiovascular Exercise Tolerance: Good hypertension, Normal cardiovascular exam Rhythm:Regular Rate:Normal     Neuro/Psych  Neuromuscular disease negative psych ROS   GI/Hepatic Neg liver ROS, hiatal hernia, GERD  ,  Endo/Other  Hypothyroidism   Renal/GU negative Renal ROS  negative genitourinary   Musculoskeletal  (+) Arthritis ,   Abdominal Normal abdominal exam  (+)   Peds negative pediatric ROS (+)  Hematology  (+) anemia ,   Anesthesia Other Findings   Reproductive/Obstetrics negative OB ROS                             Anesthesia Physical Anesthesia Plan  ASA: III  Anesthesia Plan: MAC   Post-op Pain Management:    Induction:   PONV Risk Score and Plan: 2 and Propofol infusion, TIVA and Treatment may vary due to age or medical condition  Airway Management Planned: Natural Airway and Simple Face Mask  Additional Equipment:   Intra-op Plan:   Post-operative Plan:   Informed Consent: I have reviewed the patients History and Physical, chart, labs and discussed the procedure including the risks, benefits and alternatives for the proposed anesthesia with the patient or authorized representative who has indicated his/her understanding and acceptance.   Patient has DNR.  Discussed DNR with patient and Suspend DNR.     Plan Discussed with: CRNA and Anesthesiologist  Anesthesia Plan Comments:        Anesthesia Quick Evaluation

## 2020-09-17 NOTE — Op Note (Addendum)
[facility] Patient Name: Tammy Allison Procedure Date : 09/17/2020 MRN: [id number] Attending MD: Clarene Essex , MD Date of Birth: 08-11-1935 CSN: [id number] Age: 85 Admit Type: Inpatient Procedure:                Colonoscopy Indications:              Last colonoscopy: 2005, Generalized abdominal pain,                            Heme positive stool, Unexplained iron deficiency                            anemia, Weight loss Providers:                Clarene Essex, MD, Baird Cancer, RN, Elspeth Cho                            Tech., Technician, Lesia Sago, Technician Referring MD:              Medicines:                Propofol total dose 553 mg IV Complications:            No immediate complications. Estimated Blood Loss:     Estimated blood loss: none. Procedure:                Pre-Anesthesia Assessment:                           - Prior to the procedure, a History and Physical                            was performed, and patient medications and                            allergies were reviewed. The patient's tolerance of                            previous anesthesia was also reviewed. The risks                            and benefits of the procedure and the sedation                            options and risks were discussed with the patient.                            All questions were answered, and informed consent                            was obtained. Prior Anticoagulants: The patient has                            taken no previous anticoagulant or antiplatelet  agents except for aspirin. ASA Grade Assessment:                            III - A patient with severe systemic disease. After                            reviewing the risks and benefits, the patient was                            deemed in satisfactory condition to undergo the                            procedure.                           After obtaining informed  consent, the colonoscope                            was passed under direct vision. Throughout the                            procedure, the patient's blood pressure, pulse, and                            oxygen saturations were monitored continuously. The                            PCF-H190DL ([id number] Olympus pediatric colonoscope                            was introduced through the anus and advanced to the                            the terminal ileum. The terminal ileum, ileocecal                            valve, appendiceal orifice, and rectum were                            photographed. The colonoscopy was technically                            difficult and complex due to multiple diverticula                            in the colon, restricted mobility of the colon and                            a tortuous colon. Successful completion of the                            procedure was aided by withdrawing the scope and  replacing with the 'babyscope' i.e. the                            ultraslim:colonscope and applying abdominal                            pressure minimally to advance around the hepatic                            flexure. The patient tolerated the procedure well.                            The quality of the bowel preparation was adequate. Scope In: 9:09:52 AM Scope Out: 9:54:04 AM Scope Withdrawal Time: 0 hours 26 minutes 46 seconds  Total Procedure Duration: 0 hours 44 minutes 12 seconds  Findings:      Multiple small-mouthed diverticula were found in the sigmoid colon.       There was a mid sigmoid stricture which made passing the pediatric       colonoscope difficult and we elected to exchange scopes as above after a       bit of a effort      Scattered small-mouthed diverticula were found in the descending colon,       transverse colon and ascending colon.      A large polyp was found in the cecum with some worrisome features        including minimal erosions but not frank ulceration. The polyp was       multi-lobulated. Biopsies were taken with a cold forceps for histology.      A medium atypical looking polyp was found in the distal ascending colon.       The polyp was semi-sessile. Biopsies were taken with a cold forceps for       histology.      Seven semi-sessile polyps were found in the transverse colon, ascending       colon and cecum. The polyps were small in size. One was right behind the       IC valve which made complete visualization difficult and we elected to       inject the area which was tattooed with an injection of 5 mL of Spot       (carbon black) just distal to the second to last proximal to mid       transverse polyp. Some of the spot was injected into the lumen but at       least 2 good injections were injected into the wall and the more distal       transverse polyp was small and without worrisome features      The terminal ileum appeared normal.      The exam was otherwise without abnormality. Impression:               - Diverticulosis in the sigmoid colon.                           - Diverticulosis in the descending colon, in the                            transverse colon and in the ascending colon.                           -  One large polyp in the cecum. Biopsied.                           - One medium polyp in the distal ascending colon.                            Biopsied.                           - Seven small polyps in the transverse colon, in                            the ascending colon and in the cecum. Tattooed in                            the proximal to mid transverse just distal to all                            of the polyps but 1 as above.                           - The examined portion of the ileum was normal.                           - The examination was otherwise normal. Recommendation:           - Clear liquid diet today.                           - Continue present  medications.                           - Await pathology results.                           - Repeat colonoscopy PRN for surveillance based on                            pathology results.                           - Return to GI office PRN.                           - Telephone GI clinic for pathology results in 4                            days.                           - Perform a CT scan (computed tomography) of                            abdomen with contrast and pelvis with contrast  today to rule out any other significant worrisome                            abnormalities.                           - Refer to a colo-rectal surgeon at appointment to                            be scheduled pending pathology and CT findings but                            believe surgical removal of these polyps may be                            easier than a significant prolonged colonoscopy                            with also consideration of removing her sigmoid                            stricture at that time versus a subtotal colectomy. Procedure Code(s):        --- Professional ---                           [phone], Colonoscopy, flexible; with biopsy, single                            or multiple                           [id number], Colonoscopy, flexible; with directed                            submucosal injection(s), any substance Diagnosis Code(s):        --- Professional ---                           K63.5, Polyp of colon                           R10.84, Generalized abdominal pain                           R19.5, Other fecal abnormalities                           D50.9, Iron deficiency anemia, unspecified                           R63.4, Abnormal weight loss                           K57.30, Diverticulosis of large intestine without  perforation or abscess without bleeding CPT copyright 2019 [employer]. All rights  reserved. The codes documented in this report are preliminary and upon coder review may  be revised to meet current compliance requirements. Clarene Essex, MD 09/17/2020 10:32:51 AM This report has been signed electronically. Number of Addenda: 0

## 2020-09-17 NOTE — Progress Notes (Signed)
Sheppard Evens Prestia 8:55 AM  Subjective: Patient having some abdominal pain from the prep but no obvious bleeding no other new complaints and her hospital computer chart reviewed and her case discussed with my partner Dr. Alessandra Bevels  Objective: Vital signs stable afebrile no acute distress exam please see preassessment evaluation labs stable  Assessment: Guaiac positive anemia weight loss patient ? etiology  Plan: We rediscussed endoscopy and colonoscopy and will proceed with anesthesia assistance with further work-up and plans pending those findings  [facility]  office [phone] After 5PM or if no answer call [phone]

## 2020-09-17 NOTE — Evaluation (Signed)
Physical Therapy Evaluation Patient Details Name: Tammy Allison MRN: [id number] DOB: 06/24/1935 Today's Date: 09/17/2020   History of Present Illness  Patient is a 85 y/o female who presents with weakness, fatigue, nausea. Admitted with failure to thrive. Found to have symptomatic anemia. Fecal occult blood positive. CXR-bilateral moderate pleural effusion left greater than right.  Initial EKG showed SVT with heart rate in 150s. s/p EGD and colonscopy 11/1 with submucosal tattoo injection. PMH includes HTN, macular degeneration, IBS, hypothyroidism, HLD.  Clinical Impression  Patient presents with nausea, lethargy, generalized weakness, decreased activity tolerance and impaired mobility s/p above. Pt lives at home with 14 y/o spouse and reports using RW for ambulation as well as doing own ADLs. Pt cannot do IADLs anymore and reports minimal walking. Has been feeling bad for the last few weeks and reports some falls. Today, session limited due to nausea and lethargy. Requires min A for bed mobility and pt able to scoot along side bed with supervision. Declined standing and OOB due to feeling bad. Will follow acutely to maximize independence and mobility prior to return home.    Follow Up Recommendations Home health PT;Supervision for mobility/OOB    Equipment Recommendations  None recommended by PT    Recommendations for Other Services       Precautions / Restrictions Precautions Precautions: Fall Restrictions Weight Bearing Restrictions: No      Mobility  Bed Mobility Overal bed mobility: Needs Assistance Bed Mobility: Supine to Sit;Sit to Supine     Supine to sit: Min assist;HOB elevated Sit to supine: Min guard;HOB elevated   General bed mobility comments: increased time/effort, assist with trunk to get upright.    Transfers                 General transfer comment: Deferred due to nausea/sleepiness.  Ambulation/Gait                Stairs             Wheelchair Mobility    Modified Rankin (Stroke Patients Only)       Balance Overall balance assessment: Needs assistance;History of Falls Sitting-balance support: Feet supported;No upper extremity supported Sitting balance-Leahy Scale: Good                                       Pertinent Vitals/Pain Pain Assessment: No/denies pain    Home Living Family/patient expects to be discharged to:: Private residence Living Arrangements: Spouse/significant other (spouse is 25) Available Help at Discharge: Family;Available 24 hours/day Type of Home: House Home Access: Stairs to enter Entrance Stairs-Rails: Psychiatric nurse of Steps: 4 Home Layout: One level Home Equipment: Walker - 2 wheels;Cane - single point;Shower seat - built in;Bedside commode      Prior Function Level of Independence: Independent with assistive device(s)         Comments: Uses RW for ambulation, drives. Cannot do IADLs. DOes own ADLs. Reports falls.     Hand Dominance   Dominant Hand: Right    Extremity/Trunk Assessment   Upper Extremity Assessment Upper Extremity Assessment: Defer to OT evaluation    Lower Extremity Assessment Lower Extremity Assessment: RLE deficits/detail;LLE deficits/detail;Generalized weakness RLE Deficits / Details: Reports weird feeling in toes and on dorsum of foot- "feels like my skin is ripping apart." RLE Sensation: WNL LLE Deficits / Details: Reports weird feeling in toes and on dorsum of foot- "feels like  my skin is ripping apart." LLE Sensation: WNL       Communication   Communication: No difficulties  Cognition Arousal/Alertness: Lethargic Behavior During Therapy: WFL for tasks assessed/performed Overall Cognitive Status: Within Functional Limits for tasks assessed                                 General Comments: Eyes remained closed for more than half of session due to nausea. Also reports she is still  sleepy from the proprofol from her procedure earlier in Am.      General Comments General comments (skin integrity, edema, etc.): VSS on RA.    Exercises     Assessment/Plan    PT Assessment Patient needs continued PT services  PT Problem List Decreased strength;Decreased mobility;Decreased balance;Decreased activity tolerance       PT Treatment Interventions DME instruction;Therapeutic activities;Therapeutic exercise;Gait training;Stair training;Balance training;Patient/family education;Functional mobility training    PT Goals (Current goals can be found in the Care Plan section)  Acute Rehab PT Goals Patient Stated Goal: to feel better, get stronger and go home PT Goal Formulation: With patient Time For Goal Achievement: 10/01/20 Potential to Achieve Goals: Good    Frequency Min 3X/week   Barriers to discharge Inaccessible home environment stairs to enter home    Co-evaluation               AM-PAC PT "6 Clicks" Mobility  Outcome Measure Help needed turning from your back to your side while in a flat bed without using bedrails?: None Help needed moving from lying on your back to sitting on the side of a flat bed without using bedrails?: A Little Help needed moving to and from a bed to a chair (including a wheelchair)?: A Little Help needed standing up from a chair using your arms (e.g., wheelchair or bedside chair)?: A Little Help needed to walk in hospital room?: A Little Help needed climbing 3-5 steps with a railing? : A Little 6 Click Score: 19    End of Session   Activity Tolerance: Patient limited by lethargy;Patient limited by fatigue;Other (comment) (nausea) Patient left: in bed;with call bell/phone within reach;with bed alarm set Nurse Communication: Mobility status PT Visit Diagnosis: Muscle weakness (generalized) (M62.81)    Time: 1416-1430 PT Time Calculation (min) (ACUTE ONLY): 14 min   Charges:   PT Evaluation $PT Eval Moderate Complexity: 1  Mod          Marisa Severin, PT, DPT Acute Rehabilitation Services Pager [phone] Office [phone]      Marguarite Arbour A Sabra Heck 09/17/2020, 3:35 PM

## 2020-09-17 NOTE — Progress Notes (Signed)
Pt's incontinence. Pt had multiple BM with yellowish clear watery stool from bowel preparation.  She ready for EGD and colonoscopy. Pt reequested to sign consent form after discuss the procedure with MD at am. Will hand off to a day shift staff. She's hemodynamically stable. NSR on monitor. Remained afebrile. No acute distress noted. Will continue to monitor.  Kennyth Lose, RN

## 2020-09-18 DIAGNOSIS — I471 Supraventricular tachycardia: Secondary | ICD-10-CM | POA: Diagnosis not present

## 2020-09-18 LAB — CBC
HCT: 28.9 % — ABNORMAL LOW (ref 36.0–46.0)
Hemoglobin: 9.5 g/dL — ABNORMAL LOW (ref 12.0–15.0)
MCH: 30.7 pg (ref 26.0–34.0)
MCHC: 32.9 g/dL (ref 30.0–36.0)
MCV: 93.5 fL (ref 80.0–100.0)
Platelets: 296 10*3/uL (ref 150–400)
RBC: 3.09 MIL/uL — ABNORMAL LOW (ref 3.87–5.11)
RDW: 14.7 % (ref 11.5–15.5)
WBC: 13.1 10*3/uL — ABNORMAL HIGH (ref 4.0–10.5)
nRBC: 0 % (ref 0.0–0.2)

## 2020-09-18 LAB — URINE CULTURE: Culture: >=100000 — AB

## 2020-09-18 LAB — SURGICAL PATHOLOGY

## 2020-09-18 LAB — T3: T3, Total: 51 ng/dL — ABNORMAL LOW (ref 71–180)

## 2020-09-18 LAB — MAGNESIUM: Magnesium: 2.2 mg/dL (ref 1.7–2.4)

## 2020-09-18 MED ORDER — BOOST / RESOURCE BREEZE PO LIQD CUSTOM
1.0000 | Freq: Three times a day (TID) | ORAL | Status: DC
  Administered 2020-09-18 (×2): 1 via ORAL

## 2020-09-18 MED ORDER — HYDRALAZINE HCL 20 MG/ML IJ SOLN: 10.0000 mg | Freq: Four times a day (QID) | INTRAMUSCULAR | Status: DC | PRN

## 2020-09-18 MED ORDER — ENOXAPARIN SODIUM 40 MG/0.4ML ~~LOC~~ SOLN
40.0000 mg | SUBCUTANEOUS | Status: DC
  Administered 2020-09-18: 40 mg via SUBCUTANEOUS
  Filled 2020-09-18: qty 0.4

## 2020-09-18 NOTE — Progress Notes (Signed)
[facility] Progress Note  RANI IDLER 85 y.o. 1935/07/29  CC:  Anemia, colonic polyps with high grade dysplasia  Subjective: Patient reports "hunger pain" but otherwise denies abdominal pain.  States she is hungry and requests a diet. Denies nausea/vomiting.  Last BM was was early this morning.  ROS : Review of Systems  Cardiovascular: Negative for chest pain and palpitations.  Gastrointestinal: Negative for abdominal pain, blood in stool, constipation, diarrhea, heartburn, melena, nausea and vomiting.    Objective: Vital signs in last 24 hours: Vitals:   09/18/20 0405 09/18/20 0918  BP: (!) 124/55 (!) 154/63  Pulse: 87 84  Resp: 20 17  Temp: 97.9 F (36.6 C) 98 F (36.7 C)  SpO2: 96% 96%    Physical Exam:  General:  Elderly, lethargic, cooperative, no acute distress  Head:  Normocephalic, without obvious abnormality, atraumatic  Eyes:  Anicteric sclera, EOMs intact  Lungs:   Clear to auscultation bilaterally, respirations unlabored  Heart:  Regular rate and rhythm, S1, S2 normal  Abdomen:   Soft, non-tender, bowel sounds active all four quadrants,  no guarding or peritoneal signs  Extremities: Extremities normal, atraumatic, no  edema  Pulses: 2+ and symmetric    Lab Results: Recent Labs    09/16/20 0500 09/16/20 0500 09/17/20 0152 09/18/20 0145  NA 135  --  131*  --   K 4.0  --  3.7  --   CL 103  --  101  --   CO2 25  --  24  --   GLUCOSE 101*  --  103*  --   BUN 14  --  13  --   CREATININE 0.94  --  0.83  --   CALCIUM 7.7*  --  7.4*  --   MG 1.7   < > 1.6* 2.2   < > = values in this interval not displayed.   Recent Labs    09/16/20 0500 09/17/20 0152  AST 19 19  ALT 13 12  ALKPHOS 47 50  BILITOT 0.6 0.8  PROT 4.7* 4.7*  ALBUMIN 1.8* 1.8*   Recent Labs    09/15/20 1525 09/15/20 1525 09/16/20 0500 09/16/20 0500 09/17/20 0152 09/18/20 0145  WBC 9.3   < > 9.2   < > 11.1* 13.1*  NEUTROABS 7.3  --  7.3  --   --   --   HGB  10.3*   < > 9.5*   < > 9.4* 9.5*  HCT 32.0*   < > 29.1*   < > 28.9* 28.9*  MCV 95.5   < > 96.4   < > 95.7 93.5  PLT 313   < > 279   < > 281 296   < > = values in this interval not displayed.   No results for input(s): LABPROT, INR in the last 72 hours.    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  EGD unremarkable. -Hgb 9.5 today, stable  Weight loss, failure to thrive  Plan: Discussed with Claiborne Billings, PA with surgical team, who states patient should be seen as an outpatient by one of the colorectal surgeons.  Discussed results with patient, who verbalized understanding.  Recommend surgical referral for discussion of subtotal colectomy. Patient amenable to further discussion with surgical team.  OK to advance to soft diet, with further advancement to regular diet as tolerated.  OK to discharge from a GI standpoint.    [facility]  will sign off.  Thank you for the consultation. Please contact us if we can be of any further assistance during this hospital stay.  Salley Slaughter PA-C 09/18/2020, 12:42 PM  Contact #  [phone]

## 2020-09-18 NOTE — Progress Notes (Signed)
Initial Nutrition Assessment  DOCUMENTATION CODES:   Severe malnutrition in context of chronic illness  INTERVENTION:    Boost Breeze po TID, each supplement provides 250 kcal and 9 grams of protein  MVI daily   NUTRITION DIAGNOSIS:   Severe Malnutrition related to chronic illness as evidenced by moderate fat depletion, severe muscle depletion, energy intake < or equal to 75% for > or equal to 1 month.  GOAL:   Patient will meet greater than or equal to 90% of their needs  MONITOR:   PO intake, Supplement acceptance, Weight trends, Labs, I & O's, Diet advancement  REASON FOR ASSESSMENT:   Consult Assessment of nutrition requirement/status  ASSESSMENT:   Patient with PMH significant for macular degeneration, IBS, HTN, HLD, and GERD. Presents this admission failure to thrive.   Pt endorses a loss appetite over the last 4-5 months due to unknown reasons. States during this time she consumed one meal daily that consisted of a blueberry muffin or bacon with eggs. Tried to drink one Boost Plus daily. She is currently on a clear liquid diet, awaiting surgery consultation given polyps that cannot be ruled out as invasive carcinoma. RD to provide supplementation to maximize kcal and protein.   Pt endorses a UBW of 150 lb and an unintentional wt loss of 40 lb over the last three months. Records indicate pt weighed 130 lb on 1/4 and 118 lb this admission (9.2% wt loss in 11 months, insignificant for time frame).   Medications: MVI, thiamine  Labs: Na 131 (L) Mg 1.6 (L)   NUTRITION - FOCUSED PHYSICAL EXAM:    Most Recent Value  Orbital Region Mild depletion  Upper Arm Region Moderate depletion  Thoracic and Lumbar Region Unable to assess  Buccal Region Moderate depletion  Temple Region Moderate depletion  Clavicle Bone Region Severe depletion  Clavicle and Acromion Bone Region Severe depletion  Scapular Bone Region Unable to assess  Dorsal Hand Moderate depletion  Patellar  Region Moderate depletion  Anterior Thigh Region Moderate depletion  Posterior Calf Region Moderate depletion  Edema (RD Assessment) Mild  Hair Reviewed  Eyes Reviewed  Mouth Reviewed  Skin Reviewed  Nails Reviewed     Diet Order:   Diet Order            Diet clear liquid Room service appropriate? Yes; Fluid consistency: Thin  Diet effective now                 EDUCATION NEEDS:   Education needs have been addressed  Skin:  Skin Assessment: Reviewed RN Assessment  Last BM:  11/1  Height:   Ht Readings from Last 1 Encounters:  09/15/20 5\' 4"  (1.626 m)    Weight:   Wt Readings from Last 1 Encounters:  09/18/20 56.3 kg    BMI:  Body mass index is 21.3 kg/m.  Estimated Nutritional Needs:   Kcal:  1650-1850 kcal  Protein:  80-95 g  Fluid:  >/= 1.6 L/day  [name] RD, LDN Clinical Nutrition Pager listed in [place]

## 2020-09-18 NOTE — Progress Notes (Signed)
Patient's biopsies discussed with Dr. Saralyn Pilar of pathology who said both polyps have high-grade dysplasia but cannot rule in or out invasive carcinoma and I am going to ask for a surgical consultation as per colonoscopy note yesterday and CT did not show any obvious other significant abnormalities and will check on later today

## 2020-09-18 NOTE — Progress Notes (Signed)
Primary Cardiologist:  Marlou Porch  Subjective:  Denies SSCP, palpitations or Dyspnea Going to endo for EGD with Dr Watt Climes   Objective:  Vitals:   09/17/20 2005 09/18/20 0042 09/18/20 0405 09/18/20 0600  BP: (!) 163/66 (!) 128/57 (!) 124/55   Pulse: 88 80 87   Resp: 19 18 20    Temp: 98.2 F (36.8 C) 98 F (36.7 C) 97.9 F (36.6 C)   TempSrc: Oral Oral Oral   SpO2: 98% 96% 96%   Weight:    56.3 kg  Height:        Intake/Output from previous day:  Intake/Output Summary (Last 24 hours) at 09/18/2020 0843 Last data filed at 09/17/2020 1600 Gross per 24 hour  Intake 300 ml  Output 200 ml  Net 100 ml    Physical Exam: Affect appropriate Frail elderly female  HEENT: normal Neck supple with no adenopathy JVP normal no bruits no thyromegaly Lungs no wheezing decreased BS bases  Heart:  S1/S2 no murmur, no rub, gallop or click PMI normal Abdomen: benighn, BS positve, no tenderness, no AAA no bruit.  No HSM or HJR Distal pulses intact with no bruits No edema Neuro non-focal Skin warm and dry No muscular weakness   Lab Results: Basic Metabolic Panel: Recent Labs    09/16/20 0500 09/16/20 0500 09/17/20 0152 09/18/20 0145  NA 135  --  131*  --   K 4.0  --  3.7  --   CL 103  --  101  --   CO2 25  --  24  --   GLUCOSE 101*  --  103*  --   BUN 14  --  13  --   CREATININE 0.94  --  0.83  --   CALCIUM 7.7*  --  7.4*  --   MG 1.7   < > 1.6* 2.2   < > = values in this interval not displayed.   Liver Function Tests: Recent Labs    09/16/20 0500 09/17/20 0152  AST 19 19  ALT 13 12  ALKPHOS 47 50  BILITOT 0.6 0.8  PROT 4.7* 4.7*  ALBUMIN 1.8* 1.8*   Recent Labs    09/15/20 1525  LIPASE 51   CBC: Recent Labs    09/15/20 1525 09/15/20 1525 09/16/20 0500 09/16/20 0500 09/17/20 0152 09/18/20 0145  WBC 9.3   < > 9.2   < > 11.1* 13.1*  NEUTROABS 7.3  --  7.3  --   --   --   HGB 10.3*   < > 9.5*   < > 9.4* 9.5*  HCT 32.0*   < > 29.1*   < > 28.9* 28.9*    MCV 95.5   < > 96.4   < > 95.7 93.5  PLT 313   < > 279   < > 281 296   < > = values in this interval not displayed.   Thyroid Function Tests: Recent Labs    09/16/20 0500  TSH 19.135*   Anemia Panel: Recent Labs    09/16/20 0500  VITAMINB12 985*  FERRITIN 24  TIBC 199*  IRON 29    Imaging: CT ABDOMEN PELVIS W CONTRAST  Result Date: 09/17/2020 CLINICAL DATA:  85 year old with acute abdominal pain. Weight loss. Anemia. EXAM: CT ABDOMEN AND PELVIS WITH CONTRAST TECHNIQUE: Multidetector CT imaging of the abdomen and pelvis was performed using the standard protocol following bolus administration of intravenous contrast. CONTRAST:  158mL OMNIPAQUE IOHEXOL 300 MG/ML  SOLN COMPARISON:  Abdominal CT 05/10/2020.  Chest radiograph yesterday. FINDINGS: Lower chest: Moderate bilateral pleural effusions with compressive atelectasis. Upper normal heart size. Small to moderate hiatal hernia. Hepatobiliary: Stable cysts in the right and left lobe of the liver. No new hepatic lesion. Partially distended gallbladder. No calcified gallstone. There is a Phrygian cap. No biliary dilatation. Pancreas: No ductal dilatation or inflammation. There is a 9 mm cyst in the pancreatic body, series 3, image 30, it was not definitively seen on prior. Spleen: Calcified granuloma.  No suspicious lesion.  Normal in size. Adrenals/Urinary Tract: Normal adrenal glands. No hydronephrosis or perinephric edema. Homogeneous renal enhancement with symmetric excretion on delayed phase imaging. Lower pole left renal cyst again seen. There is also a low-density lesion in the lower most left kidney that is too small to characterize. Urinary bladder is physiologically distended without wall thickening. Stomach/Bowel: Small to moderate hiatal hernia normal positioning of the duodenum and ligament of Treitz. There is a moderate length segment of low densities small bowel wall thickening and edema involving the pelvic bowel loops extending  to the distal ileum. No obstruction, administered enteric contrast reaches the colon. Normal appendix filled with enteric contrast. Mild colonic tortuosity. Multifocal colonic diverticulosis. Mild mural hypertrophy of the sigmoid but no focally inflamed colonic wall thickening or pericolonic edema. Vascular/Lymphatic: Moderate aortic atherosclerosis. No aortic aneurysm. Mesenteric branch vessels are patent, no evidence of mesenteric thrombus or embolic disease. No enlarged lymph nodes in the abdomen or pelvis. Reproductive: Low-density endometrium measuring 13 mm, endometrial thickening versus fluid in the endometrial canal, also seen on prior exam. Enhancing focus involving the left uterine fundus likely a fibroid. There is no adnexal mass. Other: Small to moderate volume of ascites/free fluid in the abdomen and pelvis, including the mesentery. Mild mesenteric edema in the region of abnormal small bowel. No free air or focal fluid collection. Musculoskeletal: T11 compression fracture with vertebral augmentation, new from prior exam. 2 screws traverse the right femoral neck. No acute osseous abnormalities. IMPRESSION: 1. Moderate length segment of low-density small bowel wall thickening and edema involving the pelvic bowel loops extending to the distal ileum. Differential considerations include infectious or inflammatory enteritis or bowel angioedema. Small-to-moderate volume of ascites/free fluid in the abdomen and pelvis. No perforation or abscess. 2. Moderate bilateral pleural effusions with compressive atelectasis. 3. Low-density endometrium measuring 13 mm, endometrial thickening versus fluid in the endometrial canal, also seen on prior exam. This is abnormal in a postmenopausal patient. Recommend nonemergent pelvic ultrasound for characterization on an elective outpatient basis after resolution of acute event. 4. Colonic diverticulosis without diverticulitis. 5. Small to moderate hiatal hernia. 6. A 9 mm cyst  in the pancreatic body was not definitively seen on prior exam. Recommend follow up pre and post contrast MRI/MRCP or pancreatic protocol CT in 2 years. This recommendation follows ACR consensus guidelines: Management of Incidental Pancreatic Cysts: A White Paper of the ACR Incidental Findings Committee. [place] 3491;79:[id number]. Aortic Atherosclerosis (ICD10-I70.0). Electronically Signed   By: Keith Rake M.D.   On: 09/17/2020 22:57   ECHOCARDIOGRAM COMPLETE  Result Date: 09/16/2020    ECHOCARDIOGRAM REPORT   Patient Name:   Tammy Allison Date of Exam: 09/16/2020 Medical Rec #:  [id number]         Height:       64.0 in Accession #:    [id number]        Weight:       115.0 lb Date of Birth:  October 20, 1935  BSA:          1.546 m Patient Age:    40 years          BP:           133/64 mmHg Patient Gender: F                 HR:           81 bpm. Exam Location:  Inpatient Procedure: 2D Echo, Cardiac Doppler and Color Doppler Indications:    Abnormal ECG  History:        Patient has prior history of Echocardiogram examinations, most                 recent 04/25/2019. Risk Factors:Hypertension and Dyslipidemia.                 GERD. H/O TIA.  Sonographer:    Clayton Lefort RDCS (AE) Referring Phys: [id number] ASIA B [place]  1. Left ventricular ejection fraction, by estimation, is 60 to 65%. The left ventricle has normal function. The left ventricle has no regional wall motion abnormalities. Left ventricular diastolic parameters are consistent with Grade I diastolic dysfunction (impaired relaxation).  2. Right ventricular systolic function is normal. The right ventricular size is normal. There is mildly elevated pulmonary artery systolic pressure. The estimated right ventricular systolic pressure is 40.9 mmHg.  3. Left atrial size was mildly dilated.  4. Large pleural effusion in the left lateral region.  5. The mitral valve is degenerative. Mild mitral valve regurgitation. No evidence of  mitral stenosis.  6. The aortic valve is normal in structure. Aortic valve regurgitation is trivial. No aortic stenosis is present.  7. The inferior vena cava is normal in size with greater than 50% respiratory variability, suggesting right atrial pressure of 3 mmHg. FINDINGS  Left Ventricle: Left ventricular ejection fraction, by estimation, is 60 to 65%. The left ventricle has normal function. The left ventricle has no regional wall motion abnormalities. The left ventricular internal cavity size was normal in size. There is  no left ventricular hypertrophy. Left ventricular diastolic parameters are consistent with Grade I diastolic dysfunction (impaired relaxation). Normal left ventricular filling pressure. Right Ventricle: The right ventricular size is normal. No increase in right ventricular wall thickness. Right ventricular systolic function is normal. There is mildly elevated pulmonary artery systolic pressure. The tricuspid regurgitant velocity is 2.85  m/s, and with an assumed right atrial pressure of 3 mmHg, the estimated right ventricular systolic pressure is 73.5 mmHg. Left Atrium: Left atrial size was mildly dilated. Right Atrium: Right atrial size was normal in size. Pericardium: There is no evidence of pericardial effusion. Mitral Valve: The mitral valve is degenerative in appearance. There is moderate thickening of the mitral valve leaflet(s). Mild mitral annular calcification. Mild mitral valve regurgitation. No evidence of mitral valve stenosis. Tricuspid Valve: The tricuspid valve is normal in structure. Tricuspid valve regurgitation is trivial. No evidence of tricuspid stenosis. Aortic Valve: The aortic valve is normal in structure. Aortic valve regurgitation is trivial. No aortic stenosis is present. Aortic valve mean gradient measures 4.0 mmHg. Aortic valve peak gradient measures 6.9 mmHg. Aortic valve area, by VTI measures 2.67 cm. Pulmonic Valve: The pulmonic valve was normal in structure.  Pulmonic valve regurgitation is not visualized. No evidence of pulmonic stenosis. Aorta: The aortic root is normal in size and structure. Venous: The inferior vena cava is normal in size with greater than 50% respiratory variability, suggesting right atrial pressure  of 3 mmHg. IAS/Shunts: The interatrial septum appears to be lipomatous. No atrial level shunt detected by color flow Doppler. Additional Comments: There is a large pleural effusion in the left lateral region.  LEFT VENTRICLE PLAX 2D LVIDd:         3.90 cm  Diastology LVIDs:         3.00 cm  LV e' medial:    7.62 cm/s LV PW:         1.00 cm  LV E/e' medial:  12.7 LV IVS:        1.00 cm  LV e' lateral:   9.03 cm/s LVOT diam:     1.90 cm  LV E/e' lateral: 10.7 LV SV:         75 LV SV Index:   49 LVOT Area:     2.84 cm  RIGHT VENTRICLE             IVC RV Basal diam:  3.30 cm     IVC diam: 1.80 cm RV S prime:     14.10 cm/s TAPSE (M-mode): 1.8 cm LEFT ATRIUM             Index       RIGHT ATRIUM           Index LA diam:        3.20 cm 2.07 cm/m  RA Area:     15.50 cm LA Vol (A2C):   62.8 ml 40.61 ml/m RA Volume:   35.50 ml  22.96 ml/m LA Vol (A4C):   57.1 ml 36.93 ml/m LA Biplane Vol: 62.9 ml 40.68 ml/m  AORTIC VALVE AV Area (Vmax):    2.51 cm AV Area (Vmean):   2.52 cm AV Area (VTI):     2.67 cm AV Vmax:           131.00 cm/s AV Vmean:          93.300 cm/s AV VTI:            0.281 m AV Peak Grad:      6.9 mmHg AV Mean Grad:      4.0 mmHg LVOT Vmax:         116.00 cm/s LVOT Vmean:        82.900 cm/s LVOT VTI:          0.265 m LVOT/AV VTI ratio: 0.94  AORTA Ao Root diam: 3.30 cm MITRAL VALVE                TRICUSPID VALVE MV Area (PHT): 2.50 cm     TR Peak grad:   32.5 mmHg MV Decel Time: 304 msec     TR Vmax:        285.00 cm/s MV E velocity: 96.70 cm/s MV A velocity: 133.00 cm/s  SHUNTS MV E/A ratio:  0.73         Systemic VTI:  0.26 m                             Systemic Diam: 1.90 cm Fransico Him MD Electronically signed by Fransico Him MD  Signature Date/Time: 09/16/2020/2:35:03 PM    Final     Cardiac Studies:  ECG: SR PAC;s no acute ST changes    Telemetry: off tele currently   Echo: 09/16/20 EF 60-65%  Mild MR mild LAE   Medications:   . feeding supplement  237 mL Oral BID BM  . gabapentin  300 mg Oral QHS  . hydrALAZINE  50 mg Oral BID  . levothyroxine  100 mcg Oral QAC breakfast  . losartan  100 mg Oral QPM  . metoprolol tartrate  25 mg Oral BID  . multivitamin with minerals  1 tablet Oral Daily  . pantoprazole  40 mg Oral Daily  . rosuvastatin  5 mg Oral QHS  . thiamine  100 mg Oral Daily       Assessment/Plan:   1. Arrhythmia:  Not PAF more SVT ? Related to COVID vaccine continue low dose lopressor   2. Thyroid: continue synthroid replacement  3. HTN:  Well controlled.  Continue current medications and low sodium Dash type diet.    4. Failure to thrive: weight loss and anemia. Lots of polyps noted on colonoscopy yesterday some with malignant features Path pending Patient is not sure she would have colon surgery if recommended   Tammy Allison 09/18/2020, 8:43 AM

## 2020-09-18 NOTE — Progress Notes (Addendum)
PROGRESS NOTE    CHARIDY CAPPELLETTI  TDD:[id number] DOB: 04-Feb-1935 DOA: 09/15/2020 PCP: [name], MD   Brief Narrative:  Patient is 85 year old female with past medical history of macular degeneration, IBS, hypothyroidism, hypertension, hyperlipidemia, GERD presents to emergency department with generalized weakness, fatigue and malaise.  Denies hematemesis, melena, over-the-counter use of NSAIDs and not on any anticoagulation.  In ED: Patient was afebrile, tachycardic, tachypneic, blood pressure: 201/90, maintaining oxygen saturation on room air, no leukocytosis, hypokalemia with potassium 3.3.  Kidney function within normal limit.  Albumin: 2.1.  BNP: 223.8.  Initial troponin XIV.  UA was borderline showed hematuria.  Fecal occult blood positive.  Chest x-ray showed bilateral moderate pleural effusion left greater than right.  Initial EKG showed SVT with heart rate in 150s.  Repeat showed sinus tach with heart rate of 104.  Patient was given IV fluids in ED.  GI and cardiology was consulted.  Patient admitted her for further evaluation and management of symptomatic anemia and SVT.  Assessment & Plan:  Symptomatic anemia: -In the setting of tubulovillous adenomas with high-grade glandular dysplasia. -H&H trended down from 12.4/36.8 to 9.5/29.1 in 4 months. -POC occult blood is positive. -CT abdomen/pelvis with contrast reviewed. -EGD: WNL, reviewed colonoscopy result.   -As per GI note: Considering high risk of significant multiple polypectomy on right side of colon and diverticular stricture which may require surgery for removal of the polyps and if she has cancer in them she will need surgery at that point as well-->  GI discussed with general surgery who stated that patient should be seen as an outpatient by one of their colorectal surgeons. -GI signed off. -Advance diet as tolerated.  Monitor H&H.  Arrhythmias: Stable -SVT? -Reviewed EKG and echo.  Initial troponin negative.   Patient denies ACS symptoms. -Appreciate cardiology input. -Continue metoprolol 25 mg twice daily.  Monitor on telemetry.  Severe protein calorie malnutrition: -BMI: 21 low albumin-2.1.  Protein shakes between meals. -Appreciate dietitian recommendation  Hypokalemia: Resolved  Hypomagnesemia: Replenished.   Hypothyroidism: TSH is elevated-19.135 -Patient tells me that her TSH was recently increased from 88 mcg to 100 mcg 2 weeks ago by her PCP. -Continue same dose of levothyroxine.  Repeat TSH in 4 to 6 weeks outpatient.  Bilateral moderate pleural effusion: Left greater than right: -Hypoalbuminemia could be a contributing factor -Strict INO's and daily weight.  asix was hold at the time of admission as patient appears dry on exam.L -Echo shows ejection fraction of 60 to 65% with grade 1 diastolic dysfunction.   -Patient is maintaining oxygen saturation on room air.  No respiratory distress. -Repeat chest x-ray shows slight decrease in left pleural effusion.  Persistent dense opacity at the left lung base.  Hypertension: Elevated this morning -Continue home meds hydralazine, losartan -Monitor blood pressure closely -IV hydralazine as needed for blood pressure more than 160/100.  Hyperlipidemia: Continue Crestor  DVT prophylaxis: SCD.  H&H is stable.  Will start patient on prophylaxis Lovenox considering high risk of hypercoagulability state secondary to underlying? Malignancy code Status: DNR Family Communication: None present at bedside.  Plan of care discussed with patient in length and she verbalized understanding and agreed with it.  Disposition Plan: Home with home health/PT  Consultants:   GI  Cardiology  General surgery  Procedures:   Echo  EGD  Colonoscopy  CT abdomen/pelvis  Antimicrobials:   None  Status is: Inpatient   Dispo: The patient is from: Home  Anticipated d/c is to: Home with home health/PT              Anticipated d/c date  is: 09/19/2020              Patient currently not medically stable for the discharge.   Subjective: Patient seen and examined.  Tells me that she feels much better than yesterday.  Denies chest pain, shortness of breath, palpitation, hematemesis or melena.  Objective: Vitals:   09/18/20 0042 09/18/20 0405 09/18/20 0600 09/18/20 0918  BP: (!) 128/57 (!) 124/55  (!) 154/63  Pulse: 80 87  84  Resp: 18 20  17   Temp: 98 F (36.7 C) 97.9 F (36.6 C)  98 F (36.7 C)  TempSrc: Oral Oral  Oral  SpO2: 96% 96%  96%  Weight:   56.3 kg   Height:        Intake/Output Summary (Last 24 hours) at 09/18/2020 1425 Last data filed at 09/17/2020 1600 Gross per 24 hour  Intake 100 ml  Output 200 ml  Net -100 ml   Filed Weights   09/15/20 1743 09/17/20 0344 09/18/20 0600  Weight: 52.2 kg 53.9 kg 56.3 kg    Examination: General exam: Appears calm and comfortable, on room air, communicating well Respiratory system: Clear to auscultation. Respiratory effort normal. Cardiovascular system: S1 & S2 heard, RRR. No JVD, murmurs, rubs, gallops or clicks. No pedal edema. Gastrointestinal system: Abdomen is nondistended, soft and nontender. No organomegaly or masses felt. Normal bowel sounds heard. Central nervous system: Alert and oriented. No focal neurological deficits. Extremities: Symmetric 5 x 5 power. Skin: No rashes, lesions or ulcers. Psychiatry: Judgement and insight appear normal. Mood & affect appropriate.   Data Reviewed: I have personally reviewed following labs and imaging studies  CBC: Recent Labs  Lab 09/15/20 1525 09/16/20 0500 09/17/20 0152 09/18/20 0145  WBC 9.3 9.2 11.1* 13.1*  NEUTROABS 7.3 7.3  --   --   HGB 10.3* 9.5* 9.4* 9.5*  HCT 32.0* 29.1* 28.9* 28.9*  MCV 95.5 96.4 95.7 93.5  PLT 313 279 281 035   Basic Metabolic Panel: Recent Labs  Lab 09/15/20 1525 09/15/20 1929 09/16/20 0500 09/17/20 0152 09/18/20 0145  NA 133*  --  135 131*  --   K 3.3*  --  4.0 3.7   --   CL 99  --  103 101  --   CO2 26  --  25 24  --   GLUCOSE 111*  --  101* 103*  --   BUN 15  --  14 13  --   CREATININE 0.88  --  0.94 0.83  --   CALCIUM 7.8*  --  7.7* 7.4*  --   MG  --  1.7 1.7 1.6* 2.2   GFR: Estimated Creatinine Clearance: 44.3 mL/min (by C-G formula based on SCr of 0.83 mg/dL). Liver Function Tests: Recent Labs  Lab 09/15/20 1525 09/16/20 0500 09/17/20 0152  AST 19 19 19   ALT 13 13 12   ALKPHOS 58 47 50  BILITOT 0.5 0.6 0.8  PROT 5.4* 4.7* 4.7*  ALBUMIN 2.1* 1.8* 1.8*   Recent Labs  Lab 09/15/20 1525  LIPASE 51   No results for input(s): AMMONIA in the last 168 hours. Coagulation Profile: No results for input(s): INR, PROTIME in the last 168 hours. Cardiac Enzymes: No results for input(s): CKTOTAL, CKMB, CKMBINDEX, TROPONINI in the last 168 hours. BNP (last 3 results) No results for input(s): PROBNP in the last 8760  hours. HbA1C: No results for input(s): HGBA1C in the last 72 hours. CBG: No results for input(s): GLUCAP in the last 168 hours. Lipid Profile: No results for input(s): CHOL, HDL, LDLCALC, TRIG, CHOLHDL, LDLDIRECT in the last 72 hours. Thyroid Function Tests: Recent Labs    09/16/20 0500  TSH 19.135*  FREET4 0.85   Anemia Panel: Recent Labs    09/16/20 0500  VITAMINB12 985*  FERRITIN 24  TIBC 199*  IRON 29   Sepsis Labs: No results for input(s): PROCALCITON, LATICACIDVEN in the last 168 hours.  Recent Results (from the past 240 hour(s))  Urine culture     Status: Abnormal (Preliminary result)   Collection Time: 09/15/20  4:52 PM   Specimen: Urine, Random  Result Value Ref Range Status   Specimen Description URINE, RANDOM  Final   Special Requests NONE  Final   Culture (A)  Final    >=100,000 COLONIES/mL ESCHERICHIA COLI SUSCEPTIBILITIES TO FOLLOW Performed at [facility], [address] [place], [place] [address]    Report Status PENDING  Incomplete  Respiratory Panel by RT PCR (Flu A&B, Covid) -  Nasopharyngeal Swab     Status: None   Collection Time: 09/15/20  7:58 PM   Specimen: Nasopharyngeal Swab  Result Value Ref Range Status   SARS Coronavirus 2 by RT PCR NEGATIVE NEGATIVE Final    Comment: (NOTE) SARS-CoV-2 target nucleic acids are NOT DETECTED.  The SARS-CoV-2 RNA is generally detectable in upper respiratoy specimens during the acute phase of infection. The lowest concentration of SARS-CoV-2 viral copies this assay can detect is 131 copies/mL. A negative result does not preclude SARS-Cov-2 infection and should not be used as the sole basis for treatment or other patient management decisions. A negative result may occur with  improper specimen collection/handling, submission of specimen other than nasopharyngeal swab, presence of viral mutation(s) within the areas targeted by this assay, and inadequate number of viral copies (<131 copies/mL). A negative result must be combined with clinical observations, patient history, and epidemiological information. The expected result is Negative.  Fact Sheet for Patients:  PinkCheek.be  Fact Sheet for Healthcare Providers:  GravelBags.it  This test is no t yet approved or cleared by the [place] FDA and  has been authorized for detection and/or diagnosis of SARS-CoV-2 by FDA under an Emergency Use Authorization (EUA). This EUA will remain  in effect (meaning this test can be used) for the duration of the COVID-19 declaration under Section 564(b)(1) of the Act, 21 U.S.C. section 360bbb-3(b)(1), unless the authorization is terminated or revoked sooner.     Influenza A by PCR NEGATIVE NEGATIVE Final   Influenza B by PCR NEGATIVE NEGATIVE Final    Comment: (NOTE) The Xpert Xpress SARS-CoV-2/FLU/RSV assay is intended as an aid in  the diagnosis of influenza from Nasopharyngeal swab specimens and  should not be used as a sole basis for treatment. Nasal washings and    aspirates are unacceptable for Xpert Xpress SARS-CoV-2/FLU/RSV  testing.  Fact Sheet for Patients: PinkCheek.be  Fact Sheet for Healthcare Providers: GravelBags.it  This test is not yet approved or cleared by the [place] FDA and  has been authorized for detection and/or diagnosis of SARS-CoV-2 by  FDA under an Emergency Use Authorization (EUA). This EUA will remain  in effect (meaning this test can be used) for the duration of the  Covid-19 declaration under Section 564(b)(1) of the Act, 21  U.S.C. section 360bbb-3(b)(1), unless the authorization is  terminated or  revoked. Performed at [facility], [place] [address] [place], [place] [address]       Radiology Studies: CT ABDOMEN PELVIS W CONTRAST  Result Date: 09/17/2020 CLINICAL DATA:  85 year old with acute abdominal pain. Weight loss. Anemia. EXAM: CT ABDOMEN AND PELVIS WITH CONTRAST TECHNIQUE: Multidetector CT imaging of the abdomen and pelvis was performed using the standard protocol following bolus administration of intravenous contrast. CONTRAST:  152mL OMNIPAQUE IOHEXOL 300 MG/ML  SOLN COMPARISON:  Abdominal CT 05/10/2020.  Chest radiograph yesterday. FINDINGS: Lower chest: Moderate bilateral pleural effusions with compressive atelectasis. Upper normal heart size. Small to moderate hiatal hernia. Hepatobiliary: Stable cysts in the right and left lobe of the liver. No new hepatic lesion. Partially distended gallbladder. No calcified gallstone. There is a Phrygian cap. No biliary dilatation. Pancreas: No ductal dilatation or inflammation. There is a 9 mm cyst in the pancreatic body, series 3, image 30, it was not definitively seen on prior. Spleen: Calcified granuloma.  No suspicious lesion.  Normal in size. Adrenals/Urinary Tract: Normal adrenal glands. No hydronephrosis or perinephric edema. Homogeneous renal enhancement with symmetric excretion on delayed phase  imaging. Lower pole left renal cyst again seen. There is also a low-density lesion in the lower most left kidney that is too small to characterize. Urinary bladder is physiologically distended without wall thickening. Stomach/Bowel: Small to moderate hiatal hernia normal positioning of the duodenum and ligament of Treitz. There is a moderate length segment of low densities small bowel wall thickening and edema involving the pelvic bowel loops extending to the distal ileum. No obstruction, administered enteric contrast reaches the colon. Normal appendix filled with enteric contrast. Mild colonic tortuosity. Multifocal colonic diverticulosis. Mild mural hypertrophy of the sigmoid but no focally inflamed colonic wall thickening or pericolonic edema. Vascular/Lymphatic: Moderate aortic atherosclerosis. No aortic aneurysm. Mesenteric branch vessels are patent, no evidence of mesenteric thrombus or embolic disease. No enlarged lymph nodes in the abdomen or pelvis. Reproductive: Low-density endometrium measuring 13 mm, endometrial thickening versus fluid in the endometrial canal, also seen on prior exam. Enhancing focus involving the left uterine fundus likely a fibroid. There is no adnexal mass. Other: Small to moderate volume of ascites/free fluid in the abdomen and pelvis, including the mesentery. Mild mesenteric edema in the region of abnormal small bowel. No free air or focal fluid collection. Musculoskeletal: T11 compression fracture with vertebral augmentation, new from prior exam. 2 screws traverse the right femoral neck. No acute osseous abnormalities. IMPRESSION: 1. Moderate length segment of low-density small bowel wall thickening and edema involving the pelvic bowel loops extending to the distal ileum. Differential considerations include infectious or inflammatory enteritis or bowel angioedema. Small-to-moderate volume of ascites/free fluid in the abdomen and pelvis. No perforation or abscess. 2. Moderate  bilateral pleural effusions with compressive atelectasis. 3. Low-density endometrium measuring 13 mm, endometrial thickening versus fluid in the endometrial canal, also seen on prior exam. This is abnormal in a postmenopausal patient. Recommend nonemergent pelvic ultrasound for characterization on an elective outpatient basis after resolution of acute event. 4. Colonic diverticulosis without diverticulitis. 5. Small to moderate hiatal hernia. 6. A 9 mm cyst in the pancreatic body was not definitively seen on prior exam. Recommend follow up pre and post contrast MRI/MRCP or pancreatic protocol CT in 2 years. This recommendation follows ACR consensus guidelines: Management of Incidental Pancreatic Cysts: A White Paper of the ACR Incidental Findings Committee. [place] 4315;[id number]. Aortic Atherosclerosis (ICD10-I70.0). Electronically Signed   By: Aurther Loft.D.  On: 09/17/2020 22:57    Scheduled Meds: . feeding supplement  1 Container Oral TID BM  . gabapentin  300 mg Oral QHS  . hydrALAZINE  50 mg Oral BID  . levothyroxine  100 mcg Oral QAC breakfast  . losartan  100 mg Oral QPM  . metoprolol tartrate  25 mg Oral BID  . multivitamin with minerals  1 tablet Oral Daily  . pantoprazole  40 mg Oral Daily  . rosuvastatin  5 mg Oral QHS  . thiamine  100 mg Oral Daily   Continuous Infusions:   LOS: 3 days   Time spent: 40 minutes   Aden Sek Loann Quill, MD [facility]  If 7PM-7AM, please contact night-coverage [email or web address] 09/18/2020, 2:25 PM

## 2020-09-18 NOTE — Progress Notes (Signed)
Physical Therapy Treatment Patient Details Name: Tammy Allison MRN: [id number] DOB: Oct 22, 1935 Today's Date: 09/18/2020    History of Present Illness Patient is a 85 y/o female who presents with weakness, fatigue, nausea. Admitted with failure to thrive. Found to have symptomatic anemia. Fecal occult blood positive. CXR-bilateral moderate pleural effusion left greater than right.  Initial EKG showed SVT with heart rate in 150s. s/p EGD and colonscopy 11/1 with submucosal tattoo injection. PMH includes HTN, macular degeneration, IBS, hypothyroidism, HLD.    PT Comments    Patient progressing well towards PT goals. Reports sleeping most of yesterday and feeling better today. Improved ambulation distance with use of RW for support. Pt impulsive with gait speed and some drifting noted as well but no overt LOB. Close min guard for safety. Reports LEs get tired towards end of ambulation. Pt eager to get back to working with her OPPT at discharge. Disposition updated to OPPT due to improvement today. VSS on RA. Will follow.    Follow Up Recommendations  Supervision for mobility/OOB;Outpatient PT (continue OPPT)     Equipment Recommendations  None recommended by PT    Recommendations for Other Services       Precautions / Restrictions Precautions Precautions: Fall Restrictions Weight Bearing Restrictions: No    Mobility  Bed Mobility Overal bed mobility: Needs Assistance Bed Mobility: Supine to Sit;Sit to Supine     Supine to sit: HOB elevated;Min assist Sit to supine: Supervision;HOB elevated   General bed mobility comments: increased time/effort, assist with trunk to get upright.  Transfers Overall transfer level: Needs assistance Equipment used: Rolling walker (2 wheeled) Transfers: Sit to/from Stand Sit to Stand: Min guard         General transfer comment: Min guard for safety. Stood from [employer], from toilet x1.  Ambulation/Gait Ambulation/Gait assistance: Min  guard;Min assist Gait Distance (Feet): 150 Feet Assistive device: Rolling walker (2 wheeled) Gait Pattern/deviations: Step-through pattern;Decreased stride length;Trunk flexed;Drifts right/left   Gait velocity interpretation: 1.31 - 2.62 ft/sec, indicative of limited community ambulator General Gait Details: Unsteady impulsive gait pattern with RW, 2/4 DOE. Drifting to right/left. Reports tiredness in LEs towards end of ambulation.   Stairs             Wheelchair Mobility    Modified Rankin (Stroke Patients Only)       Balance Overall balance assessment: Needs assistance;History of Falls Sitting-balance support: Feet supported;No upper extremity supported Sitting balance-Leahy Scale: Good Sitting balance - Comments: total A to donn socks, "i don't think i can get down there right now."   Standing balance support: During functional activity Standing balance-Leahy Scale: Fair Standing balance comment: Able to stand at sink and wash hands with no UE support, close min guard.                            Cognition Arousal/Alertness: Awake/alert Behavior During Therapy: Impulsive;Flat affect Overall Cognitive Status: Within Functional Limits for tasks assessed                                 General Comments: for basic mobility tasks.      Exercises      General Comments General comments (skin integrity, edema, etc.): VSS on RA.      Pertinent Vitals/Pain Pain Assessment: No/denies pain    Home Living  Prior Function            PT Goals (current goals can now be found in the care plan section) Progress towards PT goals: Progressing toward goals    Frequency    Min 3X/week      PT Plan Discharge plan needs to be updated    Co-evaluation              AM-PAC PT "6 Clicks" Mobility   Outcome Measure  Help needed turning from your back to your side while in a flat bed without using bedrails?:  None Help needed moving from lying on your back to sitting on the side of a flat bed without using bedrails?: A Little Help needed moving to and from a bed to a chair (including a wheelchair)?: A Little Help needed standing up from a chair using your arms (e.g., wheelchair or bedside chair)?: A Little Help needed to walk in hospital room?: A Little Help needed climbing 3-5 steps with a railing? : A Little 6 Click Score: 19    End of Session Equipment Utilized During Treatment: Gait belt Activity Tolerance: Patient tolerated treatment well;Patient limited by fatigue Patient left: in bed;with call bell/phone within reach;with bed alarm set Nurse Communication: Mobility status PT Visit Diagnosis: Muscle weakness (generalized) (M62.81)     Time: 9692-4932 PT Time Calculation (min) (ACUTE ONLY): 16 min  Charges:  $Gait Training: 8-22 mins                     Marisa Severin, PT, DPT Acute Rehabilitation Services Pager [phone] Office [phone]       Marguarite Arbour A Sabra Heck 09/18/2020, 12:04 PM

## 2020-09-19 ENCOUNTER — Encounter (HOSPITAL_COMMUNITY): Payer: Self-pay | Admitting: Gastroenterology

## 2020-09-19 DIAGNOSIS — I498 Other specified cardiac arrhythmias: Secondary | ICD-10-CM | POA: Diagnosis not present

## 2020-09-19 LAB — CBC
HCT: 26.5 % — ABNORMAL LOW (ref 36.0–46.0)
Hemoglobin: 8.7 g/dL — ABNORMAL LOW (ref 12.0–15.0)
MCH: 30.9 pg (ref 26.0–34.0)
MCHC: 32.8 g/dL (ref 30.0–36.0)
MCV: 94 fL (ref 80.0–100.0)
Platelets: 285 10*3/uL (ref 150–400)
RBC: 2.82 MIL/uL — ABNORMAL LOW (ref 3.87–5.11)
RDW: 14.6 % (ref 11.5–15.5)
WBC: 8.9 10*3/uL (ref 4.0–10.5)
nRBC: 0 % (ref 0.0–0.2)

## 2020-09-19 LAB — BASIC METABOLIC PANEL
Anion gap: 7 (ref 5–15)
BUN: 19 mg/dL (ref 8–23)
CO2: 22 mmol/L (ref 22–32)
Calcium: 7.3 mg/dL — ABNORMAL LOW (ref 8.9–10.3)
Chloride: 98 mmol/L (ref 98–111)
Creatinine, Ser: 0.99 mg/dL (ref 0.44–1.00)
GFR, Estimated: 57 mL/min — ABNORMAL LOW (ref >60)
Glucose, Bld: 103 mg/dL — ABNORMAL HIGH (ref 70–99)
Potassium: 3.7 mmol/L (ref 3.5–5.1)
Sodium: 127 mmol/L — ABNORMAL LOW (ref 135–145)

## 2020-09-19 MED ORDER — METOPROLOL TARTRATE 25 MG PO TABS
25.0000 mg | ORAL_TABLET | Freq: Two times a day (BID) | ORAL | 0 refills | Status: DC
Start: 2020-09-19 — End: 2020-11-16

## 2020-09-19 NOTE — Progress Notes (Signed)
Physical Therapy Treatment Patient Details Name: Tammy Allison MRN: [id number] DOB: 05/05/1935 Today's Date: 09/19/2020    History of Present Illness Patient is a 85 y/o female who presents with weakness, fatigue, nausea. Admitted with failure to thrive. Found to have symptomatic anemia. Fecal occult blood positive. CXR-bilateral moderate pleural effusion left greater than right.  Initial EKG showed SVT with heart rate in 150s. s/p EGD and colonscopy 11/1 with submucosal tattoo injection. PMH includes HTN, macular degeneration, IBS, hypothyroidism, HLD.    PT Comments    Pt reports husband can assist and daughter lives nearby and can help with iADLs. Pt would like to resume therapy with her [facility]. Pt requires increased effort and time for bed mobility but ultimately able to get herself to EoB. Pt is min guard for bed mobility and ambulation of 230 feet with RW. Pt will need supervision for OOB mobility but should be able return home from family. Pt to d/c home this afternoon.     Follow Up Recommendations  Supervision for mobility/OOB;Outpatient PT (return to current [facility] )     Equipment Recommendations  None recommended by PT       Precautions / Restrictions Precautions Precautions: Fall Restrictions Weight Bearing Restrictions: Yes Other Position/Activity Restrictions: sternal    Mobility  Bed Mobility Overal bed mobility: Needs Assistance Bed Mobility: Supine to Sit;Sit to Supine     Supine to sit: HOB elevated;Min guard Sit to supine: Supervision;HOB elevated   General bed mobility comments: increased effort for getting LE off bed and especially pushing up to bring trunk to upright   Transfers Overall transfer level: Needs assistance Equipment used: Rolling walker (2 wheeled) Transfers: Sit to/from Stand Sit to Stand: Min guard         General transfer comment: Min guard for safety  Ambulation/Gait Ambulation/Gait assistance: Min guard Gait Distance (Feet):  230 Feet Assistive device: Rolling walker (2 wheeled) Gait Pattern/deviations: Step-through pattern;Decreased stride length;Trunk flexed;Drifts right/left Gait velocity: slowed Gait velocity interpretation: 1.31 - 2.62 ft/sec, indicative of limited community ambulator General Gait Details: impulsive gait with difficulty navigating RW around obstacles, 2/4 DoE at end of ambulation and c/o LE weakness         Balance Overall balance assessment: Needs assistance;History of Falls Sitting-balance support: Feet supported;No upper extremity supported Sitting balance-Leahy Scale: Good     Standing balance support: During functional activity Standing balance-Leahy Scale: Fair Standing balance comment: able to static stand and put on mask                            Cognition Arousal/Alertness: Awake/alert Behavior During Therapy: Impulsive;Flat affect Overall Cognitive Status: Within Functional Limits for tasks assessed                                 General Comments: for basic mobility tasks.      Exercises      General Comments General comments (skin integrity, edema, etc.): VSS on RA      Pertinent Vitals/Pain Pain Assessment: No/denies pain    Home Living Family/patient expects to be discharged to:: Private residence Living Arrangements: Spouse/significant other                      PT Goals (current goals can now be found in the care plan section) Acute Rehab PT Goals Patient Stated Goal: to get home  PT Goal Formulation: With patient Time For Goal Achievement: 10/01/20 Potential to Achieve Goals: Good Progress towards PT goals: Progressing toward goals    Frequency    Min 3X/week      PT Plan Current plan remains appropriate       AM-PAC PT "6 Clicks" Mobility   Outcome Measure  Help needed turning from your back to your side while in a flat bed without using bedrails?: None Help needed moving from lying on your back to  sitting on the side of a flat bed without using bedrails?: A Little Help needed moving to and from a bed to a chair (including a wheelchair)?: A Little Help needed standing up from a chair using your arms (e.g., wheelchair or bedside chair)?: A Little Help needed to walk in hospital room?: A Little Help needed climbing 3-5 steps with a railing? : A Little 6 Click Score: 19    End of Session Equipment Utilized During Treatment: Gait belt Activity Tolerance: Patient tolerated treatment well;Patient limited by fatigue Patient left: in bed;with call bell/phone within reach;with bed alarm set Nurse Communication: Mobility status PT Visit Diagnosis: Muscle weakness (generalized) (M62.81)     Time: 1050-1105 PT Time Calculation (min) (ACUTE ONLY): 15 min  Charges:  $Gait Training: 8-22 mins                     Akio Hudnall B. Migdalia Dk PT, DPT Acute Rehabilitation Services Pager [phone] Office [phone]    [place] 09/19/2020, 11:14 AM

## 2020-09-19 NOTE — TOC Transition Note (Signed)
Transition of Care (TOC) - CM/SW Discharge Note Marvetta Gibbons RN, BSN Transitions of Care Unit 4E- RN Case Manager See Treatment Team for direct phone #    Patient Details  Name: Tammy Allison MRN: [id number] Date of Birth: 1935-08-08  Transition of Care [facility]) CM/SW Contact:  [name], RN Phone Number: 09/19/2020, 12:39 PM   Clinical Narrative:    Pt stable for transition to home today, per PT note pt was doing outpt PT prior to admit and would like to continue to go to outpt PT. Pt to f/u with her outpt PT.  MD request appointments for f/u with PCP and outpt surgery. - call made to PCP- appointment made for 11/5 for f/u, call also made to [facility] for appointment and f/u made for 11/16. Appointments have been placed on AVS.    Final next level of care: OP Rehab Barriers to Discharge: No Barriers Identified   Patient Goals and CMS Choice Patient states their goals for this hospitalization and ongoing recovery are:: return home and continue outpt PT   Choice offered to / list presented to : NA  Discharge Placement                 Home       Discharge Plan and Services   Discharge Planning Services: CM Consult, Follow-up appt scheduled Post Acute Care Choice: NA          DME Arranged: N/A DME Agency: NA       HH Arranged: NA HH Agency: NA        Social Determinants of Health (SDOH) Interventions     Readmission Risk Interventions Readmission Risk Prevention Plan 09/19/2020  Transportation Screening Complete  PCP or Specialist Appt within 5-7 Days Complete  Home Care Screening Complete  Medication Review (RN CM) Complete  Some recent data might be hidden

## 2020-09-19 NOTE — Discharge Summary (Addendum)
Physician Discharge Summary  Tammy Allison ZMO:[id number] DOB: [birth year] DOA: 09/15/2020  PCP: [name], MD  Admit date: 09/15/2020 Discharge date: 09/19/2020  Admitted From: home Disposition:  home  Recommendations for Outpatient Follow-up:  1. Follow-up with PCP in 1 week. 2. Repeat CBC and BMP on follow-up visit.  Monitor H&H very closely outpatient. 3. Follow-up with colorectal surgery as scheduled 4. Follow-up with GI outpatient 5. Continue PT outpatient 6. Follow-up with cardiology as scheduled  Home Health: none  Equipment/Devices:none Discharge Condition: Stable CODE STATUS: DNR Diet recommendation: Low-sodium diet.  Brief/Interim Summary: Patient is 85 year old female with past medical history of macular degeneration, IBS, hypothyroidism, hypertension, hyperlipidemia, GERD presents to emergency department with generalized weakness, fatigue and malaise.  Denies hematemesis, melena, over-the-counter use of NSAIDs and not on any anticoagulation.  In ED: Patient was afebrile, tachycardic, tachypneic, blood pressure: 201/90, maintaining oxygen saturation on room air, no leukocytosis, hypokalemia with potassium 3.3.  Kidney function within normal limit.  Albumin: 2.1.  BNP: 223.8.  Initial troponin XIV.  UA was borderline showed hematuria.  Fecal occult blood positive.  Chest x-ray showed bilateral moderate pleural effusion left greater than right.  Initial EKG showed SVT with heart rate in 150s.  Repeat showed sinus tach with heart rate of 104.  Patient was given IV fluids in ED.  GI and cardiology was consulted.  Patient admitted  for further evaluation and management of symptomatic anemia and SVT.  Symptomatic anemia: -In the setting of tubulovillous adenomas with high-grade glandular dysplasia. -H&H trended down from 12.4/36.8 to 9.5/29.1 in 4 months. -POC occult blood was positive. -EGD: WNL, reviewed colonoscopy: Shows diverticulosis in sigmoid colon, descending colon  and transverse colon.  Polyps in cecum, distal ascending colon, transverse colon and descending colon. -As per GI note: Considering high risk of significant multiple polypectomy on right side of colon and diverticular stricture which may require surgery for removal of the polyps and if she has cancer in them she will need surgery at that point as well-->  GI discussed with general surgery who stated that patient should be seen as an outpatient by one of their colorectal surgeons. -GI signed off. -Patient does not want to undergo surgery at this point however she would like to discuss other treatment options with surgeon as outpatient.  She eager to go home today. -All outpatient appointments scheduled at the time of discharge.  Arrhythmias: Stable -SVT?  Related to COVID-19 vaccine? -Cardiology consulted and patient was started on metoprolol 25 mg twice daily.  Her heart rate improved significantly.  Cardiology signed off and arranged outpatient follow-up with Dr. Marlou Porch.  Severe protein calorie malnutrition: -BMI: 21 low albumin-2.1.  Protein shakes between meals. -Appreciate dietitian recommendation  Hypokalemia: Resolved  Hypomagnesemia: Replenished.   Hypothyroidism: TSH is elevated-19.135 -Patient stated that her levothyroxine dose increased from 88 mcg to 100 mcg 2 weeks ago by her PCP. -Continued same dose of levothyroxine.  Repeat TSH in 4 to 6 weeks outpatient.  Bilateral moderate pleural effusion: Left greater than right: -Hypoalbuminemia could be a contributing factor -Echo showed ejection fraction of 60 to 65% with grade 1 diastolic dysfunction.   -Repeat chest x-ray showed slight decrease in left pleural effusion.  Persistent dense opacity at the left lung base. -Patient remained on room air during hospitalization.  No respiratory distress reported.  No indication of thoracentesis at this time.  Plan discussed with the patient and she agreed with it.  Hypertension:  Remained stable on home meds hydralazine,  losartan  Hyperlipidemia: Continued Crestor  Hyponatremia: Patient remains asymptomatic.  Reviewed home medications.  Repeat BMP on follow-up visit.  Discharge Diagnoses:  Symptomatic anemia in the setting of tubulovillous adenomas with high-grade glandular dysplasia. Arrhythmias Severe protein calorie malnutrition Hypokalemia Hypomagnesemia Hypothyroidism Bilateral moderate pleural effusion Hypertension Hyperlipidemia Hyponatremia  Discharge Instructions  Discharge Instructions    Diet - low sodium heart healthy   Complete by: As directed    Discharge instructions   Complete by: As directed    Follow-up with PCP in 1 week. Repeat CBC and BMP on follow-up visit.  Monitor H&H very closely outpatient. Follow-up with colorectal surgery as scheduled Follow-up with GI outpatient Continue PT outpatient Follow-up with cardiology as scheduled   Increase activity slowly   Complete by: As directed      Allergies as of 09/19/2020      Reactions   Sulfa Antibiotics Hives   Carvedilol Rash      Medication List    TAKE these medications   alendronate 70 MG tablet Commonly known as: FOSAMAX Take 70 mg by mouth once a week.   aspirin 81 MG EC tablet Take 1 tablet (81 mg total) by mouth daily.   gabapentin 300 MG capsule Commonly known as: NEURONTIN Take 3 capsules (900 mg total) by mouth at bedtime. What changed: how much to take   hydrALAZINE 50 MG tablet Commonly known as: APRESOLINE TAKE 1 TABLET BY MOUTH 3  TIMES DAILY What changed: when to take this   levothyroxine 100 MCG tablet Commonly known as: SYNTHROID Take 100 mcg by mouth daily before breakfast.   losartan 100 MG tablet Commonly known as: COZAAR Take 1 tablet (100 mg total) by mouth daily. What changed: when to take this   metoprolol tartrate 25 MG tablet Commonly known as: LOPRESSOR Take 1 tablet (25 mg total) by mouth 2 (two) times daily.   multivitamin  with minerals Tabs tablet Take 1 tablet by mouth daily.   ondansetron 4 MG tablet Commonly known as: ZOFRAN Take 4 mg by mouth daily as needed for nausea or vomiting.   pantoprazole 40 MG tablet Commonly known as: PROTONIX Take 40 mg by mouth daily as needed (indigestion/acid reflux/stomach pain).   PreserVision AREDS Tabs Take 1 capsule by mouth 2 (two) times daily.   rosuvastatin 5 MG tablet Commonly known as: CRESTOR Take 5 mg by mouth every evening.       Follow-up Information    Jerline Pain, MD Follow up.   Specialty: Cardiology Why: Hospital follow-up scheduled for 10/10/2020 at 2:40pm. Please arrive 15 minutes early for check-in. If this date/time does not work for you, please call our office to reschedule. Contact information: [address] [place] [address] [phone]        [name], MD. Go on 09/21/2020.   Specialty: Family Medicine Why: Arrive at 10:45 for check in-  hospital follow up appointment- please wear mask to appointment Contact information: [place] [address] [phone]        Surgery, [place]. Go on 10/02/2020.   Specialty: General Surgery Why: arrive for check in at 10:50 for f/u appointment with Leighton Ruff MD- colorectal surgeon to discuss treatment options  Contact information: [address] [place] [address] [phone]              Allergies  Allergen Reactions  . Sulfa Antibiotics Hives  . Carvedilol Rash    Consultations:  GI  Cardiology   Procedures/Studies: DG Chest 2 View  Result Date: 09/15/2020 CLINICAL DATA:  Atrial fibrillation. EXAM: CHEST - 2 VIEW COMPARISON:  Mar 23, 2020 FINDINGS: Cardiomediastinal silhouette is normal. Mediastinal contours appear intact. There is no evidence of focal airspace consolidation, or pneumothorax. Bilateral moderate pleural effusions, left greater than right. Interval vertebroplasty of the lower  thoracic spine. Soft tissues are grossly normal. IMPRESSION: Bilateral moderate pleural effusions, left greater than right. Electronically Signed   By: Fidela Salisbury M.D.   On: 09/15/2020 15:53   CT ABDOMEN PELVIS W CONTRAST  Result Date: 09/17/2020 CLINICAL DATA:  85 year old with acute abdominal pain. Weight loss. Anemia. EXAM: CT ABDOMEN AND PELVIS WITH CONTRAST TECHNIQUE: Multidetector CT imaging of the abdomen and pelvis was performed using the standard protocol following bolus administration of intravenous contrast. CONTRAST:  18mL OMNIPAQUE IOHEXOL 300 MG/ML  SOLN COMPARISON:  Abdominal CT 05/10/2020.  Chest radiograph yesterday. FINDINGS: Lower chest: Moderate bilateral pleural effusions with compressive atelectasis. Upper normal heart size. Small to moderate hiatal hernia. Hepatobiliary: Stable cysts in the right and left lobe of the liver. No new hepatic lesion. Partially distended gallbladder. No calcified gallstone. There is a Phrygian cap. No biliary dilatation. Pancreas: No ductal dilatation or inflammation. There is a 9 mm cyst in the pancreatic body, series 3, image 30, it was not definitively seen on prior. Spleen: Calcified granuloma.  No suspicious lesion.  Normal in size. Adrenals/Urinary Tract: Normal adrenal glands. No hydronephrosis or perinephric edema. Homogeneous renal enhancement with symmetric excretion on delayed phase imaging. Lower pole left renal cyst again seen. There is also a low-density lesion in the lower most left kidney that is too small to characterize. Urinary bladder is physiologically distended without wall thickening. Stomach/Bowel: Small to moderate hiatal hernia normal positioning of the duodenum and ligament of Treitz. There is a moderate length segment of low densities small bowel wall thickening and edema involving the pelvic bowel loops extending to the distal ileum. No obstruction, administered enteric contrast reaches the colon. Normal appendix filled  with enteric contrast. Mild colonic tortuosity. Multifocal colonic diverticulosis. Mild mural hypertrophy of the sigmoid but no focally inflamed colonic wall thickening or pericolonic edema. Vascular/Lymphatic: Moderate aortic atherosclerosis. No aortic aneurysm. Mesenteric branch vessels are patent, no evidence of mesenteric thrombus or embolic disease. No enlarged lymph nodes in the abdomen or pelvis. Reproductive: Low-density endometrium measuring 13 mm, endometrial thickening versus fluid in the endometrial canal, also seen on prior exam. Enhancing focus involving the left uterine fundus likely a fibroid. There is no adnexal mass. Other: Small to moderate volume of ascites/free fluid in the abdomen and pelvis, including the mesentery. Mild mesenteric edema in the region of abnormal small bowel. No free air or focal fluid collection. Musculoskeletal: T11 compression fracture with vertebral augmentation, new from prior exam. 2 screws traverse the right femoral neck. No acute osseous abnormalities. IMPRESSION: 1. Moderate length segment of low-density small bowel wall thickening and edema involving the pelvic bowel loops extending to the distal ileum. Differential considerations include infectious or inflammatory enteritis or bowel angioedema. Small-to-moderate volume of ascites/free fluid in the abdomen and pelvis. No perforation or abscess. 2. Moderate bilateral pleural effusions with compressive atelectasis. 3. Low-density endometrium measuring 13 mm, endometrial thickening versus fluid in the endometrial canal, also seen on prior exam. This is abnormal in a postmenopausal patient. Recommend nonemergent pelvic ultrasound for characterization on an elective outpatient basis after resolution of acute event. 4. Colonic diverticulosis without diverticulitis. 5. Small to moderate hiatal  hernia. 6. A 9 mm cyst in the pancreatic body was not definitively seen on prior exam. Recommend follow up pre and post contrast  MRI/MRCP or pancreatic protocol CT in 2 years. This recommendation follows ACR consensus guidelines: Management of Incidental Pancreatic Cysts: A White Paper of the ACR Incidental Findings Committee. [place] 9562;[id number]. Aortic Atherosclerosis (ICD10-I70.0). Electronically Signed   By: Keith Rake M.D.   On: 09/17/2020 22:57   Portable chest 1 View  Result Date: 09/16/2020 CLINICAL DATA:  Pleural effusion. EXAM: PORTABLE CHEST 1 VIEW COMPARISON:  09/15/2020 FINDINGS: Heart is enlarged. Lungs are hyperinflated. LEFT pleural effusion appears slightly smaller. RIGHT pleural effusion appears stable. There is persistent dense opacity at the LEFT lung base which obscures the LEFT hemidiaphragm. There is no pneumothorax. RIGHT lung is clear. IMPRESSION: 1. Slight decrease in LEFT pleural effusion. 2. Persistent dense opacity at the LEFT lung base. Electronically Signed   By: Nolon Nations M.D.   On: 09/16/2020 17:29   ECHOCARDIOGRAM COMPLETE  Result Date: 09/16/2020    ECHOCARDIOGRAM REPORT   Patient Name:   Tammy Allison Date of Exam: 09/16/2020 Medical Rec #:  [id number]         Height:       64.0 in Accession #:    [id number]        Weight:       115.0 lb Date of Birth:  December 29, 1934          BSA:          1.546 m Patient Age:    30 years          BP:           133/64 mmHg Patient Gender: F                 HR:           81 bpm. Exam Location:  Inpatient Procedure: 2D Echo, Cardiac Doppler and Color Doppler Indications:    Abnormal ECG  History:        Patient has prior history of Echocardiogram examinations, most                 recent 04/25/2019. Risk Factors:Hypertension and Dyslipidemia.                 GERD. H/O TIA.  Sonographer:    Clayton Lefort RDCS (AE) Referring Phys: [id number] ASIA B [place]  1. Left ventricular ejection fraction, by estimation, is 60 to 65%. The left ventricle has normal function. The left ventricle has no regional wall motion abnormalities. Left  ventricular diastolic parameters are consistent with Grade I diastolic dysfunction (impaired relaxation).  2. Right ventricular systolic function is normal. The right ventricular size is normal. There is mildly elevated pulmonary artery systolic pressure. The estimated right ventricular systolic pressure is 40.3 mmHg.  3. Left atrial size was mildly dilated.  4. Large pleural effusion in the left lateral region.  5. The mitral valve is degenerative. Mild mitral valve regurgitation. No evidence of mitral stenosis.  6. The aortic valve is normal in structure. Aortic valve regurgitation is trivial. No aortic stenosis is present.  7. The inferior vena cava is normal in size with greater than 50% respiratory variability, suggesting right atrial pressure of 3 mmHg. FINDINGS  Left Ventricle: Left ventricular ejection fraction, by estimation, is 60 to 65%. The left ventricle has normal function. The left ventricle has no regional wall motion abnormalities. The left ventricular internal  cavity size was normal in size. There is  no left ventricular hypertrophy. Left ventricular diastolic parameters are consistent with Grade I diastolic dysfunction (impaired relaxation). Normal left ventricular filling pressure. Right Ventricle: The right ventricular size is normal. No increase in right ventricular wall thickness. Right ventricular systolic function is normal. There is mildly elevated pulmonary artery systolic pressure. The tricuspid regurgitant velocity is 2.85  m/s, and with an assumed right atrial pressure of 3 mmHg, the estimated right ventricular systolic pressure is 63.8 mmHg. Left Atrium: Left atrial size was mildly dilated. Right Atrium: Right atrial size was normal in size. Pericardium: There is no evidence of pericardial effusion. Mitral Valve: The mitral valve is degenerative in appearance. There is moderate thickening of the mitral valve leaflet(s). Mild mitral annular calcification. Mild mitral valve  regurgitation. No evidence of mitral valve stenosis. Tricuspid Valve: The tricuspid valve is normal in structure. Tricuspid valve regurgitation is trivial. No evidence of tricuspid stenosis. Aortic Valve: The aortic valve is normal in structure. Aortic valve regurgitation is trivial. No aortic stenosis is present. Aortic valve mean gradient measures 4.0 mmHg. Aortic valve peak gradient measures 6.9 mmHg. Aortic valve area, by VTI measures 2.67 cm. Pulmonic Valve: The pulmonic valve was normal in structure. Pulmonic valve regurgitation is not visualized. No evidence of pulmonic stenosis. Aorta: The aortic root is normal in size and structure. Venous: The inferior vena cava is normal in size with greater than 50% respiratory variability, suggesting right atrial pressure of 3 mmHg. IAS/Shunts: The interatrial septum appears to be lipomatous. No atrial level shunt detected by color flow Doppler. Additional Comments: There is a large pleural effusion in the left lateral region.  LEFT VENTRICLE PLAX 2D LVIDd:         3.90 cm  Diastology LVIDs:         3.00 cm  LV e' medial:    7.62 cm/s LV PW:         1.00 cm  LV E/e' medial:  12.7 LV IVS:        1.00 cm  LV e' lateral:   9.03 cm/s LVOT diam:     1.90 cm  LV E/e' lateral: 10.7 LV SV:         75 LV SV Index:   49 LVOT Area:     2.84 cm  RIGHT VENTRICLE             IVC RV Basal diam:  3.30 cm     IVC diam: 1.80 cm RV S prime:     14.10 cm/s TAPSE (M-mode): 1.8 cm LEFT ATRIUM             Index       RIGHT ATRIUM           Index LA diam:        3.20 cm 2.07 cm/m  RA Area:     15.50 cm LA Vol (A2C):   62.8 ml 40.61 ml/m RA Volume:   35.50 ml  22.96 ml/m LA Vol (A4C):   57.1 ml 36.93 ml/m LA Biplane Vol: 62.9 ml 40.68 ml/m  AORTIC VALVE AV Area (Vmax):    2.51 cm AV Area (Vmean):   2.52 cm AV Area (VTI):     2.67 cm AV Vmax:           131.00 cm/s AV Vmean:          93.300 cm/s AV VTI:  0.281 m AV Peak Grad:      6.9 mmHg AV Mean Grad:      4.0 mmHg LVOT  Vmax:         116.00 cm/s LVOT Vmean:        82.900 cm/s LVOT VTI:          0.265 m LVOT/AV VTI ratio: 0.94  AORTA Ao Root diam: 3.30 cm MITRAL VALVE                TRICUSPID VALVE MV Area (PHT): 2.50 cm     TR Peak grad:   32.5 mmHg MV Decel Time: 304 msec     TR Vmax:        285.00 cm/s MV E velocity: 96.70 cm/s MV A velocity: 133.00 cm/s  SHUNTS MV E/A ratio:  0.73         Systemic VTI:  0.26 m                             Systemic Diam: 1.90 cm Fransico Him MD Electronically signed by Fransico Him MD Signature Date/Time: 09/16/2020/2:35:03 PM    Final       Subjective: Patient seen and examined this morning.  Tells me that she feels much better.  Denies chest pain, shortness of breath, palpitation, melena or hematemesis.  Eager to go home today.  She would like to continue outpatient PT after discharge.  Discharge Exam: Vitals:   09/19/20 1000 09/19/20 1100  BP: (!) 139/58 (!) 135/57  Pulse:  99  Resp:  20  Temp: 98.7 F (37.1 C) 97.6 F (36.4 C)  SpO2:  99%   Vitals:   09/19/20 0608 09/19/20 0757 09/19/20 1000 09/19/20 1100  BP: (!) 120/55 (!) 155/70 (!) 139/58 (!) 135/57  Pulse: 82 85  99  Resp: 16 20  20   Temp: 98.2 F (36.8 C) 98 F (36.7 C) 98.7 F (37.1 C) 97.6 F (36.4 C)  TempSrc: Oral Oral Oral Oral  SpO2: 96% 97%  99%  Weight:      Height:        General: Pt is alert, awake, not in acute distress Cardiovascular: RRR, S1/S2 +, no rubs, no gallops Respiratory: CTA bilaterally, no wheezing, no rhonchi Abdominal: Soft, NT, ND, bowel sounds + Extremities: no edema, no cyanosis    The results of significant diagnostics from this hospitalization (including imaging, microbiology, ancillary and laboratory) are listed below for reference.     Microbiology: Recent Results (from the past 240 hour(s))  Urine culture     Status: Abnormal   Collection Time: 09/15/20  4:52 PM   Specimen: Urine, Random  Result Value Ref Range Status   Specimen Description URINE,  RANDOM  Final   Special Requests   Final    NONE Performed at [facility], [address] [place], [place] [address]    Culture >=100,000 COLONIES/mL ESCHERICHIA COLI (A)  Final   Report Status 09/18/2020 FINAL  Final   Organism ID, Bacteria ESCHERICHIA COLI (A)  Final      Susceptibility   Escherichia coli - MIC*    AMPICILLIN <=2 SENSITIVE Sensitive     CEFAZOLIN <=4 SENSITIVE Sensitive     CEFEPIME <=0.12 SENSITIVE Sensitive     CEFTRIAXONE <=0.25 SENSITIVE Sensitive     CIPROFLOXACIN <=0.25 SENSITIVE Sensitive     GENTAMICIN <=1 SENSITIVE Sensitive     IMIPENEM <=0.25 SENSITIVE Sensitive  NITROFURANTOIN <=16 SENSITIVE Sensitive     TRIMETH/SULFA <=20 SENSITIVE Sensitive     AMPICILLIN/SULBACTAM <=2 SENSITIVE Sensitive     PIP/TAZO <=4 SENSITIVE Sensitive     * >=100,000 COLONIES/mL ESCHERICHIA COLI  Respiratory Panel by RT PCR (Flu A&B, Covid) - Nasopharyngeal Swab     Status: None   Collection Time: 09/15/20  7:58 PM   Specimen: Nasopharyngeal Swab  Result Value Ref Range Status   SARS Coronavirus 2 by RT PCR NEGATIVE NEGATIVE Final    Comment: (NOTE) SARS-CoV-2 target nucleic acids are NOT DETECTED.  The SARS-CoV-2 RNA is generally detectable in upper respiratoy specimens during the acute phase of infection. The lowest concentration of SARS-CoV-2 viral copies this assay can detect is 131 copies/mL. A negative result does not preclude SARS-Cov-2 infection and should not be used as the sole basis for treatment or other patient management decisions. A negative result may occur with  improper specimen collection/handling, submission of specimen other than nasopharyngeal swab, presence of viral mutation(s) within the areas targeted by this assay, and inadequate number of viral copies (<131 copies/mL). A negative result must be combined with clinical observations, patient history, and epidemiological information. The expected result is Negative.  Fact Sheet for  Patients:  PinkCheek.be  Fact Sheet for Healthcare Providers:  GravelBags.it  This test is no t yet approved or cleared by the [place] FDA and  has been authorized for detection and/or diagnosis of SARS-CoV-2 by FDA under an Emergency Use Authorization (EUA). This EUA will remain  in effect (meaning this test can be used) for the duration of the COVID-19 declaration under Section 564(b)(1) of the Act, 21 U.S.C. section 360bbb-3(b)(1), unless the authorization is terminated or revoked sooner.     Influenza A by PCR NEGATIVE NEGATIVE Final   Influenza B by PCR NEGATIVE NEGATIVE Final    Comment: (NOTE) The Xpert Xpress SARS-CoV-2/FLU/RSV assay is intended as an aid in  the diagnosis of influenza from Nasopharyngeal swab specimens and  should not be used as a sole basis for treatment. Nasal washings and  aspirates are unacceptable for Xpert Xpress SARS-CoV-2/FLU/RSV  testing.  Fact Sheet for Patients: PinkCheek.be  Fact Sheet for Healthcare Providers: GravelBags.it  This test is not yet approved or cleared by the [place] FDA and  has been authorized for detection and/or diagnosis of SARS-CoV-2 by  FDA under an Emergency Use Authorization (EUA). This EUA will remain  in effect (meaning this test can be used) for the duration of the  Covid-19 declaration under Section 564(b)(1) of the Act, 21  U.S.C. section 360bbb-3(b)(1), unless the authorization is  terminated or revoked. Performed at [facility], [place] [address], [place], [place] [address]      Labs: BNP (last 3 results) Recent Labs    09/15/20 1650  BNP 270.6*   Basic Metabolic Panel: Recent Labs  Lab 09/15/20 1525 09/15/20 1929 09/16/20 0500 09/17/20 0152 09/18/20 0145 09/19/20 0121  NA 133*  --  135 131*  --  127*  K 3.3*  --  4.0 3.7  --  3.7  CL 99  --  103 101  --  98   CO2 26  --  25 24  --  22  GLUCOSE 111*  --  101* 103*  --  103*  BUN 15  --  14 13  --  19  CREATININE 0.88  --  0.94 0.83  --  0.99  CALCIUM 7.8*  --  7.7* 7.4*  --  7.3*  MG  --  1.7 1.7 1.6* 2.2  --    Liver Function Tests: Recent Labs  Lab 09/15/20 1525 09/16/20 0500 09/17/20 0152  AST 19 19 19   ALT 13 13 12   ALKPHOS 58 47 50  BILITOT 0.5 0.6 0.8  PROT 5.4* 4.7* 4.7*  ALBUMIN 2.1* 1.8* 1.8*   Recent Labs  Lab 09/15/20 1525  LIPASE 51   No results for input(s): AMMONIA in the last 168 hours. CBC: Recent Labs  Lab 09/15/20 1525 09/16/20 0500 09/17/20 0152 09/18/20 0145 09/19/20 0121  WBC 9.3 9.2 11.1* 13.1* 8.9  NEUTROABS 7.3 7.3  --   --   --   HGB 10.3* 9.5* 9.4* 9.5* 8.7*  HCT 32.0* 29.1* 28.9* 28.9* 26.5*  MCV 95.5 96.4 95.7 93.5 94.0  PLT 313 279 281 296 285   Cardiac Enzymes: No results for input(s): CKTOTAL, CKMB, CKMBINDEX, TROPONINI in the last 168 hours. BNP: Invalid input(s): POCBNP CBG: No results for input(s): GLUCAP in the last 168 hours. D-Dimer No results for input(s): DDIMER in the last 72 hours. Hgb A1c No results for input(s): HGBA1C in the last 72 hours. Lipid Profile No results for input(s): CHOL, HDL, LDLCALC, TRIG, CHOLHDL, LDLDIRECT in the last 72 hours. Thyroid function studies No results for input(s): TSH, T4TOTAL, T3FREE, THYROIDAB in the last 72 hours.  Invalid input(s): FREET3 Anemia work up No results for input(s): VITAMINB12, FOLATE, FERRITIN, TIBC, IRON, RETICCTPCT in the last 72 hours. Urinalysis    Component Value Date/Time   COLORURINE YELLOW 09/15/2020 1652   APPEARANCEUR CLOUDY (A) 09/15/2020 1652   LABSPEC 1.011 09/15/2020 1652   PHURINE 8.0 09/15/2020 1652   GLUCOSEU NEGATIVE 09/15/2020 1652   HGBUR LARGE (A) 09/15/2020 1652   BILIRUBINUR NEGATIVE 09/15/2020 1652   KETONESUR NEGATIVE 09/15/2020 1652   PROTEINUR 30 (A) 09/15/2020 1652   UROBILINOGEN 0.2 05/29/2014 1122   NITRITE NEGATIVE 09/15/2020 1652    LEUKOCYTESUR NEGATIVE 09/15/2020 1652   Sepsis Labs Invalid input(s): PROCALCITONIN,  WBC,  LACTICIDVEN Microbiology Recent Results (from the past 240 hour(s))  Urine culture     Status: Abnormal   Collection Time: 09/15/20  4:52 PM   Specimen: Urine, Random  Result Value Ref Range Status   Specimen Description URINE, RANDOM  Final   Special Requests   Final    NONE Performed at [facility], [place] [address], [place], [place] [address]    Culture >=100,000 COLONIES/mL ESCHERICHIA COLI (A)  Final   Report Status 09/18/2020 FINAL  Final   Organism ID, Bacteria ESCHERICHIA COLI (A)  Final      Susceptibility   Escherichia coli - MIC*    AMPICILLIN <=2 SENSITIVE Sensitive     CEFAZOLIN <=4 SENSITIVE Sensitive     CEFEPIME <=0.12 SENSITIVE Sensitive     CEFTRIAXONE <=0.25 SENSITIVE Sensitive     CIPROFLOXACIN <=0.25 SENSITIVE Sensitive     GENTAMICIN <=1 SENSITIVE Sensitive     IMIPENEM <=0.25 SENSITIVE Sensitive     NITROFURANTOIN <=16 SENSITIVE Sensitive     TRIMETH/SULFA <=20 SENSITIVE Sensitive     AMPICILLIN/SULBACTAM <=2 SENSITIVE Sensitive     PIP/TAZO <=4 SENSITIVE Sensitive     * >=100,000 COLONIES/mL ESCHERICHIA COLI  Respiratory Panel by RT PCR (Flu A&B, Covid) - Nasopharyngeal Swab     Status: None   Collection Time: 09/15/20  7:58 PM   Specimen: Nasopharyngeal Swab  Result Value Ref Range Status   SARS Coronavirus 2 by RT PCR NEGATIVE NEGATIVE  Final    Comment: (NOTE) SARS-CoV-2 target nucleic acids are NOT DETECTED.  The SARS-CoV-2 RNA is generally detectable in upper respiratoy specimens during the acute phase of infection. The lowest concentration of SARS-CoV-2 viral copies this assay can detect is 131 copies/mL. A negative result does not preclude SARS-Cov-2 infection and should not be used as the sole basis for treatment or other patient management decisions. A negative result may occur with  improper specimen collection/handling, submission of  specimen other than nasopharyngeal swab, presence of viral mutation(s) within the areas targeted by this assay, and inadequate number of viral copies (<131 copies/mL). A negative result must be combined with clinical observations, patient history, and epidemiological information. The expected result is Negative.  Fact Sheet for Patients:  PinkCheek.be  Fact Sheet for Healthcare Providers:  GravelBags.it  This test is no t yet approved or cleared by the [place] FDA and  has been authorized for detection and/or diagnosis of SARS-CoV-2 by FDA under an Emergency Use Authorization (EUA). This EUA will remain  in effect (meaning this test can be used) for the duration of the COVID-19 declaration under Section 564(b)(1) of the Act, 21 U.S.C. section 360bbb-3(b)(1), unless the authorization is terminated or revoked sooner.     Influenza A by PCR NEGATIVE NEGATIVE Final   Influenza B by PCR NEGATIVE NEGATIVE Final    Comment: (NOTE) The Xpert Xpress SARS-CoV-2/FLU/RSV assay is intended as an aid in  the diagnosis of influenza from Nasopharyngeal swab specimens and  should not be used as a sole basis for treatment. Nasal washings and  aspirates are unacceptable for Xpert Xpress SARS-CoV-2/FLU/RSV  testing.  Fact Sheet for Patients: PinkCheek.be  Fact Sheet for Healthcare Providers: GravelBags.it  This test is not yet approved or cleared by the [place] FDA and  has been authorized for detection and/or diagnosis of SARS-CoV-2 by  FDA under an Emergency Use Authorization (EUA). This EUA will remain  in effect (meaning this test can be used) for the duration of the  Covid-19 declaration under Section 564(b)(1) of the Act, 21  U.S.C. section 360bbb-3(b)(1), unless the authorization is  terminated or revoked. Performed at [facility], [place] [address]  [place], [place] [address]      Time coordinating discharge: Over 30 minutes  SIGNED:   Mckinley Jewel, MD  [facility] 09/19/2020, 12:02 PM Pager   If 7PM-7AM, please contact night-coverage [email or web address]

## 2020-09-19 NOTE — Progress Notes (Signed)
Discharge instructions given to patient. IV removed, clean and intact. Medications reviewed, all questions answered. Pt escorted home with daughter.  Arletta Bale, RN

## 2020-09-19 NOTE — Progress Notes (Signed)
Primary Cardiologist:  Marlou Porch  Subjective:  ? Going home today Does not want surgery GI suggests outpatient f/u with colorectal surgeon   Objective:  Vitals:   09/18/20 2336 09/19/20 0500 09/19/20 0608 09/19/20 0757  BP: (!) 136/51  (!) 120/55 (!) 155/70  Pulse: 78  82 85  Resp: 17  16 20   Temp: 98 F (36.7 C)  98.2 F (36.8 C) 98 F (36.7 C)  TempSrc: Oral  Oral Oral  SpO2: 96%  96% 97%  Weight:  56 kg    Height:        Intake/Output from previous day: No intake or output data in the 24 hours ending 09/19/20 3810  Physical Exam: Affect appropriate Frail elderly female  HEENT: normal Neck supple with no adenopathy JVP normal no bruits no thyromegaly Lungs no wheezing decreased BS bases  Heart:  S1/S2 no murmur, no rub, gallop or click PMI normal Abdomen: benighn, BS positve, no tenderness, no AAA no bruit.  No HSM or HJR Distal pulses intact with no bruits No edema Neuro non-focal Skin warm and dry No muscular weakness   Lab Results: Basic Metabolic Panel: Recent Labs    09/17/20 0152 09/18/20 0145 09/19/20 0121  NA 131*  --  127*  K 3.7  --  3.7  CL 101  --  98  CO2 24  --  22  GLUCOSE 103*  --  103*  BUN 13  --  19  CREATININE 0.83  --  0.99  CALCIUM 7.4*  --  7.3*  MG 1.6* 2.2  --    Liver Function Tests: Recent Labs    09/17/20 0152  AST 19  ALT 12  ALKPHOS 50  BILITOT 0.8  PROT 4.7*  ALBUMIN 1.8*   No results for input(s): LIPASE, AMYLASE in the last 72 hours. CBC: Recent Labs    09/18/20 0145 09/19/20 0121  WBC 13.1* 8.9  HGB 9.5* 8.7*  HCT 28.9* 26.5*  MCV 93.5 94.0  PLT 296 285   Thyroid Function Tests: No results for input(s): TSH, T4TOTAL, T3FREE, THYROIDAB in the last 72 hours.  Invalid input(s): FREET3 Anemia Panel: No results for input(s): VITAMINB12, FOLATE, FERRITIN, TIBC, IRON, RETICCTPCT in the last 72 hours.  Imaging: CT ABDOMEN PELVIS W CONTRAST  Result Date: 09/17/2020 CLINICAL DATA:  85 year old  with acute abdominal pain. Weight loss. Anemia. EXAM: CT ABDOMEN AND PELVIS WITH CONTRAST TECHNIQUE: Multidetector CT imaging of the abdomen and pelvis was performed using the standard protocol following bolus administration of intravenous contrast. CONTRAST:  147mL OMNIPAQUE IOHEXOL 300 MG/ML  SOLN COMPARISON:  Abdominal CT 05/10/2020.  Chest radiograph yesterday. FINDINGS: Lower chest: Moderate bilateral pleural effusions with compressive atelectasis. Upper normal heart size. Small to moderate hiatal hernia. Hepatobiliary: Stable cysts in the right and left lobe of the liver. No new hepatic lesion. Partially distended gallbladder. No calcified gallstone. There is a Phrygian cap. No biliary dilatation. Pancreas: No ductal dilatation or inflammation. There is a 9 mm cyst in the pancreatic body, series 3, image 30, it was not definitively seen on prior. Spleen: Calcified granuloma.  No suspicious lesion.  Normal in size. Adrenals/Urinary Tract: Normal adrenal glands. No hydronephrosis or perinephric edema. Homogeneous renal enhancement with symmetric excretion on delayed phase imaging. Lower pole left renal cyst again seen. There is also a low-density lesion in the lower most left kidney that is too small to characterize. Urinary bladder is physiologically distended without wall thickening. Stomach/Bowel: Small to moderate hiatal hernia normal  positioning of the duodenum and ligament of Treitz. There is a moderate length segment of low densities small bowel wall thickening and edema involving the pelvic bowel loops extending to the distal ileum. No obstruction, administered enteric contrast reaches the colon. Normal appendix filled with enteric contrast. Mild colonic tortuosity. Multifocal colonic diverticulosis. Mild mural hypertrophy of the sigmoid but no focally inflamed colonic wall thickening or pericolonic edema. Vascular/Lymphatic: Moderate aortic atherosclerosis. No aortic aneurysm. Mesenteric branch vessels  are patent, no evidence of mesenteric thrombus or embolic disease. No enlarged lymph nodes in the abdomen or pelvis. Reproductive: Low-density endometrium measuring 13 mm, endometrial thickening versus fluid in the endometrial canal, also seen on prior exam. Enhancing focus involving the left uterine fundus likely a fibroid. There is no adnexal mass. Other: Small to moderate volume of ascites/free fluid in the abdomen and pelvis, including the mesentery. Mild mesenteric edema in the region of abnormal small bowel. No free air or focal fluid collection. Musculoskeletal: T11 compression fracture with vertebral augmentation, new from prior exam. 2 screws traverse the right femoral neck. No acute osseous abnormalities. IMPRESSION: 1. Moderate length segment of low-density small bowel wall thickening and edema involving the pelvic bowel loops extending to the distal ileum. Differential considerations include infectious or inflammatory enteritis or bowel angioedema. Small-to-moderate volume of ascites/free fluid in the abdomen and pelvis. No perforation or abscess. 2. Moderate bilateral pleural effusions with compressive atelectasis. 3. Low-density endometrium measuring 13 mm, endometrial thickening versus fluid in the endometrial canal, also seen on prior exam. This is abnormal in a postmenopausal patient. Recommend nonemergent pelvic ultrasound for characterization on an elective outpatient basis after resolution of acute event. 4. Colonic diverticulosis without diverticulitis. 5. Small to moderate hiatal hernia. 6. A 9 mm cyst in the pancreatic body was not definitively seen on prior exam. Recommend follow up pre and post contrast MRI/MRCP or pancreatic protocol CT in 2 years. This recommendation follows ACR consensus guidelines: Management of Incidental Pancreatic Cysts: A White Paper of the ACR Incidental Findings Committee. Stanley [id number]. Aortic Atherosclerosis (ICD10-I70.0). Electronically Signed    By: Keith Rake M.D.   On: 09/17/2020 22:57    Cardiac Studies:  ECG: SR PAC;s no acute ST changes    Telemetry: NSR PAC;s   Echo: 09/16/20 EF 60-65%  Mild MR mild LAE   Medications:   . enoxaparin (LOVENOX) injection  40 mg Subcutaneous Q24H  . feeding supplement  1 Container Oral TID BM  . gabapentin  300 mg Oral QHS  . hydrALAZINE  50 mg Oral BID  . levothyroxine  100 mcg Oral QAC breakfast  . losartan  100 mg Oral QPM  . metoprolol tartrate  25 mg Oral BID  . multivitamin with minerals  1 tablet Oral Daily  . pantoprazole  40 mg Oral Daily  . rosuvastatin  5 mg Oral QHS  . thiamine  100 mg Oral Daily       Assessment/Plan:   1. Arrhythmia:  Not PAF more SVT ? Related to COVID vaccine continue low dose lopressor stable  2. Thyroid: continue synthroid replacement  3. HTN:  Well controlled.  Continue current medications and low sodium Dash type diet.    4. Failure to thrive: weight loss and anemia. Lots of polyps dysplastic on path needs colectomy Outpatient f/u colorectal surgery   Cardiology will sign off  Will arrange outpatient f/u Dr Gayla Medicus [facility] 09/19/2020, 9:39 AM

## 2020-09-30 ENCOUNTER — Other Ambulatory Visit: Payer: Self-pay

## 2020-09-30 ENCOUNTER — Observation Stay (HOSPITAL_COMMUNITY)
Admission: EM | Admit: 2020-09-30 | Discharge: 2020-10-01 | Disposition: A | Discharge status: Home / Self Care | Payer: Medicare Other | Attending: Internal Medicine | Admitting: Internal Medicine

## 2020-09-30 ENCOUNTER — Emergency Department (HOSPITAL_COMMUNITY): Payer: Medicare Other

## 2020-09-30 ENCOUNTER — Encounter (HOSPITAL_COMMUNITY): Payer: Self-pay

## 2020-09-30 DIAGNOSIS — I10 Essential (primary) hypertension: Secondary | ICD-10-CM | POA: Diagnosis present

## 2020-09-30 DIAGNOSIS — Z20822 Contact with and (suspected) exposure to covid-19: Secondary | ICD-10-CM | POA: Diagnosis not present

## 2020-09-30 DIAGNOSIS — Z79899 Other long term (current) drug therapy: Secondary | ICD-10-CM | POA: Insufficient documentation

## 2020-09-30 DIAGNOSIS — K922 Gastrointestinal hemorrhage, unspecified: Secondary | ICD-10-CM | POA: Diagnosis not present

## 2020-09-30 DIAGNOSIS — Z7982 Long term (current) use of aspirin: Secondary | ICD-10-CM | POA: Diagnosis not present

## 2020-09-30 DIAGNOSIS — E876 Hypokalemia: Secondary | ICD-10-CM

## 2020-09-30 DIAGNOSIS — R627 Adult failure to thrive: Secondary | ICD-10-CM | POA: Diagnosis present

## 2020-09-30 DIAGNOSIS — E039 Hypothyroidism, unspecified: Secondary | ICD-10-CM | POA: Diagnosis present

## 2020-09-30 DIAGNOSIS — E875 Hyperkalemia: Secondary | ICD-10-CM

## 2020-09-30 DIAGNOSIS — Z8673 Personal history of transient ischemic attack (TIA), and cerebral infarction without residual deficits: Secondary | ICD-10-CM | POA: Insufficient documentation

## 2020-09-30 DIAGNOSIS — I669 Occlusion and stenosis of unspecified cerebral artery: Secondary | ICD-10-CM | POA: Diagnosis present

## 2020-09-30 LAB — BASIC METABOLIC PANEL
Anion gap: 7 (ref 5–15)
BUN: 19 mg/dL (ref 8–23)
CO2: 21 mmol/L — ABNORMAL LOW (ref 22–32)
Calcium: 6.8 mg/dL — ABNORMAL LOW (ref 8.9–10.3)
Chloride: 104 mmol/L (ref 98–111)
Creatinine, Ser: 1.09 mg/dL — ABNORMAL HIGH (ref 0.44–1.00)
GFR, Estimated: 50 mL/min — ABNORMAL LOW (ref >60)
Glucose, Bld: 121 mg/dL — ABNORMAL HIGH (ref 70–99)
Potassium: 4.1 mmol/L (ref 3.5–5.1)
Sodium: 132 mmol/L — ABNORMAL LOW (ref 135–145)

## 2020-09-30 LAB — CBC
HCT: 26 % — ABNORMAL LOW (ref 36.0–46.0)
Hemoglobin: 8.6 g/dL — ABNORMAL LOW (ref 12.0–15.0)
MCH: 31.2 pg (ref 26.0–34.0)
MCHC: 33.1 g/dL (ref 30.0–36.0)
MCV: 94.2 fL (ref 80.0–100.0)
Platelets: 300 10*3/uL (ref 150–400)
RBC: 2.76 MIL/uL — ABNORMAL LOW (ref 3.87–5.11)
RDW: 14.6 % (ref 11.5–15.5)
WBC: 10 10*3/uL (ref 4.0–10.5)
nRBC: 0 % (ref 0.0–0.2)

## 2020-09-30 LAB — TYPE AND SCREEN
ABO/RH(D): O POS
Antibody Screen: NEGATIVE

## 2020-09-30 LAB — COMPREHENSIVE METABOLIC PANEL
ALT: 12 U/L (ref 0–44)
AST: 17 U/L (ref 15–41)
Albumin: 1.9 g/dL — ABNORMAL LOW (ref 3.5–5.0)
Alkaline Phosphatase: 51 U/L (ref 38–126)
Anion gap: 10 (ref 5–15)
BUN: 24 mg/dL — ABNORMAL HIGH (ref 8–23)
CO2: 24 mmol/L (ref 22–32)
Calcium: 7.3 mg/dL — ABNORMAL LOW (ref 8.9–10.3)
Chloride: 99 mmol/L (ref 98–111)
Creatinine, Ser: 1.12 mg/dL — ABNORMAL HIGH (ref 0.44–1.00)
GFR, Estimated: 49 mL/min — ABNORMAL LOW (ref >60)
Glucose, Bld: 102 mg/dL — ABNORMAL HIGH (ref 70–99)
Potassium: 2.7 mmol/L — CL (ref 3.5–5.1)
Sodium: 133 mmol/L — ABNORMAL LOW (ref 135–145)
Total Bilirubin: 0.7 mg/dL (ref 0.3–1.2)
Total Protein: 4.6 g/dL — ABNORMAL LOW (ref 6.5–8.1)

## 2020-09-30 LAB — MAGNESIUM: Magnesium: 1.4 mg/dL — ABNORMAL LOW (ref 1.7–2.4)

## 2020-09-30 LAB — I-STAT CHEM 8, ED
BUN: 23 mg/dL (ref 8–23)
Calcium, Ion: 0.94 mmol/L — ABNORMAL LOW (ref 1.15–1.40)
Chloride: 97 mmol/L — ABNORMAL LOW (ref 98–111)
Creatinine, Ser: 1.1 mg/dL — ABNORMAL HIGH (ref 0.44–1.00)
Glucose, Bld: 98 mg/dL (ref 70–99)
HCT: 27 % — ABNORMAL LOW (ref 36.0–46.0)
Hemoglobin: 9.2 g/dL — ABNORMAL LOW (ref 12.0–15.0)
Potassium: 2.6 mmol/L — CL (ref 3.5–5.1)
Sodium: 134 mmol/L — ABNORMAL LOW (ref 135–145)
TCO2: 22 mmol/L (ref 22–32)

## 2020-09-30 LAB — RESPIRATORY PANEL BY RT PCR (FLU A&B, COVID)
Influenza A by PCR: NEGATIVE
Influenza B by PCR: NEGATIVE
SARS Coronavirus 2 by RT PCR: NEGATIVE

## 2020-09-30 LAB — HEMOGLOBIN AND HEMATOCRIT, BLOOD
HCT: 26 % — ABNORMAL LOW (ref 36.0–46.0)
Hemoglobin: 8.3 g/dL — ABNORMAL LOW (ref 12.0–15.0)

## 2020-09-30 LAB — POC OCCULT BLOOD, ED: Fecal Occult Bld: POSITIVE — AB

## 2020-09-30 MED ORDER — ONDANSETRON HCL 4 MG/2ML IJ SOLN: 4.0000 mg | Freq: Four times a day (QID) | INTRAMUSCULAR | Status: DC | PRN

## 2020-09-30 MED ORDER — LEVOTHYROXINE SODIUM 100 MCG PO TABS
100.0000 ug | ORAL_TABLET | Freq: Every day | ORAL | Status: DC
  Administered 2020-10-01: 100 ug via ORAL
  Filled 2020-09-30: qty 1

## 2020-09-30 MED ORDER — ROSUVASTATIN CALCIUM 5 MG PO TABS
5.0000 mg | ORAL_TABLET | Freq: Every evening | ORAL | Status: DC
  Administered 2020-09-30: 5 mg via ORAL
  Filled 2020-09-30: qty 1

## 2020-09-30 MED ORDER — SODIUM CHLORIDE 0.9 % IV SOLN: INTRAVENOUS | Status: DC

## 2020-09-30 MED ORDER — HYDRALAZINE HCL 50 MG PO TABS
50.0000 mg | ORAL_TABLET | Freq: Three times a day (TID) | ORAL | Status: DC
  Administered 2020-09-30 – 2020-10-01 (×2): 50 mg via ORAL
  Filled 2020-09-30: qty 1
  Filled 2020-09-30: qty 2

## 2020-09-30 MED ORDER — PANTOPRAZOLE SODIUM 40 MG PO TBEC: 40.0000 mg | DELAYED_RELEASE_TABLET | Freq: Every day | ORAL | Status: DC | PRN

## 2020-09-30 MED ORDER — ESCITALOPRAM OXALATE 10 MG PO TABS
10.0000 mg | ORAL_TABLET | Freq: Every day | ORAL | Status: DC
  Administered 2020-09-30 – 2020-10-01 (×2): 10 mg via ORAL
  Filled 2020-09-30 (×2): qty 1

## 2020-09-30 MED ORDER — ACETAMINOPHEN 650 MG RE SUPP: 650.0000 mg | Freq: Four times a day (QID) | RECTAL | Status: DC | PRN

## 2020-09-30 MED ORDER — LOSARTAN POTASSIUM 50 MG PO TABS
100.0000 mg | ORAL_TABLET | Freq: Every evening | ORAL | Status: DC
  Administered 2020-09-30: 100 mg via ORAL
  Filled 2020-09-30: qty 2

## 2020-09-30 MED ORDER — GABAPENTIN 300 MG PO CAPS
300.0000 mg | ORAL_CAPSULE | Freq: Every day | ORAL | Status: DC
  Administered 2020-09-30: 300 mg via ORAL
  Filled 2020-09-30: qty 1

## 2020-09-30 MED ORDER — ACETAMINOPHEN 325 MG PO TABS: 650.0000 mg | ORAL_TABLET | Freq: Four times a day (QID) | ORAL | Status: DC | PRN

## 2020-09-30 MED ORDER — ONDANSETRON HCL 4 MG PO TABS: 4.0000 mg | ORAL_TABLET | Freq: Four times a day (QID) | ORAL | Status: DC | PRN

## 2020-09-30 MED ORDER — POTASSIUM CHLORIDE 10 MEQ/100ML IV SOLN
10.0000 meq | INTRAVENOUS | Status: AC
  Administered 2020-09-30 (×5): 10 meq via INTRAVENOUS
  Filled 2020-09-30 (×6): qty 100

## 2020-09-30 MED ORDER — POTASSIUM CHLORIDE CRYS ER 20 MEQ PO TBCR
40.0000 meq | EXTENDED_RELEASE_TABLET | Freq: Once | ORAL | Status: AC
  Administered 2020-09-30: 40 meq via ORAL
  Filled 2020-09-30: qty 2

## 2020-09-30 NOTE — H&P (Addendum)
History and Physical:    Tammy Allison   PZW:[id number] DOB: Mar 28, 1935 DOA: 09/30/2020  Referring MD/provider: Dr. Nicholes Stairs PCP: [name], MD   Patient coming from: Home  Chief Complaint: Maroon stools  History of Present Illness:   Tammy Allison is an 85 y.o. female with PMH significant for HTN, TIA, colonic polyps and diverticuli who was discharged 11 days ago after work-up for anemia and guaiac positive stools.  EGD was negative.  Colonoscopy however was complicated by tight sigmoid stricture and revealed diverticular disease and several large proximal colonic polyps.  The polyps were biopsied but not removed and they did show high-grade dysplasia.  Patient was scheduled for surgery consultation tomorrow morning.  Patient states that she developed maroon stools 2 days ago.  She has been in contact with her GI doctors who advised her to come in if the bleeding did not stop.  She has no complaints other than the maroon stool.  Specifically she denies shortness of breath, syncope or presyncope, chest discomfort.  Patient denies nausea vomiting or diarrhea.  Denies hematemesis.  Denies abdominal pain.  Denies recent falls.  No fevers or chills.  Patient does state that she is generally weak.  Is distressed that she has not been able to walk without a walker since her kyphoplasty 3 months ago.  She is very concerned about whether she will be able to tolerate surgery to remove the high-grade dysplasia.  She does not feel like she will be able to tolerate the surgery however is concerned about possible precancerous lesions.  ED Course:  The patient is noted to be hypertensive and afebrile.  Laboratory data was notable for hemoglobin of 8.6, with last known hemoglobin being 8.7 on discharge 09/19/2020.  She was also profoundly hypokalemic with a potassium of 2.6.  Patient was treated with IV potassium and serial hemoglobin hematocrits.  [facility] was consulted.  ROS:   ROS    Review of Systems: As per HPI  Past Medical History:   Past Medical History:  Diagnosis Date   Arthritis    GERD (gastroesophageal reflux disease)    H/O hiatal hernia    Hyperlipidemia    Hypertension    Hypothyroidism    Irritable bowel syndrome    Macular degeneration    Exudative - OD    Past Surgical History:   Past Surgical History:  Procedure Laterality Date   BIOPSY  09/17/2020   Procedure: BIOPSY;  Surgeon: Clarene Essex, MD;  Location: [place];  Service: Endoscopy;;   CATARACT EXTRACTION     COLONOSCOPY WITH PROPOFOL N/A 09/17/2020   Procedure: COLONOSCOPY WITH PROPOFOL;  Surgeon: Clarene Essex, MD;  Location: [place];  Service: Endoscopy;  Laterality: N/A;   ESOPHAGOGASTRODUODENOSCOPY (EGD) WITH PROPOFOL N/A 09/17/2020   Procedure: ESOPHAGOGASTRODUODENOSCOPY (EGD) WITH PROPOFOL;  Surgeon: Clarene Essex, MD;  Location: [place];  Service: Endoscopy;  Laterality: N/A;   EYE SURGERY Bilateral    cataract surgery w/ lens implant   FRACTURE SURGERY     right hip   LUMBAR LAMINECTOMY/DECOMPRESSION MICRODISCECTOMY Left 05/25/2014   Procedure: LUMBAR LAMINECTOMY/DECOMPRESSION MICRODISCECTOMY 1 LEVEL   lumbar three;  Surgeon: Hosie Spangle, MD;  Location: [place] [facility];  Service: Neurosurgery;  Laterality: Left;   SUBMUCOSAL TATTOO INJECTION  09/17/2020   Procedure: SUBMUCOSAL TATTOO INJECTION;  Surgeon: Clarene Essex, MD;  Location: [place];  Service: Endoscopy;;   THYROID SURGERY      Social History:   Social History  Socioeconomic History   Marital status: Married    Spouse name: Not on file   Number of children: Not on file   Years of education: Not on file   Highest education level: Not on file  Occupational History   Not on file  Tobacco Use   Smoking status: Never Smoker   Smokeless tobacco: Never Used  Vaping Use   Vaping Use: Never used  Substance and Sexual Activity   Alcohol use: No   Drug use: Never    Sexual activity: Not on file  Other Topics Concern   Not on file  Social History Narrative   Not on file   Social Determinants of Health   Financial Resource Strain:    Difficulty of Paying Living Expenses: Not on file  Food Insecurity:    Worried About [place] in the Last Year: Not on file   Ran Out of Food in the Last Year: Not on file  Transportation Needs:    Lack of Transportation (Medical): Not on file   Lack of Transportation (Non-Medical): Not on file  Physical Activity:    Days of Exercise per Week: Not on file   Minutes of Exercise per Session: Not on file  Stress:    Feeling of Stress : Not on file  Social Connections:    Frequency of Communication with Friends and Family: Not on file   Frequency of Social Gatherings with Friends and Family: Not on file   Attends Religious Services: Not on file   Active Member of Clubs or Organizations: Not on file   Attends Archivist Meetings: Not on file   Marital Status: Not on file  Intimate Partner Violence:    Fear of Current or Ex-Partner: Not on file   Emotionally Abused: Not on file   Physically Abused: Not on file   Sexually Abused: Not on file    Allergies   Sulfa antibiotics and Carvedilol  Family history:   Family History  Problem Relation Age of Onset   Macular degeneration Mother     Current Medications:   Prior to Admission medications   Medication Sig Start Date End Date Taking? Authorizing Provider  alendronate (FOSAMAX) 70 MG tablet Take 70 mg by mouth once a week. 09/13/20   [provider]  aspirin EC 81 MG EC tablet Take 1 tablet (81 mg total) by mouth daily. 04/27/19   Geradine Girt, DO  gabapentin (NEURONTIN) 300 MG capsule Take 3 capsules (900 mg total) by mouth at bedtime. Patient taking differently: Take 300 mg by mouth at bedtime.  08/14/20   Frann Rider, NP  hydrALAZINE (APRESOLINE) 50 MG tablet TAKE 1 TABLET BY MOUTH 3  TIMES  DAILY Patient taking differently: Take 50 mg by mouth in the morning and at bedtime.  01/11/20   Belva Crome, MD  levothyroxine (SYNTHROID) 100 MCG tablet Take 100 mcg by mouth daily before breakfast. 08/21/20   [provider]  losartan (COZAAR) 100 MG tablet Take 1 tablet (100 mg total) by mouth daily. Patient taking differently: Take 100 mg by mouth every evening.  04/29/19   Geradine Girt, DO  metoprolol tartrate (LOPRESSOR) 25 MG tablet Take 1 tablet (25 mg total) by mouth 2 (two) times daily. 09/19/20   Pahwani, Michell Heinrich, MD  Multiple Vitamin (MULTIVITAMIN WITH MINERALS) TABS tablet Take 1 tablet by mouth daily.    [provider]  Multiple Vitamins-Minerals (PRESERVISION AREDS) TABS Take 1 capsule by mouth  2 (two) times daily.     [provider]  ondansetron (ZOFRAN) 4 MG tablet Take 4 mg by mouth daily as needed for nausea or vomiting.  05/10/20   [provider]  pantoprazole (PROTONIX) 40 MG tablet Take 40 mg by mouth daily as needed (indigestion/acid reflux/stomach pain).     [provider]  rosuvastatin (CRESTOR) 5 MG tablet Take 5 mg by mouth every evening.     [provider]    Physical Exam:   Vitals:   09/30/20 0730 09/30/20 0800 09/30/20 0900 09/30/20 1017  BP: (!) 168/74 (!) 177/75 (!) 161/97 (!) 185/87  Pulse: 84 88 84 84  Resp: 17 16 16 17   Temp:  98.4 F (36.9 C)    TempSrc:  Axillary    SpO2: 98% 97% 97% 98%  Weight:      Height:         Physical Exam: Blood pressure (!) 185/87, pulse 84, temperature 98.4 F (36.9 C), temperature source Axillary, resp. rate 17, height 5\' 4"  (1.626 m), weight 54.4 kg, SpO2 98 %. Gen: Frail female appearing stated age lying in bed in no acute distress. Eyes: sclera anicteric, conjuctiva mildly injected bilaterally CVS: S1-S2, regulary, no gallops Respiratory:  decreased air entry likely secondary to decreased inspiratory effort GI: NABS, soft, NT, no guarding, no rebound  tenderness. LE: Trace edema bilaterally no cyanosis Neuro: A/O x 3, grossly nonfocal.  Psych: patient is logical and coherent, judgement and insight appear normal, mood and affect appropriate to situation.   Data Review:    Labs: Basic Metabolic Panel: Recent Labs  Lab 09/30/20 0602 09/30/20 0626  NA 133* 134*  K 2.7* 2.6*  CL 99 97*  CO2 24  --   GLUCOSE 102* 98  BUN 24* 23  CREATININE 1.12* 1.10*  CALCIUM 7.3*  --   MG 1.4*  --    Liver Function Tests: Recent Labs  Lab 09/30/20 0602  AST 17  ALT 12  ALKPHOS 51  BILITOT 0.7  PROT 4.6*  ALBUMIN 1.9*   No results for input(s): LIPASE, AMYLASE in the last 168 hours. No results for input(s): AMMONIA in the last 168 hours. CBC: Recent Labs  Lab 09/30/20 0602 09/30/20 0626  WBC 10.0  --   HGB 8.6* 9.2*  HCT 26.0* 27.0*  MCV 94.2  --   PLT 300  --    Cardiac Enzymes: No results for input(s): CKTOTAL, CKMB, CKMBINDEX, TROPONINI in the last 168 hours.  BNP (last 3 results) No results for input(s): PROBNP in the last 8760 hours. CBG: No results for input(s): GLUCAP in the last 168 hours.  Urinalysis    Component Value Date/Time   COLORURINE YELLOW 09/15/2020 1652   APPEARANCEUR CLOUDY (A) 09/15/2020 1652   LABSPEC 1.011 09/15/2020 1652   PHURINE 8.0 09/15/2020 1652   GLUCOSEU NEGATIVE 09/15/2020 1652   HGBUR LARGE (A) 09/15/2020 1652   BILIRUBINUR NEGATIVE 09/15/2020 [place] 09/15/2020 1652   PROTEINUR 30 (A) 09/15/2020 1652   UROBILINOGEN 0.2 05/29/2014 1122   NITRITE NEGATIVE 09/15/2020 1652   LEUKOCYTESUR NEGATIVE 09/15/2020 1652      Radiographic Studies: DG Chest Portable 1 View  Result Date: 09/30/2020 CLINICAL DATA:  GI bleed. EXAM: PORTABLE CHEST 1 VIEW COMPARISON:  09/16/2020 FINDINGS: The cardio pericardial silhouette is enlarged. There is pulmonary vascular congestion without overt pulmonary edema. Interstitial markings are diffusely coarsened with chronic features.  Small right pleural effusion evident. Skin fold overlies the  lower left lung with persistent retrocardiac left base collapse/consolidative opacity. Degenerative changes noted right glenohumeral joint. Telemetry leads overlie the chest. IMPRESSION: Cardiomegaly with vascular congestion and small right pleural effusion. Persistent left base collapse/consolidation Electronically Signed   By: Misty Stanley M.D.   On: 09/30/2020 07:43    EKG: Independently reviewed.  Sinus rhythm at 90.  First-degree heart block.  Normal axis.  Occasional PAC.  No acute ST-T wave changes.   Assessment/Plan:   Principal Problem:   Lower GI bleed Active Problems:   Benign essential hypertension   Hypothyroid   Cerebral thrombosis with transient ischemic attack (TIA)   Failure to thrive in adult   Hyperkalemia   Hematochezia 85 year old female s/p biopsy of large polyps earlier this month now presents with recurrent maroon stools  Despite maroon stools for 2 days, patient is hemodynamically stable with stable H&H Patient seen by GI today who recommend no further colonoscopies, continue conservative management with transfusions as needed. We will follow H&H every 8 hours  Hypokalemia Has been aggressively repleted, repeat K is pending  HTN Restart hydralazine and losartan per home doses  High-grade dysplasia on colonic polyps GI is recommended inpatient surgery consultation in the morning as she was supposed to have an outpatient surgery consultation regarding colonic polyps tomorrow morning. Patient seems very concerned about surgery, is not at all sure she wants to undergo it. Surgical evaluation can be done as an inpatient or outpatient as warranted, I have not consulted general surgery today.  History of TIA Hold aspirin for now Continue Crestor  GERD Continue Protonix  Hypothyroidism Continue Synthroid 100 per home doses  Anxiety and depression Continue Lexapro per home dose     Other  information:   DVT prophylaxis: SCD ordered. Code Status: Full Family Communication: None Disposition Plan: Likely home Consults called: GI, [facility] Admission status: Observation  Shayne Diguglielmo Tublu Claudina Oliphant [facility]  If 7PM-7AM, please contact night-coverage [email or web address] Password [facility] 09/30/2020, 10:43 AM

## 2020-09-30 NOTE — ED Provider Notes (Signed)
[facility] EMERGENCY DEPARTMENT Provider Note   CSN: [id number] Arrival date & time: 09/30/20  0555     History Chief Complaint  Patient presents with  . GI Bleeding    Tammy Allison is a 85 y.o. female.  The history is provided by the patient.  Rectal Bleeding Quality:  Maroon Amount:  Moderate Duration:  3 days Timing:  Constant Chronicity:  New Context: not anal fissures   Similar prior episodes: yes   Relieved by:  Nothing Worsened by:  Nothing Ineffective treatments:  None tried Associated symptoms: no abdominal pain, no dizziness and no fever   Risk factors: no anticoagulant use   Patient with recent EGD and colonoscopy presents with 3 days of maroon stool with BM.  Patient reports that she called GI with this and was told if it continued to come to the ER.  One episode on Friday, 6 on Saturday and blood in her depends this am.  No pain.  No f/c/r.  No trauma.       Past Medical History:  Diagnosis Date  . Arthritis   . GERD (gastroesophageal reflux disease)   . H/O hiatal hernia   . Hyperlipidemia   . Hypertension   . Hypothyroidism   . Irritable bowel syndrome   . Macular degeneration    Exudative - OD    Patient Active Problem List   Diagnosis Date Noted  . Lower GI bleed 09/30/2020  . Failure to thrive in adult 09/15/2020  . Hypocalcemia 04/25/2019  . Cerebral thrombosis with transient ischemic attack (TIA) 04/25/2019  . Snoring 03/17/2019  . Nausea alone 05/31/2014  . Malnutrition of moderate degree ([place]) 05/30/2014  . Weakness 05/29/2014  . Tachycardia 05/29/2014  . HNP (herniated nucleus pulposus), lumbar 05/25/2014  . Electrolyte abnormality 05/12/2014  . Hypokalemia 05/12/2014  . Hyponatremia 05/12/2014  . Lumbosacral radiculopathy 05/12/2014  . Hematuria 05/12/2014  . Benign essential hypertension 05/12/2014  . Hyperlipidemia LDL goal <100 05/12/2014  . Hypothyroid 05/12/2014    Past Surgical History:    Procedure Laterality Date  . BIOPSY  09/17/2020   Procedure: BIOPSY;  Surgeon: Clarene Essex, MD;  Location: [facility];  Service: Endoscopy;;  . CATARACT EXTRACTION    . COLONOSCOPY WITH PROPOFOL N/A 09/17/2020   Procedure: COLONOSCOPY WITH PROPOFOL;  Surgeon: Clarene Essex, MD;  Location: [facility];  Service: Endoscopy;  Laterality: N/A;  . ESOPHAGOGASTRODUODENOSCOPY (EGD) WITH PROPOFOL N/A 09/17/2020   Procedure: ESOPHAGOGASTRODUODENOSCOPY (EGD) WITH PROPOFOL;  Surgeon: Clarene Essex, MD;  Location: [facility];  Service: Endoscopy;  Laterality: N/A;  . EYE SURGERY Bilateral    cataract surgery w/ lens implant  . FRACTURE SURGERY     right hip  . LUMBAR LAMINECTOMY/DECOMPRESSION MICRODISCECTOMY Left 05/25/2014   Procedure: LUMBAR LAMINECTOMY/DECOMPRESSION MICRODISCECTOMY 1 LEVEL   lumbar three;  Surgeon: Hosie Spangle, MD;  Location: [facility];  Service: Neurosurgery;  Laterality: Left;  . SUBMUCOSAL TATTOO INJECTION  09/17/2020   Procedure: SUBMUCOSAL TATTOO INJECTION;  Surgeon: Clarene Essex, MD;  Location: [facility];  Service: Endoscopy;;  . THYROID SURGERY       OB History   No obstetric history on file.     Family History  Problem Relation Age of Onset  . Macular degeneration Mother     Social History   Tobacco Use  . Smoking status: Never Smoker  . Smokeless tobacco: Never Used  Vaping Use  . Vaping Use: Never used  Substance Use Topics  . Alcohol use:  No  . Drug use: Never    Home Medications Prior to Admission medications   Medication Sig Start Date End Date Taking? Authorizing Provider  alendronate (FOSAMAX) 70 MG tablet Take 70 mg by mouth once a week. 09/13/20   [provider]  aspirin EC 81 MG EC tablet Take 1 tablet (81 mg total) by mouth daily. 04/27/19   Geradine Girt, DO  gabapentin (NEURONTIN) 300 MG capsule Take 3 capsules (900 mg total) by mouth at bedtime. Patient taking differently: Take 300 mg by mouth at bedtime.  08/14/20    Frann Rider, NP  hydrALAZINE (APRESOLINE) 50 MG tablet TAKE 1 TABLET BY MOUTH 3  TIMES DAILY Patient taking differently: Take 50 mg by mouth in the morning and at bedtime.  01/11/20   Belva Crome, MD  levothyroxine (SYNTHROID) 100 MCG tablet Take 100 mcg by mouth daily before breakfast. 08/21/20   [provider]  losartan (COZAAR) 100 MG tablet Take 1 tablet (100 mg total) by mouth daily. Patient taking differently: Take 100 mg by mouth every evening.  04/29/19   Geradine Girt, DO  metoprolol tartrate (LOPRESSOR) 25 MG tablet Take 1 tablet (25 mg total) by mouth 2 (two) times daily. 09/19/20   Pahwani, Michell Heinrich, MD  Multiple Vitamin (MULTIVITAMIN WITH MINERALS) TABS tablet Take 1 tablet by mouth daily.    [provider]  Multiple Vitamins-Minerals (PRESERVISION AREDS) TABS Take 1 capsule by mouth 2 (two) times daily.     [provider]  ondansetron (ZOFRAN) 4 MG tablet Take 4 mg by mouth daily as needed for nausea or vomiting.  05/10/20   [provider]  pantoprazole (PROTONIX) 40 MG tablet Take 40 mg by mouth daily as needed (indigestion/acid reflux/stomach pain).     [provider]  rosuvastatin (CRESTOR) 5 MG tablet Take 5 mg by mouth every evening.     [provider]    Allergies    Sulfa antibiotics and Carvedilol  Review of Systems   Review of Systems  Constitutional: Negative for fever.  HENT: Negative for congestion.   Respiratory: Negative for shortness of breath.   Cardiovascular: Negative for chest pain.  Gastrointestinal: Positive for anal bleeding, blood in stool and hematochezia. Negative for abdominal pain.  Genitourinary: Negative for difficulty urinating.  Musculoskeletal: Negative for arthralgias.  Neurological: Negative for dizziness.  Psychiatric/Behavioral: Negative for agitation.  All other systems reviewed and are negative.   Physical Exam Updated Vital Signs BP (!) 167/62 (BP Location: Left Arm)    Pulse 94   Temp (!) 97.5 F (36.4 C) (Oral)   Resp 11   Ht 5\' 4"  (1.626 m)   Wt 54.4 kg   SpO2 96%   BMI 20.60 kg/m   Physical Exam Vitals and nursing note reviewed. Exam conducted with a chaperone present.  Constitutional:      General: She is not in acute distress.    Appearance: Normal appearance.  HENT:     Head: Normocephalic and atraumatic.     Nose: Nose normal.  Eyes:     Conjunctiva/sclera: Conjunctivae normal.     Pupils: Pupils are equal, round, and reactive to light.  Cardiovascular:     Rate and Rhythm: Normal rate and regular rhythm.     Pulses: Normal pulses.     Heart sounds: Normal heart sounds.  Pulmonary:     Effort: Pulmonary effort is normal.     Breath sounds: Normal breath sounds.  Abdominal:  General: Abdomen is flat. Bowel sounds are normal.     Palpations: Abdomen is soft.     Tenderness: There is no abdominal tenderness. There is no guarding.  Genitourinary:    Rectum: Guaiac result positive.  Musculoskeletal:        General: Normal range of motion.  Skin:    General: Skin is warm and dry.     Capillary Refill: Capillary refill takes less than 2 seconds.  Neurological:     General: No focal deficit present.     Mental Status: She is alert and oriented to person, place, and time.  Psychiatric:        Mood and Affect: Mood normal.        Behavior: Behavior normal.     ED Results / Procedures / Treatments   Labs (all labs ordered are listed, but only abnormal results are displayed) Results for orders placed or performed during the hospital encounter of 09/30/20  CBC  Result Value Ref Range   WBC 10.0 4.0 - 10.5 K/uL   RBC 2.76 (L) 3.87 - 5.11 MIL/uL   Hemoglobin 8.6 (L) 12.0 - 15.0 g/dL   HCT 26.0 (L) 36 - 46 %   MCV 94.2 80.0 - 100.0 fL   MCH 31.2 26.0 - 34.0 pg   MCHC 33.1 30.0 - 36.0 g/dL   RDW 14.6 11.5 - 15.5 %   Platelets 300 150 - 400 K/uL   nRBC 0.0 0.0 - 0.2 %  POC occult blood, ED  Result Value Ref Range   Fecal  Occult Bld POSITIVE (A) NEGATIVE  I-stat chem 8, ED (not at [facility] or [facility])  Result Value Ref Range   Sodium 134 (L) 135 - 145 mmol/L   Potassium 2.6 (LL) 3.5 - 5.1 mmol/L   Chloride 97 (L) 98 - 111 mmol/L   BUN 23 8 - 23 mg/dL   Creatinine, Ser 1.10 (H) 0.44 - 1.00 mg/dL   Glucose, Bld 98 70 - 99 mg/dL   Calcium, Ion 0.94 (L) 1.15 - 1.40 mmol/L   TCO2 22 22 - 32 mmol/L   Hemoglobin 9.2 (L) 12.0 - 15.0 g/dL   HCT 27.0 (L) 36 - 46 %   Comment NOTIFIED PHYSICIAN    DG Chest 2 View  Result Date: 09/15/2020 CLINICAL DATA:  Atrial fibrillation. EXAM: CHEST - 2 VIEW COMPARISON:  Mar 23, 2020 FINDINGS: Cardiomediastinal silhouette is normal. Mediastinal contours appear intact. There is no evidence of focal airspace consolidation, or pneumothorax. Bilateral moderate pleural effusions, left greater than right. Interval vertebroplasty of the lower thoracic spine. Soft tissues are grossly normal. IMPRESSION: Bilateral moderate pleural effusions, left greater than right. Electronically Signed   By: Fidela Salisbury M.D.   On: 09/15/2020 15:53   CT ABDOMEN PELVIS W CONTRAST  Result Date: 09/17/2020 CLINICAL DATA:  85 year old with acute abdominal pain. Weight loss. Anemia. EXAM: CT ABDOMEN AND PELVIS WITH CONTRAST TECHNIQUE: Multidetector CT imaging of the abdomen and pelvis was performed using the standard protocol following bolus administration of intravenous contrast. CONTRAST:  115mL OMNIPAQUE IOHEXOL 300 MG/ML  SOLN COMPARISON:  Abdominal CT 05/10/2020.  Chest radiograph yesterday. FINDINGS: Lower chest: Moderate bilateral pleural effusions with compressive atelectasis. Upper normal heart size. Small to moderate hiatal hernia. Hepatobiliary: Stable cysts in the right and left lobe of the liver. No new hepatic lesion. Partially distended gallbladder. No calcified gallstone. There is a Phrygian cap. No biliary dilatation. Pancreas: No ductal dilatation or inflammation. There is a 9 mm cyst  in the  pancreatic body, series 3, image 30, it was not definitively seen on prior. Spleen: Calcified granuloma.  No suspicious lesion.  Normal in size. Adrenals/Urinary Tract: Normal adrenal glands. No hydronephrosis or perinephric edema. Homogeneous renal enhancement with symmetric excretion on delayed phase imaging. Lower pole left renal cyst again seen. There is also a low-density lesion in the lower most left kidney that is too small to characterize. Urinary bladder is physiologically distended without wall thickening. Stomach/Bowel: Small to moderate hiatal hernia normal positioning of the duodenum and ligament of Treitz. There is a moderate length segment of low densities small bowel wall thickening and edema involving the pelvic bowel loops extending to the distal ileum. No obstruction, administered enteric contrast reaches the colon. Normal appendix filled with enteric contrast. Mild colonic tortuosity. Multifocal colonic diverticulosis. Mild mural hypertrophy of the sigmoid but no focally inflamed colonic wall thickening or pericolonic edema. Vascular/Lymphatic: Moderate aortic atherosclerosis. No aortic aneurysm. Mesenteric branch vessels are patent, no evidence of mesenteric thrombus or embolic disease. No enlarged lymph nodes in the abdomen or pelvis. Reproductive: Low-density endometrium measuring 13 mm, endometrial thickening versus fluid in the endometrial canal, also seen on prior exam. Enhancing focus involving the left uterine fundus likely a fibroid. There is no adnexal mass. Other: Small to moderate volume of ascites/free fluid in the abdomen and pelvis, including the mesentery. Mild mesenteric edema in the region of abnormal small bowel. No free air or focal fluid collection. Musculoskeletal: T11 compression fracture with vertebral augmentation, new from prior exam. 2 screws traverse the right femoral neck. No acute osseous abnormalities. IMPRESSION: 1. Moderate length segment of low-density small  bowel wall thickening and edema involving the pelvic bowel loops extending to the distal ileum. Differential considerations include infectious or inflammatory enteritis or bowel angioedema. Small-to-moderate volume of ascites/free fluid in the abdomen and pelvis. No perforation or abscess. 2. Moderate bilateral pleural effusions with compressive atelectasis. 3. Low-density endometrium measuring 13 mm, endometrial thickening versus fluid in the endometrial canal, also seen on prior exam. This is abnormal in a postmenopausal patient. Recommend nonemergent pelvic ultrasound for characterization on an elective outpatient basis after resolution of acute event. 4. Colonic diverticulosis without diverticulitis. 5. Small to moderate hiatal hernia. 6. A 9 mm cyst in the pancreatic body was not definitively seen on prior exam. Recommend follow up pre and post contrast MRI/MRCP or pancreatic protocol CT in 2 years. This recommendation follows ACR consensus guidelines: Management of Incidental Pancreatic Cysts: A White Paper of the ACR Incidental Findings Committee. [place] [id number]. Aortic Atherosclerosis (ICD10-I70.0). Electronically Signed   By: Keith Rake M.D.   On: 09/17/2020 22:57   Portable chest 1 View  Result Date: 09/16/2020 CLINICAL DATA:  Pleural effusion. EXAM: PORTABLE CHEST 1 VIEW COMPARISON:  09/15/2020 FINDINGS: Heart is enlarged. Lungs are hyperinflated. LEFT pleural effusion appears slightly smaller. RIGHT pleural effusion appears stable. There is persistent dense opacity at the LEFT lung base which obscures the LEFT hemidiaphragm. There is no pneumothorax. RIGHT lung is clear. IMPRESSION: 1. Slight decrease in LEFT pleural effusion. 2. Persistent dense opacity at the LEFT lung base. Electronically Signed   By: Nolon Nations M.D.   On: 09/16/2020 17:29   ECHOCARDIOGRAM COMPLETE  Result Date: 09/16/2020    ECHOCARDIOGRAM REPORT   Patient Name:   KATERI BALCH Date of  Exam: 09/16/2020 Medical Rec #:  [id number]         Height:       64.0 in  Accession #:    [id number]        Weight:       115.0 lb Date of Birth:  09-Apr-1935          BSA:          1.546 m Patient Age:    87 years          BP:           133/64 mmHg Patient Gender: F                 HR:           81 bpm. Exam Location:  Inpatient Procedure: 2D Echo, Cardiac Doppler and Color Doppler Indications:    Abnormal ECG  History:        Patient has prior history of Echocardiogram examinations, most                 recent 04/25/2019. Risk Factors:Hypertension and Dyslipidemia.                 GERD. H/O TIA.  Sonographer:    Clayton Lefort RDCS (AE) Referring Phys: [id number] ASIA B [place]  1. Left ventricular ejection fraction, by estimation, is 60 to 65%. The left ventricle has normal function. The left ventricle has no regional wall motion abnormalities. Left ventricular diastolic parameters are consistent with Grade I diastolic dysfunction (impaired relaxation).  2. Right ventricular systolic function is normal. The right ventricular size is normal. There is mildly elevated pulmonary artery systolic pressure. The estimated right ventricular systolic pressure is 09.9 mmHg.  3. Left atrial size was mildly dilated.  4. Large pleural effusion in the left lateral region.  5. The mitral valve is degenerative. Mild mitral valve regurgitation. No evidence of mitral stenosis.  6. The aortic valve is normal in structure. Aortic valve regurgitation is trivial. No aortic stenosis is present.  7. The inferior vena cava is normal in size with greater than 50% respiratory variability, suggesting right atrial pressure of 3 mmHg. FINDINGS  Left Ventricle: Left ventricular ejection fraction, by estimation, is 60 to 65%. The left ventricle has normal function. The left ventricle has no regional wall motion abnormalities. The left ventricular internal cavity size was normal in size. There is  no left ventricular hypertrophy. Left  ventricular diastolic parameters are consistent with Grade I diastolic dysfunction (impaired relaxation). Normal left ventricular filling pressure. Right Ventricle: The right ventricular size is normal. No increase in right ventricular wall thickness. Right ventricular systolic function is normal. There is mildly elevated pulmonary artery systolic pressure. The tricuspid regurgitant velocity is 2.85  m/s, and with an assumed right atrial pressure of 3 mmHg, the estimated right ventricular systolic pressure is 83.3 mmHg. Left Atrium: Left atrial size was mildly dilated. Right Atrium: Right atrial size was normal in size. Pericardium: There is no evidence of pericardial effusion. Mitral Valve: The mitral valve is degenerative in appearance. There is moderate thickening of the mitral valve leaflet(s). Mild mitral annular calcification. Mild mitral valve regurgitation. No evidence of mitral valve stenosis. Tricuspid Valve: The tricuspid valve is normal in structure. Tricuspid valve regurgitation is trivial. No evidence of tricuspid stenosis. Aortic Valve: The aortic valve is normal in structure. Aortic valve regurgitation is trivial. No aortic stenosis is present. Aortic valve mean gradient measures 4.0 mmHg. Aortic valve peak gradient measures 6.9 mmHg. Aortic valve area, by VTI measures 2.67 cm. Pulmonic Valve: The pulmonic valve was normal in structure. Pulmonic valve regurgitation is not  visualized. No evidence of pulmonic stenosis. Aorta: The aortic root is normal in size and structure. Venous: The inferior vena cava is normal in size with greater than 50% respiratory variability, suggesting right atrial pressure of 3 mmHg. IAS/Shunts: The interatrial septum appears to be lipomatous. No atrial level shunt detected by color flow Doppler. Additional Comments: There is a large pleural effusion in the left lateral region.  LEFT VENTRICLE PLAX 2D LVIDd:         3.90 cm  Diastology LVIDs:         3.00 cm  LV e' medial:     7.62 cm/s LV PW:         1.00 cm  LV E/e' medial:  12.7 LV IVS:        1.00 cm  LV e' lateral:   9.03 cm/s LVOT diam:     1.90 cm  LV E/e' lateral: 10.7 LV SV:         75 LV SV Index:   49 LVOT Area:     2.84 cm  RIGHT VENTRICLE             IVC RV Basal diam:  3.30 cm     IVC diam: 1.80 cm RV S prime:     14.10 cm/s TAPSE (M-mode): 1.8 cm LEFT ATRIUM             Index       RIGHT ATRIUM           Index LA diam:        3.20 cm 2.07 cm/m  RA Area:     15.50 cm LA Vol (A2C):   62.8 ml 40.61 ml/m RA Volume:   35.50 ml  22.96 ml/m LA Vol (A4C):   57.1 ml 36.93 ml/m LA Biplane Vol: 62.9 ml 40.68 ml/m  AORTIC VALVE AV Area (Vmax):    2.51 cm AV Area (Vmean):   2.52 cm AV Area (VTI):     2.67 cm AV Vmax:           131.00 cm/s AV Vmean:          93.300 cm/s AV VTI:            0.281 m AV Peak Grad:      6.9 mmHg AV Mean Grad:      4.0 mmHg LVOT Vmax:         116.00 cm/s LVOT Vmean:        82.900 cm/s LVOT VTI:          0.265 m LVOT/AV VTI ratio: 0.94  AORTA Ao Root diam: 3.30 cm MITRAL VALVE                TRICUSPID VALVE MV Area (PHT): 2.50 cm     TR Peak grad:   32.5 mmHg MV Decel Time: 304 msec     TR Vmax:        285.00 cm/s MV E velocity: 96.70 cm/s MV A velocity: 133.00 cm/s  SHUNTS MV E/A ratio:  0.73         Systemic VTI:  0.26 m                             Systemic Diam: 1.90 cm Fransico Him MD Electronically signed by Fransico Him MD Signature Date/Time: 09/16/2020/2:35:03 PM    Final     EKG None  Radiology No results found.  Procedures Procedures (including  critical care time)  Medications Ordered in ED Medications  potassium chloride 10 mEq in 100 mL IVPB (has no administration in time range)  0.9 %  sodium chloride infusion (has no administration in time range)  potassium chloride SA (KLOR-CON) CR tablet 40 mEq (has no administration in time range)    ED Course  I have reviewed the triage vital signs and the nursing notes.  Pertinent labs & imaging results that were available  during my care of the patient were reviewed by me and considered in my medical decision making (see chart for details).    Secure chat sent to Dr. Therisa Doyne this am.   Final Clinical Impression(s) / ED Diagnoses Final diagnoses:  Gastrointestinal hemorrhage, unspecified gastrointestinal hemorrhage type    Admit to medicine    Kitt Ledet, MD 09/30/20 [phone]

## 2020-09-30 NOTE — Consult Note (Signed)
[facility] Consultation Note  Referring Provider: [facility] Primary Care Physician:  [name], MD Primary Gastroenterologist:  Dr. Clarene Essex  Reason for Consultation:  Blood in stool  HPI: Tammy Allison is a 85 y.o. female whom we've been asked to see for above reason.  Had technically challenging endoscopy/colonoscopy about two weeks ago with Dr. Watt Climes for evaluation of anemia and hemoccult-positive stools.  EGD unrevealing but colonoscopy showed several large proximal colonic polyps (distal most region tattooed), polyps biopsied (not removed) and showed adenomatous tissue with high-grade dysplasia.  Also had diverticulosis and tight sigmoid colon stricture which was ultimately traversed with the ultraslim colonoscope.  Patient has had several episodes of red blood per rectum since her procedures, last episode last night.  Some mild abdominal discomfort and distention.  No vomiting, melena.   Past Medical History:  Diagnosis Date  . Arthritis   . GERD (gastroesophageal reflux disease)   . H/O hiatal hernia   . Hyperlipidemia   . Hypertension   . Hypothyroidism   . Irritable bowel syndrome   . Macular degeneration    Exudative - OD    Past Surgical History:  Procedure Laterality Date  . BIOPSY  09/17/2020   Procedure: BIOPSY;  Surgeon: Clarene Essex, MD;  Location: [facility];  Service: Endoscopy;;  . CATARACT EXTRACTION    . COLONOSCOPY WITH PROPOFOL N/A 09/17/2020   Procedure: COLONOSCOPY WITH PROPOFOL;  Surgeon: Clarene Essex, MD;  Location: [place];  Service: Endoscopy;  Laterality: N/A;  . ESOPHAGOGASTRODUODENOSCOPY (EGD) WITH PROPOFOL N/A 09/17/2020   Procedure: ESOPHAGOGASTRODUODENOSCOPY (EGD) WITH PROPOFOL;  Surgeon: Clarene Essex, MD;  Location: [place];  Service: Endoscopy;  Laterality: N/A;  . EYE SURGERY Bilateral    cataract surgery w/ lens implant  . FRACTURE SURGERY     right hip  . LUMBAR LAMINECTOMY/DECOMPRESSION MICRODISCECTOMY  Left 05/25/2014   Procedure: LUMBAR LAMINECTOMY/DECOMPRESSION MICRODISCECTOMY 1 LEVEL   lumbar three;  Surgeon: Hosie Spangle, MD;  Location: [place] [facility];  Service: Neurosurgery;  Laterality: Left;  . SUBMUCOSAL TATTOO INJECTION  09/17/2020   Procedure: SUBMUCOSAL TATTOO INJECTION;  Surgeon: Clarene Essex, MD;  Location: [place];  Service: Endoscopy;;  . THYROID SURGERY      Prior to Admission medications   Medication Sig Start Date End Date Taking? Authorizing Provider  alendronate (FOSAMAX) 70 MG tablet Take 70 mg by mouth once a week. 09/13/20   [provider]  aspirin EC 81 MG EC tablet Take 1 tablet (81 mg total) by mouth daily. 04/27/19   Geradine Girt, DO  gabapentin (NEURONTIN) 300 MG capsule Take 3 capsules (900 mg total) by mouth at bedtime. Patient taking differently: Take 300 mg by mouth at bedtime.  08/14/20   Frann Rider, NP  hydrALAZINE (APRESOLINE) 50 MG tablet TAKE 1 TABLET BY MOUTH 3  TIMES DAILY Patient taking differently: Take 50 mg by mouth in the morning and at bedtime.  01/11/20   Belva Crome, MD  levothyroxine (SYNTHROID) 100 MCG tablet Take 100 mcg by mouth daily before breakfast. 08/21/20   [provider]  losartan (COZAAR) 100 MG tablet Take 1 tablet (100 mg total) by mouth daily. Patient taking differently: Take 100 mg by mouth every evening.  04/29/19   Geradine Girt, DO  metoprolol tartrate (LOPRESSOR) 25 MG tablet Take 1 tablet (25 mg total) by mouth 2 (two) times daily. 09/19/20   Pahwani, Michell Heinrich, MD  Multiple Vitamin (MULTIVITAMIN WITH MINERALS) TABS tablet Take 1 tablet  by mouth daily.    [provider]  Multiple Vitamins-Minerals (PRESERVISION AREDS) TABS Take 1 capsule by mouth 2 (two) times daily.     [provider]  ondansetron (ZOFRAN) 4 MG tablet Take 4 mg by mouth daily as needed for nausea or vomiting.  05/10/20   [provider]  pantoprazole (PROTONIX) 40 MG tablet Take 40 mg by mouth daily as  needed (indigestion/acid reflux/stomach pain).     [provider]  rosuvastatin (CRESTOR) 5 MG tablet Take 5 mg by mouth every evening.     [provider]    Current Facility-Administered Medications  Medication Dose Route Frequency Provider Last Rate Last Admin  . 0.9 %  sodium chloride infusion   Intravenous Continuous Reubin Milan, MD 100 mL/hr at 09/30/20 0805 New Bag at 09/30/20 0805  . acetaminophen (TYLENOL) tablet 650 mg  650 mg Oral Q6H PRN Reubin Milan, MD       Or  . acetaminophen (TYLENOL) suppository 650 mg  650 mg Rectal Q6H PRN Reubin Milan, MD      . ondansetron [facility]) tablet 4 mg  4 mg Oral Q6H PRN Reubin Milan, MD       Or  . ondansetron [facility]) injection 4 mg  4 mg Intravenous Q6H PRN Reubin Milan, MD      . potassium chloride 10 mEq in 100 mL IVPB  10 mEq Intravenous Q1 Hr x 5 Reubin Milan, MD 100 mL/hr at 09/30/20 0904 10 mEq at 09/30/20 7628   Current Outpatient Medications  Medication Sig Dispense Refill  . alendronate (FOSAMAX) 70 MG tablet Take 70 mg by mouth once a week.    Marland Kitchen aspirin EC 81 MG EC tablet Take 1 tablet (81 mg total) by mouth daily.    Marland Kitchen gabapentin (NEURONTIN) 300 MG capsule Take 3 capsules (900 mg total) by mouth at bedtime. (Patient taking differently: Take 300 mg by mouth at bedtime. ) 270 capsule 3  . hydrALAZINE (APRESOLINE) 50 MG tablet TAKE 1 TABLET BY MOUTH 3  TIMES DAILY (Patient taking differently: Take 50 mg by mouth in the morning and at bedtime. ) 270 tablet 3  . levothyroxine (SYNTHROID) 100 MCG tablet Take 100 mcg by mouth daily before breakfast.    . losartan (COZAAR) 100 MG tablet Take 1 tablet (100 mg total) by mouth daily. (Patient taking differently: Take 100 mg by mouth every evening. )    . metoprolol tartrate (LOPRESSOR) 25 MG tablet Take 1 tablet (25 mg total) by mouth 2 (two) times daily. 60 tablet 0  . Multiple Vitamin (MULTIVITAMIN WITH MINERALS) TABS tablet Take  1 tablet by mouth daily.    . Multiple Vitamins-Minerals (PRESERVISION AREDS) TABS Take 1 capsule by mouth 2 (two) times daily.     . ondansetron (ZOFRAN) 4 MG tablet Take 4 mg by mouth daily as needed for nausea or vomiting.     . pantoprazole (PROTONIX) 40 MG tablet Take 40 mg by mouth daily as needed (indigestion/acid reflux/stomach pain).     . rosuvastatin (CRESTOR) 5 MG tablet Take 5 mg by mouth every evening.       Allergies as of 09/30/2020 - Review Complete 09/30/2020  Allergen Reaction Noted  . Sulfa antibiotics Hives 05/12/2014  . Carvedilol Rash 11/03/2018    Family History  Problem Relation Age of Onset  . Macular degeneration Mother     Social History   Socioeconomic History  . Marital status: Married  Spouse name: Not on file  . Number of children: Not on file  . Years of education: Not on file  . Highest education level: Not on file  Occupational History  . Not on file  Tobacco Use  . Smoking status: Never Smoker  . Smokeless tobacco: Never Used  Vaping Use  . Vaping Use: Never used  Substance and Sexual Activity  . Alcohol use: No  . Drug use: Never  . Sexual activity: Not on file  Other Topics Concern  . Not on file  Social History Narrative  . Not on file   Social Determinants of Health   Financial Resource Strain:   . Difficulty of Paying Living Expenses: Not on file  Food Insecurity:   . Worried About Charity fundraiser in the Last Year: Not on file  . Ran Out of Food in the Last Year: Not on file  Transportation Needs:   . Lack of Transportation (Medical): Not on file  . Lack of Transportation (Non-Medical): Not on file  Physical Activity:   . Days of Exercise per Week: Not on file  . Minutes of Exercise per Session: Not on file  Stress:   . Feeling of Stress : Not on file  Social Connections:   . Frequency of Communication with Friends and Family: Not on file  . Frequency of Social Gatherings with Friends and Family: Not on file  .  Attends Religious Services: Not on file  . Active Member of Clubs or Organizations: Not on file  . Attends Archivist Meetings: Not on file  . Marital Status: Not on file  Intimate Partner Violence:   . Fear of Current or Ex-Partner: Not on file  . Emotionally Abused: Not on file  . Physically Abused: Not on file  . Sexually Abused: Not on file    Review of Systems: As per HPI, all others negative  Physical Exam: Vital signs in last 24 hours: Temp:  [97.5 F (36.4 C)-98.4 F (36.9 C)] 98.4 F (36.9 C) (11/14 0800) Pulse Rate:  [81-94] 84 (11/14 0900) Resp:  [11-17] 16 (11/14 0900) BP: (161-181)/(62-97) 161/97 (11/14 0900) SpO2:  [95 %-98 %] 97 % (11/14 0900) Weight:  [54.4 kg] 54.4 kg (11/14 0601)   General:   Awake, elderly, chronically frail-appearing Head:  Normocephalic and atraumatic. Eyes:  Sclera clear, no icterus.   Conjunctiva pink. Ears:  Normal auditory acuity. Nose:  No deformity, discharge,  or lesions. Mouth:  No deformity or lesions.  Oropharynx pale and dry Neck:  Supple; no masses or thyromegaly. Lungs:  Clear throughout to auscultation.   No wheezes, crackles, or rhonchi. No acute distress. Heart:  Regular rate and rhythm; no murmurs, clicks, rubs,  or gallops. Abdomen:  Soft, mild distention with tympany; non-tender. No masses, hepatosplenomegaly or hernias noted. Normal bowel sounds, without guarding, and without rebound.     Msk:  Symmetrical without gross deformities. Normal posture. Pulses:  Normal pulses noted. Extremities:  Without clubbing or edema. Neurologic:  Alert and  oriented x4; diffusely weak, otherwise grossly normal neurologically. Skin:  Intact without significant lesions or rashes. Psych:  Alert and cooperative. Normal mood and affect.   Lab Results: Recent Labs    09/30/20 0602 09/30/20 0626  WBC 10.0  --   HGB 8.6* 9.2*  HCT 26.0* 27.0*  PLT 300  --    BMET Recent Labs    09/30/20 0602 09/30/20 0626  NA 133*  134*  K 2.7* 2.6*  CL 99 97*  CO2 24  --   GLUCOSE 102* 98  BUN 24* 23  CREATININE 1.12* 1.10*  CALCIUM 7.3*  --    LFT Recent Labs    09/30/20 0602  PROT 4.6*  ALBUMIN 1.9*  AST 17  ALT 12  ALKPHOS 51  BILITOT 0.7   PT/INR No results for input(s): LABPROT, INR in the last 72 hours.  Studies/Results: DG Chest Portable 1 View  Result Date: 09/30/2020 CLINICAL DATA:  GI bleed. EXAM: PORTABLE CHEST 1 VIEW COMPARISON:  09/16/2020 FINDINGS: The cardio pericardial silhouette is enlarged. There is pulmonary vascular congestion without overt pulmonary edema. Interstitial markings are diffusely coarsened with chronic features. Small right pleural effusion evident. Skin fold overlies the lower left lung with persistent retrocardiac left base collapse/consolidative opacity. Degenerative changes noted right glenohumeral joint. Telemetry leads overlie the chest. IMPRESSION: Cardiomegaly with vascular congestion and small right pleural effusion. Persistent left base collapse/consolidation Electronically Signed   By: Misty Stanley M.D.   On: 09/30/2020 07:43   Impression:  1.  Hematochezia.  This does not seem to represent post-polypectomy bleed (polyps were biopsied, not removed).  Scenario most consistent with diverticular bleeding. 2.  Acute blood loss anemia. 3.  Diverticulosis with associated sigmoid colon stricture. 4.  Multiple colon polyps, with at least high-grade dysplasia, in proximal colon.  Plan:  1.  If patient with episodes of brisk voluminous hematochezia, would do tagged RBC study as next step in management. 2.  Clear liquid diet ok. 3.  Serial CBCs, judicious hydration and analgesics as needed.  Replete potassium. 4.  No plans for endoscopy or colonoscopy.  Even if bleeding site is ultimately localized to colon, the difficulty of her recent colonoscopy makes reattempts at colonoscopy relatively contraindicated in my opinion. 5.  General surgery consultation on Monday,  which had been the plan as outpatient (for discussion of sigmoid colon stricture and discussion of possible colonic resection for multiple large proximal colonic adenomatous polyps with high-grade dysplasia). 6.  [facility] will follow.   LOS: 0 days   Kasen Sako M  09/30/2020, 10:14 AM  Cell [phone] If no answer or after 5 PM call [phone]

## 2020-09-30 NOTE — ED Triage Notes (Signed)
Pt BIB [facility] for eval of rectal bleeding, beginning around 11/1; pt had colonoscopy for same, found polyps that "need to be removed," but surgery has not been scheduled. Pt spoke to MD about continued bleeding Friday & was told to call EMS should bleeding/concern continue. Per pt, bleeding only happens when she has a bowel movement/wipes.   VSS, hx htn

## 2020-10-01 DIAGNOSIS — K922 Gastrointestinal hemorrhage, unspecified: Secondary | ICD-10-CM | POA: Diagnosis not present

## 2020-10-01 LAB — MAGNESIUM: Magnesium: 1.2 mg/dL — ABNORMAL LOW (ref 1.7–2.4)

## 2020-10-01 LAB — BASIC METABOLIC PANEL
Anion gap: 8 (ref 5–15)
BUN: 18 mg/dL (ref 8–23)
CO2: 22 mmol/L (ref 22–32)
Calcium: 6.8 mg/dL — ABNORMAL LOW (ref 8.9–10.3)
Chloride: 103 mmol/L (ref 98–111)
Creatinine, Ser: 1.02 mg/dL — ABNORMAL HIGH (ref 0.44–1.00)
GFR, Estimated: 55 mL/min — ABNORMAL LOW (ref >60)
Glucose, Bld: 151 mg/dL — ABNORMAL HIGH (ref 70–99)
Potassium: 3.3 mmol/L — ABNORMAL LOW (ref 3.5–5.1)
Sodium: 133 mmol/L — ABNORMAL LOW (ref 135–145)

## 2020-10-01 LAB — CBC
HCT: 25 % — ABNORMAL LOW (ref 36.0–46.0)
Hemoglobin: 8 g/dL — ABNORMAL LOW (ref 12.0–15.0)
MCH: 30.4 pg (ref 26.0–34.0)
MCHC: 32 g/dL (ref 30.0–36.0)
MCV: 95.1 fL (ref 80.0–100.0)
Platelets: 288 10*3/uL (ref 150–400)
RBC: 2.63 MIL/uL — ABNORMAL LOW (ref 3.87–5.11)
RDW: 14.7 % (ref 11.5–15.5)
WBC: 10.1 10*3/uL (ref 4.0–10.5)
nRBC: 0 % (ref 0.0–0.2)

## 2020-10-01 MED ORDER — POTASSIUM CHLORIDE CRYS ER 20 MEQ PO TBCR
40.0000 meq | EXTENDED_RELEASE_TABLET | ORAL | Status: DC
  Administered 2020-10-01: 40 meq via ORAL
  Filled 2020-10-01: qty 2

## 2020-10-01 MED ORDER — MAGNESIUM SULFATE 4 GM/100ML IV SOLN
4.0000 g | Freq: Once | INTRAVENOUS | Status: AC
  Administered 2020-10-01: 4 g via INTRAVENOUS
  Filled 2020-10-01: qty 100

## 2020-10-01 NOTE — Consult Note (Signed)
Reason for Consult: Lower GI bleed Referring Physician: Dr. [place]  Tammy Allison is an 85 y.o. female.  HPI: Patient is an 85 year old female, history of TIA, hypothyroidism, colonic polyps.  Patient recently was admitted secondary to anemia.  Patient underwent colonoscopy at that time and was found to have cecal polyps that were fairly significant.  These did show high-grade dysplasia.  Patient had several polyps throughout the colon.  Patient was also found to have a sigmoid stricture.  In discussing with the patient's daughter she apparently has had issues with abdominal pain in the past however was never diagnosed with diverticulitis.  Patient is readmitted to the hospital secondary to lower GI bleed.  Patient was found to have bleeding Friday to Saturday.  She was admitted and found to be anemic.  Patient was to follow-up for consultation in our office however had to reschedule secondary to hospitalization.  Patient denies any current lower GI bleeds. Patient currently only on baby aspirin.  Past Medical History:  Diagnosis Date  . Arthritis   . GERD (gastroesophageal reflux disease)   . H/O hiatal hernia   . Hyperlipidemia   . Hypertension   . Hypothyroidism   . Irritable bowel syndrome   . Macular degeneration    Exudative - OD    Past Surgical History:  Procedure Laterality Date  . BIOPSY  09/17/2020   Procedure: BIOPSY;  Surgeon: Clarene Essex, MD;  Location: [facility];  Service: Endoscopy;;  . CATARACT EXTRACTION    . COLONOSCOPY WITH PROPOFOL N/A 09/17/2020   Procedure: COLONOSCOPY WITH PROPOFOL;  Surgeon: Clarene Essex, MD;  Location: [facility];  Service: Endoscopy;  Laterality: N/A;  . ESOPHAGOGASTRODUODENOSCOPY (EGD) WITH PROPOFOL N/A 09/17/2020   Procedure: ESOPHAGOGASTRODUODENOSCOPY (EGD) WITH PROPOFOL;  Surgeon: Clarene Essex, MD;  Location: [facility];  Service: Endoscopy;  Laterality: N/A;  . EYE SURGERY Bilateral    cataract surgery w/ lens implant  .  FRACTURE SURGERY     right hip  . LUMBAR LAMINECTOMY/DECOMPRESSION MICRODISCECTOMY Left 05/25/2014   Procedure: LUMBAR LAMINECTOMY/DECOMPRESSION MICRODISCECTOMY 1 LEVEL   lumbar three;  Surgeon: Hosie Spangle, MD;  Location: [facility];  Service: Neurosurgery;  Laterality: Left;  . SUBMUCOSAL TATTOO INJECTION  09/17/2020   Procedure: SUBMUCOSAL TATTOO INJECTION;  Surgeon: Clarene Essex, MD;  Location: [facility];  Service: Endoscopy;;  . THYROID SURGERY      Family History  Problem Relation Age of Onset  . Macular degeneration Mother     Social History:  reports that she has never smoked. She has never used smokeless tobacco. She reports that she does not drink alcohol and does not use drugs.  Allergies:  Allergies  Allergen Reactions  . Sulfa Antibiotics Hives  . Carvedilol Rash    Medications: I have reviewed the patient's current medications.  Results for orders placed or performed during the hospital encounter of 09/30/20 (from the past 48 hour(s))  Type and screen [place]     Status: None   Collection Time: 09/30/20  6:00 AM  Result Value Ref Range   ABO/RH(D) O POS    Antibody Screen NEG    Sample Expiration      10/03/2020,2359 Performed at [facility], [place] [address] [place], [place] [address]   Comprehensive metabolic panel     Status: Abnormal   Collection Time: 09/30/20  6:02 AM  Result Value Ref Range   Sodium 133 (L) 135 - 145 mmol/L   Potassium 2.7 (LL) 3.5 -  5.1 mmol/L    Comment: CRITICAL RESULT CALLED TO, READ BACK BY AND VERIFIED WITH: MEGAN RUGGIERO RN [phone]    Chloride 99 98 - 111 mmol/L   CO2 24 22 - 32 mmol/L   Glucose, Bld 102 (H) 70 - 99 mg/dL    Comment: Glucose reference range applies only to samples taken after fasting for at least 8 hours.   BUN 24 (H) 8 - 23 mg/dL   Creatinine, Ser 1.12 (H) 0.44 - 1.00 mg/dL   Calcium 7.3 (L) 8.9 - 10.3 mg/dL   Total Protein 4.6 (L) 6.5 - 8.1 g/dL   Albumin  1.9 (L) 3.5 - 5.0 g/dL   AST 17 15 - 41 U/L   ALT 12 0 - 44 U/L   Alkaline Phosphatase 51 38 - 126 U/L   Total Bilirubin 0.7 0.3 - 1.2 mg/dL   GFR, Estimated 49 (L) >60 mL/min    Comment: (NOTE) Calculated using the CKD-EPI Creatinine Equation (2021)    Anion gap 10 5 - 15    Comment: Performed at [facility], [place] [address], [place], [place] [address]  CBC     Status: Abnormal   Collection Time: 09/30/20  6:02 AM  Result Value Ref Range   WBC 10.0 4.0 - 10.5 K/uL   RBC 2.76 (L) 3.87 - 5.11 MIL/uL   Hemoglobin 8.6 (L) 12.0 - 15.0 g/dL   HCT 26.0 (L) 36 - 46 %   MCV 94.2 80.0 - 100.0 fL   MCH 31.2 26.0 - 34.0 pg   MCHC 33.1 30.0 - 36.0 g/dL   RDW 14.6 11.5 - 15.5 %   Platelets 300 150 - 400 K/uL   nRBC 0.0 0.0 - 0.2 %    Comment: Performed at [facility], [place] [address], [place], [place] [address]  Magnesium     Status: Abnormal   Collection Time: 09/30/20  6:02 AM  Result Value Ref Range   Magnesium 1.4 (L) 1.7 - 2.4 mg/dL    Comment: Performed at [facility] [address] [place], [place] [address]  Respiratory Panel by RT PCR (Flu A&B, Covid) - Nasopharyngeal Swab     Status: None   Collection Time: 09/30/20  6:13 AM   Specimen: Nasopharyngeal Swab  Result Value Ref Range   SARS Coronavirus 2 by RT PCR NEGATIVE NEGATIVE    Comment: (NOTE) SARS-CoV-2 target nucleic acids are NOT DETECTED.  The SARS-CoV-2 RNA is generally detectable in upper respiratoy specimens during the acute phase of infection. The lowest concentration of SARS-CoV-2 viral copies this assay can detect is 131 copies/mL. A negative result does not preclude SARS-Cov-2 infection and should not be used as the sole basis for treatment or other patient management decisions. A negative result may occur with  improper specimen collection/handling, submission of specimen other than nasopharyngeal swab, presence of viral mutation(s) within the areas targeted by this assay, and inadequate  number of viral copies (<131 copies/mL). A negative result must be combined with clinical observations, patient history, and epidemiological information. The expected result is Negative.  Fact Sheet for Patients:  PinkCheek.be  Fact Sheet for Healthcare Providers:  GravelBags.it  This test is no t yet approved or cleared by the [place] FDA and  has been authorized for detection and/or diagnosis of SARS-CoV-2 by FDA under an Emergency Use Authorization (EUA). This EUA will remain  in effect (meaning this test can be used) for the duration of  the COVID-19 declaration under Section 564(b)(1) of the Act, 21 U.S.C. section 360bbb-3(b)(1), unless the authorization is terminated or revoked sooner.     Influenza A by PCR NEGATIVE NEGATIVE   Influenza B by PCR NEGATIVE NEGATIVE    Comment: (NOTE) The Xpert Xpress SARS-CoV-2/FLU/RSV assay is intended as an aid in  the diagnosis of influenza from Nasopharyngeal swab specimens and  should not be used as a sole basis for treatment. Nasal washings and  aspirates are unacceptable for Xpert Xpress SARS-CoV-2/FLU/RSV  testing.  Fact Sheet for Patients: PinkCheek.be  Fact Sheet for Healthcare Providers: GravelBags.it  This test is not yet approved or cleared by the [place] FDA and  has been authorized for detection and/or diagnosis of SARS-CoV-2 by  FDA under an Emergency Use Authorization (EUA). This EUA will remain  in effect (meaning this test can be used) for the duration of the  Covid-19 declaration under Section 564(b)(1) of the Act, 21  U.S.C. section 360bbb-3(b)(1), unless the authorization is  terminated or revoked. Performed at [facility], [place] [address] [place], [place] [address]   POC occult blood, ED     Status: Abnormal   Collection Time: 09/30/20  6:25 AM  Result Value Ref Range   Fecal  Occult Bld POSITIVE (A) NEGATIVE  I-stat chem 8, ED (not at [facility] or [facility])     Status: Abnormal   Collection Time: 09/30/20  6:26 AM  Result Value Ref Range   Sodium 134 (L) 135 - 145 mmol/L   Potassium 2.6 (LL) 3.5 - 5.1 mmol/L   Chloride 97 (L) 98 - 111 mmol/L   BUN 23 8 - 23 mg/dL   Creatinine, Ser 1.10 (H) 0.44 - 1.00 mg/dL   Glucose, Bld 98 70 - 99 mg/dL    Comment: Glucose reference range applies only to samples taken after fasting for at least 8 hours.   Calcium, Ion 0.94 (L) 1.15 - 1.40 mmol/L   TCO2 22 22 - 32 mmol/L   Hemoglobin 9.2 (L) 12.0 - 15.0 g/dL   HCT 27.0 (L) 36 - 46 %   Comment NOTIFIED PHYSICIAN   Basic metabolic panel     Status: Abnormal   Collection Time: 09/30/20  1:53 PM  Result Value Ref Range   Sodium 132 (L) 135 - 145 mmol/L   Potassium 4.1 3.5 - 5.1 mmol/L    Comment: DELTA CHECK NOTED NO VISIBLE HEMOLYSIS PT REC'D INFUSION    Chloride 104 98 - 111 mmol/L   CO2 21 (L) 22 - 32 mmol/L   Glucose, Bld 121 (H) 70 - 99 mg/dL    Comment: Glucose reference range applies only to samples taken after fasting for at least 8 hours.   BUN 19 8 - 23 mg/dL   Creatinine, Ser 1.09 (H) 0.44 - 1.00 mg/dL   Calcium 6.8 (L) 8.9 - 10.3 mg/dL   GFR, Estimated 50 (L) >60 mL/min    Comment: (NOTE) Calculated using the CKD-EPI Creatinine Equation (2021)    Anion gap 7 5 - 15    Comment: Performed at [facility] [address] [place], [place] [address]  Hemoglobin and hematocrit, blood     Status: Abnormal   Collection Time: 09/30/20  3:07 PM  Result Value Ref Range   Hemoglobin 8.3 (L) 12.0 - 15.0 g/dL   HCT 26.0 (L) 36 - 46 %    Comment: Performed at [facility] [address], [place], [place] [address]  CBC     Status: Abnormal   Collection Time: 10/01/20  8:33 AM  Result Value Ref Range   WBC 10.1 4.0 - 10.5 K/uL   RBC 2.63 (L) 3.87 - 5.11 MIL/uL   Hemoglobin 8.0 (L) 12.0 - 15.0 g/dL   HCT 25.0 (L) 36 - 46 %   MCV 95.1 80.0 - 100.0 fL   MCH  30.4 26.0 - 34.0 pg   MCHC 32.0 30.0 - 36.0 g/dL   RDW 14.7 11.5 - 15.5 %   Platelets 288 150 - 400 K/uL   nRBC 0.0 0.0 - 0.2 %    Comment: Performed at [facility], [place] [address] [place], [place] [address]  Basic metabolic panel     Status: Abnormal   Collection Time: 10/01/20  8:33 AM  Result Value Ref Range   Sodium 133 (L) 135 - 145 mmol/L   Potassium 3.3 (L) 3.5 - 5.1 mmol/L   Chloride 103 98 - 111 mmol/L   CO2 22 22 - 32 mmol/L   Glucose, Bld 151 (H) 70 - 99 mg/dL    Comment: Glucose reference range applies only to samples taken after fasting for at least 8 hours.   BUN 18 8 - 23 mg/dL   Creatinine, Ser 1.02 (H) 0.44 - 1.00 mg/dL   Calcium 6.8 (L) 8.9 - 10.3 mg/dL   GFR, Estimated 55 (L) >60 mL/min    Comment: (NOTE) Calculated using the CKD-EPI Creatinine Equation (2021)    Anion gap 8 5 - 15    Comment: Performed at [facility] [address], [place], [place] [address]  Magnesium     Status: Abnormal   Collection Time: 10/01/20  8:33 AM  Result Value Ref Range   Magnesium 1.2 (L) 1.7 - 2.4 mg/dL    Comment: Performed at [facility] [address], [place], [place] [address]    DG Chest Portable 1 View  Result Date: 09/30/2020 CLINICAL DATA:  GI bleed. EXAM: PORTABLE CHEST 1 VIEW COMPARISON:  09/16/2020 FINDINGS: The cardio pericardial silhouette is enlarged. There is pulmonary vascular congestion without overt pulmonary edema. Interstitial markings are diffusely coarsened with chronic features. Small right pleural effusion evident. Skin fold overlies the lower left lung with persistent retrocardiac left base collapse/consolidative opacity. Degenerative changes noted right glenohumeral joint. Telemetry leads overlie the chest. IMPRESSION: Cardiomegaly with vascular congestion and small right pleural effusion. Persistent left base collapse/consolidation Electronically Signed   By: Misty Stanley M.D.   On: 09/30/2020 07:43    Review of Systems   Constitutional: Positive for fatigue. Negative for chills and fever.  HENT: Negative for ear discharge, hearing loss and sore throat.   Eyes: Negative for discharge.  Respiratory: Negative for cough and shortness of breath.   Cardiovascular: Negative for chest pain and leg swelling.  Gastrointestinal: Negative for abdominal pain, constipation, diarrhea, nausea and vomiting.  Musculoskeletal: Negative for myalgias and neck pain.  Skin: Negative for rash.  Allergic/Immunologic: Negative for environmental allergies.  Neurological: Negative for dizziness and seizures.  Hematological: Does not bruise/bleed easily.  Psychiatric/Behavioral: Negative for suicidal ideas.  All other systems reviewed and are negative.  Blood pressure (!) 159/80, pulse 92, temperature 98.3 F (36.8 C), temperature source Oral, resp. rate 20, height 5\' 4"  (1.626 m), weight 54.4 kg, SpO2 99 %. Physical Exam Constitutional:      Appearance: She is well-developed.     Comments: Conversant No acute distress  Eyes:  General: Lids are normal. No scleral icterus.    Comments: Pupils are equal round and reactive No lid lag Moist conjunctiva  Neck:     Thyroid: No thyromegaly.     Trachea: No tracheal tenderness.     Comments: No cervical lymphadenopathy Cardiovascular:     Rate and Rhythm: Normal rate and regular rhythm.     Heart sounds: No murmur heard.   Pulmonary:     Effort: Pulmonary effort is normal.     Breath sounds: Normal breath sounds. No wheezing or rales.  Abdominal:     Palpations: Abdomen is soft.     Tenderness: There is no abdominal tenderness.     Hernia: No hernia is present.  Skin:    General: Skin is warm.     Findings: No rash.     Nails: There is no clubbing.     Comments: Normal skin turgor  Neurological:     Mental Status: She is alert and oriented to person, place, and time.     Comments: Normal gait and station  Psychiatric:        Judgment: Judgment normal.      Comments: Appropriate affect     Assessment/Plan: 85 year old female, with lower GI bleed likely secondary to colonic polyps. Colon polyps Sigmoid stricture Hypothyroidism History of TIA  1.  At this time no urgent or emergent surgery required.  I did discuss with the patient and her daughter that most likely patient would benefit from a subtotal colectomy versus possible total colectomy secondary to a history of a sigmoid stricture and multiple polyps. 2.  Would recommend that she follow-up with [place] in clinic to discuss surgical planning with our colorectal surgeons. 3.  Okay to follow-up as scheduled.  Okay to advance diet as tolerated and DC home when medically stable.  Ralene Ok 10/01/2020, 11:32 AM

## 2020-10-01 NOTE — Progress Notes (Signed)
RN gave pt discharge instructions and she stated understanding. IV's have been removed and no new medication escribed or changed on pts AVS. Belongings packed and pt is dressed waiting for her daughter for pickup

## 2020-10-01 NOTE — Discharge Summary (Signed)
Physician Discharge Summary  Tammy Allison UYQ:[id number] DOB: 15-Oct-1935 DOA: 09/30/2020  PCP: [name], MD  Admit date: 09/30/2020 Discharge date: 10/01/2020  Admitted From:  Disposition:    Recommendations for Outpatient Follow-up:  1. Follow up with PCP in 1-2 weeks 2. Please obtain BMP/CBC in one week 3. Has follow-up scheduled with general surgery on 11/20/2020 4. Will need close monitoring of hemoglobin levels discharge, and general surgery recommends intermittent transfusions outpatient as needed  Home Health: No Equipment/Devices: None  Discharge Condition: Stable CODE STATUS: Full code Diet recommendation: Heart healthy diet  History of present illness:  Tammy Allison is a 85 year old female with past medical history significant for macular degeneration, IBS, hypothyroidism, essential hypertension, hyperlipidemia, GERD, recent diagnosis of colon adenoma who presented to the emergency department at the recommendation of her gastroenterologist with recurrent lower GI bleeding.  Patient reports development of maroon stools 2 days ago.  Denies headache, no dizziness, no chest pain, no palpitations, no shortness of breath, no abdominal pain, no nausea/vomiting/diarrhea.  Patient was recently discharged after work-up for anemia with EGD unrevealing but colonoscopy notable for tight sigmoid stricture, diverticular disease and several large proximal colonic polyps with pathology consistent with high-grade dysplasia.  Patient has been referred to general surgery outpatient, but developed recurrent bleeding and was sent in by her gastroenterologist.  In the ED, patient was noted be hypertensive and afebrile.  Hemoglobin 8.6, 8.7 on 09/19/2020. She was notably profoundly hypokalemic with a potassium of 2.6 and received IV potassium. [facility] was consulted.  TRH consulted for admission with further evaluation and treatment.  Hospital course:  Lower GI bleed 2/2 colonic adenoma  with high-grade dysplasia Patient presenting to the ED with recurrent blood in her stool.  Recent colonoscopy 09/17/2020 by Dr. Watt Climes revealing sigmoid stricture and several large proximal colonic polyps with pathology consistent with high-grade dysplasia.  Hemoglobin 8.6 on admission, remained stable during hospitalization.  Was seen by GI and general surgery with recommendations of outpatient follow-up.  Patient may have intermittent bleeding from polyps in the interim, but can be transfused as needed with intermittent monitoring of hemoglobin by PCP.  No further bleeding episodes while inpatient.  Hemoglobin 8.0 at time of discharge.  Hypokalemia Hypomagnesemia Repleted during hospitalization.  Recommend repeat BMP at next PCP visit.  Essential hypertension Continue home losartan 100 mg p.o. daily, Hydralazine 50 mg p.o. 3 times daily, metoprolol tartrate 25 mg p.o. twice daily.  Hx TIA Continue aspirin and Crestor.  GERD: Continue Protonix 40 mg p.o. daily  Hypothyroidism: Continue levothyroxine 100 mcg p.o. daily  Anxiety/depression: Continue Lexapro  Discharge Diagnoses:  Principal Problem:   Lower GI bleed Active Problems:   Benign essential hypertension   Hypothyroid   Cerebral thrombosis with transient ischemic attack (TIA)   Failure to thrive in adult   Hyperkalemia    Discharge Instructions  Discharge Instructions    Diet - low sodium heart healthy   Complete by: As directed    Increase activity slowly   Complete by: As directed      Allergies as of 10/01/2020      Reactions   Sulfa Antibiotics Hives   Carvedilol Rash      Medication List    TAKE these medications   alendronate 70 MG tablet Commonly known as: FOSAMAX Take 70 mg by mouth once a week.   aspirin 81 MG EC tablet Take 1 tablet (81 mg total) by mouth daily.   escitalopram 10 MG tablet Commonly known  asLoma Sousa Take 10 mg by mouth daily.   gabapentin 300 MG capsule Commonly known  as: NEURONTIN Take 3 capsules (900 mg total) by mouth at bedtime. What changed: how much to take   hydrALAZINE 50 MG tablet Commonly known as: APRESOLINE TAKE 1 TABLET BY MOUTH 3  TIMES DAILY What changed: when to take this   IRON PO Take 1 tablet by mouth daily.   levothyroxine 100 MCG tablet Commonly known as: SYNTHROID Take 100 mcg by mouth daily before breakfast.   losartan 100 MG tablet Commonly known as: COZAAR Take 1 tablet (100 mg total) by mouth daily. What changed: when to take this   metoprolol tartrate 25 MG tablet Commonly known as: LOPRESSOR Take 1 tablet (25 mg total) by mouth 2 (two) times daily.   multivitamin with minerals Tabs tablet Take 1 tablet by mouth daily.   ondansetron 4 MG tablet Commonly known as: ZOFRAN Take 4 mg by mouth daily as needed for nausea or vomiting.   pantoprazole 40 MG tablet Commonly known as: PROTONIX Take 40 mg by mouth daily as needed (indigestion/acid reflux/stomach pain).   rosuvastatin 5 MG tablet Commonly known as: CRESTOR Take 5 mg by mouth every evening.       Follow-up Information    Leighton Ruff, MD. Go on 06/21/276.   Specialty: General Surgery Why: Follow up scheduled for 11/20/20 with Dr. Marcello Moores to discuss possible colectomy.  Contact information: [address] [place] [address] [phone]              Allergies  Allergen Reactions   Sulfa Antibiotics Hives   Carvedilol Rash    Consultations:  [facility]  General surgery   Procedures/Studies: DG Chest 2 View  Result Date: 09/15/2020 CLINICAL DATA:  Atrial fibrillation. EXAM: CHEST - 2 VIEW COMPARISON:  Mar 23, 2020 FINDINGS: Cardiomediastinal silhouette is normal. Mediastinal contours appear intact. There is no evidence of focal airspace consolidation, or pneumothorax. Bilateral moderate pleural effusions, left greater than right. Interval vertebroplasty of the lower thoracic spine. Soft tissues are grossly normal.  IMPRESSION: Bilateral moderate pleural effusions, left greater than right. Electronically Signed   By: Fidela Salisbury M.D.   On: 09/15/2020 15:53   CT ABDOMEN PELVIS W CONTRAST  Result Date: 09/17/2020 CLINICAL DATA:  85 year old with acute abdominal pain. Weight loss. Anemia. EXAM: CT ABDOMEN AND PELVIS WITH CONTRAST TECHNIQUE: Multidetector CT imaging of the abdomen and pelvis was performed using the standard protocol following bolus administration of intravenous contrast. CONTRAST:  131mL OMNIPAQUE IOHEXOL 300 MG/ML  SOLN COMPARISON:  Abdominal CT 05/10/2020.  Chest radiograph yesterday. FINDINGS: Lower chest: Moderate bilateral pleural effusions with compressive atelectasis. Upper normal heart size. Small to moderate hiatal hernia. Hepatobiliary: Stable cysts in the right and left lobe of the liver. No new hepatic lesion. Partially distended gallbladder. No calcified gallstone. There is a Phrygian cap. No biliary dilatation. Pancreas: No ductal dilatation or inflammation. There is a 9 mm cyst in the pancreatic body, series 3, image 30, it was not definitively seen on prior. Spleen: Calcified granuloma.  No suspicious lesion.  Normal in size. Adrenals/Urinary Tract: Normal adrenal glands. No hydronephrosis or perinephric edema. Homogeneous renal enhancement with symmetric excretion on delayed phase imaging. Lower pole left renal cyst again seen. There is also a low-density lesion in the lower most left kidney that is too small to characterize. Urinary bladder is physiologically distended without wall thickening. Stomach/Bowel: Small to moderate hiatal hernia normal positioning of the  duodenum and ligament of Treitz. There is a moderate length segment of low densities small bowel wall thickening and edema involving the pelvic bowel loops extending to the distal ileum. No obstruction, administered enteric contrast reaches the colon. Normal appendix filled with enteric contrast. Mild colonic tortuosity.  Multifocal colonic diverticulosis. Mild mural hypertrophy of the sigmoid but no focally inflamed colonic wall thickening or pericolonic edema. Vascular/Lymphatic: Moderate aortic atherosclerosis. No aortic aneurysm. Mesenteric branch vessels are patent, no evidence of mesenteric thrombus or embolic disease. No enlarged lymph nodes in the abdomen or pelvis. Reproductive: Low-density endometrium measuring 13 mm, endometrial thickening versus fluid in the endometrial canal, also seen on prior exam. Enhancing focus involving the left uterine fundus likely a fibroid. There is no adnexal mass. Other: Small to moderate volume of ascites/free fluid in the abdomen and pelvis, including the mesentery. Mild mesenteric edema in the region of abnormal small bowel. No free air or focal fluid collection. Musculoskeletal: T11 compression fracture with vertebral augmentation, new from prior exam. 2 screws traverse the right femoral neck. No acute osseous abnormalities. IMPRESSION: 1. Moderate length segment of low-density small bowel wall thickening and edema involving the pelvic bowel loops extending to the distal ileum. Differential considerations include infectious or inflammatory enteritis or bowel angioedema. Small-to-moderate volume of ascites/free fluid in the abdomen and pelvis. No perforation or abscess. 2. Moderate bilateral pleural effusions with compressive atelectasis. 3. Low-density endometrium measuring 13 mm, endometrial thickening versus fluid in the endometrial canal, also seen on prior exam. This is abnormal in a postmenopausal patient. Recommend nonemergent pelvic ultrasound for characterization on an elective outpatient basis after resolution of acute event. 4. Colonic diverticulosis without diverticulitis. 5. Small to moderate hiatal hernia. 6. A 9 mm cyst in the pancreatic body was not definitively seen on prior exam. Recommend follow up pre and post contrast MRI/MRCP or pancreatic protocol CT in 2 years. This  recommendation follows ACR consensus guidelines: Management of Incidental Pancreatic Cysts: A White Paper of the ACR Incidental Findings Committee. [place] 6606;30:160-109. Aortic Atherosclerosis (ICD10-I70.0). Electronically Signed   By: Keith Rake M.D.   On: 09/17/2020 22:57   DG Chest Portable 1 View  Result Date: 09/30/2020 CLINICAL DATA:  GI bleed. EXAM: PORTABLE CHEST 1 VIEW COMPARISON:  09/16/2020 FINDINGS: The cardio pericardial silhouette is enlarged. There is pulmonary vascular congestion without overt pulmonary edema. Interstitial markings are diffusely coarsened with chronic features. Small right pleural effusion evident. Skin fold overlies the lower left lung with persistent retrocardiac left base collapse/consolidative opacity. Degenerative changes noted right glenohumeral joint. Telemetry leads overlie the chest. IMPRESSION: Cardiomegaly with vascular congestion and small right pleural effusion. Persistent left base collapse/consolidation Electronically Signed   By: Misty Stanley M.D.   On: 09/30/2020 07:43   Portable chest 1 View  Result Date: 09/16/2020 CLINICAL DATA:  Pleural effusion. EXAM: PORTABLE CHEST 1 VIEW COMPARISON:  09/15/2020 FINDINGS: Heart is enlarged. Lungs are hyperinflated. LEFT pleural effusion appears slightly smaller. RIGHT pleural effusion appears stable. There is persistent dense opacity at the LEFT lung base which obscures the LEFT hemidiaphragm. There is no pneumothorax. RIGHT lung is clear. IMPRESSION: 1. Slight decrease in LEFT pleural effusion. 2. Persistent dense opacity at the LEFT lung base. Electronically Signed   By: Nolon Nations M.D.   On: 09/16/2020 17:29   ECHOCARDIOGRAM COMPLETE  Result Date: 09/16/2020    ECHOCARDIOGRAM REPORT   Patient Name:   SULEYMA WAFER Date of Exam: 09/16/2020 Medical Rec #:  [id number]  Height:       64.0 in Accession #:    [id number]        Weight:       115.0 lb Date of Birth:  [birth year]           BSA:          1.546 m Patient Age:    65 years          BP:           133/64 mmHg Patient Gender: F                 HR:           81 bpm. Exam Location:  Inpatient Procedure: 2D Echo, Cardiac Doppler and Color Doppler Indications:    Abnormal ECG  History:        Patient has prior history of Echocardiogram examinations, most                 recent 04/25/2019. Risk Factors:Hypertension and Dyslipidemia.                 GERD. H/O TIA.  Sonographer:    Clayton Lefort RDCS (AE) Referring Phys: [id number] ASIA B Ingenio  1. Left ventricular ejection fraction, by estimation, is 60 to 65%. The left ventricle has normal function. The left ventricle has no regional wall motion abnormalities. Left ventricular diastolic parameters are consistent with Grade I diastolic dysfunction (impaired relaxation).  2. Right ventricular systolic function is normal. The right ventricular size is normal. There is mildly elevated pulmonary artery systolic pressure. The estimated right ventricular systolic pressure is 62.9 mmHg.  3. Left atrial size was mildly dilated.  4. Large pleural effusion in the left lateral region.  5. The mitral valve is degenerative. Mild mitral valve regurgitation. No evidence of mitral stenosis.  6. The aortic valve is normal in structure. Aortic valve regurgitation is trivial. No aortic stenosis is present.  7. The inferior vena cava is normal in size with greater than 50% respiratory variability, suggesting right atrial pressure of 3 mmHg. FINDINGS  Left Ventricle: Left ventricular ejection fraction, by estimation, is 60 to 65%. The left ventricle has normal function. The left ventricle has no regional wall motion abnormalities. The left ventricular internal cavity size was normal in size. There is  no left ventricular hypertrophy. Left ventricular diastolic parameters are consistent with Grade I diastolic dysfunction (impaired relaxation). Normal left ventricular filling pressure. Right Ventricle: The  right ventricular size is normal. No increase in right ventricular wall thickness. Right ventricular systolic function is normal. There is mildly elevated pulmonary artery systolic pressure. The tricuspid regurgitant velocity is 2.85  m/s, and with an assumed right atrial pressure of 3 mmHg, the estimated right ventricular systolic pressure is 52.8 mmHg. Left Atrium: Left atrial size was mildly dilated. Right Atrium: Right atrial size was normal in size. Pericardium: There is no evidence of pericardial effusion. Mitral Valve: The mitral valve is degenerative in appearance. There is moderate thickening of the mitral valve leaflet(s). Mild mitral annular calcification. Mild mitral valve regurgitation. No evidence of mitral valve stenosis. Tricuspid Valve: The tricuspid valve is normal in structure. Tricuspid valve regurgitation is trivial. No evidence of tricuspid stenosis. Aortic Valve: The aortic valve is normal in structure. Aortic valve regurgitation is trivial. No aortic stenosis is present. Aortic valve mean gradient measures 4.0 mmHg. Aortic valve peak gradient measures 6.9 mmHg. Aortic valve area, by VTI measures 2.67 cm. Pulmonic Valve: The pulmonic  valve was normal in structure. Pulmonic valve regurgitation is not visualized. No evidence of pulmonic stenosis. Aorta: The aortic root is normal in size and structure. Venous: The inferior vena cava is normal in size with greater than 50% respiratory variability, suggesting right atrial pressure of 3 mmHg. IAS/Shunts: The interatrial septum appears to be lipomatous. No atrial level shunt detected by color flow Doppler. Additional Comments: There is a large pleural effusion in the left lateral region.  LEFT VENTRICLE PLAX 2D LVIDd:         3.90 cm  Diastology LVIDs:         3.00 cm  LV e' medial:    7.62 cm/s LV PW:         1.00 cm  LV E/e' medial:  12.7 LV IVS:        1.00 cm  LV e' lateral:   9.03 cm/s LVOT diam:     1.90 cm  LV E/e' lateral: 10.7 LV SV:          75 LV SV Index:   49 LVOT Area:     2.84 cm  RIGHT VENTRICLE             IVC RV Basal diam:  3.30 cm     IVC diam: 1.80 cm RV S prime:     14.10 cm/s TAPSE (M-mode): 1.8 cm LEFT ATRIUM             Index       RIGHT ATRIUM           Index LA diam:        3.20 cm 2.07 cm/m  RA Area:     15.50 cm LA Vol (A2C):   62.8 ml 40.61 ml/m RA Volume:   35.50 ml  22.96 ml/m LA Vol (A4C):   57.1 ml 36.93 ml/m LA Biplane Vol: 62.9 ml 40.68 ml/m  AORTIC VALVE AV Area (Vmax):    2.51 cm AV Area (Vmean):   2.52 cm AV Area (VTI):     2.67 cm AV Vmax:           131.00 cm/s AV Vmean:          93.300 cm/s AV VTI:            0.281 m AV Peak Grad:      6.9 mmHg AV Mean Grad:      4.0 mmHg LVOT Vmax:         116.00 cm/s LVOT Vmean:        82.900 cm/s LVOT VTI:          0.265 m LVOT/AV VTI ratio: 0.94  AORTA Ao Root diam: 3.30 cm MITRAL VALVE                TRICUSPID VALVE MV Area (PHT): 2.50 cm     TR Peak grad:   32.5 mmHg MV Decel Time: 304 msec     TR Vmax:        285.00 cm/s MV E velocity: 96.70 cm/s MV A velocity: 133.00 cm/s  SHUNTS MV E/A ratio:  0.73         Systemic VTI:  0.26 m                             Systemic Diam: 1.90 cm Fransico Him MD Electronically signed by Fransico Him MD Signature Date/Time: 09/16/2020/2:35:03 PM    Final  Subjective: Patient seen and examined bedside, resting comfortably.  States bleeding from rectum has resolved.  Has had brown stool.  Seen by general surgery and GI this morning with recommendations of outpatient follow-up with intermittent transfusions as needed.  Has scheduled follow-up with general surgery 11/20/2020.  Patient states ready for discharge home.  No other complaints or concerns at this time.  Denies headache, no dizziness, no chest pain, no shortness of breath, no abdominal pain, no fever/chills/night sweats, no nausea cefonicid/diarrhea.  No acute events overnight per nurse staff.  Discharge Exam: Vitals:   10/01/20 0740 10/01/20 1157  BP: (!) 159/80 (!)  156/68  Pulse: 92 93  Resp: 20 20  Temp: 98.3 F (36.8 C) 98 F (36.7 C)  SpO2: 99% 95%   Vitals:   09/30/20 1900 09/30/20 2110 10/01/20 0740 10/01/20 1157  BP: 116/82 (!) 154/61 (!) 159/80 (!) 156/68  Pulse: 98 92 92 93  Resp: (!) 23 (!) 21 20 20   Temp:  98.1 F (36.7 C) 98.3 F (36.8 C) 98 F (36.7 C)  TempSrc:  Axillary Oral Oral  SpO2: 97% 99% 99% 95%  Weight:      Height:        General: Pt is alert, awake, not in acute distress Cardiovascular: RRR, S1/S2 +, no rubs, no gallops Respiratory: CTA bilaterally, no wheezing, no rhonchi Abdominal: Soft, NT, ND, bowel sounds + Extremities: no edema, no cyanosis    The results of significant diagnostics from this hospitalization (including imaging, microbiology, ancillary and laboratory) are listed below for reference.     Microbiology: Recent Results (from the past 240 hour(s))  Respiratory Panel by RT PCR (Flu A&B, Covid) - Nasopharyngeal Swab     Status: None   Collection Time: 09/30/20  6:13 AM   Specimen: Nasopharyngeal Swab  Result Value Ref Range Status   SARS Coronavirus 2 by RT PCR NEGATIVE NEGATIVE Final    Comment: (NOTE) SARS-CoV-2 target nucleic acids are NOT DETECTED.  The SARS-CoV-2 RNA is generally detectable in upper respiratoy specimens during the acute phase of infection. The lowest concentration of SARS-CoV-2 viral copies this assay can detect is 131 copies/mL. A negative result does not preclude SARS-Cov-2 infection and should not be used as the sole basis for treatment or other patient management decisions. A negative result may occur with  improper specimen collection/handling, submission of specimen other than nasopharyngeal swab, presence of viral mutation(s) within the areas targeted by this assay, and inadequate number of viral copies (<131 copies/mL). A negative result must be combined with clinical observations, patient history, and epidemiological information. The expected result is  Negative.  Fact Sheet for Patients:  PinkCheek.be  Fact Sheet for Healthcare Providers:  GravelBags.it  This test is no t yet approved or cleared by the [place] FDA and  has been authorized for detection and/or diagnosis of SARS-CoV-2 by FDA under an Emergency Use Authorization (EUA). This EUA will remain  in effect (meaning this test can be used) for the duration of the COVID-19 declaration under Section 564(b)(1) of the Act, 21 U.S.C. section 360bbb-3(b)(1), unless the authorization is terminated or revoked sooner.     Influenza A by PCR NEGATIVE NEGATIVE Final   Influenza B by PCR NEGATIVE NEGATIVE Final    Comment: (NOTE) The Xpert Xpress SARS-CoV-2/FLU/RSV assay is intended as an aid in  the diagnosis of influenza from Nasopharyngeal swab specimens and  should not be used as a sole basis for treatment. Nasal washings and  aspirates are  unacceptable for Xpert Xpress SARS-CoV-2/FLU/RSV  testing.  Fact Sheet for Patients: PinkCheek.be  Fact Sheet for Healthcare Providers: GravelBags.it  This test is not yet approved or cleared by the [place] FDA and  has been authorized for detection and/or diagnosis of SARS-CoV-2 by  FDA under an Emergency Use Authorization (EUA). This EUA will remain  in effect (meaning this test can be used) for the duration of the  Covid-19 declaration under Section 564(b)(1) of the Act, 21  U.S.C. section 360bbb-3(b)(1), unless the authorization is  terminated or revoked. Performed at [facility], [place] [address] [place], [place] [address]      Labs: BNP (last 3 results) Recent Labs    09/15/20 1650  BNP 003.7*   Basic Metabolic Panel: Recent Labs  Lab 09/30/20 0602 09/30/20 0626 09/30/20 1353 10/01/20 0833  NA 133* 134* 132* 133*  K 2.7* 2.6* 4.1 3.3*  CL 99 97* 104 103  CO2 24  --  21* 22  GLUCOSE  102* 98 121* 151*  BUN 24* 23 19 18   CREATININE 1.12* 1.10* 1.09* 1.02*  CALCIUM 7.3*  --  6.8* 6.8*  MG 1.4*  --   --  1.2*   Liver Function Tests: Recent Labs  Lab 09/30/20 0602  AST 17  ALT 12  ALKPHOS 51  BILITOT 0.7  PROT 4.6*  ALBUMIN 1.9*   No results for input(s): LIPASE, AMYLASE in the last 168 hours. No results for input(s): AMMONIA in the last 168 hours. CBC: Recent Labs  Lab 09/30/20 0602 09/30/20 0626 09/30/20 1507 10/01/20 0833  WBC 10.0  --   --  10.1  HGB 8.6* 9.2* 8.3* 8.0*  HCT 26.0* 27.0* 26.0* 25.0*  MCV 94.2  --   --  95.1  PLT 300  --   --  288   Cardiac Enzymes: No results for input(s): CKTOTAL, CKMB, CKMBINDEX, TROPONINI in the last 168 hours. BNP: Invalid input(s): POCBNP CBG: No results for input(s): GLUCAP in the last 168 hours. D-Dimer No results for input(s): DDIMER in the last 72 hours. Hgb A1c No results for input(s): HGBA1C in the last 72 hours. Lipid Profile No results for input(s): CHOL, HDL, LDLCALC, TRIG, CHOLHDL, LDLDIRECT in the last 72 hours. Thyroid function studies No results for input(s): TSH, T4TOTAL, T3FREE, THYROIDAB in the last 72 hours.  Invalid input(s): FREET3 Anemia work up No results for input(s): VITAMINB12, FOLATE, FERRITIN, TIBC, IRON, RETICCTPCT in the last 72 hours. Urinalysis    Component Value Date/Time   COLORURINE YELLOW 09/15/2020 1652   APPEARANCEUR CLOUDY (A) 09/15/2020 1652   LABSPEC 1.011 09/15/2020 1652   PHURINE 8.0 09/15/2020 1652   GLUCOSEU NEGATIVE 09/15/2020 1652   HGBUR LARGE (A) 09/15/2020 1652   BILIRUBINUR NEGATIVE 09/15/2020 1652   KETONESUR NEGATIVE 09/15/2020 1652   PROTEINUR 30 (A) 09/15/2020 1652   UROBILINOGEN 0.2 05/29/2014 1122   NITRITE NEGATIVE 09/15/2020 1652   LEUKOCYTESUR NEGATIVE 09/15/2020 1652   Sepsis Labs Invalid input(s): PROCALCITONIN,  WBC,  LACTICIDVEN Microbiology Recent Results (from the past 240 hour(s))  Respiratory Panel by RT PCR (Flu A&B, Covid)  - Nasopharyngeal Swab     Status: None   Collection Time: 09/30/20  6:13 AM   Specimen: Nasopharyngeal Swab  Result Value Ref Range Status   SARS Coronavirus 2 by RT PCR NEGATIVE NEGATIVE Final    Comment: (NOTE) SARS-CoV-2 target nucleic acids are NOT DETECTED.  The SARS-CoV-2 RNA is generally detectable in upper respiratoy specimens during the acute  phase of infection. The lowest concentration of SARS-CoV-2 viral copies this assay can detect is 131 copies/mL. A negative result does not preclude SARS-Cov-2 infection and should not be used as the sole basis for treatment or other patient management decisions. A negative result may occur with  improper specimen collection/handling, submission of specimen other than nasopharyngeal swab, presence of viral mutation(s) within the areas targeted by this assay, and inadequate number of viral copies (<131 copies/mL). A negative result must be combined with clinical observations, patient history, and epidemiological information. The expected result is Negative.  Fact Sheet for Patients:  PinkCheek.be  Fact Sheet for Healthcare Providers:  [employer]  This test is no t yet approved or cleared by the [place] FDA and  has been authorized for detection and/or diagnosis of SARS-CoV-2 by FDA under an Emergency Use Authorization (EUA). This EUA will remain  in effect (meaning this test can be used) for the duration of the COVID-19 declaration under Section 564(b)(1) of the Act, 21 U.S.C. section 360bbb-3(b)(1), unless the authorization is terminated or revoked sooner.     Influenza A by PCR NEGATIVE NEGATIVE Final   Influenza B by PCR NEGATIVE NEGATIVE Final    Comment: (NOTE) The Xpert Xpress SARS-CoV-2/FLU/RSV assay is intended as an aid in  the diagnosis of influenza from Nasopharyngeal swab specimens and  should not be used as a sole basis for treatment. Nasal washings and   aspirates are unacceptable for Xpert Xpress SARS-CoV-2/FLU/RSV  testing.  Fact Sheet for Patients: PinkCheek.be  Fact Sheet for Healthcare Providers: [employer]it  This test is not yet approved or cleared by the [place] FDA and  has been authorized for detection and/or diagnosis of SARS-CoV-2 by  FDA under an Emergency Use Authorization (EUA). This EUA will remain  in effect (meaning this test can be used) for the duration of the  Covid-19 declaration under Section 564(b)(1) of the Act, 21  U.S.C. section 360bbb-3(b)(1), unless the authorization is  terminated or revoked. Performed at [facility], [place] [address], [place], [place] [address]      Time coordinating discharge: Over 30 minutes  SIGNED:   Aitanna Haubner J [place] DO  [facility] 10/01/2020, 12:53 PM

## 2020-10-01 NOTE — Progress Notes (Signed)
PROGRESS NOTE    Tammy Allison  BJS:[id number] DOB: 07-Oct-1935 DOA: 09/30/2020 PCP: [name], MD    Brief Narrative:  Tammy Allison is a 85 year old female with past medical history significant for macular degeneration, IBS, hypothyroidism, essential hypertension, hyperlipidemia, GERD, recent diagnosis of colon adenoma who presented to the emergency department at the recommendation of her gastroenterologist with recurrent lower GI bleeding.  Patient reports development of maroon stools 2 days ago.  Denies headache, no dizziness, no chest pain, no palpitations, no shortness of breath, no abdominal pain, no nausea/vomiting/diarrhea.  Patient was recently discharged after work-up for anemia with EGD unrevealing but colonoscopy notable for tight sigmoid stricture, diverticular disease and several large proximal colonic polyps with pathology consistent with high-grade dysplasia.  Patient has been referred to general surgery outpatient, but developed recurrent bleeding and was sent in by her gastroenterologist.  In the ED, patient was noted be hypertensive and afebrile.  Hemoglobin 8.6, 8.7 on 09/19/2020.  She was notably profoundly hypokalemic with a potassium of 2.6 and received IV potassium.  [facility] was consulted.  [facility] consulted for admission with further evaluation and treatment.   Assessment & Plan:   Principal Problem:   Lower GI bleed Active Problems:   Benign essential hypertension   Hypothyroid   Cerebral thrombosis with transient ischemic attack (TIA)   Failure to thrive in adult   Hyperkalemia   Lower GI bleed Colonic adenoma with high-grade dysplasia Patient presenting to the ED with recurrent blood in her stool.  Recent colonoscopy 09/17/2020 by Dr. Watt Climes revealing sigmoid stricture and several large proximal colonic polyps with pathology consistent with high-grade dysplasia. --Hgb 8.6>9.2>8.3>8.0; stable, bleeding currently has subsided --[facility] following,  appreciate assistance --General surgery consulted for evaluation --Continue to hold home aspirin --Continue monitor hemoglobin closely, transfuse for hemoglobin less than 7.0 or significant active bleeding  Hypokalemia Hypomagnesemia Potassium 2.6 on admission, repleted.  Repeat potassium this morning 3.3 with magnesium 1.2.  Will replete both potassium and magnesium today. --Repeat electrolytes in the a.m.  Essential hypertension BP 154/61 this morning. --Losartan 100 mg p.o. daily --Hydralazine 50 mg p.o. 3 times daily  Hx TIA --Hold aspirin in the setting of recurrent GI bleeding as above --Continue Crestor 5 mg p.o. daily  GERD: Continue Protonix 40 mg p.o. daily  Hypothyroidism: Continue levothyroxine 100 mcg p.o. daily  Anxiety/depression: Continue Lexapro   DVT prophylaxis: SCDs Code Status: Full code Family Communication: No family present at bedside  Disposition Plan:  Status is: Observation  The patient remains OBS appropriate and will d/c before 2 midnights.  Dispo: The patient is from: Home              Anticipated d/c is to: Home              Anticipated d/c date is: 1 day              Patient currently is not medically stable to d/c.   Consultants:   [facility], Dr. Paulita Fujita  General surgery  Procedures:   None  Antimicrobials:   None   Subjective: Patient seen and examined bedside, resting comfortably.  States bleeding has currently resolved.  Concern regarding her colon polyps and need for surgical intervention, but would like to talk to general surgery today.  Hemoglobin stable, 8.3 this morning.  No other complaints or concerns at this time.  Denies headache, no dizziness, no chest pain, no pressure palpitations, no shortness of breath, no abdominal pain, no  weakness, no fatigue, no paresthesias.  No acute events overnight per nursing staff.  Objective: Vitals:   09/30/20 1800 09/30/20 1900 09/30/20 2110 10/01/20 0740  BP: (!) 144/71 116/82  (!) 154/61 (!) 159/80  Pulse: 95 98 92 92  Resp: (!) 23 (!) 23 (!) 21 20  Temp:   98.1 F (36.7 C) 98.3 F (36.8 C)  TempSrc:   Axillary Oral  SpO2: 98% 97% 99% 99%  Weight:      Height:        Intake/Output Summary (Last 24 hours) at 10/01/2020 1046 Last data filed at 10/01/2020 0700 Gross per 24 hour  Intake 1116.2 ml  Output 300 ml  Net 816.2 ml   Filed Weights   09/30/20 0601  Weight: 54.4 kg    Examination:  General exam: Appears calm and comfortable  Respiratory system: Clear to auscultation. Respiratory effort normal.  Oxygenating well on room air Cardiovascular system: S1 & S2 heard, RRR. No JVD, murmurs, rubs, gallops or clicks. No pedal edema. Gastrointestinal system: Abdomen is nondistended, soft and nontender. No organomegaly or masses felt. Normal bowel sounds heard. Central nervous system: Alert and oriented. No focal neurological deficits. Extremities: Symmetric 5 x 5 power. Skin: No rashes, lesions or ulcers Psychiatry: Judgement and insight appear normal. Mood & affect appropriate.     Data Reviewed: I have personally reviewed following labs and imaging studies  CBC: Recent Labs  Lab 09/30/20 0602 09/30/20 0626 09/30/20 1507 10/01/20 0833  WBC 10.0  --   --  10.1  HGB 8.6* 9.2* 8.3* 8.0*  HCT 26.0* 27.0* 26.0* 25.0*  MCV 94.2  --   --  95.1  PLT 300  --   --  341   Basic Metabolic Panel: Recent Labs  Lab 09/30/20 0602 09/30/20 0626 09/30/20 1353 10/01/20 0833  NA 133* 134* 132* 133*  K 2.7* 2.6* 4.1 3.3*  CL 99 97* 104 103  CO2 24  --  21* 22  GLUCOSE 102* 98 121* 151*  BUN 24* 23 19 18   CREATININE 1.12* 1.10* 1.09* 1.02*  CALCIUM 7.3*  --  6.8* 6.8*  MG 1.4*  --   --  1.2*   GFR: Estimated Creatinine Clearance: 35.9 mL/min (A) (by C-G formula based on SCr of 1.02 mg/dL (H)). Liver Function Tests: Recent Labs  Lab 09/30/20 0602  AST 17  ALT 12  ALKPHOS 51  BILITOT 0.7  PROT 4.6*  ALBUMIN 1.9*   No results for input(s):  LIPASE, AMYLASE in the last 168 hours. No results for input(s): AMMONIA in the last 168 hours. Coagulation Profile: No results for input(s): INR, PROTIME in the last 168 hours. Cardiac Enzymes: No results for input(s): CKTOTAL, CKMB, CKMBINDEX, TROPONINI in the last 168 hours. BNP (last 3 results) No results for input(s): PROBNP in the last 8760 hours. HbA1C: No results for input(s): HGBA1C in the last 72 hours. CBG: No results for input(s): GLUCAP in the last 168 hours. Lipid Profile: No results for input(s): CHOL, HDL, LDLCALC, TRIG, CHOLHDL, LDLDIRECT in the last 72 hours. Thyroid Function Tests: No results for input(s): TSH, T4TOTAL, FREET4, T3FREE, THYROIDAB in the last 72 hours. Anemia Panel: No results for input(s): VITAMINB12, FOLATE, FERRITIN, TIBC, IRON, RETICCTPCT in the last 72 hours. Sepsis Labs: No results for input(s): PROCALCITON, LATICACIDVEN in the last 168 hours.  Recent Results (from the past 240 hour(s))  Respiratory Panel by RT PCR (Flu A&B, Covid) - Nasopharyngeal Swab     Status: None  Collection Time: 09/30/20  6:13 AM   Specimen: Nasopharyngeal Swab  Result Value Ref Range Status   SARS Coronavirus 2 by RT PCR NEGATIVE NEGATIVE Final    Comment: (NOTE) SARS-CoV-2 target nucleic acids are NOT DETECTED.  The SARS-CoV-2 RNA is generally detectable in upper respiratoy specimens during the acute phase of infection. The lowest concentration of SARS-CoV-2 viral copies this assay can detect is 131 copies/mL. A negative result does not preclude SARS-Cov-2 infection and should not be used as the sole basis for treatment or other patient management decisions. A negative result may occur with  improper specimen collection/handling, submission of specimen other than nasopharyngeal swab, presence of viral mutation(s) within the areas targeted by this assay, and inadequate number of viral copies (<131 copies/mL). A negative result must be combined with  clinical observations, patient history, and epidemiological information. The expected result is Negative.  Fact Sheet for Patients:  PinkCheek.be  Fact Sheet for Healthcare Providers:  [employer]it  This test is no t yet approved or cleared by the [place] FDA and  has been authorized for detection and/or diagnosis of SARS-CoV-2 by FDA under an Emergency Use Authorization (EUA). This EUA will remain  in effect (meaning this test can be used) for the duration of the COVID-19 declaration under Section 564(b)(1) of the Act, 21 U.S.C. section 360bbb-3(b)(1), unless the authorization is terminated or revoked sooner.     Influenza A by PCR NEGATIVE NEGATIVE Final   Influenza B by PCR NEGATIVE NEGATIVE Final    Comment: (NOTE) The Xpert Xpress SARS-CoV-2/FLU/RSV assay is intended as an aid in  the diagnosis of influenza from Nasopharyngeal swab specimens and  should not be used as a sole basis for treatment. Nasal washings and  aspirates are unacceptable for Xpert Xpress SARS-CoV-2/FLU/RSV  testing.  Fact Sheet for Patients: PinkCheek.be  Fact Sheet for Healthcare Providers: [employer]it  This test is not yet approved or cleared by the [place] FDA and  has been authorized for detection and/or diagnosis of SARS-CoV-2 by  FDA under an Emergency Use Authorization (EUA). This EUA will remain  in effect (meaning this test can be used) for the duration of the  Covid-19 declaration under Section 564(b)(1) of the Act, 21  U.S.C. section 360bbb-3(b)(1), unless the authorization is  terminated or revoked. Performed at [facility], [place] [address] [place], [place] [address]          Radiology Studies: DG Chest Portable 1 View  Result Date: 09/30/2020 CLINICAL DATA:  GI bleed. EXAM: PORTABLE CHEST 1 VIEW COMPARISON:  09/16/2020 FINDINGS: The cardio  pericardial silhouette is enlarged. There is pulmonary vascular congestion without overt pulmonary edema. Interstitial markings are diffusely coarsened with chronic features. Small right pleural effusion evident. Skin fold overlies the lower left lung with persistent retrocardiac left base collapse/consolidative opacity. Degenerative changes noted right glenohumeral joint. Telemetry leads overlie the chest. IMPRESSION: Cardiomegaly with vascular congestion and small right pleural effusion. Persistent left base collapse/consolidation Electronically Signed   By: Misty Stanley M.D.   On: 09/30/2020 07:43        Scheduled Meds: . escitalopram  10 mg Oral Daily  . gabapentin  300 mg Oral QHS  . hydrALAZINE  50 mg Oral TID  . levothyroxine  100 mcg Oral QAC breakfast  . losartan  100 mg Oral QPM  . rosuvastatin  5 mg Oral QPM   Continuous Infusions: . sodium chloride 100 mL/hr at 10/01/20 0319     LOS: 0 days  Time spent: 36 minutes spent on chart review, discussion with nursing staff, consultants, updating family and interview/physical exam; more than 50% of that time was spent in counseling and/or coordination of care.    Sammie Schermerhorn J [place] DO [facility] Available via Epic secure chat 7am-7pm After these hours, please refer to coverage provider listed on [email or web address] 10/01/2020, 10:46 AM

## 2020-10-01 NOTE — Progress Notes (Signed)
[facility] Gastroenterology Progress Note  Tammy Allison 85 y.o. 1935-06-18  CC:  Rectal bleeding  Subjective: No further signs of rectal bleeding. Per patient, her last 2 stools have been brown and without any bleeding.  She denies any abdominal pain, nausea, or vomiting.  Per review of flowsheet, had a brown stool overnight.  ROS : Review of Systems  Cardiovascular: Negative for chest pain and palpitations.  Gastrointestinal: Negative for abdominal pain, blood in stool, constipation, diarrhea, heartburn, melena, nausea and vomiting.   Objective: Vital signs in last 24 hours: Vitals:   09/30/20 2110 10/01/20 0740  BP: (!) 154/61 (!) 159/80  Pulse: 92 92  Resp: (!) 21 20  Temp: 98.1 F (36.7 C) 98.3 F (36.8 C)  SpO2: 99% 99%    Physical Exam:  General:  Alert, oriented, cooperative, no acute distress  Head:  Normocephalic, without obvious abnormality, atraumatic  Eyes:  Anicteric sclera, EOMs intact  Lungs:   Clear to auscultation bilaterally, respirations unlabored  Heart:  Regular rate and rhythm, S1, S2 normal  Abdomen:   Soft, non-tender, non-distended, bowel sounds active all four quadrants,  no guarding or peritoneal signs  Extremities: Extremities normal, atraumatic, no  edema  Pulses: 2+ and symmetric    Lab Results: Recent Labs    09/30/20 0602 09/30/20 0602 09/30/20 0626 09/30/20 1353  NA 133*   < > 134* 132*  K 2.7*   < > 2.6* 4.1  CL 99   < > 97* 104  CO2 24  --   --  21*  GLUCOSE 102*   < > 98 121*  BUN 24*   < > 23 19  CREATININE 1.12*   < > 1.10* 1.09*  CALCIUM 7.3*  --   --  6.8*  MG 1.4*  --   --   --    < > = values in this interval not displayed.   Recent Labs    09/30/20 0602  AST 17  ALT 12  ALKPHOS 51  BILITOT 0.7  PROT 4.6*  ALBUMIN 1.9*   Recent Labs    09/30/20 0602 09/30/20 0602 09/30/20 0626 09/30/20 1507  WBC 10.0  --   --   --   HGB 8.6*   < > 9.2* 8.3*  HCT 26.0*   < > 27.0* 26.0*  MCV 94.2  --   --   --   PLT  300  --   --   --    < > = values in this interval not displayed.   No results for input(s): LABPROT, INR in the last 72 hours.    Assessment: Rectal bleeding, resolved -Hgb 8.0 today, sable as compared to 8.3 yesterday and 8.8 on 11/14  Sigmoid colon stricture multiple large proximal colonic adenomatous polyps with high-grade dysplasia  Plan: Okay to discharge from a GI standpoint.  Outpatient versus inpatient management by surgical team.  Okay to advance diet to soft from a GI standpoint.  [facility] will sign off.  Please contact us if we can be of any further assistance during his hospital stay.  Salley Slaughter PA-C 10/01/2020, 8:40 AM  Contact #  [phone]

## 2020-10-10 ENCOUNTER — Ambulatory Visit: Payer: Medicare Other | Admitting: Cardiology

## 2020-11-08 ENCOUNTER — Other Ambulatory Visit: Payer: Self-pay | Admitting: Family Medicine

## 2020-11-08 DIAGNOSIS — N1831 Chronic kidney disease, stage 3a: Secondary | ICD-10-CM

## 2020-11-08 DIAGNOSIS — N289 Disorder of kidney and ureter, unspecified: Secondary | ICD-10-CM

## 2020-11-14 ENCOUNTER — Other Ambulatory Visit: Payer: Self-pay

## 2020-11-14 ENCOUNTER — Emergency Department (HOSPITAL_COMMUNITY)
Admission: EM | Admit: 2020-11-14 | Discharge: 2020-11-15 | Disposition: A | Discharge status: Home / Self Care | Payer: Medicare Other | Source: Home / Self Care

## 2020-11-14 ENCOUNTER — Encounter (HOSPITAL_COMMUNITY): Payer: Self-pay | Admitting: Emergency Medicine

## 2020-11-14 DIAGNOSIS — Z5321 Procedure and treatment not carried out due to patient leaving prior to being seen by health care provider: Secondary | ICD-10-CM | POA: Insufficient documentation

## 2020-11-14 DIAGNOSIS — R799 Abnormal finding of blood chemistry, unspecified: Secondary | ICD-10-CM | POA: Insufficient documentation

## 2020-11-14 DIAGNOSIS — N179 Acute kidney failure, unspecified: Secondary | ICD-10-CM | POA: Diagnosis not present

## 2020-11-14 DIAGNOSIS — R531 Weakness: Secondary | ICD-10-CM | POA: Diagnosis not present

## 2020-11-14 LAB — SAMPLE TO BLOOD BANK

## 2020-11-14 LAB — COMPREHENSIVE METABOLIC PANEL
ALT: 17 U/L (ref 0–44)
AST: 23 U/L (ref 15–41)
Albumin: 1.4 g/dL — ABNORMAL LOW (ref 3.5–5.0)
Alkaline Phosphatase: 82 U/L (ref 38–126)
Anion gap: 9 (ref 5–15)
BUN: 35 mg/dL — ABNORMAL HIGH (ref 8–23)
CO2: 24 mmol/L (ref 22–32)
Calcium: 6.8 mg/dL — ABNORMAL LOW (ref 8.9–10.3)
Chloride: 108 mmol/L (ref 98–111)
Creatinine, Ser: 2.33 mg/dL — ABNORMAL HIGH (ref 0.44–1.00)
GFR, Estimated: 20 mL/min — ABNORMAL LOW (ref >60)
Glucose, Bld: 150 mg/dL — ABNORMAL HIGH (ref 70–99)
Potassium: 2.5 mmol/L — CL (ref 3.5–5.1)
Sodium: 141 mmol/L (ref 135–145)
Total Bilirubin: 0.3 mg/dL (ref 0.3–1.2)
Total Protein: 4.9 g/dL — ABNORMAL LOW (ref 6.5–8.1)

## 2020-11-14 LAB — CBC WITH DIFFERENTIAL/PLATELET
Abs Immature Granulocytes: 0.05 10*3/uL (ref 0.00–0.07)
Basophils Absolute: 0.1 10*3/uL (ref 0.0–0.1)
Basophils Relative: 1 %
Eosinophils Absolute: 0 10*3/uL (ref 0.0–0.5)
Eosinophils Relative: 0 %
HCT: 23 % — ABNORMAL LOW (ref 36.0–46.0)
Hemoglobin: 7.1 g/dL — ABNORMAL LOW (ref 12.0–15.0)
Immature Granulocytes: 1 %
Lymphocytes Relative: 16 %
Lymphs Abs: 1.4 10*3/uL (ref 0.7–4.0)
MCH: 30.3 pg (ref 26.0–34.0)
MCHC: 30.9 g/dL (ref 30.0–36.0)
MCV: 98.3 fL (ref 80.0–100.0)
Monocytes Absolute: 0.8 10*3/uL (ref 0.1–1.0)
Monocytes Relative: 9 %
Neutro Abs: 6.3 10*3/uL (ref 1.7–7.7)
Neutrophils Relative %: 73 %
Platelets: 303 10*3/uL (ref 150–400)
RBC: 2.34 MIL/uL — ABNORMAL LOW (ref 3.87–5.11)
RDW: 15.2 % (ref 11.5–15.5)
WBC: 8.5 10*3/uL (ref 4.0–10.5)
nRBC: 0 % (ref 0.0–0.2)

## 2020-11-14 LAB — PROTIME-INR
INR: 1.3 — ABNORMAL HIGH (ref 0.8–1.2)
Prothrombin Time: 15.2 seconds (ref 11.4–15.2)

## 2020-11-14 NOTE — ED Notes (Addendum)
NT went to update pts VS. Pt stated that her daughter was already on the way to pick her up and that she would contact her doctor in the morning. Pt LWBS. Pt was advised to stay but is set on leaving.

## 2020-11-14 NOTE — ED Triage Notes (Signed)
Patient arrived with EMS from home , she received a call from her PCP this evening due to abnormal blood tests results taken today at [facility] . Alert /respirations unlabored , denies pain .

## 2020-11-14 NOTE — ED Notes (Signed)
Dr. Lockie Mola ( EDP)  notified on patient's hypokalemia .

## 2020-11-16 ENCOUNTER — Other Ambulatory Visit: Payer: Self-pay

## 2020-11-16 ENCOUNTER — Inpatient Hospital Stay (HOSPITAL_COMMUNITY)
Admission: EM | Admit: 2020-11-16 | Discharge: 2020-11-19 | DRG: 682 | Disposition: A | Discharge status: Hospice | Payer: Medicare Other | Attending: Family Medicine | Admitting: Family Medicine

## 2020-11-16 ENCOUNTER — Emergency Department (HOSPITAL_COMMUNITY): Payer: Medicare Other

## 2020-11-16 ENCOUNTER — Encounter (HOSPITAL_COMMUNITY): Payer: Self-pay

## 2020-11-16 DIAGNOSIS — E876 Hypokalemia: Secondary | ICD-10-CM | POA: Diagnosis present

## 2020-11-16 DIAGNOSIS — K922 Gastrointestinal hemorrhage, unspecified: Secondary | ICD-10-CM | POA: Diagnosis present

## 2020-11-16 DIAGNOSIS — Z6822 Body mass index (BMI) 22.0-22.9, adult: Secondary | ICD-10-CM

## 2020-11-16 DIAGNOSIS — Z66 Do not resuscitate: Secondary | ICD-10-CM | POA: Diagnosis present

## 2020-11-16 DIAGNOSIS — I5032 Chronic diastolic (congestive) heart failure: Secondary | ICD-10-CM | POA: Diagnosis not present

## 2020-11-16 DIAGNOSIS — Z7982 Long term (current) use of aspirin: Secondary | ICD-10-CM

## 2020-11-16 DIAGNOSIS — R627 Adult failure to thrive: Secondary | ICD-10-CM | POA: Diagnosis present

## 2020-11-16 DIAGNOSIS — E039 Hypothyroidism, unspecified: Secondary | ICD-10-CM | POA: Diagnosis present

## 2020-11-16 DIAGNOSIS — N183 Chronic kidney disease, stage 3 unspecified: Secondary | ICD-10-CM

## 2020-11-16 DIAGNOSIS — I471 Supraventricular tachycardia: Secondary | ICD-10-CM | POA: Diagnosis present

## 2020-11-16 DIAGNOSIS — E785 Hyperlipidemia, unspecified: Secondary | ICD-10-CM | POA: Diagnosis present

## 2020-11-16 DIAGNOSIS — I10 Essential (primary) hypertension: Secondary | ICD-10-CM | POA: Diagnosis not present

## 2020-11-16 DIAGNOSIS — L03115 Cellulitis of right lower limb: Secondary | ICD-10-CM | POA: Diagnosis present

## 2020-11-16 DIAGNOSIS — R5381 Other malaise: Secondary | ICD-10-CM | POA: Diagnosis present

## 2020-11-16 DIAGNOSIS — I5033 Acute on chronic diastolic (congestive) heart failure: Secondary | ICD-10-CM | POA: Diagnosis present

## 2020-11-16 DIAGNOSIS — Z20822 Contact with and (suspected) exposure to covid-19: Secondary | ICD-10-CM | POA: Diagnosis present

## 2020-11-16 DIAGNOSIS — E878 Other disorders of electrolyte and fluid balance, not elsewhere classified: Secondary | ICD-10-CM | POA: Diagnosis not present

## 2020-11-16 DIAGNOSIS — Z79890 Hormone replacement therapy (postmenopausal): Secondary | ICD-10-CM

## 2020-11-16 DIAGNOSIS — Z79899 Other long term (current) drug therapy: Secondary | ICD-10-CM

## 2020-11-16 DIAGNOSIS — I700 Atherosclerosis of aorta: Secondary | ICD-10-CM | POA: Diagnosis present

## 2020-11-16 DIAGNOSIS — R899 Unspecified abnormal finding in specimens from other organs, systems and tissues: Secondary | ICD-10-CM | POA: Diagnosis not present

## 2020-11-16 DIAGNOSIS — L89152 Pressure ulcer of sacral region, stage 2: Secondary | ICD-10-CM | POA: Diagnosis present

## 2020-11-16 DIAGNOSIS — Z515 Encounter for palliative care: Secondary | ICD-10-CM | POA: Diagnosis not present

## 2020-11-16 DIAGNOSIS — I5031 Acute diastolic (congestive) heart failure: Secondary | ICD-10-CM

## 2020-11-16 DIAGNOSIS — K56699 Other intestinal obstruction unspecified as to partial versus complete obstruction: Secondary | ICD-10-CM | POA: Diagnosis present

## 2020-11-16 DIAGNOSIS — D500 Iron deficiency anemia secondary to blood loss (chronic): Secondary | ICD-10-CM | POA: Diagnosis present

## 2020-11-16 DIAGNOSIS — N1831 Chronic kidney disease, stage 3a: Secondary | ICD-10-CM | POA: Diagnosis present

## 2020-11-16 DIAGNOSIS — Z7189 Other specified counseling: Secondary | ICD-10-CM | POA: Diagnosis not present

## 2020-11-16 DIAGNOSIS — Z888 Allergy status to other drugs, medicaments and biological substances status: Secondary | ICD-10-CM

## 2020-11-16 DIAGNOSIS — L03114 Cellulitis of left upper limb: Secondary | ICD-10-CM | POA: Diagnosis present

## 2020-11-16 DIAGNOSIS — E43 Unspecified severe protein-calorie malnutrition: Secondary | ICD-10-CM | POA: Diagnosis present

## 2020-11-16 DIAGNOSIS — M7989 Other specified soft tissue disorders: Secondary | ICD-10-CM | POA: Diagnosis not present

## 2020-11-16 DIAGNOSIS — I130 Hypertensive heart and chronic kidney disease with heart failure and stage 1 through stage 4 chronic kidney disease, or unspecified chronic kidney disease: Secondary | ICD-10-CM | POA: Diagnosis present

## 2020-11-16 DIAGNOSIS — N179 Acute kidney failure, unspecified: Principal | ICD-10-CM | POA: Diagnosis present

## 2020-11-16 DIAGNOSIS — Z8673 Personal history of transient ischemic attack (TIA), and cerebral infarction without residual deficits: Secondary | ICD-10-CM

## 2020-11-16 DIAGNOSIS — R531 Weakness: Secondary | ICD-10-CM

## 2020-11-16 DIAGNOSIS — K635 Polyp of colon: Secondary | ICD-10-CM | POA: Diagnosis present

## 2020-11-16 DIAGNOSIS — L0390 Cellulitis, unspecified: Secondary | ICD-10-CM | POA: Diagnosis not present

## 2020-11-16 DIAGNOSIS — Z882 Allergy status to sulfonamides status: Secondary | ICD-10-CM | POA: Diagnosis not present

## 2020-11-16 DIAGNOSIS — L8990 Pressure ulcer of unspecified site, unspecified stage: Secondary | ICD-10-CM | POA: Insufficient documentation

## 2020-11-16 DIAGNOSIS — K219 Gastro-esophageal reflux disease without esophagitis: Secondary | ICD-10-CM | POA: Diagnosis present

## 2020-11-16 DIAGNOSIS — D649 Anemia, unspecified: Secondary | ICD-10-CM

## 2020-11-16 LAB — COMPREHENSIVE METABOLIC PANEL
ALT: 18 U/L (ref 0–44)
AST: 22 U/L (ref 15–41)
Albumin: 1.7 g/dL — ABNORMAL LOW (ref 3.5–5.0)
Alkaline Phosphatase: 83 U/L (ref 38–126)
Anion gap: 8 (ref 5–15)
BUN: 39 mg/dL — ABNORMAL HIGH (ref 8–23)
CO2: 24 mmol/L (ref 22–32)
Calcium: 6.8 mg/dL — ABNORMAL LOW (ref 8.9–10.3)
Chloride: 108 mmol/L (ref 98–111)
Creatinine, Ser: 2.21 mg/dL — ABNORMAL HIGH (ref 0.44–1.00)
GFR, Estimated: 22 mL/min — ABNORMAL LOW (ref >60)
Glucose, Bld: 102 mg/dL — ABNORMAL HIGH (ref 70–99)
Potassium: 2.6 mmol/L — CL (ref 3.5–5.1)
Sodium: 140 mmol/L (ref 135–145)
Total Bilirubin: 0.4 mg/dL (ref 0.3–1.2)
Total Protein: 5.2 g/dL — ABNORMAL LOW (ref 6.5–8.1)

## 2020-11-16 LAB — BASIC METABOLIC PANEL
Anion gap: 9 (ref 5–15)
BUN: 35 mg/dL — ABNORMAL HIGH (ref 8–23)
CO2: 22 mmol/L (ref 22–32)
Calcium: 6.7 mg/dL — ABNORMAL LOW (ref 8.9–10.3)
Chloride: 110 mmol/L (ref 98–111)
Creatinine, Ser: 2.17 mg/dL — ABNORMAL HIGH (ref 0.44–1.00)
GFR, Estimated: 22 mL/min — ABNORMAL LOW (ref >60)
Glucose, Bld: 134 mg/dL — ABNORMAL HIGH (ref 70–99)
Potassium: 3.3 mmol/L — ABNORMAL LOW (ref 3.5–5.1)
Sodium: 141 mmol/L (ref 135–145)

## 2020-11-16 LAB — LIPID PANEL
Cholesterol: 72 mg/dL (ref 0–200)
HDL: 43 mg/dL (ref >40)
LDL Cholesterol: 22 mg/dL (ref 0–99)
Total CHOL/HDL Ratio: 1.7 RATIO
Triglycerides: 34 mg/dL (ref <150)
VLDL: 7 mg/dL (ref 0–40)

## 2020-11-16 LAB — HEMOGLOBIN AND HEMATOCRIT, BLOOD
HCT: 28.5 % — ABNORMAL LOW (ref 36.0–46.0)
Hemoglobin: 9 g/dL — ABNORMAL LOW (ref 12.0–15.0)

## 2020-11-16 LAB — PREPARE RBC (CROSSMATCH)

## 2020-11-16 LAB — CBC
HCT: 24 % — ABNORMAL LOW (ref 36.0–46.0)
Hemoglobin: 7.6 g/dL — ABNORMAL LOW (ref 12.0–15.0)
MCH: 31.1 pg (ref 26.0–34.0)
MCHC: 31.7 g/dL (ref 30.0–36.0)
MCV: 98.4 fL (ref 80.0–100.0)
Platelets: 286 10*3/uL (ref 150–400)
RBC: 2.44 MIL/uL — ABNORMAL LOW (ref 3.87–5.11)
RDW: 15.2 % (ref 11.5–15.5)
WBC: 8.5 10*3/uL (ref 4.0–10.5)
nRBC: 0 % (ref 0.0–0.2)

## 2020-11-16 LAB — MAGNESIUM: Magnesium: 1.4 mg/dL — ABNORMAL LOW (ref 1.7–2.4)

## 2020-11-16 LAB — TSH: TSH: 4.698 u[IU]/mL — ABNORMAL HIGH (ref 0.350–4.500)

## 2020-11-16 LAB — SARS CORONAVIRUS 2 (TAT 6-24 HRS): SARS Coronavirus 2: NEGATIVE

## 2020-11-16 LAB — BRAIN NATRIURETIC PEPTIDE: B Natriuretic Peptide: 272.9 pg/mL — ABNORMAL HIGH (ref 0.0–100.0)

## 2020-11-16 MED ORDER — PANTOPRAZOLE SODIUM 40 MG PO TBEC: 40.0000 mg | DELAYED_RELEASE_TABLET | Freq: Every day | ORAL | Status: DC | PRN

## 2020-11-16 MED ORDER — ESCITALOPRAM OXALATE 10 MG PO TABS
10.0000 mg | ORAL_TABLET | Freq: Every day | ORAL | Status: DC
  Administered 2020-11-17 – 2020-11-19 (×3): 10 mg via ORAL
  Filled 2020-11-16 (×3): qty 1

## 2020-11-16 MED ORDER — SODIUM CHLORIDE 0.9% IV SOLUTION: Freq: Once | INTRAVENOUS | Status: AC

## 2020-11-16 MED ORDER — POTASSIUM CHLORIDE CRYS ER 20 MEQ PO TBCR
40.0000 meq | EXTENDED_RELEASE_TABLET | Freq: Once | ORAL | Status: AC
  Administered 2020-11-16: 40 meq via ORAL
  Filled 2020-11-16: qty 2

## 2020-11-16 MED ORDER — DOXYCYCLINE HYCLATE 100 MG PO TABS
100.0000 mg | ORAL_TABLET | Freq: Two times a day (BID) | ORAL | Status: DC
  Administered 2020-11-16 – 2020-11-19 (×6): 100 mg via ORAL
  Filled 2020-11-16 (×6): qty 1

## 2020-11-16 MED ORDER — HYDRALAZINE HCL 50 MG PO TABS
50.0000 mg | ORAL_TABLET | Freq: Two times a day (BID) | ORAL | Status: DC
  Administered 2020-11-16 – 2020-11-19 (×6): 50 mg via ORAL
  Filled 2020-11-16 (×6): qty 1

## 2020-11-16 MED ORDER — FUROSEMIDE 10 MG/ML IJ SOLN
20.0000 mg | Freq: Once | INTRAMUSCULAR | Status: AC
  Administered 2020-11-16: 20 mg via INTRAVENOUS
  Filled 2020-11-16: qty 2

## 2020-11-16 MED ORDER — SODIUM CHLORIDE 0.9% FLUSH
3.0000 mL | Freq: Two times a day (BID) | INTRAVENOUS | Status: DC
  Administered 2020-11-17 – 2020-11-19 (×5): 3 mL via INTRAVENOUS

## 2020-11-16 MED ORDER — POTASSIUM CHLORIDE 10 MEQ/100ML IV SOLN
10.0000 meq | INTRAVENOUS | Status: AC
  Administered 2020-11-16 (×2): 10 meq via INTRAVENOUS
  Filled 2020-11-16 (×3): qty 100

## 2020-11-16 MED ORDER — LEVOTHYROXINE SODIUM 100 MCG PO TABS
100.0000 ug | ORAL_TABLET | Freq: Every day | ORAL | Status: DC
  Administered 2020-11-17 – 2020-11-19 (×3): 100 ug via ORAL
  Filled 2020-11-16 (×3): qty 1

## 2020-11-16 MED ORDER — HEPARIN SODIUM (PORCINE) 5000 UNIT/ML IJ SOLN: 5000.0000 [IU] | Freq: Three times a day (TID) | INTRAMUSCULAR | Status: DC

## 2020-11-16 MED ORDER — ACETAMINOPHEN 325 MG PO TABS: 650.0000 mg | ORAL_TABLET | Freq: Four times a day (QID) | ORAL | Status: DC | PRN

## 2020-11-16 MED ORDER — ROSUVASTATIN CALCIUM 5 MG PO TABS
5.0000 mg | ORAL_TABLET | Freq: Every evening | ORAL | Status: DC
  Administered 2020-11-16: 5 mg via ORAL
  Filled 2020-11-16: qty 1

## 2020-11-16 MED ORDER — ACETAMINOPHEN 650 MG RE SUPP: 650.0000 mg | Freq: Four times a day (QID) | RECTAL | Status: DC | PRN

## 2020-11-16 MED ORDER — MUPIROCIN CALCIUM 2 % EX CREA
TOPICAL_CREAM | Freq: Every day | CUTANEOUS | Status: DC
  Filled 2020-11-16: qty 15

## 2020-11-16 MED ORDER — METOPROLOL TARTRATE 5 MG/5ML IV SOLN: 5.0000 mg | Freq: Four times a day (QID) | INTRAVENOUS | Status: DC | PRN

## 2020-11-16 MED ORDER — MAGNESIUM SULFATE 4 GM/100ML IV SOLN
4.0000 g | Freq: Once | INTRAVENOUS | Status: AC
  Administered 2020-11-16: 4 g via INTRAVENOUS
  Filled 2020-11-16: qty 100

## 2020-11-16 MED ORDER — SODIUM CHLORIDE 0.9 % IV BOLUS
500.0000 mL | Freq: Once | INTRAVENOUS | Status: AC
  Administered 2020-11-16: 500 mL via INTRAVENOUS

## 2020-11-16 MED ORDER — POLYETHYLENE GLYCOL 3350 17 G PO PACK: 17.0000 g | PACK | Freq: Every day | ORAL | Status: DC | PRN

## 2020-11-16 NOTE — ED Notes (Signed)
Attempted IV access times one- Additional RN attempted IV access times 2- Unsuccessful. Charge notified need for [place] IV

## 2020-11-16 NOTE — ED Provider Notes (Signed)
[place] DEPT Provider Note   CSN: [id number] Arrival date & time: 11/16/20  1057     History Chief Complaint  Patient presents with  . Abnormal Lab    ALICESON VREDENBURG is a 85 y.o. female.  HPI    85 yo female with history of lower gi bleeding with colon adenoma, fb Dr. Oralia Rud, presents today with reports of abnormal labs.  Patient followed by Dr. [name] , labs sent last week and called and told to come to ED.  Patient presented to [facility] ED 12/29 but lwbs due to long wait times.  She denies symptoms although states she is very weak and is having difficulty walking due to generalized weakness.  She denies lateralized symptoms.  No chest pain, dyspnea, or syncope. Patient rports dr. Sabra Heck stopped losartan last week.  She is not taking aspirin. She has noted some rectal bleeding until hospitalization in November, denies any visible blood since. Covid vaccine and booster 10/28 Patient daughter Kristine Linea [phone] Patient requests contact and info to daughter  Past Medical History:  Diagnosis Date  . Arthritis   . GERD (gastroesophageal reflux disease)   . H/O hiatal hernia   . Hyperlipidemia   . Hypertension   . Hypothyroidism   . Irritable bowel syndrome   . Macular degeneration    Exudative - OD    Patient Active Problem List   Diagnosis Date Noted  . Lower GI bleed 09/30/2020  . Hyperkalemia 09/30/2020  . Failure to thrive in adult 09/15/2020  . Hypocalcemia 04/25/2019  . Cerebral thrombosis with transient ischemic attack (TIA) 04/25/2019  . Snoring 03/17/2019  . Nausea alone 05/31/2014  . Malnutrition of moderate degree ([place]) 05/30/2014  . Weakness 05/29/2014  . Tachycardia 05/29/2014  . HNP (herniated nucleus pulposus), lumbar 05/25/2014  . Electrolyte abnormality 05/12/2014  . Hypokalemia 05/12/2014  . Hyponatremia 05/12/2014  . Lumbosacral radiculopathy 05/12/2014  . Hematuria 05/12/2014  . Benign  essential hypertension 05/12/2014  . Hyperlipidemia LDL goal <100 05/12/2014  . Hypothyroid 05/12/2014    Past Surgical History:  Procedure Laterality Date  . BIOPSY  09/17/2020   Procedure: BIOPSY;  Surgeon: Clarene Essex, MD;  Location: [place];  Service: Endoscopy;;  . CATARACT EXTRACTION    . COLONOSCOPY WITH PROPOFOL N/A 09/17/2020   Procedure: COLONOSCOPY WITH PROPOFOL;  Surgeon: Clarene Essex, MD;  Location: [place];  Service: Endoscopy;  Laterality: N/A;  . ESOPHAGOGASTRODUODENOSCOPY (EGD) WITH PROPOFOL N/A 09/17/2020   Procedure: ESOPHAGOGASTRODUODENOSCOPY (EGD) WITH PROPOFOL;  Surgeon: Clarene Essex, MD;  Location: [place];  Service: Endoscopy;  Laterality: N/A;  . EYE SURGERY Bilateral    cataract surgery w/ lens implant  . FRACTURE SURGERY     right hip  . LUMBAR LAMINECTOMY/DECOMPRESSION MICRODISCECTOMY Left 05/25/2014   Procedure: LUMBAR LAMINECTOMY/DECOMPRESSION MICRODISCECTOMY 1 LEVEL   lumbar three;  Surgeon: Hosie Spangle, MD;  Location: [place] [facility]UR[facility]S;  Service: Neurosurgery;  Laterality: Left;  . SUBMUCOSAL TATTOO INJECTION  09/17/2020   Procedure: SUBMUCOSAL TATTOO INJECTION;  Surgeon: Clarene Essex, MD;  Location: [place];  Service: Endoscopy;;  . THYROID SURGERY       OB History   No obstetric history on file.     Family History  Problem Relation Age of Onset  . Macular degeneration Mother     Social History   Tobacco Use  . Smoking status: Never Smoker  . Smokeless tobacco: Never Used  Vaping Use  . Vaping Use: Never used  Substance Use  Topics  . Alcohol use: No  . Drug use: Never    Home Medications Prior to Admission medications   Medication Sig Start Date End Date Taking? Authorizing Provider  alendronate (FOSAMAX) 70 MG tablet Take 70 mg by mouth once a week. 09/13/20   [provider]  aspirin EC 81 MG EC tablet Take 1 tablet (81 mg total) by mouth daily. 04/27/19   Geradine Girt, DO  escitalopram (LEXAPRO) 10 MG  tablet Take 10 mg by mouth daily.    [provider]  Ferrous Sulfate (IRON PO) Take 1 tablet by mouth daily.    [provider]  gabapentin (NEURONTIN) 300 MG capsule Take 3 capsules (900 mg total) by mouth at bedtime. Patient taking differently: Take 300 mg by mouth at bedtime.  08/14/20   Frann Rider, NP  hydrALAZINE (APRESOLINE) 50 MG tablet TAKE 1 TABLET BY MOUTH 3  TIMES DAILY Patient taking differently: Take 50 mg by mouth in the morning and at bedtime.  01/11/20   Belva Crome, MD  levothyroxine (SYNTHROID) 100 MCG tablet Take 100 mcg by mouth daily before breakfast. 08/21/20   [provider]  losartan (COZAAR) 100 MG tablet Take 1 tablet (100 mg total) by mouth daily. Patient taking differently: Take 100 mg by mouth every evening.  04/29/19   Geradine Girt, DO  metoprolol tartrate (LOPRESSOR) 25 MG tablet Take 1 tablet (25 mg total) by mouth 2 (two) times daily. Patient not taking: Reported on 09/30/2020 09/19/20   Mckinley Jewel, MD  Multiple Vitamin (MULTIVITAMIN WITH MINERALS) TABS tablet Take 1 tablet by mouth daily.    [provider]  ondansetron (ZOFRAN) 4 MG tablet Take 4 mg by mouth daily as needed for nausea or vomiting.  05/10/20   [provider]  pantoprazole (PROTONIX) 40 MG tablet Take 40 mg by mouth daily as needed (indigestion/acid reflux/stomach pain).     [provider]  rosuvastatin (CRESTOR) 5 MG tablet Take 5 mg by mouth every evening.     [provider]    Allergies    Sulfa antibiotics and Carvedilol  Review of Systems   Review of Systems  Constitutional: Positive for activity change and unexpected weight change.  HENT: Negative.   Eyes: Negative.   Respiratory: Negative.   Cardiovascular: Negative.   Gastrointestinal: Negative.   Endocrine: Negative.   Genitourinary: Negative.   Musculoskeletal: Negative.   Skin: Positive for rash.  Allergic/Immunologic: Negative.   Neurological:  Negative.   Hematological: Bruises/bleeds easily.  Psychiatric/Behavioral: Negative.   All other systems reviewed and are negative.   Physical Exam Updated Vital Signs BP 132/60   Pulse (!) 102   Temp 98 F (36.7 C) (Oral)   Resp 18   Wt 58.5 kg   SpO2 100%   BMI 22.14 kg/m   Physical Exam Vitals and nursing note reviewed.  Constitutional:      General: She is not in acute distress.    Appearance: Normal appearance. She is ill-appearing.  HENT:     Head: Normocephalic.     Right Ear: External ear normal.     Left Ear: External ear normal.     Nose: Nose normal.     Mouth/Throat:     Mouth: Mucous membranes are moist.  Eyes:     Comments: Conjunctiva pale  Cardiovascular:     Rate and Rhythm: Normal rate.     Pulses: Normal pulses.     Heart sounds: Normal heart  sounds.  Pulmonary:     Effort: Pulmonary effort is normal.     Breath sounds: Normal breath sounds.  Abdominal:     General: Abdomen is flat. Bowel sounds are normal.     Palpations: Abdomen is soft.  Musculoskeletal:        General: Swelling present. No tenderness.     Cervical back: Normal range of motion.     Comments: Dressing in place left hand patient reports skin tear  Skin:    Findings: Bruising present.  Neurological:     General: No focal deficit present.     Mental Status: She is alert.  Psychiatric:        Mood and Affect: Mood normal.     ED Results / Procedures / Treatments   Labs (all labs ordered are listed, but only abnormal results are displayed) Labs Reviewed - No data to display  EKG EKG Interpretation  Date/Time:  Friday November 16 2020 11:54:45 EST Ventricular Rate:  93 PR Interval:    QRS Duration: 90 QT Interval:  371 QTC Calculation: 462 R Axis:   57 Text Interpretation: Sinus rhythm Atrial premature complex Confirmed by Margarita Grizzle [phone]) on 11/16/2020 1:25:02 PM   Radiology DG Chest Port 1 View  Result Date: 11/16/2020 CLINICAL DATA:  Weakness,  abnormal labs. EXAM: PORTABLE CHEST 1 VIEW COMPARISON:  Chest x-Maraya Gwilliam dated 09/30/2020. FINDINGS: Central pulmonary vascular congestion. LEFT basilar opacity, suspected layering pleural effusion. Probable small RIGHT pleural effusion. No pneumothorax is seen. No acute appearing osseous abnormality. Atherosclerosis of the aortic arch. IMPRESSION: 1. Central pulmonary vascular congestion suggesting CHF/volume overload. 2. New LEFT basilar opacity, most likely small-to-moderate layering pleural effusion with associated atelectasis. Underlying pneumonia cannot be excluded. 3. Probable small RIGHT pleural effusion. 4. Aortic atherosclerosis. Electronically Signed   By: Bary Richard M.D.   On: 11/16/2020 11:56    Procedures Procedures (including critical care time)  Medications Ordered in ED Medications - No data to display  ED Course  I have reviewed the triage vital signs and the nursing notes.  Pertinent labs & imaging results that were available during my care of the patient were reviewed by me and considered in my medical decision making (see chart for details).    MDM Rules/Calculators/A&P                          85 year old female history of lower GI bleed, hypertension, presents today with known lab abnormalities.  She is generally weak and anemia, hypokalemia, and acute kidney injury.  Chest x-[place] with question of basilar opacity.  However, patient is not febrile or having cough or dyspnea here.  She is being rehydrated, she was typed and crossed for 1 unit of packed red blood cells.  Potassium is being repleted.  Plan admission for ongoing evaluation and treatment. Discussed with Dr.  Final Clinical Impression(s) / ED Diagnoses Final diagnoses:  None    Rx / DC Orders ED Discharge Orders    None       Margarita Grizzle, MD 11/17/20 1524

## 2020-11-16 NOTE — H&P (Addendum)
History and Physical        Hospital Admission Note Date: 11/16/2020  Patient name: Tammy Allison Medical record number: [id number] Date of birth: [birth year] Age: 85 y.o. Gender: female  PCP: [name], MD  Patient coming from: home Lives with: husband At baseline, ambulates: walker  Chief Complaint    Chief Complaint  Patient presents with  . Abnormal Lab      HPI:   This is an 85 year old female who has been vaccinated against Covid with past medical history of hypertension, HFpEF, SVT, hyperlipidemia, hypothyroidism, GI bleed, TIA, colonic polyps with high-grade dysplasia and sigmoid stricture, multiple hospitalizations over the past several months for symptomatic anemia, GI bleed and failure to thrive who presented to the ED today for abnormal lab work.  She states that her overall health and wellbeing has been declining over the past several weeks prompting her to go to her PCP.  PCP obtained basic lab work and patient was sent to the [facility] ED on 12/29 for abnormal labs but she left due to long wait times.  She has since had some difficulty ambulating due to generalized weakness and has been using her walker.  States that her health has been declining ever since her fall back in July but has been acutely worsened over the past several weeks.  Also states that she has had several falls recently due to her weakness.  States that her husband tried to help her by grabbing her arm but she suffered a skin tear of her left arm.  She states she also injured her right ankle while getting out of the shower several days ago.  Additionally, she states that her stool has been darker than usual as of recent.  She denies any specific complaints of chest pain, fever, chills, night sweats, nausea, vomiting.  ED Course: Afebrile, hemodynamically stable, on room air. Notable Labs: Sodium 140,  K2.6, BUN 39, creatinine 2.21, albumin 1.7, WBC 8.5, Hb 7.6, platelets 286, COVID-19 pending. Notable Imaging: CXR-central pulmonary vascular congestion suggesting CHF, new left basilar opacity likely small to moderate layering pleural effusion with atelectasis however pneumonia cannot be excluded, probable small right pleural effusion, aortic atherosclerosis. Patient received 500 cc NS bolus, KCl, 1 unit PRBCs   Vitals:   11/16/20 1530 11/16/20 1652  BP: (!) 148/71 (!) 172/86  Pulse: 88 95  Resp: 20 16  Temp:  (!) 97.5 F (36.4 C)  SpO2: 96% 97%     Review of Systems:  Review of Systems  All other systems reviewed and are negative.   Medical/Social/Family History   Past Medical History: Past Medical History:  Diagnosis Date  . Arthritis   . GERD (gastroesophageal reflux disease)   . H/O hiatal hernia   . Hyperlipidemia   . Hypertension   . Hypothyroidism   . Irritable bowel syndrome   . Macular degeneration    Exudative - OD    Past Surgical History:  Procedure Laterality Date  . BIOPSY  09/17/2020   Procedure: BIOPSY;  Surgeon: Clarene Essex, MD;  Location: [place];  Service: Endoscopy;;  . CATARACT EXTRACTION    . COLONOSCOPY WITH PROPOFOL N/A 09/17/2020   Procedure: COLONOSCOPY WITH PROPOFOL;  Surgeon: Watt Climes,  Altamese Dilling, MD;  Location: [place];  Service: Endoscopy;  Laterality: N/A;  . ESOPHAGOGASTRODUODENOSCOPY (EGD) WITH PROPOFOL N/A 09/17/2020   Procedure: ESOPHAGOGASTRODUODENOSCOPY (EGD) WITH PROPOFOL;  Surgeon: Clarene Essex, MD;  Location: [place];  Service: Endoscopy;  Laterality: N/A;  . EYE SURGERY Bilateral    cataract surgery w/ lens implant  . FRACTURE SURGERY     right hip  . LUMBAR LAMINECTOMY/DECOMPRESSION MICRODISCECTOMY Left 05/25/2014   Procedure: LUMBAR LAMINECTOMY/DECOMPRESSION MICRODISCECTOMY 1 LEVEL   lumbar three;  Surgeon: Hosie Spangle, MD;  Location: [place] NEURO ORS;  Service: Neurosurgery;  Laterality: Left;  . SUBMUCOSAL TATTOO  INJECTION  09/17/2020   Procedure: SUBMUCOSAL TATTOO INJECTION;  Surgeon: Clarene Essex, MD;  Location: [place];  Service: Endoscopy;;  . THYROID SURGERY      Medications: Prior to Admission medications   Medication Sig Start Date End Date Taking? Authorizing Provider  escitalopram (LEXAPRO) 10 MG tablet Take 10 mg by mouth daily.   Yes [provider]  Ferrous Sulfate (IRON PO) Take 1 tablet by mouth daily.   Yes [provider]  gabapentin (NEURONTIN) 300 MG capsule Take 3 capsules (900 mg total) by mouth at bedtime. Patient taking differently: Take 300 mg by mouth at bedtime. 08/14/20  Yes McCue, Janett Billow, NP  hydrALAZINE (APRESOLINE) 50 MG tablet TAKE 1 TABLET BY MOUTH 3  TIMES DAILY Patient taking differently: Take 50 mg by mouth in the morning and at bedtime. 01/11/20  Yes Belva Crome, MD  levothyroxine (SYNTHROID) 100 MCG tablet Take 100 mcg by mouth daily before breakfast. 08/21/20  Yes [provider]  Multiple Vitamin (MULTIVITAMIN WITH MINERALS) TABS tablet Take 1 tablet by mouth daily.   Yes [provider]  pantoprazole (PROTONIX) 40 MG tablet Take 40 mg by mouth daily as needed (indigestion/acid reflux/stomach pain).    Yes [provider]  rosuvastatin (CRESTOR) 5 MG tablet Take 5 mg by mouth every evening.    Yes [provider]  aspirin EC 81 MG EC tablet Take 1 tablet (81 mg total) by mouth daily. Patient not taking: Reported on 11/16/2020 04/27/19   Geradine Girt, DO  ondansetron [facility]) 4 MG tablet Take 4 mg by mouth daily as needed for nausea or vomiting.  05/10/20   [provider]    Allergies:   Allergies  Allergen Reactions  . Sulfa Antibiotics Hives  . Carvedilol Rash    Social History:  reports that she has never smoked. She has never used smokeless tobacco. She reports that she does not drink alcohol and does not use drugs.  Family History: Family History  Problem Relation Age of Onset  .  Macular degeneration Mother      Objective   Physical Exam: Blood pressure (!) 172/86, pulse 95, temperature (!) 97.5 F (36.4 C), temperature source Oral, resp. rate 16, height 5\' 4"  (1.626 m), weight 63.5 kg, SpO2 97 %.  Physical Exam Vitals and nursing note reviewed. Exam conducted with a chaperone present.  Constitutional:      Appearance: Normal appearance.  HENT:     Head: Normocephalic and atraumatic.  Eyes:     Conjunctiva/sclera: Conjunctivae normal.  Cardiovascular:     Rate and Rhythm: Normal rate and regular rhythm.     Comments: Heart rate briefly spiked up to 150s on telemetry then returned back to 90s for several seconds.  Patient was asymptomatic during this time Pulmonary:     Effort: Pulmonary effort is normal.     Breath  sounds: Normal breath sounds.  Abdominal:     General: Abdomen is flat.     Palpations: Abdomen is soft.  Musculoskeletal:        General: Signs of injury present.     Right lower leg: Edema present.     Left lower leg: Edema present.     Comments: 1+ bilateral lower extremity edema LUE edema noted Left dorsal hand with purulent skin tear/rash Right medial ankle with purulent and erythematous rash  Skin:    Coloration: Skin is not jaundiced or pale.  Neurological:     Mental Status: She is alert. Mental status is at baseline.  Psychiatric:        Mood and Affect: Mood normal.        Behavior: Behavior normal.    (Right medial knee)     (Left hand)   LABS on Admission: I have personally reviewed all the labs and imaging below    Basic Metabolic Panel: Recent Labs  Lab 11/14/20 1956 11/16/20 1202 11/16/20 1430  NA 141 140  --   K 2.5* 2.6*  --   CL 108 108  --   CO2 24 24  --   GLUCOSE 150* 102*  --   BUN 35* 39*  --   CREATININE 2.33* 2.21*  --   CALCIUM 6.8* 6.8*  --   MG  --   --  1.4*   Liver Function Tests: Recent Labs  Lab 11/14/20 1956 11/16/20 1202  AST 23 22  ALT 17 18  ALKPHOS 82 83  BILITOT 0.3  0.4  PROT 4.9* 5.2*  ALBUMIN 1.4* 1.7*   No results for input(s): LIPASE, AMYLASE in the last 168 hours. No results for input(s): AMMONIA in the last 168 hours. CBC: Recent Labs  Lab 11/14/20 1956 11/16/20 1202  WBC 8.5 8.5  NEUTROABS 6.3  --   HGB 7.1* 7.6*  HCT 23.0* 24.0*  MCV 98.3 98.4  PLT 303 286   Cardiac Enzymes: No results for input(s): CKTOTAL, CKMB, CKMBINDEX, TROPONINI in the last 168 hours. BNP: Invalid input(s): POCBNP CBG: No results for input(s): GLUCAP in the last 168 hours.  Radiological Exams on Admission:  DG Chest Port 1 View  Result Date: 11/16/2020 CLINICAL DATA:  Weakness, abnormal labs. EXAM: PORTABLE CHEST 1 VIEW COMPARISON:  Chest x-ray dated 09/30/2020. FINDINGS: Central pulmonary vascular congestion. LEFT basilar opacity, suspected layering pleural effusion. Probable small RIGHT pleural effusion. No pneumothorax is seen. No acute appearing osseous abnormality. Atherosclerosis of the aortic arch. IMPRESSION: 1. Central pulmonary vascular congestion suggesting CHF/volume overload. 2. New LEFT basilar opacity, most likely small-to-moderate layering pleural effusion with associated atelectasis. Underlying pneumonia cannot be excluded. 3. Probable small RIGHT pleural effusion. 4. Aortic atherosclerosis. Electronically Signed   By: Bary RichardStan  Maynard M.D.   On: 11/16/2020 11:56      EKG: normal EKG, normal sinus rhythm, PAC's noted   A & P   Principal Problem:   Failure to thrive in adult Active Problems:   Electrolyte abnormality   Benign essential hypertension   Hyperlipidemia LDL goal <100   Hypothyroid   Aortic atherosclerosis (HCC)   Abnormal laboratory test   Cellulitis   AKI (acute kidney injury) (HCC)   Hypomagnesemia   1. Failure to thrive, multifactorial: Hypokalemia, hypomagnesemia, AKI, debility, anemia and suspected hypothyroidism a. Treat electrolyte abnormalities and abnormal labs b. PT eval c. Dietitian consult d. Receiving 1  unit PRBCs-follow-up e. Poorly controlled in the past-check TSH  2. LUE  and RLE purulent cellulitis a. Tachycardic on presentation b. WOCN c. Doxycycline  3. AKI on CKD 3 a a. Likely from poor p.o. intake and hemodynamic changes.  Possibly component of cardiorenal as CXR is concerning for CHF and she appears volume overloaded b. Hold nephrotoxic agents c. Low-dose Lasix trial  4. HFpEF, concern for acute exacerbation a. Check BNP b. Lasix as above c. Check daily weights and intake/output  5. Acute on chronic anemia with history of GI bleed a. Hemodynamically stable on room air and reported black stools at home b. Getting 1 unit PRBCs in the ED c. Continue home PPI d. FOBT, consider GI consult pending results e. Hold aspirin  6. Hypertension a. Continue hydralazine b. As needed metoprolol  7. Hyperlipidemia a. Continue statin  8. Episode of Tachycardia with History of SVT, resolved a. Started on beta blocker by cardio in the past but this is not on her list b. PRN lopressor for now and monitor on telemetry  9. LUE edema a. Left upper extremity US  10. Hypothyroidism a. Continue home synthroid b. TSH was 19 in October -> Check TSH   11. Aortic atherosclerosis a. Lipid panel    DVT prophylaxis: SCDs   Code Status: DNR  Diet: Regular Family Communication: Admission, patients condition and plan of care including tests being ordered have been discussed with the patient who indicates understanding and agrees with the plan and Code Status. Disposition Plan: The appropriate patient status for this patient is INPATIENT. Inpatient status is judged to be reasonable and necessary in order to provide the required intensity of service to ensure the patient's safety. The patient's presenting symptoms, physical exam findings, and initial radiographic and laboratory data in the context of their chronic comorbidities is felt to place them at high risk for further clinical  deterioration. Furthermore, it is not anticipated that the patient will be medically stable for discharge from the hospital within 2 midnights of admission. The following factors support the patient status of inpatient.   " The patient's presenting symptoms include failure to thrive. " The worrisome physical exam findings include cellulitis. " The initial radiographic and laboratory data are worrisome because of hypokalemia, hypomagnesemia, AKI, anemia. " The chronic co-morbidities include GI bleed, HFpEF, CKD 3 a, hypothyroidism.   * I certify that at the point of admission it is my clinical judgment that the patient will require inpatient hospital care spanning beyond 2 midnights from the point of admission due to high intensity of service, high risk for further deterioration and high frequency of surveillance required.*   The medical decision making on this patient was of high complexity and the patient is at high risk for clinical deterioration, therefore this is a level 3  admission.  Consultants  . None  Procedures  . None  Time Spent on Admission: 75 minutes    Jae Dire, DO [facility]  11/16/2020, 5:06 PM

## 2020-11-16 NOTE — ED Triage Notes (Signed)
Pt arrives [facility] from home with complaints of abnormal labs per PCP. Pt seen at [facility] ED on 12/29 for the same, pt LWBS due to the wait time. Pt states " My PCP said my kidneys and potassium was off" Pt denies any symptoms. Pt is alert and oriented at this time.

## 2020-11-16 NOTE — Consult Note (Signed)
WOC Nurse Consult Note: Patient receiving care at [phone] Consult completed remotely after review of chart and photos Reason for Consult: "LUE and RLE wounds with surrounding cellulitis" Wound type: Full thickness skin tears right proximal medial LE and Left hand Pressure Injury POA: NA Measurement: Deferred Wound bed: RLE: Red purple with skin torn and brown drainage on dressing Left hand denuded with yellow drainage Dressing procedure/placement/frequency: Gently clean both wounds with soap and water, pat dry. Apply Bactroban and cover with petrolatum based gauze [facility] [place] # [id number]) and 4 x 4 and wrap with kerlix to avoid tape on the skin. Change daily.   Monitor the wound area(s) for worsening of condition such as: Signs/symptoms of infection, increase in size, development of or worsening of odor, development of pain, or increased pain at the affected locations.   Notify the medical team if any of these develop.  Thank you for the consult. WOC nurse will not follow at this time.   Please re-consult the WOC team if needed.  Renaldo Reel Katrinka Blazing, MSN, RN, CMSRN, Angus Seller, [facility] Wound Treatment Associate Pager [phone]

## 2020-11-17 DIAGNOSIS — K56699 Other intestinal obstruction unspecified as to partial versus complete obstruction: Secondary | ICD-10-CM | POA: Diagnosis not present

## 2020-11-17 DIAGNOSIS — Z7189 Other specified counseling: Secondary | ICD-10-CM | POA: Diagnosis not present

## 2020-11-17 DIAGNOSIS — R627 Adult failure to thrive: Secondary | ICD-10-CM | POA: Diagnosis not present

## 2020-11-17 DIAGNOSIS — Z515 Encounter for palliative care: Secondary | ICD-10-CM | POA: Diagnosis not present

## 2020-11-17 DIAGNOSIS — Z66 Do not resuscitate: Secondary | ICD-10-CM

## 2020-11-17 LAB — CBC
HCT: 29.6 % — ABNORMAL LOW (ref 36.0–46.0)
Hemoglobin: 9.6 g/dL — ABNORMAL LOW (ref 12.0–15.0)
MCH: 30.9 pg (ref 26.0–34.0)
MCHC: 32.4 g/dL (ref 30.0–36.0)
MCV: 95.2 fL (ref 80.0–100.0)
Platelets: 145 10*3/uL — ABNORMAL LOW (ref 150–400)
RBC: 3.11 MIL/uL — ABNORMAL LOW (ref 3.87–5.11)
RDW: 16.9 % — ABNORMAL HIGH (ref 11.5–15.5)
WBC: 7.5 10*3/uL (ref 4.0–10.5)
nRBC: 0 % (ref 0.0–0.2)

## 2020-11-17 LAB — BASIC METABOLIC PANEL
Anion gap: 8 (ref 5–15)
BUN: 33 mg/dL — ABNORMAL HIGH (ref 8–23)
CO2: 22 mmol/L (ref 22–32)
Calcium: 7 mg/dL — ABNORMAL LOW (ref 8.9–10.3)
Chloride: 111 mmol/L (ref 98–111)
Creatinine, Ser: 2.08 mg/dL — ABNORMAL HIGH (ref 0.44–1.00)
GFR, Estimated: 23 mL/min — ABNORMAL LOW (ref >60)
Glucose, Bld: 90 mg/dL (ref 70–99)
Potassium: 3.5 mmol/L (ref 3.5–5.1)
Sodium: 141 mmol/L (ref 135–145)

## 2020-11-17 LAB — PHOSPHORUS: Phosphorus: 3.4 mg/dL (ref 2.5–4.6)

## 2020-11-17 LAB — MAGNESIUM: Magnesium: 2.1 mg/dL (ref 1.7–2.4)

## 2020-11-17 MED ORDER — POTASSIUM CHLORIDE CRYS ER 20 MEQ PO TBCR
40.0000 meq | EXTENDED_RELEASE_TABLET | Freq: Every day | ORAL | Status: DC
  Administered 2020-11-17 – 2020-11-19 (×3): 40 meq via ORAL
  Filled 2020-11-17 (×3): qty 2

## 2020-11-17 MED ORDER — ENSURE ENLIVE PO LIQD: 237.0000 mL | Freq: Two times a day (BID) | ORAL | Status: DC

## 2020-11-17 NOTE — Progress Notes (Signed)
PROGRESS NOTE    Tammy Allison  [id number] DOB: [birth year] DOA: 11/16/2020 PCP: [name], MD    Brief Narrative:  86 year old female with history of hypertension, SVT, hyperlipidemia, hypothyroidism, chronic GI bleeding, TIA, high-grade dysplasia and sigmoid strictures and multiple recent hospitalizations with failure to thrive presented to the ER with abnormal lab work.  Patient overall in poor health.  40 pound weight loss since last 1 year.  Poor appetite.  Chronic iron deficiency anemia and transfusions.  Recent colonoscopy with hyperplastic polyp and sigmoid stricture and patient declined surgery.  In the emergency room.  Afebrile.  Hemodynamically stable on room air.  Potassium 2.5.  Albumin 1.7.  Hemoglobin 7.6.  Magnesium 1.4.  Chest x-ray with pulmonary vascular congestion.   Assessment & Plan:   Principal Problem:   Failure to thrive in adult Active Problems:   Electrolyte abnormality   Benign essential hypertension   Hyperlipidemia LDL goal <100   Hypothyroid   Aortic atherosclerosis (HCC)   Abnormal laboratory test   Cellulitis   AKI (acute kidney injury) (HCC)   Hypomagnesemia   Acute heart failure with preserved ejection fraction (HFpEF) (HCC)  Acute on chronic anemia of blood loss: Presented with hemoglobin 7.6, symptomatic.  Given 1 unit of PRBC transfusion with appropriate response.  We will continue to monitor.  Failure to thrive/sigmoid stricture and hyperplastic polyp and refusal to surgery/recurrent anemia and severe protein calorie malnutrition/multiple electrolyte abnormalities due to poor oral intake and poor absorption: Replace electrolytes for symptomatic treatment.  Replace magnesium and potassium aggressively and monitor levels. Patient has discussed with her family and is against colectomy so she did not go to see surgeon. With severity of symptoms and advanced debility, patient will benefit with home hospice evaluation. Discussed patient  with palliation, palliative care team consultation and further planning and patient is agreeable. Will request palliative care consultation, possible coordination for hospice care.  Hypertension: Blood pressure stable.  On hydralazine.  AKI on CKD stage IIIa: Renal functions worsening every day.  Hypothyroidism: TSH normal.  Continue home Synthroid.  Hyperlipidemia: Unsure benefits of statin at this stage.  Will discontinue.  Cellulitis left hand: Oral doxycycline trial.   DVT prophylaxis: Place and maintain sequential compression device Start: 11/16/20 1701   Code Status: DNR Family Communication: patient's daughter and son on the phone Family agreeable to hospice. Disposition Plan: Status is: Inpatient  Remains inpatient appropriate because:Inpatient level of care appropriate due to severity of illness   Dispo: The patient is from: Home              Anticipated d/c is to: Home              Anticipated d/c date is: 2 days              Patient currently is not medically stable to d/c.     Consultants:   None  Procedures:   None  Antimicrobials:   None   Subjective: Patient seen and examined.  Today she denies any nausea or vomiting.  She was able to eat some food today. She was wondering why her legs are swollen. We discussed in detail about her condition, her wishes not to go through surgery and put her family through all the misery.  If this is her time she accepts the death.  Objective: Vitals:   12-08-20 0444 Dec 08, 2020 0500 08-Dec-2020 1136 12/08/2020 1410  BP: 134/60  (!) 150/70 (!) 120/56  Pulse: 88  89 90  Resp:  16  20 17   Temp: 98.4 F (36.9 C)  98.1 F (36.7 C) 98 F (36.7 C)  TempSrc: Oral  Oral Oral  SpO2: 93%  96% 95%  Weight:  59.2 kg    Height:        Intake/Output Summary (Last 24 hours) at 11/17/2020 1512 Last data filed at 11/17/2020 1200 Gross per 24 hour  Intake 393 ml  Output 150 ml  Net 243 ml   Filed Weights   11/16/20 1117  11/16/20 1652 11/17/20 0500  Weight: 58.5 kg 63.5 kg 59.2 kg    Examination:  General exam: Chronically sick looking.  Anxious.  Not in any distress.  Thin and frail and debilitated. Respiratory system: Clear to auscultation. Respiratory effort normal.  No added sound. Cardiovascular system: S1 & S2 heard, RRR. No JVD, murmurs, rubs, gallops or clicks.  2+ bilateral leg edema.   Gastrointestinal system: Abdomen is nondistended, soft and nontender. No organomegaly or masses felt. Normal bowel sounds heard. Central nervous system: Alert and oriented. No focal neurological deficits.  Generalized weakness. She has a skin tear on her left dorsum of the hand with surrounding redness.    Data Reviewed: I have personally reviewed following labs and imaging studies  CBC: Recent Labs  Lab 11/14/20 1956 11/16/20 1202 11/16/20 2112 11/17/20 0551  WBC 8.5 8.5  --  7.5  NEUTROABS 6.3  --   --   --   HGB 7.1* 7.6* 9.0* 9.6*  HCT 23.0* 24.0* 28.5* 29.6*  MCV 98.3 98.4  --  95.2  PLT 303 286  --  145*   Basic Metabolic Panel: Recent Labs  Lab 11/14/20 1956 11/16/20 1202 11/16/20 1430 11/16/20 2112 11/17/20 0551 11/17/20 0808  NA 141 140  --  141 141  --   K 2.5* 2.6*  --  3.3* 3.5  --   CL 108 108  --  110 111  --   CO2 24 24  --  22 22  --   GLUCOSE 150* 102*  --  134* 90  --   BUN 35* 39*  --  35* 33*  --   CREATININE 2.33* 2.21*  --  2.17* 2.08*  --   CALCIUM 6.8* 6.8*  --  6.7* 7.0*  --   MG  --   --  1.4*  --   --  2.1  PHOS  --   --   --   --   --  3.4   GFR: Estimated Creatinine Clearance: 17.7 mL/min (A) (by C-G formula based on SCr of 2.08 mg/dL (H)). Liver Function Tests: Recent Labs  Lab 11/14/20 1956 11/16/20 1202  AST 23 22  ALT 17 18  ALKPHOS 82 83  BILITOT 0.3 0.4  PROT 4.9* 5.2*  ALBUMIN 1.4* 1.7*   No results for input(s): LIPASE, AMYLASE in the last 168 hours. No results for input(s): AMMONIA in the last 168 hours. Coagulation Profile: Recent Labs   Lab 11/14/20 1956  INR 1.3*   Cardiac Enzymes: No results for input(s): CKTOTAL, CKMB, CKMBINDEX, TROPONINI in the last 168 hours. BNP (last 3 results) No results for input(s): PROBNP in the last 8760 hours. HbA1C: No results for input(s): HGBA1C in the last 72 hours. CBG: No results for input(s): GLUCAP in the last 168 hours. Lipid Profile: Recent Labs    11/16/20 2112  CHOL 72  HDL 43  LDLCALC 22  TRIG 34  CHOLHDL 1.7   Thyroid Function Tests: Recent Labs  11/16/20 2112  TSH 4.698*   Anemia Panel: No results for input(s): VITAMINB12, FOLATE, FERRITIN, TIBC, IRON, RETICCTPCT in the last 72 hours. Sepsis Labs: No results for input(s): PROCALCITON, LATICACIDVEN in the last 168 hours.  Recent Results (from the past 240 hour(s))  SARS CORONAVIRUS 2 (TAT 6-24 HRS) Nasopharyngeal Nasopharyngeal Swab     Status: None   Collection Time: 11/16/20 12:02 PM   Specimen: Nasopharyngeal Swab  Result Value Ref Range Status   SARS Coronavirus 2 NEGATIVE NEGATIVE Final    Comment: (NOTE) SARS-CoV-2 target nucleic acids are NOT DETECTED.  The SARS-CoV-2 RNA is generally detectable in upper and lower respiratory specimens during the acute phase of infection. Negative results do not preclude SARS-CoV-2 infection, do not rule out co-infections with other pathogens, and should not be used as the sole basis for treatment or other patient management decisions. Negative results must be combined with clinical observations, patient history, and epidemiological information. The expected result is Negative.  Fact Sheet for Patients: SugarRoll.be  Fact Sheet for Healthcare Providers: https://www.woods-mathews.com/  This test is not yet approved or cleared by the [place] FDA and  has been authorized for detection and/or diagnosis of SARS-CoV-2 by FDA under an Emergency Use Authorization (EUA). This EUA will remain  in effect (meaning  this test can be used) for the duration of the COVID-19 declaration under Se ction 564(b)(1) of the Act, 21 U.S.C. section 360bbb-3(b)(1), unless the authorization is terminated or revoked sooner.  Performed at [facility], [place] [address] [place],  [address]          Radiology Studies: DG Chest Port 1 View  Result Date: 11/16/2020 CLINICAL DATA:  Weakness, abnormal labs. EXAM: PORTABLE CHEST 1 VIEW COMPARISON:  Chest x-ray dated 09/30/2020. FINDINGS: Central pulmonary vascular congestion. LEFT basilar opacity, suspected layering pleural effusion. Probable small RIGHT pleural effusion. No pneumothorax is seen. No acute appearing osseous abnormality. Atherosclerosis of the aortic arch. IMPRESSION: 1. Central pulmonary vascular congestion suggesting CHF/volume overload. 2. New LEFT basilar opacity, most likely small-to-moderate layering pleural effusion with associated atelectasis. Underlying pneumonia cannot be excluded. 3. Probable small RIGHT pleural effusion. 4. Aortic atherosclerosis. Electronically Signed   By: Franki Cabot M.D.   On: 11/16/2020 11:56        Scheduled Meds: . doxycycline  100 mg Oral Q12H  . escitalopram  10 mg Oral Daily  . hydrALAZINE  50 mg Oral BID  . levothyroxine  100 mcg Oral QAC breakfast  . mupirocin cream   Topical Daily  . potassium chloride  40 mEq Oral Daily  . sodium chloride flush  3 mL Intravenous Q12H   Continuous Infusions:   LOS: 1 day    Time spent: 40 minutes    Barb Merino, MD [facility] Pager [phone]

## 2020-11-17 NOTE — Evaluation (Signed)
Physical Therapy Evaluation Patient Details Name: Tammy Allison MRN: [id number] DOB: [birth year] Today's Date: 11/17/2020   History of Present Illness  Pt admitted from home with c/o weakness, falls at home and abnormal labs at PCP and dx with multi-factoral FTT in adult - hypokalemia, anemia, AKI, and hypomagnesemia.  PT with hx of hiatal hernia, macular degeneraion, TIA(20), R hip fx, and lumbar lami (15)  Clinical Impression  Pt admitted as above and presenting with functional mobility limitations 2* generalized weakness, ambulatory balance deficits and limited endurance.  Pt discouraged this am but agreeable to participate with PT.  Pt states "it won't help, I'm not going to get any better, they are talking about surgery and I am not putting myself or my family through that".Pt hopes to progress to dc back home with spouse.    Follow Up Recommendations Home health PT    Equipment Recommendations  None recommended by PT    Recommendations for Other Services OT consult     Precautions / Restrictions Precautions Precautions: Fall Restrictions Weight Bearing Restrictions: No      Mobility  Bed Mobility Overal bed mobility: Needs Assistance Bed Mobility: Rolling;Sidelying to Sit;Sit to Supine Rolling: Min assist Sidelying to sit: Mod assist   Sit to supine: Mod assist   General bed mobility comments: cues for log roll technique, assist to bring LEs over EOB and trunk to upright.  Assist to bring bil LEs back onto bed    Transfers Overall transfer level: Needs assistance Equipment used: Rolling walker (2 wheeled) Transfers: Sit to/from Stand Sit to Stand: Min assist;+2 safety/equipment         General transfer comment: cues for LE management and use of UEs to self assist  Ambulation/Gait Ambulation/Gait assistance: Min assist;+2 physical assistance;+2 safety/equipment Gait Distance (Feet): 12 Feet Assistive device: Rolling walker (2 wheeled) Gait  Pattern/deviations: Step-to pattern;Decreased step length - right;Decreased step length - left;Shuffle;Trunk flexed Gait velocity: decr   General Gait Details: cues for posture and position from RW; assist for balance/support and RW management; distance ltd by fatigue  Stairs            Wheelchair Mobility    Modified Rankin (Stroke Patients Only)       Balance Overall balance assessment: Needs assistance Sitting-balance support: No upper extremity supported;Feet supported Sitting balance-Leahy Scale: Fair     Standing balance support: Bilateral upper extremity supported Standing balance-Leahy Scale: Poor                               Pertinent Vitals/Pain Pain Assessment: No/denies pain    Home Living Family/patient expects to be discharged to:: Private residence Living Arrangements: Spouse/significant other Available Help at Discharge: Family;Available 24 hours/day Type of Home: House Home Access: Stairs to enter Entrance Stairs-Rails: Psychiatric nurse of Steps: 4 Home Layout: One level Home Equipment: Walker - 2 wheels;Cane - single point;Shower seat - built in;Bedside commode      Prior Function Level of Independence: Independent with assistive device(s)         Comments: Using RW for ambulation last several weeks 2* increasing weakness     Hand Dominance   Dominant Hand: Right    Extremity/Trunk Assessment   Upper Extremity Assessment Upper Extremity Assessment: Generalized weakness    Lower Extremity Assessment Lower Extremity Assessment: Generalized weakness       Communication   Communication: No difficulties  Cognition Arousal/Alertness: Awake/alert Behavior During Therapy:  Flat affect Overall Cognitive Status: Within Functional Limits for tasks assessed                                        General Comments      Exercises     Assessment/Plan    PT Assessment Patient needs  continued PT services  PT Problem List Decreased strength;Decreased range of motion;Decreased activity tolerance;Decreased mobility;Decreased knowledge of use of DME;Decreased balance       PT Treatment Interventions DME instruction;Gait training;Stair training;Functional mobility training;Therapeutic activities;Therapeutic exercise;Balance training;Patient/family education    PT Goals (Current goals can be found in the Care Plan section)  Acute Rehab PT Goals Patient Stated Goal: Home PT Goal Formulation: With patient Time For Goal Achievement: 12/01/20 Potential to Achieve Goals: Fair    Frequency Min 3X/week   Barriers to discharge        Co-evaluation               AM-PAC PT "6 Clicks" Mobility  Outcome Measure Help needed turning from your back to your side while in a flat bed without using bedrails?: A Little Help needed moving from lying on your back to sitting on the side of a flat bed without using bedrails?: A Lot Help needed moving to and from a bed to a chair (including a wheelchair)?: A Little Help needed standing up from a chair using your arms (e.g., wheelchair or bedside chair)?: A Little Help needed to walk in hospital room?: A Little Help needed climbing 3-5 steps with a railing? : A Lot 6 Click Score: 16    End of Session Equipment Utilized During Treatment: Gait belt Activity Tolerance: Patient tolerated treatment well;Patient limited by fatigue Patient left: in bed;with call bell/phone within reach;with bed alarm set Nurse Communication: Mobility status PT Visit Diagnosis: Difficulty in walking, not elsewhere classified (R26.2);Muscle weakness (generalized) (M62.81)    Time: 1884-1660 PT Time Calculation (min) (ACUTE ONLY): 25 min   Charges:   PT Evaluation $PT Eval Low Complexity: 1 Low          Mauro Kaufmann PT Acute Rehabilitation Services Pager [phone] Office [phone]   Dell Briner 11/17/2020, 1:01 PM

## 2020-11-17 NOTE — Consult Note (Signed)
Consultation Note Date: 11/17/2020   Patient Name: Tammy Allison  DOB: 1935-09-12  MRN: [id number]  Age / Sex: 86 y.o., female  PCP: [name], MD Referring Physician: Barb Merino, MD  Reason for Consultation: Establishing goals of care  HPI/Patient Profile: 86 y.o. female  with past medical history of  hypertension, SVT, hyperlipidemia, hypothyroidism, chronic GI bleeding, TIA, high-grade dysplasia and sigmoid strictures, and multiple recent hospitalizations with failure to thrive  admitted on 11/16/2020 with abnormal lab work. Found to have hgb 7.6 and given 1 U PRBC with good response. Continues to have issues with failure to thrive, sigmoid stricture and hyperplastic polyp, recurrent anemia, severe protein calorie malnutrition, and multiple electrolyte abnormalities due to poor oral intake and poor absorption. Patient has discussed with her family and is against colectomy so she did not go to see surgeon. PMT consulted d/t patient's severity of symptoms and debility for hospice evaluation.   Clinical Assessment and Goals of Care: I have reviewed medical records including EPIC notes, labs and imaging, received report from RN, assessed the patient and then met with patient and her 2 children  to discuss diagnosis prognosis, GOC, EOL wishes, disposition and options.  I introduced Palliative Medicine as specialized medical care for people living with serious illness. It focuses on providing relief from the symptoms and stress of a serious illness. The goal is to improve quality of life for both the patient and the family.  They all share about patient's decline including her weight loss and poor appetite. She is mostly non-ambulatory. Occasionally walks with walker, needs assistance with ADLs. She lives with her 18 year old husband. Daughter lives nearby and son in [place]. Patient shares about her [employer] faith.    We discussed patient's  current illness and what it means in the larger context of patient's on-going co-morbidities.  Natural disease trajectory and expectations at EOL were discussed. Patient tells me "I don't want surgery, I don't want to go through it - I know I'll die without it and that's okay".  I attempted to elicit values and goals of care important to the patient.  It is most important to patient to be home and avoid aggressive medical interventions.   The difference between aggressive medical intervention and comfort care was considered in light of the patient's goals of care. Patient and family all agree to transition to focus on her comfort and not pursue aggressive medical interventions.   Hospice services outpatient were explained and offered. Discussed philosophy and support provided by hospice. Patient and family agreeable. They prefer hospice with a facility in case patient needs it in the future.  Patient denies pain, dyspnea, nausea, or any other discomforts.  Questions and concerns were addressed. The family was encouraged to call with questions or concerns.    Primary Decision Maker PATIENT    SUMMARY OF RECOMMENDATIONS   - home with hospice - TOC referral made - [facility]  Code Status/Advance Care Planning:  DNR  Additional Recommendations (Limitations, Scope, Preferences):  Full Comfort Care  Psycho-social/Spiritual:   Desire for further Chaplaincy support:no  Additional Recommendations: Education on Hospice  Prognosis:   < 3 months  Discharge Planning: Home with Hospice      Primary Diagnoses: Present on Admission: . Abnormal laboratory test . Electrolyte abnormality . Benign essential hypertension . Failure to thrive in adult . Hyperlipidemia LDL goal <100 . Hypothyroid   I have reviewed the medical record, interviewed the patient and  family, and examined the patient. The following aspects are pertinent.  Past Medical  History:  Diagnosis Date  . Arthritis   . GERD (gastroesophageal reflux disease)   . H/O hiatal hernia   . Hyperlipidemia   . Hypertension   . Hypothyroidism   . Irritable bowel syndrome   . Macular degeneration    Exudative - OD   Social History   Socioeconomic History  . Marital status: Married    Spouse name: Not on file  . Number of children: Not on file  . Years of education: Not on file  . Highest education level: Not on file  Occupational History  . Not on file  Tobacco Use  . Smoking status: Never Smoker  . Smokeless tobacco: Never Used  Vaping Use  . Vaping Use: Never used  Substance and Sexual Activity  . Alcohol use: No  . Drug use: Never  . Sexual activity: Not on file  Other Topics Concern  . Not on file  Social History Narrative  . Not on file   Social Determinants of Health   Financial Resource Strain: Not on file  Food Insecurity: Not on file  Transportation Needs: Not on file  Physical Activity: Not on file  Stress: Not on file  Social Connections: Not on file   Family History  Problem Relation Age of Onset  . Macular degeneration Mother    Scheduled Meds: . doxycycline  100 mg Oral Q12H  . escitalopram  10 mg Oral Daily  . hydrALAZINE  50 mg Oral BID  . levothyroxine  100 mcg Oral QAC breakfast  . mupirocin cream   Topical Daily  . potassium chloride  40 mEq Oral Daily  . sodium chloride flush  3 mL Intravenous Q12H   Continuous Infusions: PRN Meds:.acetaminophen **OR** acetaminophen, metoprolol tartrate, pantoprazole, polyethylene glycol Allergies  Allergen Reactions  . Sulfa Antibiotics Hives  . Carvedilol Rash   Review of Systems  Constitutional: Positive for activity change, appetite change, fatigue and unexpected weight change.  Neurological: Positive for weakness.    Physical Exam Constitutional:      General: She is not in acute distress. Pulmonary:     Effort: Pulmonary effort is normal. No respiratory distress.   Skin:    General: Skin is warm and dry.  Neurological:     Mental Status: She is alert and oriented to person, place, and time.  Psychiatric:        Mood and Affect: Mood normal.        Behavior: Behavior normal.     Vital Signs: BP (!) 120/56 (BP Location: Left Arm)   Pulse 90   Temp 98 F (36.7 C) (Oral)   Resp 17   Ht _0  (1.626 m)   Wt 59.2 kg   SpO2 95%   BMI 22.40 kg/m  Pain Scale: 0-10   Pain Score: 0-No pain   SpO2: SpO2: 95 % O2 Device:SpO2: 95 % O2 Flow Rate: .   IO: Intake/output summary:   Intake/Output Summary (Last 24 hours) at 11/17/2020 1520 Last data filed at 11/17/2020 1200 Gross per 24 hour  Intake 393 ml  Output 150 ml  Net 243 ml    LBM: Last BM Date: 11/16/20 Baseline Weight: Weight: 58.5 kg Most recent weight: Weight: 59.2 kg     Palliative Assessment/Data: PPS 20%    Time Total: 45 minutes Greater than 50%  of this time was spent counseling and coordinating care related to the above assessment and  plan.  Juel Burrow, DNP, AGNP-C Palliative Medicine Team [phone] Pager: [phone]

## 2020-11-17 NOTE — Progress Notes (Signed)
Wound care completed. Pt tolerated with no problems.

## 2020-11-18 ENCOUNTER — Inpatient Hospital Stay (HOSPITAL_COMMUNITY): Payer: Medicare Other

## 2020-11-18 DIAGNOSIS — L8990 Pressure ulcer of unspecified site, unspecified stage: Secondary | ICD-10-CM | POA: Insufficient documentation

## 2020-11-18 DIAGNOSIS — R627 Adult failure to thrive: Secondary | ICD-10-CM | POA: Diagnosis not present

## 2020-11-18 DIAGNOSIS — M7989 Other specified soft tissue disorders: Secondary | ICD-10-CM

## 2020-11-18 LAB — BASIC METABOLIC PANEL
Anion gap: 7 (ref 5–15)
BUN: 36 mg/dL — ABNORMAL HIGH (ref 8–23)
CO2: 23 mmol/L (ref 22–32)
Calcium: 7 mg/dL — ABNORMAL LOW (ref 8.9–10.3)
Chloride: 112 mmol/L — ABNORMAL HIGH (ref 98–111)
Creatinine, Ser: 2.19 mg/dL — ABNORMAL HIGH (ref 0.44–1.00)
GFR, Estimated: 22 mL/min — ABNORMAL LOW (ref >60)
Glucose, Bld: 99 mg/dL (ref 70–99)
Potassium: 3.7 mmol/L (ref 3.5–5.1)
Sodium: 142 mmol/L (ref 135–145)

## 2020-11-18 LAB — TYPE AND SCREEN
ABO/RH(D): O POS
Antibody Screen: NEGATIVE
Unit division: 0

## 2020-11-18 LAB — CBC
HCT: 27.2 % — ABNORMAL LOW (ref 36.0–46.0)
Hemoglobin: 8.7 g/dL — ABNORMAL LOW (ref 12.0–15.0)
MCH: 30 pg (ref 26.0–34.0)
MCHC: 32 g/dL (ref 30.0–36.0)
MCV: 93.8 fL (ref 80.0–100.0)
Platelets: 230 10*3/uL (ref 150–400)
RBC: 2.9 MIL/uL — ABNORMAL LOW (ref 3.87–5.11)
RDW: 16.7 % — ABNORMAL HIGH (ref 11.5–15.5)
WBC: 7.4 10*3/uL (ref 4.0–10.5)
nRBC: 0 % (ref 0.0–0.2)

## 2020-11-18 LAB — BPAM RBC
Blood Product Expiration Date: 202202012359
ISSUE DATE / TIME: 202112311401
Unit Type and Rh: 5100

## 2020-11-18 MED ORDER — MORPHINE SULFATE (CONCENTRATE) 10 MG/0.5ML PO SOLN: 10.0000 mg | ORAL | Status: DC | PRN

## 2020-11-18 MED ORDER — LORAZEPAM 2 MG/ML PO CONC: 0.5000 mg | ORAL | Status: DC | PRN

## 2020-11-18 NOTE — TOC Initial Note (Signed)
Transition of Care [facility]) - Initial/Assessment Note    Patient Details  Name: Tammy Allison MRN: [id number] Date of Birth: 1935/08/09  Transition of Care (TOC) CM/SW Contact:    Joaquin Courts, RN Phone Number: 11/18/2020, 11:25 AM  Clinical Narrative:                 CM spoke with patients son and POA Merry Proud and offered hospice choice.  Son Forensic psychologist and reports patient has rolling  Walker at home but will need hospital bed, wheelchair, and 3in1.  [facility] rep Anderson Malta given referral and notified of equipment needs.  Son reports he is on his way to [facility] right now to prepare the home for the hospital bed delivery.  CM is hopeful for an equipment delivery today, but notes that is is highly unlikely.   Expected Discharge Plan: Home w Hospice Care Barriers to Discharge: Continued Medical Work up   Patient Goals and CMS Choice Patient states their goals for this hospitalization and ongoing recovery are:: to go home with hospice CMS Medicare.gov Compare Post Acute Care list provided to:: Patient Represenative (must comment) Choice offered to / list presented to : [facility] POA / [place]  Expected Discharge Plan and Services Expected Discharge Plan: [place]   Discharge Planning Services: CM Consult Post Acute Care Choice: Hospice Living arrangements for the past 2 months: Single Family Home                                      Prior Living Arrangements/Services Living arrangements for the past 2 months: Single Family Home Lives with:: Spouse Patient language and need for interpreter reviewed:: Yes Do you feel safe going back to the place where you live?: Yes      Need for Family Participation in Patient Care: Yes (Comment) Care giver support system in place?: Yes (comment)   Criminal Activity/Legal Involvement Pertinent to Current Situation/Hospitalization: No - Comment as needed  Activities of Daily Living Home Assistive  Devices/Equipment: Walker (specify type) ADL Screening (condition at time of admission) Patient's cognitive ability adequate to safely complete daily activities?: Yes Is the patient deaf or have difficulty hearing?: No Does the patient have difficulty seeing, even when wearing glasses/contacts?: No Does the patient have difficulty concentrating, remembering, or making decisions?: No Patient able to express need for assistance with ADLs?: No Does the patient have difficulty dressing or bathing?: Yes Independently performs ADLs?: No Communication: Independent Dressing (OT): Needs assistance Is this a change from baseline?: Pre-admission baseline Grooming: Needs assistance Is this a change from baseline?: Pre-admission baseline Feeding: Independent Bathing: Needs assistance Is this a change from baseline?: Pre-admission baseline Toileting: Independent In/Out Bed: Needs assistance Is this a change from baseline?: Pre-admission baseline Walks in Home: Needs assistance Is this a change from baseline?: Pre-admission baseline Does the patient have difficulty walking or climbing stairs?: Yes Weakness of Legs: Both Weakness of Arms/Hands: Both  Permission Sought/Granted                  Emotional Assessment Appearance:: Appears stated age Attitude/Demeanor/Rapport: Engaged Affect (typically observed): Accepting Orientation: : Oriented to Self,Oriented to Place,Oriented to  Time,Oriented to Situation   Psych Involvement: No (comment)  Admission diagnosis:  Weakness [R53.1] Abnormal laboratory test [R89.9] Symptomatic anemia [D64.9] Patient Active Problem List   Diagnosis Date Noted  . Pressure injury of skin 11/18/2020  . Aortic  atherosclerosis (HCC) 11/16/2020  . Abnormal laboratory test 11/16/2020  . Cellulitis 11/16/2020  . AKI (acute kidney injury) (HCC) 11/16/2020  . Hypomagnesemia 11/16/2020  . Acute heart failure with preserved ejection fraction (HFpEF) (HCC)  11/16/2020  . Lower GI bleed 09/30/2020  . Hyperkalemia 09/30/2020  . Failure to thrive in adult 09/15/2020  . Hypocalcemia 04/25/2019  . Cerebral thrombosis with transient ischemic attack (TIA) 04/25/2019  . Snoring 03/17/2019  . Nausea alone 05/31/2014  . Malnutrition of moderate degree (HCC) 05/30/2014  . Weakness 05/29/2014  . Tachycardia 05/29/2014  . HNP (herniated nucleus pulposus), lumbar 05/25/2014  . Electrolyte abnormality 05/12/2014  . Hypokalemia 05/12/2014  . Hyponatremia 05/12/2014  . Lumbosacral radiculopathy 05/12/2014  . Hematuria 05/12/2014  . Benign essential hypertension 05/12/2014  . Hyperlipidemia LDL goal <100 05/12/2014  . Hypothyroid 05/12/2014   PCP:  Sigmund Hazel, MD Pharmacy:   [facility] [phone] - [address], [place] - [address] AT [facility] [address] & [facility] [address] [place] [address] Phone: [phone] Fax: [phone]  [facility] - [place], [place] - [address] [place], [address] [place], [address] [place] [address] Phone: [phone] Fax: [phone]     Social Determinants of Health (SDOH) Interventions    Readmission Risk Interventions Readmission Risk Prevention Plan 09/19/2020  Transportation Screening Complete  PCP or Specialist Appt within 5-7 Days Complete  Home Care Screening Complete  Medication Review (RN CM) Complete  Some recent data might be hidden

## 2020-11-18 NOTE — Progress Notes (Signed)
Left upper ext venous completed.   Please see CV Proc for preliminary results.   Clint Guy, RVT

## 2020-11-18 NOTE — Progress Notes (Signed)
PROGRESS NOTE    Tammy Allison  [id number] DOB: 1935/07/17 DOA: 11/16/2020 PCP: [name], MD    Brief Narrative:  86 year old female with history of hypertension, SVT, hyperlipidemia, hypothyroidism, chronic GI bleeding, TIA, high-grade dysplasia and sigmoid strictures and multiple recent hospitalizations with failure to thrive presented to the ER with abnormal lab work.  Patient overall in poor health.  40 pound weight loss since last 1 year.  Poor appetite.  Chronic iron deficiency anemia and transfusions.  Recent colonoscopy with hyperplastic polyp and sigmoid stricture and patient declined surgery.  In the emergency room.  Afebrile.  Hemodynamically stable on room air.  Potassium 2.5.  Albumin 1.7.  Hemoglobin 7.6.  Magnesium 1.4.  Chest x-ray with pulmonary vascular congestion.   Assessment & Plan:   Principal Problem:   Failure to thrive in adult Active Problems:   Electrolyte abnormality   Benign essential hypertension   Hyperlipidemia LDL goal <100   Hypothyroid   Aortic atherosclerosis (HCC)   Abnormal laboratory test   Cellulitis   AKI (acute kidney injury) (HCC)   Hypomagnesemia   Acute heart failure with preserved ejection fraction (HFpEF) (HCC)   Pressure injury of skin  Acute on chronic anemia of blood loss: Presented with hemoglobin 7.6, symptomatic.  Given 1 unit of PRBC transfusion with appropriate response.  We will hold off on further transfusion.  Failure to thrive secondary to sigmoid stricture and hyperplastic polyp and refusal to surgery  Severe protein calorie malnutrition  Multiple electrolyte abnormalities due to poor oral intake and poor absorption  Acute kidney injury on chronic kidney disease stage III AAA  Hypothyroidism and hypertension  Cellulitis left hand: Oral doxycycline trial.  Plan: With overall poor medical condition, difficult to treat condition and patient's desire to not to continue be treated and hospitalized, a comfort  care and hospice desired. All comfort care medications available. Patient currently hemodynamically stable. Able to go home, has support at home. Case management and hospice liaison working on DME supplies and hospice admission at home. No benefits with further blood draws or lab testing.  We will continue supportive treatment.  She can continue to take thyroxine and other essential medications until she has oral intake.  We can also continue electrolyte replacements.   DVT prophylaxis: Place and maintain sequential compression device Start: 11/16/20 1701   Code Status: DNR/comfort care Family Communication: patient's daughter and son on the phone 1/1 Disposition Plan: Status is: Inpatient, home hospice arrangements made.  Remains inpatient appropriate because:Inpatient level of care appropriate due to severity of illness   Dispo: The patient is from: Home              Anticipated d/c is to: Home with home hospice              Anticipated d/c date is: When arrangements are available              Patient currently is not medically stable to d/c.  Able to go home after hospice arrangements are made.     Consultants:   None  Procedures:   None  Antimicrobials:   None   Subjective: Patient seen and examined.  Denies any complaints.  She is sad and asking me whether she made a good decision. Patient tells me that hopefully she will die in peace and not drag on and suffer.  Objective: Vitals:   11/17/20 1136 11/17/20 1410 11/17/20 2016 11/18/20 0558  BP: (!) 150/70 (!) 120/56 (!) 149/77 Marland Kitchen)  157/73  Pulse: 89 90 92 91  Resp: 20 17 20 20   Temp: 98.1 F (36.7 C) 98 F (36.7 C) 98 F (36.7 C) 98.1 F (36.7 C)  TempSrc: Oral Oral Oral Oral  SpO2: 96% 95% 97% 96%  Weight:      Height:        Intake/Output Summary (Last 24 hours) at 11/18/2020 1357 Last data filed at 11/18/2020 1154 Gross per 24 hour  Intake 824 ml  Output 150 ml  Net 674 ml   Filed Weights    11/16/20 1117 11/16/20 1652 11/17/20 0500  Weight: 58.5 kg 63.5 kg 59.2 kg    Examination:  General exam: Chronically sick looking.  Anxious.   Thin and frail and debilitated. Respiratory system: Clear to auscultation. Respiratory effort normal.  No added sound. Cardiovascular system: S1 & S2 heard, RRR. No JVD, murmurs, rubs, gallops or clicks.  2+ bilateral leg edema.   Gastrointestinal system: Abdomen is nondistended, soft and nontender. No organomegaly or masses felt. Normal bowel sounds heard. Central nervous system: Alert and oriented. No focal neurological deficits.  Generalized weakness. She has a skin tear on her left dorsum of the hand, dressing applied.    Data Reviewed: I have personally reviewed following labs and imaging studies  CBC: Recent Labs  Lab 11/14/20 1956 11/16/20 1202 11/16/20 2112 11/17/20 0551 11/18/20 0548  WBC 8.5 8.5  --  7.5 7.4  NEUTROABS 6.3  --   --   --   --   HGB 7.1* 7.6* 9.0* 9.6* 8.7*  HCT 23.0* 24.0* 28.5* 29.6* 27.2*  MCV 98.3 98.4  --  95.2 93.8  PLT 303 286  --  145* 230   Basic Metabolic Panel: Recent Labs  Lab 11/14/20 1956 11/16/20 1202 11/16/20 1430 11/16/20 2112 11/17/20 0551 11/17/20 0808 11/18/20 0548  NA 141 140  --  141 141  --  142  K 2.5* 2.6*  --  3.3* 3.5  --  3.7  CL 108 108  --  110 111  --  112*  CO2 24 24  --  22 22  --  23  GLUCOSE 150* 102*  --  134* 90  --  99  BUN 35* 39*  --  35* 33*  --  36*  CREATININE 2.33* 2.21*  --  2.17* 2.08*  --  2.19*  CALCIUM 6.8* 6.8*  --  6.7* 7.0*  --  7.0*  MG  --   --  1.4*  --   --  2.1  --   PHOS  --   --   --   --   --  3.4  --    GFR: Estimated Creatinine Clearance: 16.8 mL/min (A) (by C-G formula based on SCr of 2.19 mg/dL (H)). Liver Function Tests: Recent Labs  Lab 11/14/20 1956 11/16/20 1202  AST 23 22  ALT 17 18  ALKPHOS 82 83  BILITOT 0.3 0.4  PROT 4.9* 5.2*  ALBUMIN 1.4* 1.7*   No results for input(s): LIPASE, AMYLASE in the last 168 hours. No  results for input(s): AMMONIA in the last 168 hours. Coagulation Profile: Recent Labs  Lab 11/14/20 1956  INR 1.3*   Cardiac Enzymes: No results for input(s): CKTOTAL, CKMB, CKMBINDEX, TROPONINI in the last 168 hours. BNP (last 3 results) No results for input(s): PROBNP in the last 8760 hours. HbA1C: No results for input(s): HGBA1C in the last 72 hours. CBG: No results for input(s): GLUCAP in the last 168 hours.  Lipid Profile: Recent Labs    11/16/20 2112  CHOL 72  HDL 43  LDLCALC 22  TRIG 34  CHOLHDL 1.7   Thyroid Function Tests: Recent Labs    11/16/20 2112  TSH 4.698*   Anemia Panel: No results for input(s): VITAMINB12, FOLATE, FERRITIN, TIBC, IRON, RETICCTPCT in the last 72 hours. Sepsis Labs: No results for input(s): PROCALCITON, LATICACIDVEN in the last 168 hours.  Recent Results (from the past 240 hour(s))  SARS CORONAVIRUS 2 (TAT 6-24 HRS) Nasopharyngeal Nasopharyngeal Swab     Status: None   Collection Time: 11/16/20 12:02 PM   Specimen: Nasopharyngeal Swab  Result Value Ref Range Status   SARS Coronavirus 2 NEGATIVE NEGATIVE Final    Comment: (NOTE) SARS-CoV-2 target nucleic acids are NOT DETECTED.  The SARS-CoV-2 RNA is generally detectable in upper and lower respiratory specimens during the acute phase of infection. Negative results do not preclude SARS-CoV-2 infection, do not rule out co-infections with other pathogens, and should not be used as the sole basis for treatment or other patient management decisions. Negative results must be combined with clinical observations, patient history, and epidemiological information. The expected result is Negative.  Fact Sheet for Patients: SugarRoll.be  Fact Sheet for Healthcare Providers: https://www.woods-mathews.com/  This test is not yet approved or cleared by the [place] FDA and  has been authorized for detection and/or diagnosis of SARS-CoV-2 by FDA  under an Emergency Use Authorization (EUA). This EUA will remain  in effect (meaning this test can be used) for the duration of the COVID-19 declaration under Se ction 564(b)(1) of the Act, 21 U.S.C. section 360bbb-3(b)(1), unless the authorization is terminated or revoked sooner.  Performed at [facility], [place] [address], [place], [place] [address]          Radiology Studies: VAS [place] UPPER EXTREMITY VENOUS DUPLEX  Result Date: 11/18/2020 UPPER VENOUS STUDY  Indications: Swelling Comparison Study: No previous Performing Technologist: Vonzell Schlatter RVT  Examination Guidelines: A complete evaluation includes B-mode imaging, spectral Doppler, color Doppler, and power Doppler as needed of all accessible portions of each vessel. Bilateral testing is considered an integral part of a complete examination. Limited examinations for reoccurring indications may be performed as noted.  Right Findings: +----------+------------+---------+-----------+----------+-------+ RIGHT     CompressiblePhasicitySpontaneousPropertiesSummary +----------+------------+---------+-----------+----------+-------+ IJV           Full       Yes       Yes                      +----------+------------+---------+-----------+----------+-------+ Subclavian    Full       Yes       Yes                      +----------+------------+---------+-----------+----------+-------+ Axillary      Full       Yes       Yes                      +----------+------------+---------+-----------+----------+-------+ Brachial      Full       Yes       Yes                      +----------+------------+---------+-----------+----------+-------+ Radial        Full                                          +----------+------------+---------+-----------+----------+-------+  Ulnar         Full                                          +----------+------------+---------+-----------+----------+-------+ Cephalic      Full                                           +----------+------------+---------+-----------+----------+-------+ Basilic       Full                                          +----------+------------+---------+-----------+----------+-------+  Summary:  Left: No evidence of deep vein thrombosis in the upper extremity. No evidence of superficial vein thrombosis in the upper extremity.  *See table(s) above for measurements and observations.    Preliminary         Scheduled Meds: . doxycycline  100 mg Oral Q12H  . escitalopram  10 mg Oral Daily  . feeding supplement  237 mL Oral BID BM  . hydrALAZINE  50 mg Oral BID  . levothyroxine  100 mcg Oral QAC breakfast  . mupirocin cream   Topical Daily  . potassium chloride  40 mEq Oral Daily  . sodium chloride flush  3 mL Intravenous Q12H   Continuous Infusions:   LOS: 2 days    Time spent: 30 minutes    Barb Merino, MD [facility] Pager [phone]

## 2020-11-18 NOTE — Progress Notes (Signed)
[facility])  Referral received for hospice at home.   Spoke with son, who is NOK, explained services and offered support.  He is currently coming in town to have a conversation with his sister and his father to explain to their father the decisions being made to best care for their mother.  DME discussed, [facility] will order bed, 3n1, WC.    [facility] will update once DME is set up.  Wallis Bamberg RN, BSN, CCRN [facility] Liaison

## 2020-11-18 NOTE — Progress Notes (Signed)
Nutrition Brief Note  RD consulted for assessment of nutritional status.  Chart reviewed. Pt now transitioning to comfort care with plans to discharge home with hospice.   No nutrition interventions warranted at this time.  Please re-consult as needed.   Lars Masson, RD, LDN Clinical Nutrition After Hours/Weekend Pager # in Amion

## 2020-11-19 DIAGNOSIS — E039 Hypothyroidism, unspecified: Secondary | ICD-10-CM | POA: Diagnosis not present

## 2020-11-19 DIAGNOSIS — R627 Adult failure to thrive: Secondary | ICD-10-CM | POA: Diagnosis not present

## 2020-11-19 DIAGNOSIS — N179 Acute kidney failure, unspecified: Secondary | ICD-10-CM | POA: Diagnosis not present

## 2020-11-19 DIAGNOSIS — N183 Chronic kidney disease, stage 3 unspecified: Secondary | ICD-10-CM

## 2020-11-19 DIAGNOSIS — D649 Anemia, unspecified: Secondary | ICD-10-CM

## 2020-11-19 DIAGNOSIS — N1831 Chronic kidney disease, stage 3a: Secondary | ICD-10-CM

## 2020-11-19 DIAGNOSIS — I5032 Chronic diastolic (congestive) heart failure: Secondary | ICD-10-CM

## 2020-11-19 DIAGNOSIS — L0390 Cellulitis, unspecified: Secondary | ICD-10-CM | POA: Diagnosis not present

## 2020-11-19 MED ORDER — MUPIROCIN CALCIUM 2 % EX CREA: TOPICAL_CREAM | Freq: Every day | CUTANEOUS | 0 refills | Status: AC

## 2020-11-19 MED ORDER — ACETAMINOPHEN 325 MG PO TABS: 650.0000 mg | ORAL_TABLET | Freq: Four times a day (QID) | ORAL | Status: AC | PRN

## 2020-11-19 MED ORDER — DOXYCYCLINE HYCLATE 100 MG PO TABS
100.0000 mg | ORAL_TABLET | Freq: Two times a day (BID) | ORAL | 0 refills | Status: AC
Start: 2020-11-19 — End: ?

## 2020-11-19 NOTE — Discharge Summary (Addendum)
Physician Discharge Summary  Tammy Allison [id number] DOB: February 19, 1935 DOA: 11/16/2020  PCP: [name], MD  Admit date: 11/16/2020 Discharge date: 11/19/2020  Recommendations for Outpatient Follow-up:  Home with hospice  Continue wound care: Gently clean both wounds with soap and water, pat dry. Apply Bactroban and cover with petrolatum based gauze and 4 x 4 and wrap with kerlix to avoid tape on the skin. Change daily. Monitor the wound area(s) for worsening of  condition such as: Signs/symptoms of infection, increase in size, development of or worsening of odor, development of pain, or increased pain at the affected locations.     Follow-up Information    [name], MD Follow up.   Specialty: Family Medicine Why: as needed Contact information: [name] [address] [phone]                Discharge Diagnoses: Principal diagnosis is #1 Principal Problem:   Failure to thrive in adult Active Problems:   Electrolyte abnormality   Benign essential hypertension   Hyperlipidemia LDL goal <100   Hypothyroid   Lower GI bleed   Aortic atherosclerosis (HCC)   Cellulitis   AKI (acute kidney injury) ([place])   Hypomagnesemia   Acute on chronic diastolic CHF (congestive heart failure) (HCC)   Pressure injury of skin   Symptomatic anemia   CKD (chronic kidney disease), stage III ([place])   Discharge Condition: stable, but prognosis poor Disposition: home with hospice  Diet recommendation:  Diet Orders (From admission, onward)    Start     Ordered   11/16/20 1651  Diet regular Room service appropriate? Yes; Fluid consistency: Thin  Diet effective now       Question Answer Comment  Room service appropriate? 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  Admitted for failure to thrive multifactorial with electrolyte abnormalities, anemia.  Electrolyte abnormalities were repleted, acute issues were treated without significant improvement in renal function.  Hemoglobin did respond to blood transfusion.  Patient seen by palliative medicine.  Given weight loss, failure to thrive, overall worsening condition, recurrent anemia and poor kidney function, patient requested home with hospice.  Continue conservative management, await equipment delivery to home and then discharge home.  Multifactorial failure to thrive including symptomatic anemia, protein calorie malnutrition, poor appetite, with associated hypokalemia, hypomagnesemia, AKI, generalized debility, anemia, severe protein calorie malnutrition --Supportive care based on patient's goals of cares.  Acute on chronic anemia, symptomatic anemia likely secondary to chronic GI blood loss with known high-grade colonic dysplasia and sigmoid stricture. Patient refused surgery. --Status post 1 unit PRBC --Continue PPI --Not on aspirin or anticoagulation --Based on goals of care, no further transfusions.  Purulent skin tear left hand, right ankle cellulitis present on admission --Empiric doxycycline --Continue wound care: Gently clean both wounds with soap and water, pat dry. Apply Bactroban and cover with petrolatum based gauze and 4 x 4 and wrap with kerlix to avoid tape on the skin. Change daily. Monitor the wound area(s) for worsening of  condition such as: Signs/symptoms of  infection, increase in size, development of or worsening of odor, development of pain, or increased pain at the affected locations.    Acute kidney injury superimposed on CKD stage IIIa --Likely from poor oral intake, some  concern for cardiorenal component given volume overload on exam on admission and edema on chest x-ray on admission --Treated with low-dose Lasix --Losartan recently discontinued by PCP. --Supportive care.  Patient aware of worsening kidney function.  Acute on chronic diastolic CHF --Appears euvolemic now.  Lungs are clear anteriorly.  Generalized debility --HH PT recommended but patient refused  Hypothyroidism --TSH w/ trivial elevation. Continue Synthroid w/o change.    Antimicrobials:  . Doxycycline 1/1 > 1/8  Today's assessment: S: CC: f/u FTT  Feels ok, no pain, breathing fine, no complaints. Wants to go home with hospice. Wants to continue full comfort care.  O: Vitals:  Vitals:   11/18/20 2037 11/19/20 0549  BP: (!) 168/86 (!) 175/78  Pulse: (!) 108 (!) 101  Resp: 16 20  Temp: 98.4 F (36.9 C) 98.1 F (36.7 C)  SpO2: 95% 98%    Constitutional:  . Appears calm and comfortable ENMT:  . grossly normal hearing  Respiratory:  . CTA bilaterally, no w/r/r.  . Respiratory effort normal. Cardiovascular:  . RRR, no m/r/g . Telemetry SR . No LE extremity edema   Abdomen:  . Abdomen appears normal Skin:  . Right leg and left hand wound just dressed earlier today Psychiatric:  . judgement and insight appear normal . Mental status o Mood, affect appropriate  No new data  Discharge Instructions   Allergies as of 11/19/2020      Reactions   Sulfa Antibiotics Hives   Carvedilol Rash      Medication List    STOP taking these medications   aspirin 81 MG EC tablet   gabapentin 300 MG capsule Commonly known as: NEURONTIN   multivitamin with minerals Tabs tablet   rosuvastatin 5 MG tablet Commonly known as: CRESTOR     TAKE these medications   acetaminophen 325 MG tablet Commonly known as: TYLENOL Take 2 tablets (650 mg total) by mouth every 6 (six) hours as needed for mild pain (or Fever >/= 101).   doxycycline 100 MG tablet Commonly known as:  VIBRA-TABS Take 1 tablet (100 mg total) by mouth every 12 (twelve) hours.   escitalopram 10 MG tablet Commonly known as: LEXAPRO Take 10 mg by mouth daily.   hydrALAZINE 50 MG tablet Commonly known as: APRESOLINE TAKE 1 TABLET BY MOUTH 3  TIMES DAILY What changed: when to take this   IRON PO Take 1 tablet by mouth daily.   levothyroxine 100 MCG tablet Commonly known as: SYNTHROID Take 100 mcg by mouth daily before breakfast.   mupirocin cream 2 % Commonly known as: BACTROBAN Apply topically daily. Start taking on: November 20, 2020   ondansetron 4 MG tablet Commonly known as: ZOFRAN Take 4 mg by mouth daily as needed for nausea or vomiting.   pantoprazole 40 MG tablet Commonly known as: PROTONIX Take 40 mg by mouth daily as needed (indigestion/acid reflux/stomach pain).      Allergies  Allergen Reactions  . Sulfa Antibiotics Hives  . Carvedilol Rash    The results of significant diagnostics from this hospitalization (including imaging, microbiology, ancillary and laboratory) are listed below for reference.    Significant Diagnostic Studies: DG Chest Port 1 View  Result Date: 11/16/2020 CLINICAL DATA:  Weakness, abnormal labs. EXAM: PORTABLE CHEST 1 VIEW COMPARISON:  Chest x-ray dated 09/30/2020. FINDINGS: Central pulmonary vascular congestion. LEFT basilar opacity, suspected layering pleural effusion. Probable small RIGHT pleural effusion. No pneumothorax is seen. No acute appearing osseous abnormality. Atherosclerosis of the aortic arch. IMPRESSION: 1. Central pulmonary vascular congestion suggesting CHF/volume overload. 2. New LEFT basilar opacity, most likely small-to-moderate layering pleural effusion with associated atelectasis. Underlying pneumonia cannot be excluded. 3. Probable small RIGHT pleural effusion. 4. Aortic atherosclerosis. Electronically Signed   By: Bary Richard M.D.   On: 11/16/2020 11:56   VAS [place] UPPER EXTREMITY VENOUS DUPLEX  Result Date:  11/19/2020 UPPER VENOUS STUDY  Indications: Swelling Comparison Study: No previous Performing Technologist: Clint Guy RVT  Examination Guidelines: A complete evaluation includes B-mode imaging, spectral Doppler, color Doppler, and power Doppler as needed of all accessible portions of each vessel. Bilateral testing is considered an integral part of a complete examination. Limited examinations for reoccurring indications may be performed as noted.  Right Findings: +----------+------------+---------+-----------+----------+-------+ RIGHT     CompressiblePhasicitySpontaneousPropertiesSummary +----------+------------+---------+-----------+----------+-------+ IJV           Full       Yes       Yes                      +----------+------------+---------+-----------+----------+-------+ Subclavian    Full       Yes       Yes                      +----------+------------+---------+-----------+----------+-------+ Axillary      Full       Yes       Yes                      +----------+------------+---------+-----------+----------+-------+ Brachial      Full       Yes       Yes                      +----------+------------+---------+-----------+----------+-------+ Radial        Full                                          +----------+------------+---------+-----------+----------+-------+ Ulnar         Full                                          +----------+------------+---------+-----------+----------+-------+ Cephalic      Full                                          +----------+------------+---------+-----------+----------+-------+ Basilic       Full                                          +----------+------------+---------+-----------+----------+-------+  Summary:  Left: No evidence of deep vein thrombosis in the upper extremity. No evidence of superficial vein thrombosis in the upper extremity.  *See table(s) above for measurements and observations.   Diagnosing physician: Fabienne Bruns MD Electronically signed by Fabienne Bruns MD on 11/19/2020 at 12:11:57 PM.  Final     Microbiology: Recent Results (from the past 240 hour(s))  SARS CORONAVIRUS 2 (TAT 6-24 HRS) Nasopharyngeal Nasopharyngeal Swab     Status: None   Collection Time: 11/16/20 12:02 PM   Specimen: Nasopharyngeal Swab  Result Value Ref Range Status   SARS Coronavirus 2 NEGATIVE NEGATIVE Final    Comment: (NOTE) SARS-CoV-2 target nucleic acids are NOT DETECTED.  The SARS-CoV-2 RNA is generally detectable in upper and lower respiratory specimens during the acute phase of infection. Negative results do not preclude SARS-CoV-2 infection, do not rule out co-infections with other pathogens, and should not be used as the sole basis for treatment or other patient management decisions. Negative results must be combined with clinical observations, patient history, and epidemiological information. The expected result is Negative.  Fact Sheet for Patients: SugarRoll.be  Fact Sheet for Healthcare Providers: https://www.woods-mathews.com/  This test is not yet approved or cleared by the [place] FDA and  has been authorized for detection and/or diagnosis of SARS-CoV-2 by FDA under an Emergency Use Authorization (EUA). This EUA will remain  in effect (meaning this test can be used) for the duration of the COVID-19 declaration under Se ction 564(b)(1) of the Act, 21 U.S.C. section 360bbb-3(b)(1), unless the authorization is terminated or revoked sooner.  Performed at [facility], [place] [address] [place], [place] [address]      Labs: Basic Metabolic Panel: Recent Labs  Lab 11/14/20 1956 11/16/20 1202 11/16/20 1430 11/16/20 2112 11/17/20 0551 11/17/20 0808 11/18/20 0548  NA 141 140  --  141 141  --  142  K 2.5* 2.6*  --  3.3* 3.5  --  3.7  CL 108 108  --  110 111  --  112*  CO2 24 24  --  22 22  --  23  GLUCOSE  150* 102*  --  134* 90  --  99  BUN 35* 39*  --  35* 33*  --  36*  CREATININE 2.33* 2.21*  --  2.17* 2.08*  --  2.19*  CALCIUM 6.8* 6.8*  --  6.7* 7.0*  --  7.0*  MG  --   --  1.4*  --   --  2.1  --   PHOS  --   --   --   --   --  3.4  --    Liver Function Tests: Recent Labs  Lab 11/14/20 1956 11/16/20 1202  AST 23 22  ALT 17 18  ALKPHOS 82 83  BILITOT 0.3 0.4  PROT 4.9* 5.2*  ALBUMIN 1.4* 1.7*   CBC: Recent Labs  Lab 11/14/20 1956 11/16/20 1202 11/16/20 2112 11/17/20 0551 11/18/20 0548  WBC 8.5 8.5  --  7.5 7.4  NEUTROABS 6.3  --   --   --   --   HGB 7.1* 7.6* 9.0* 9.6* 8.7*  HCT 23.0* 24.0* 28.5* 29.6* 27.2*  MCV 98.3 98.4  --  95.2 93.8  PLT 303 286  --  145* 230    Recent Labs    09/15/20 1650 11/16/20 2112  BNP 223.8* 272.9*    Principal Problem:   Failure to thrive in adult Active Problems:   Electrolyte abnormality   Benign essential hypertension   Hyperlipidemia LDL goal <100   Hypothyroid   Lower GI bleed   Aortic atherosclerosis (HCC)   Cellulitis   AKI (acute kidney injury) (HCC)   Hypomagnesemia   Acute on chronic diastolic CHF (congestive heart failure) (HCC)  Pressure injury of skin   Symptomatic anemia   CKD (chronic kidney disease), stage III ([place])   Time coordinating discharge: 35 minutes  Signed:  Murray Hodgkins, MD  [facility]  11/19/2020, 12:42 PM

## 2020-11-19 NOTE — Hospital Course (Signed)
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  Admitted for failure to thrive multifactorial with electrolyte abnormalities, anemia.  Multifactorial failure to thrive including hypokalemia, hypomagnesemia, AKI, generalized debility, anemia, severe protein calorie malnutrition --   Acute on chronic anemia, symptomatic anemia likely secondary to chronic GI blood loss with known high-grade colonic dysplasia and sigmoid stricture. Patient refused surgery. --Post 1 unit PRBC --Continue PPI --Aspirin was recently discontinued by PCP  Purulent skin tear left hand, right ankle cellulitis present on admission --Doxycycline --Left upper extremity edema --wound care: Gently clean both wounds with soap and water, pat dry. Apply Bactroban and cover with petrolatum based gauze and 4 x 4 and wrap with kerlix to avoid tape on the skin. Change daily. Monitor the wound area(s) for worsening of  condition such as: Signs/symptoms of infection, increase in size, development of or worsening of odor, development of pain, or increased pain at the affected locations.    Acute kidney injury superimposed on CKD stage IIIa --Likely from poor oral intake --Some initial concern for cardiorenal component given volume overload on exam and edema on chest x-ray --Treated with low-dose Lasix --Losartan recently discontinued by PCP  Acute on chronic diastolic CHF --   Generalized debility --HH PT recommended but patient refused  Hypothyroidism --TSH w/ trivial elevation. Continue Synthroid w/o change.

## 2020-11-19 NOTE — TOC Transition Note (Signed)
Transition of Care [facility]) - CM/SW Discharge Note   Patient Details  Name: Tammy Allison MRN: [id number] Date of Birth: 1935-07-21  [facility]) CM/SW Contact:  Bartholome Bill, RN Phone Number: 11/19/2020, 2:40 PM   Clinical Narrative:    Pt to dc home with [facility] hospice today. [facility] contacted for transport. Yellow DNR on chart for DC. RN to call pt daughter Stanton Kidney when Sharin Mons arrives to room.   Final next level of care: Home w Hospice Care Barriers to Discharge: No Barriers Identified   Patient Goals and CMS Choice Patient states their goals for this hospitalization and ongoing recovery are:: to go home with hospice CMS Medicare.gov Compare Post Acute Care list provided to:: Patient Represenative (must comment) Choice offered to / list presented to : [facility] POA / Guardian,Adult Children    Discharge Plan and Services   Discharge Planning Services: CM Consult Post Acute Care Choice: Hospice             Social Determinants of Health (SDOH) Interventions     Readmission Risk Interventions Readmission Risk Prevention Plan 11/19/2020 09/19/2020  Transportation Screening Complete Complete  PCP or Specialist Appt within 5-7 Days - Complete  PCP or Specialist Appt within 3-5 Days Complete -  Home Care Screening - Complete  Medication Review (RN CM) - Complete  HRI or Home Care Consult Complete -  Social Work Consult for Recovery Care Planning/Counseling Complete -  Palliative Care Screening Complete -  Medication Review [employer]) Complete -  Some recent data might be hidden

## 2020-11-19 NOTE — Care Management Important Message (Signed)
Important Message  Patient Details IM Letter given to the Patient. Name: Tammy Allison MRN: [id number] Date of Birth: 22-Jan-1935   Medicare Important Message Given:  Yes     Caren Macadam 11/19/2020, 11:39 AM

## 2020-11-26 IMAGING — CR DG CHEST 2V
2 series · 2 of 2 positions shown · non-contrast
Comparison: March 23, 2020

CLINICAL DATA: Atrial fibrillation.

EXAM:
CHEST - 2 VIEW

[chest lat]
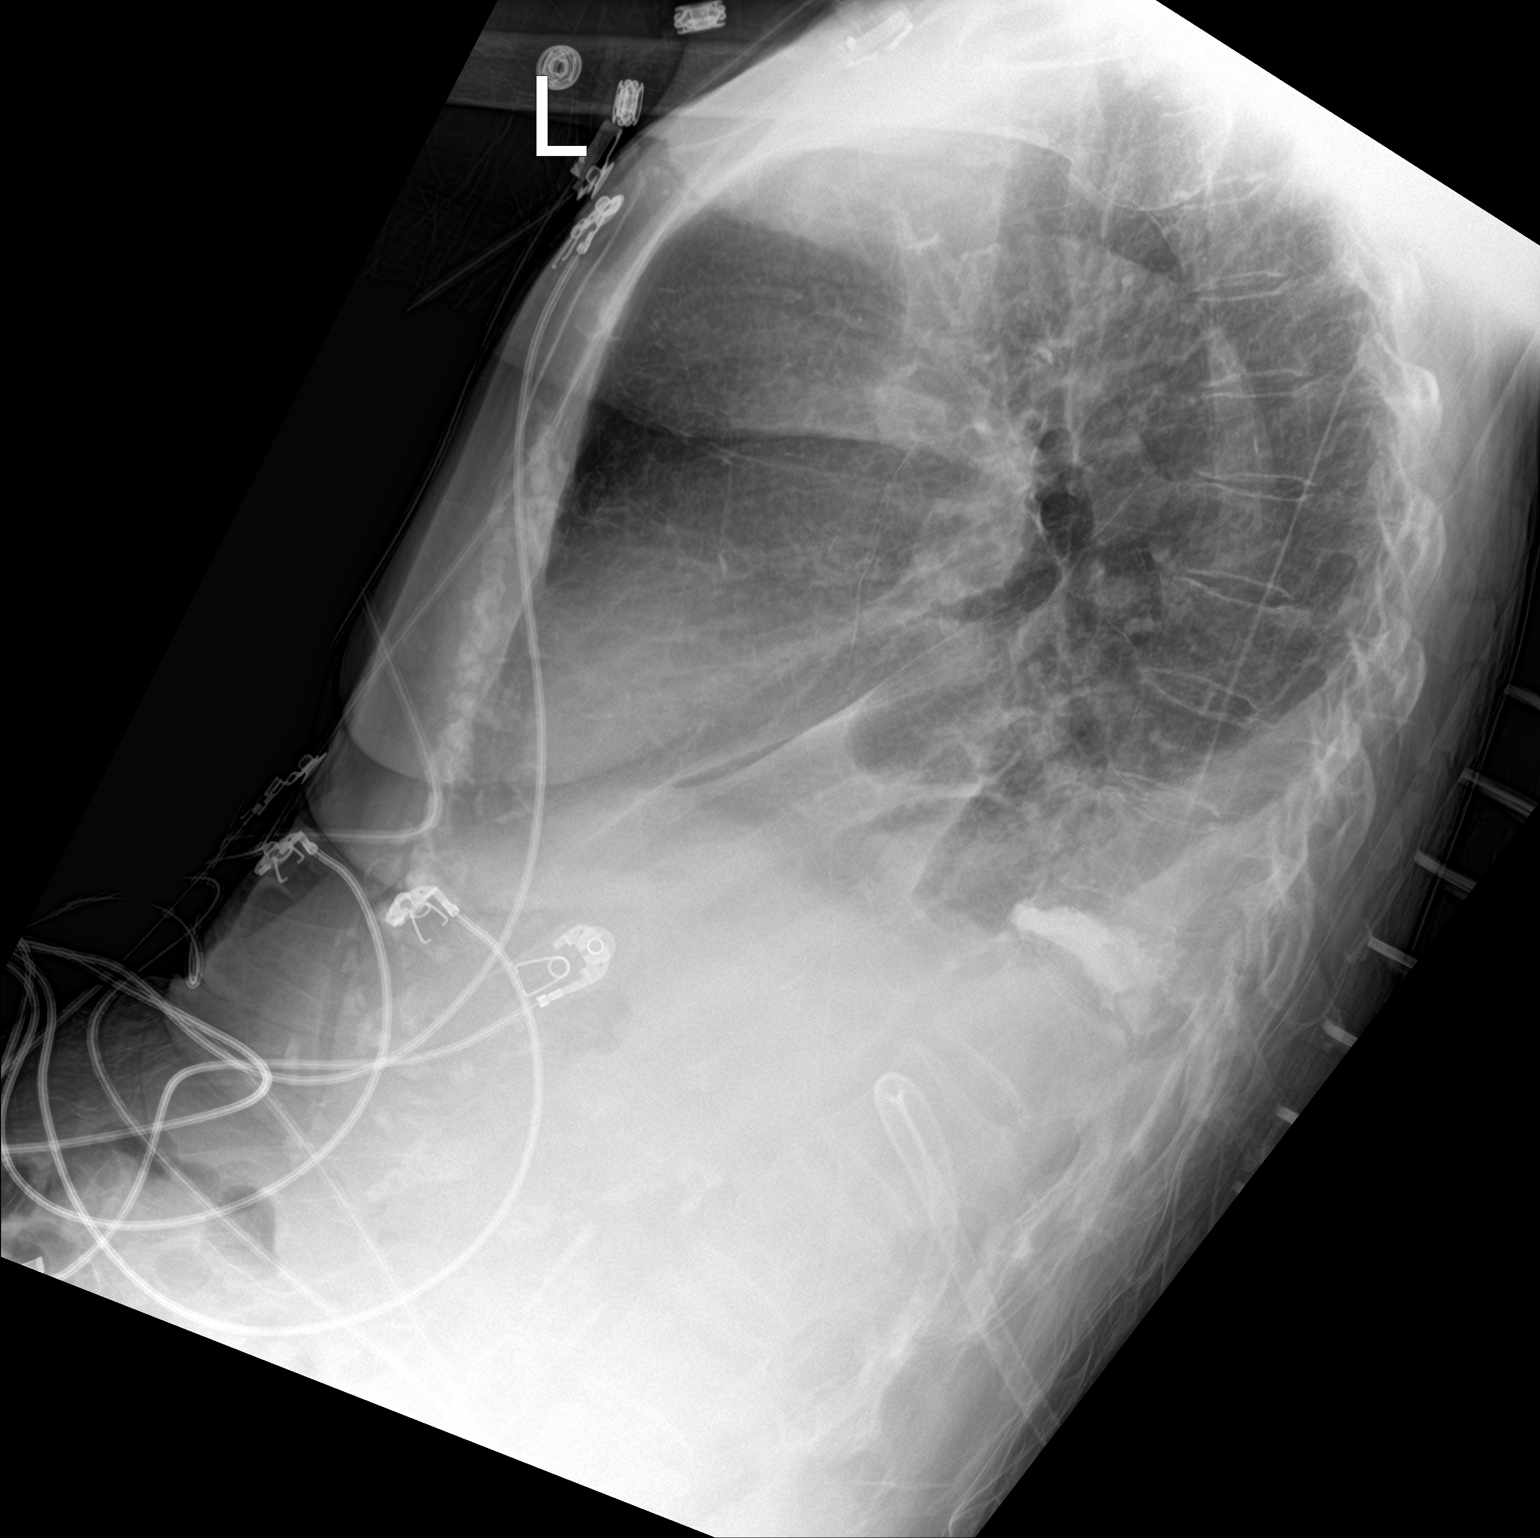

[chest ap]
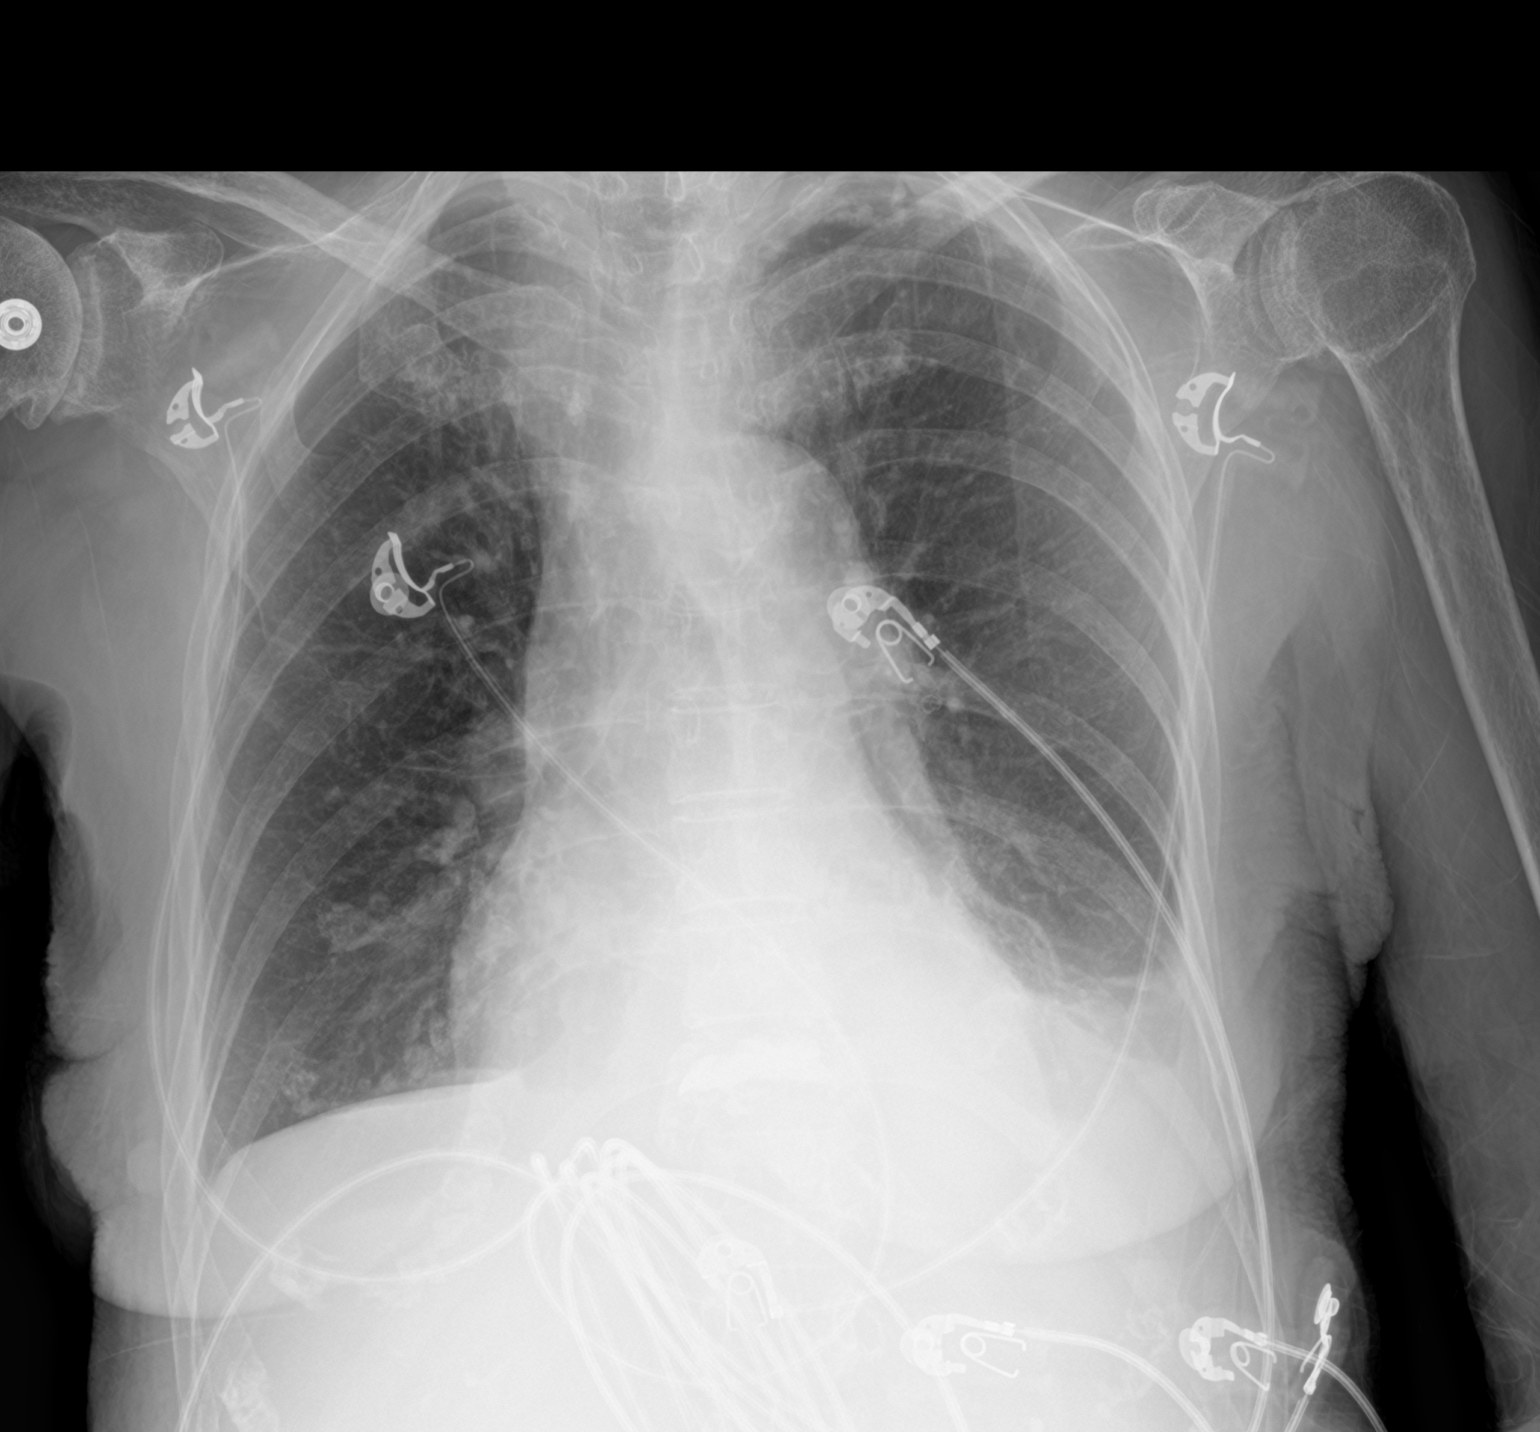

[2 of 2 positions shown; findings below may reference images not displayed]

FINDINGS: Cardiomediastinal silhouette is normal. Mediastinal contours appear
intact.

There is no evidence of focal airspace consolidation, or
pneumothorax. Bilateral moderate pleural effusions, left greater
than right.

Interval vertebroplasty of the lower thoracic spine. Soft tissues
are grossly normal.
IMPRESSION: Bilateral moderate pleural effusions, left greater than right.

## 2020-11-27 IMAGING — DX DG CHEST 1V PORT
1 series · 1 of 1 positions shown · non-contrast
Comparison: 09/15/2020

CLINICAL DATA: Pleural effusion.

EXAM:
PORTABLE CHEST 1 VIEW

[chest]
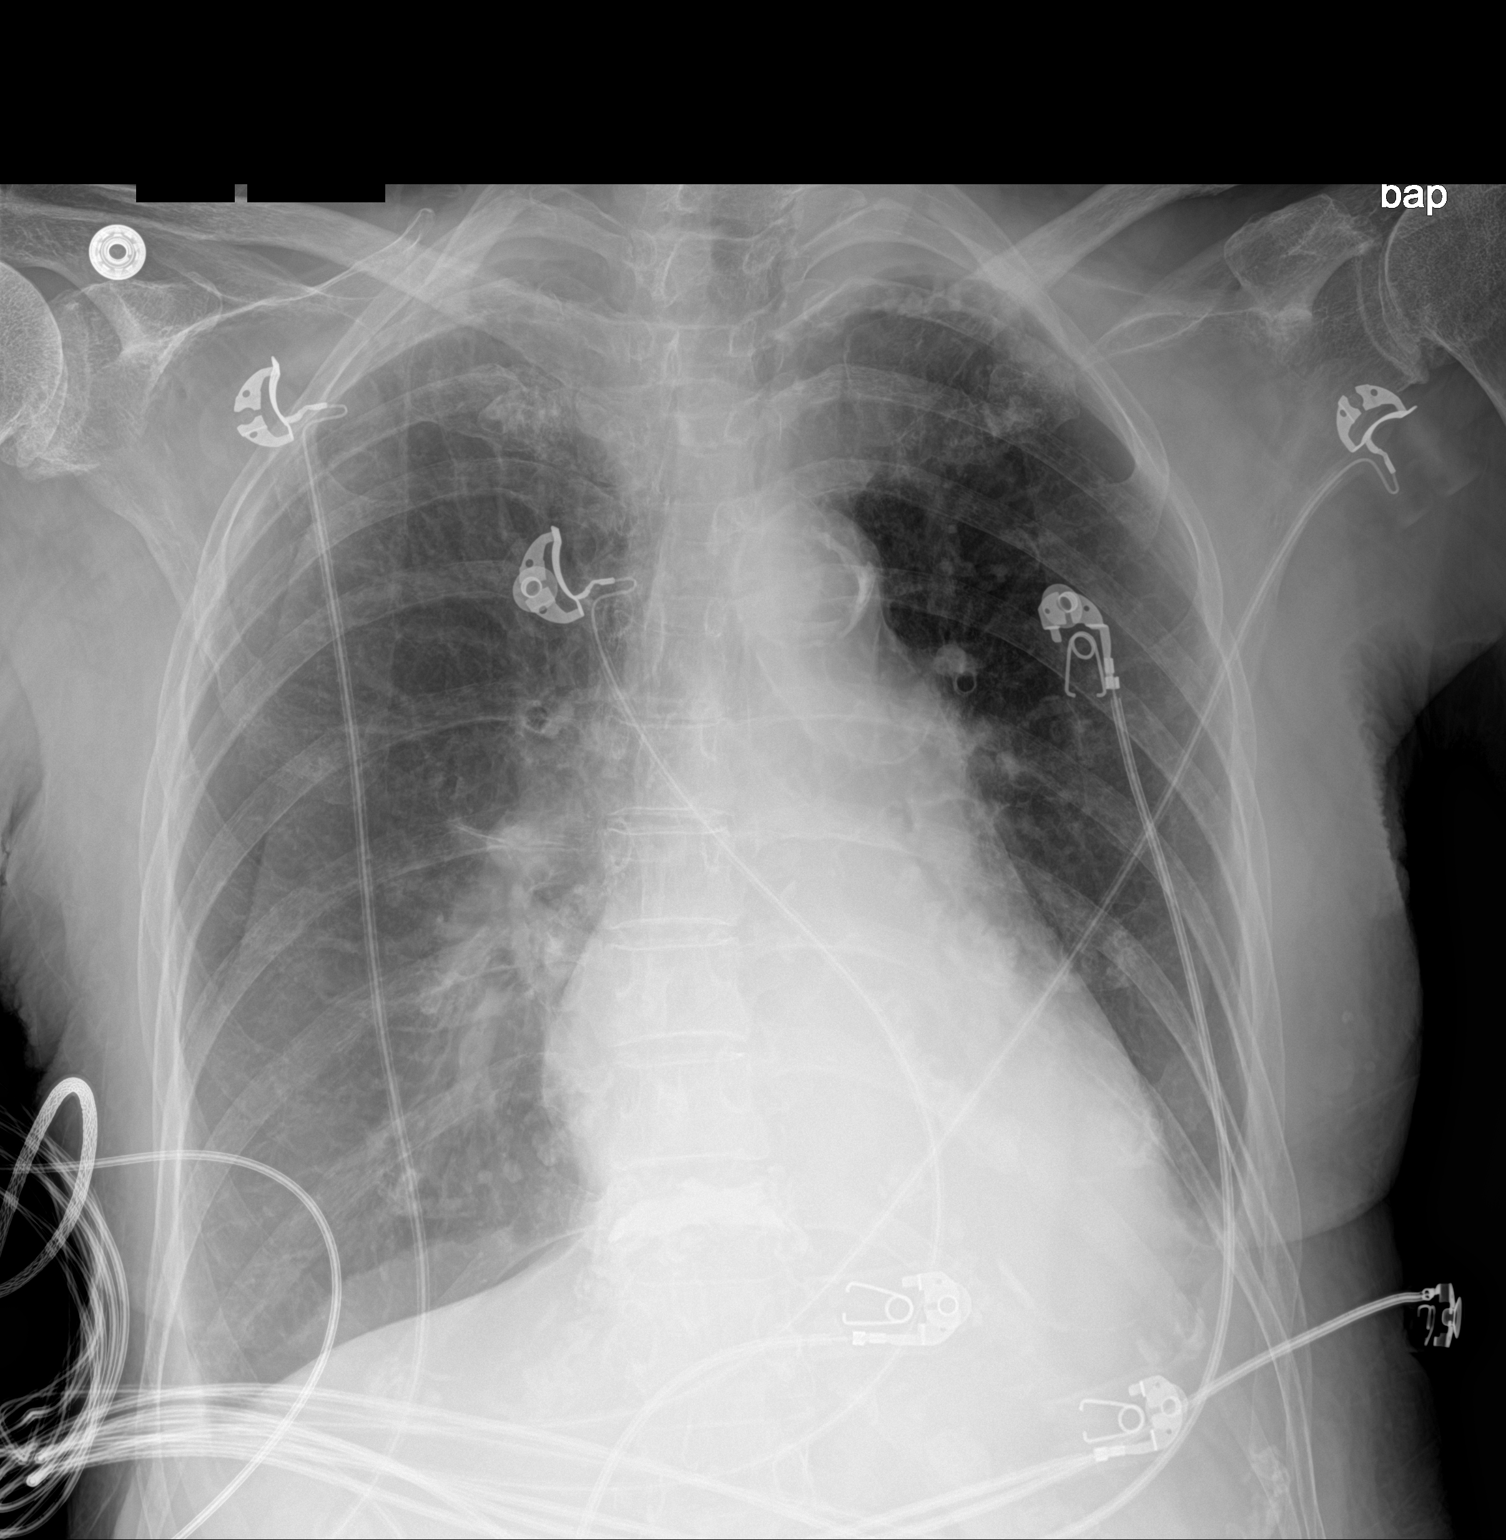

[1 of 1 positions shown; findings below may reference images not displayed]

FINDINGS: Heart is enlarged. Lungs are hyperinflated. LEFT pleural effusion
appears slightly smaller. RIGHT pleural effusion appears stable.
There is persistent dense opacity at the LEFT lung base which
obscures the LEFT hemidiaphragm. There is no pneumothorax. RIGHT
lung is clear.
IMPRESSION: 1. Slight decrease in LEFT pleural effusion.
2. Persistent dense opacity at the LEFT lung base.

## 2020-11-28 IMAGING — CT CT ABD-PELV W/ CM
2 of 5 series · 14 of 46 positions shown, 16 images · IV contrast (APPLIED)
Comparison: Abdominal CT 05/10/2020.  Chest radiograph yesterday.

CLINICAL DATA: 85-year-old with acute abdominal pain. Weight loss.
Anemia.

EXAM:
CT ABDOMEN AND PELVIS WITH CONTRAST
TECHNIQUE: Multidetector CT imaging of the abdomen and pelvis was performed
using the standard protocol following bolus administration of
intravenous contrast.
CONTRAST:  100mL OMNIPAQUE IOHEXOL 300 MG/ML  SOLN

[Series 3: abdomen 5.0 · axial · 0.66mm/px · z∈[-477,-102]mm · 11 of 87 slices shown, 13 images]
[im 6/87  soft-tissue]
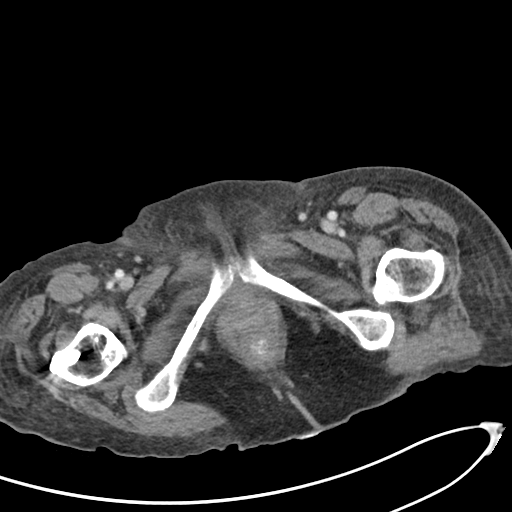
[im 6/87  bone]
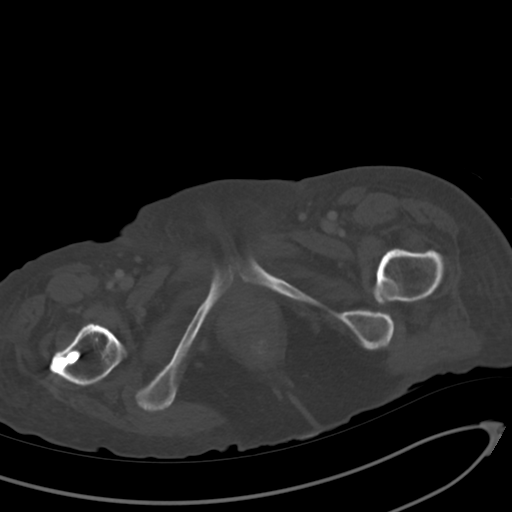
[im 16/87  soft-tissue]
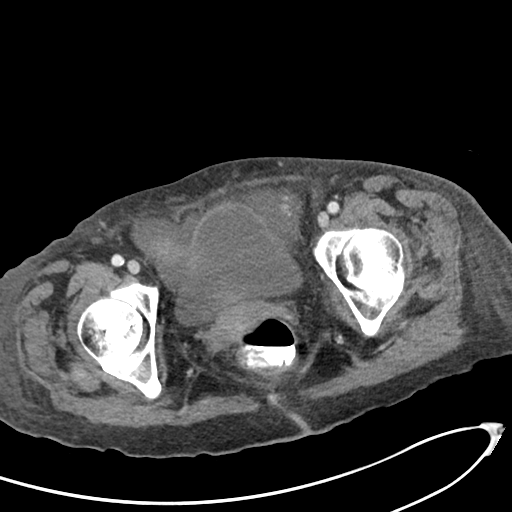
[im 21/87  soft-tissue]
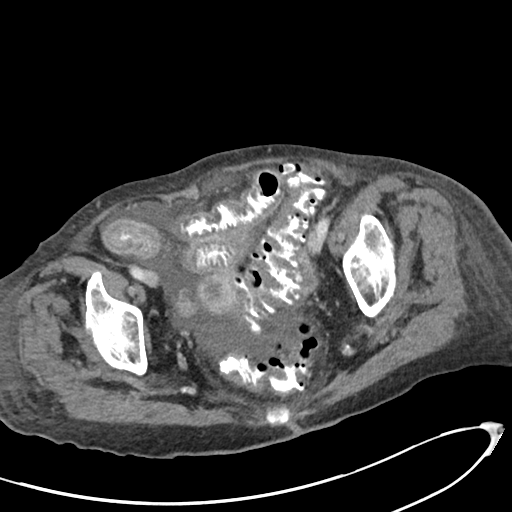
[im 31/87  soft-tissue]
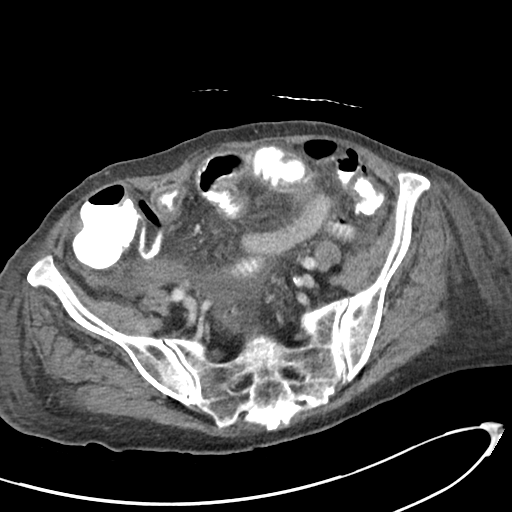
[im 36/87  soft-tissue]
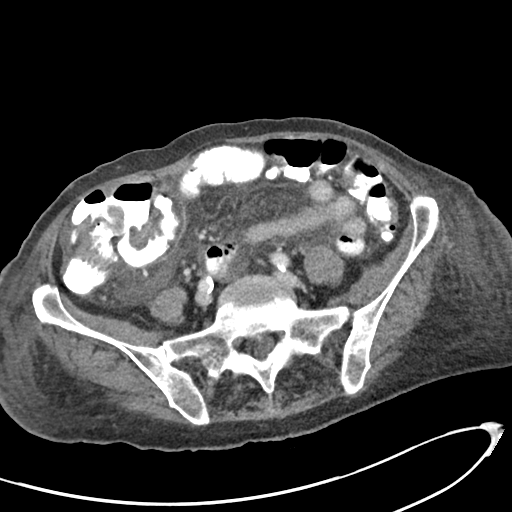
[im 46/87  soft-tissue]
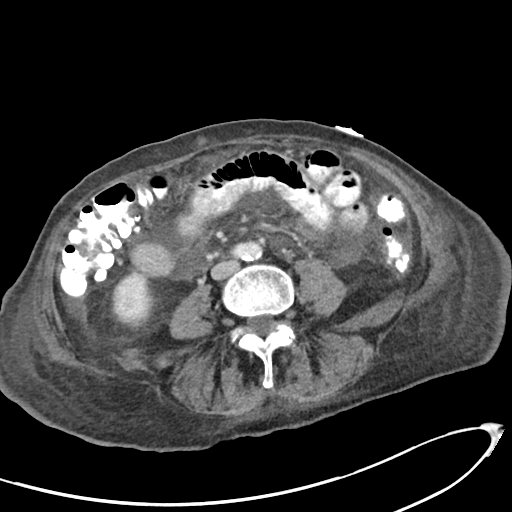
[im 51/87  soft-tissue]
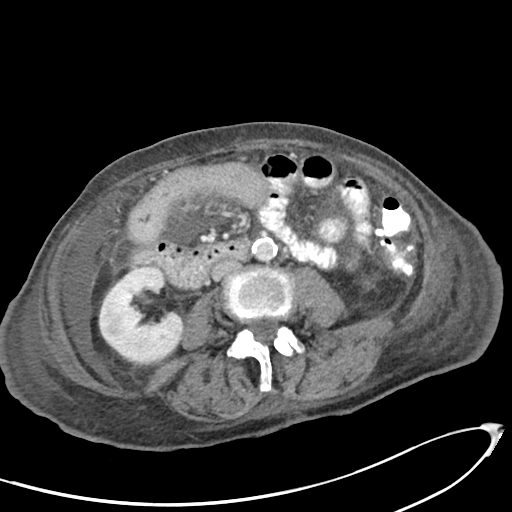
[im 56/87  soft-tissue]
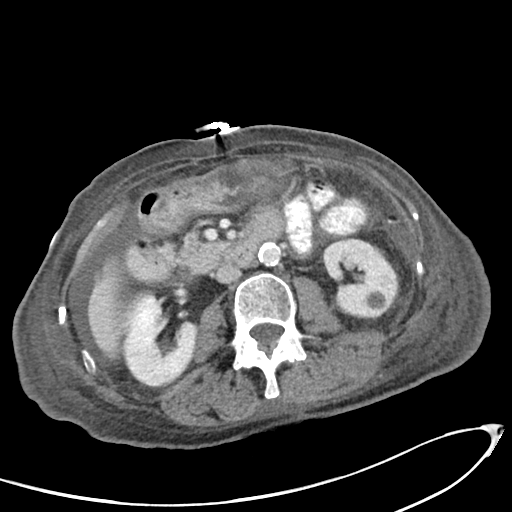
[im 66/87  soft-tissue]
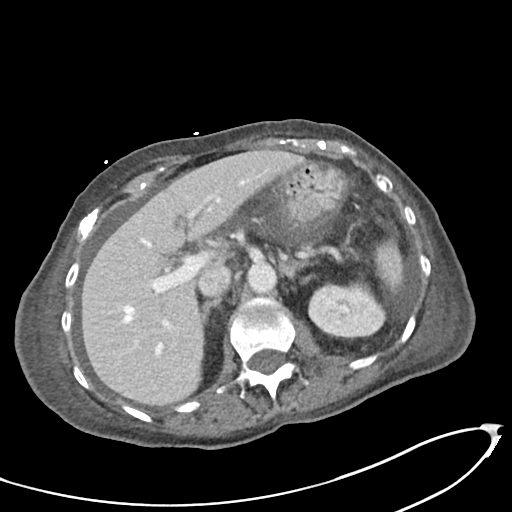
[im 66/87  bone]
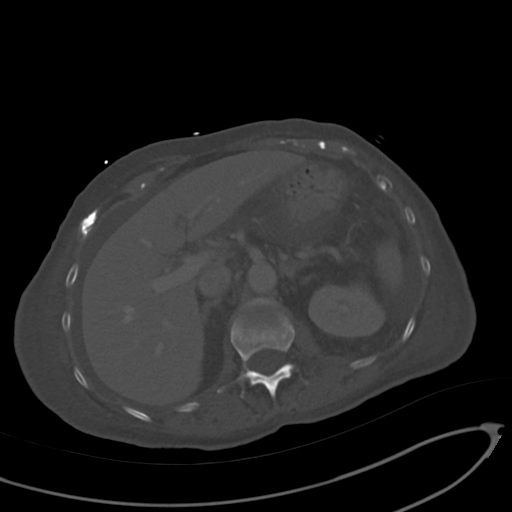
[im 71/87  soft-tissue]
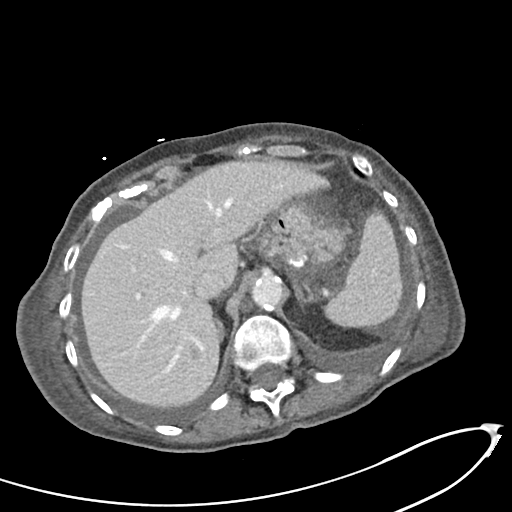
[im 81/87  soft-tissue]
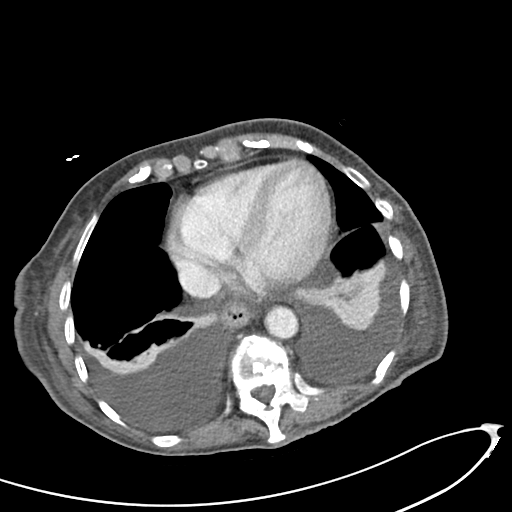

[Series 5: abdomen 3.0 mpr cor · coronal · 0.69mm/px · 3 of 75 slices shown]
[im 25/75  soft-tissue]
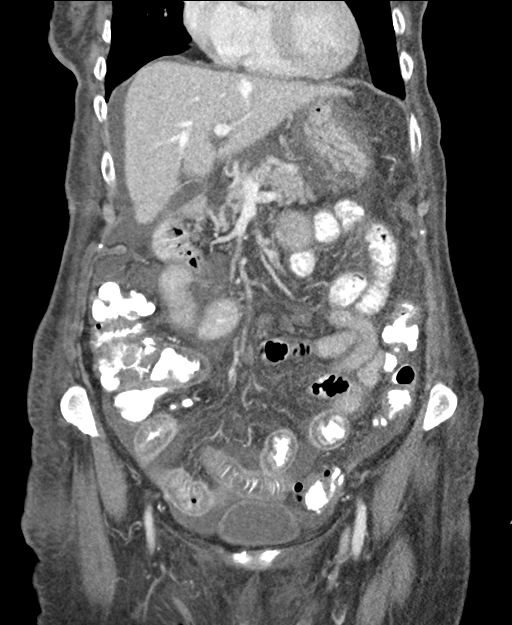
[im 33/75  soft-tissue]
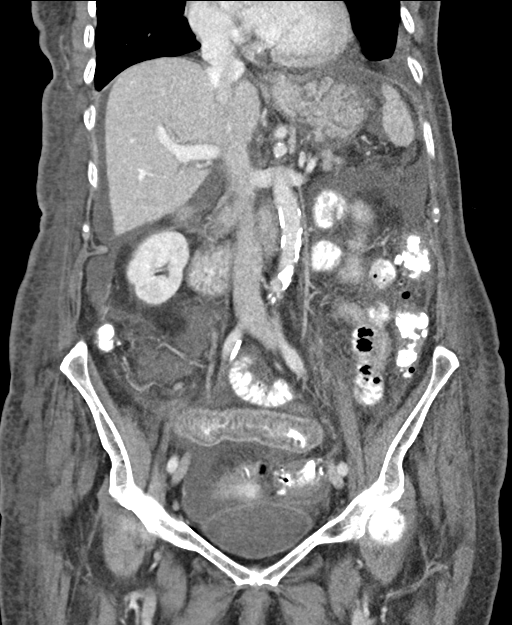
[im 42/75  soft-tissue]
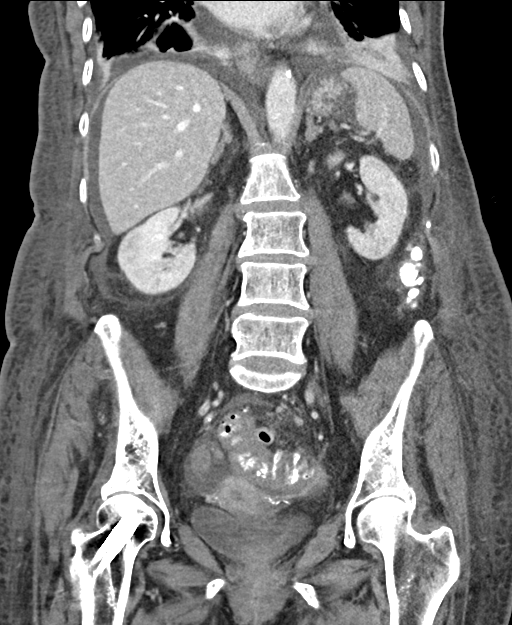

[14 of 46 positions shown; findings below may reference images not displayed]

FINDINGS: Lower chest: Moderate bilateral pleural effusions with compressive
atelectasis. Upper normal heart size. Small to moderate hiatal
hernia.

Hepatobiliary: Stable cysts in the right and left lobe of the liver.
No new hepatic lesion. Partially distended gallbladder. No calcified
gallstone. There is a Phrygian cap. No biliary dilatation.

Pancreas: No ductal dilatation or inflammation. There is a 9 mm cyst
in the pancreatic body, series 3, image 30, it was not definitively
seen on prior.

Spleen: Calcified granuloma.  No suspicious lesion.  Normal in size.

Adrenals/Urinary Tract: Normal adrenal glands. No hydronephrosis or
perinephric edema. Homogeneous renal enhancement with symmetric
excretion on delayed phase imaging. Lower pole left renal cyst again
seen. There is also a low-density lesion in the lower most left
kidney that is too small to characterize. Urinary bladder is
physiologically distended without wall thickening.

Stomach/Bowel: Small to moderate hiatal hernia normal positioning of
the duodenum and ligament of Treitz. There is a moderate length
segment of low densities small bowel wall thickening and edema
involving the pelvic bowel loops extending to the distal ileum. No
obstruction, administered enteric contrast reaches the colon. Normal
appendix filled with enteric contrast. Mild colonic tortuosity.
Multifocal colonic diverticulosis. Mild mural hypertrophy of the
sigmoid but no focally inflamed colonic wall thickening or
pericolonic edema.

Vascular/Lymphatic: Moderate aortic atherosclerosis. No aortic
aneurysm. Mesenteric branch vessels are patent, no evidence of
mesenteric thrombus or embolic disease. No enlarged lymph nodes in
the abdomen or pelvis.

Reproductive: Low-density endometrium measuring 13 mm, endometrial
thickening versus fluid in the endometrial canal, also seen on prior
exam. Enhancing focus involving the left uterine fundus likely a
fibroid. There is no adnexal mass.

Other: Small to moderate volume of ascites/free fluid in the abdomen
and pelvis, including the mesentery. Mild mesenteric edema in the
region of abnormal small bowel. No free air or focal fluid
collection.

Musculoskeletal: T11 compression fracture with vertebral
augmentation, new from prior exam. 2 screws traverse the right
femoral neck. No acute osseous abnormalities.
IMPRESSION: 1. Moderate length segment of low-density small bowel wall
thickening and edema involving the pelvic bowel loops extending to
the distal ileum. Differential considerations include infectious or
inflammatory enteritis or bowel angioedema. Small-to-moderate volume
of ascites/free fluid in the abdomen and pelvis. No perforation or
abscess.
2. Moderate bilateral pleural effusions with compressive
atelectasis.
3. Low-density endometrium measuring 13 mm, endometrial thickening
versus fluid in the endometrial canal, also seen on prior exam. This
is abnormal in a postmenopausal patient. Recommend nonemergent
pelvic ultrasound for characterization on an elective outpatient
basis after resolution of acute event.
4. Colonic diverticulosis without diverticulitis.
5. Small to moderate hiatal hernia.
6. A 9 mm cyst in the pancreatic body was not definitively seen on
prior exam. Recommend follow up pre and post contrast MRI/MRCP or
pancreatic protocol CT in 2 years. This recommendation follows ACR
consensus guidelines: Management of Incidental Pancreatic Cysts: A
White Paper of the ACR Incidental Findings Committee. [HOSPITAL] 2422;[DATE].

Aortic Atherosclerosis (2WSNM-5OL.L).

## 2020-11-30 ENCOUNTER — Other Ambulatory Visit: Payer: Medicare Other

## 2020-12-18 DEATH — deceased

## 2021-02-12 ENCOUNTER — Ambulatory Visit: Payer: Medicare Other | Admitting: Adult Health
